# Patient Record
Sex: Male | Born: 1940 | Race: White | Hispanic: No | Marital: Married | Smoking: Former smoker
Health system: Southern US, Community
[De-identification: ages and names within clinical notes are randomized; demographics above are authoritative.]

## PROBLEM LIST (undated history)

## (undated) DIAGNOSIS — Z9189 Other specified personal risk factors, not elsewhere classified: Secondary | ICD-10-CM

## (undated) DIAGNOSIS — I10 Essential (primary) hypertension: Secondary | ICD-10-CM

## (undated) DIAGNOSIS — I2510 Atherosclerotic heart disease of native coronary artery without angina pectoris: Secondary | ICD-10-CM

## (undated) DIAGNOSIS — M1990 Unspecified osteoarthritis, unspecified site: Secondary | ICD-10-CM

## (undated) DIAGNOSIS — R350 Frequency of micturition: Secondary | ICD-10-CM

## (undated) DIAGNOSIS — R351 Nocturia: Secondary | ICD-10-CM

## (undated) DIAGNOSIS — E785 Hyperlipidemia, unspecified: Secondary | ICD-10-CM

## (undated) DIAGNOSIS — N400 Enlarged prostate without lower urinary tract symptoms: Secondary | ICD-10-CM

## (undated) DIAGNOSIS — Z87442 Personal history of urinary calculi: Secondary | ICD-10-CM

## (undated) DIAGNOSIS — I499 Cardiac arrhythmia, unspecified: Secondary | ICD-10-CM

## (undated) DIAGNOSIS — I639 Cerebral infarction, unspecified: Secondary | ICD-10-CM

## (undated) DIAGNOSIS — N4340 Spermatocele of epididymis, unspecified: Secondary | ICD-10-CM

## (undated) DIAGNOSIS — R972 Elevated prostate specific antigen [PSA]: Secondary | ICD-10-CM

## (undated) DIAGNOSIS — R42 Dizziness and giddiness: Secondary | ICD-10-CM

## (undated) DIAGNOSIS — R3915 Urgency of urination: Secondary | ICD-10-CM

## (undated) HISTORY — PX: EYE SURGERY: SHX253

## (undated) HISTORY — PX: INGUINAL HERNIA REPAIR: SUR1180

## (undated) HISTORY — DX: Cerebral infarction, unspecified: I63.9

## (undated) HISTORY — DX: Atherosclerotic heart disease of native coronary artery without angina pectoris: I25.10

## (undated) HISTORY — PX: CORONARY ANGIOPLASTY: SHX604

## (undated) HISTORY — DX: Essential (primary) hypertension: I10

## (undated) HISTORY — PX: FOOT FUSION: SHX956

---

## 1999-12-25 ENCOUNTER — Emergency Department (HOSPITAL_COMMUNITY): Admission: EM | Admit: 1999-12-25 | Discharge: 1999-12-25 | Payer: Self-pay

## 1999-12-25 ENCOUNTER — Encounter: Payer: Self-pay | Admitting: Emergency Medicine

## 2000-01-03 ENCOUNTER — Emergency Department (HOSPITAL_COMMUNITY): Admission: EM | Admit: 2000-01-03 | Discharge: 2000-01-03 | Payer: Self-pay | Admitting: Emergency Medicine

## 2001-09-19 ENCOUNTER — Emergency Department (HOSPITAL_COMMUNITY): Admission: EM | Admit: 2001-09-19 | Discharge: 2001-09-19 | Payer: Self-pay | Admitting: *Deleted

## 2002-08-18 HISTORY — PX: SHOULDER ARTHROSCOPY: SHX128

## 2008-06-02 ENCOUNTER — Ambulatory Visit (HOSPITAL_BASED_OUTPATIENT_CLINIC_OR_DEPARTMENT_OTHER): Admission: RE | Admit: 2008-06-02 | Discharge: 2008-06-02 | Payer: Self-pay | Admitting: General Surgery

## 2008-06-02 HISTORY — PX: UMBILICAL HERNIA REPAIR: SHX196

## 2010-05-02 ENCOUNTER — Ambulatory Visit: Payer: Self-pay | Admitting: Cardiology

## 2010-11-04 ENCOUNTER — Ambulatory Visit: Payer: Self-pay | Admitting: Cardiology

## 2010-11-20 ENCOUNTER — Other Ambulatory Visit: Payer: Self-pay | Admitting: Cardiology

## 2010-11-20 ENCOUNTER — Ambulatory Visit: Payer: Self-pay | Admitting: Cardiology

## 2010-11-20 DIAGNOSIS — I10 Essential (primary) hypertension: Secondary | ICD-10-CM

## 2010-11-20 MED ORDER — AMLODIPINE BESYLATE 10 MG PO TABS: 10.0000 mg | ORAL_TABLET | Freq: Every day | ORAL | Status: DC

## 2010-11-20 NOTE — Telephone Encounter (Signed)
Requesting Amlodipine 10 mg #90 be sent to [facility].

## 2010-11-20 NOTE — Telephone Encounter (Signed)
Called requesting refill on amlodipine sent to [facility]; Faxed

## 2010-12-04 ENCOUNTER — Ambulatory Visit: Payer: Self-pay | Admitting: Cardiology

## 2010-12-18 ENCOUNTER — Ambulatory Visit: Payer: Self-pay | Admitting: Cardiology

## 2010-12-18 ENCOUNTER — Encounter: Payer: Self-pay | Admitting: Cardiology

## 2010-12-18 DIAGNOSIS — I10 Essential (primary) hypertension: Secondary | ICD-10-CM | POA: Insufficient documentation

## 2010-12-18 DIAGNOSIS — I2510 Atherosclerotic heart disease of native coronary artery without angina pectoris: Secondary | ICD-10-CM | POA: Insufficient documentation

## 2010-12-18 DIAGNOSIS — I300 Acute nonspecific idiopathic pericarditis: Secondary | ICD-10-CM | POA: Insufficient documentation

## 2010-12-18 DIAGNOSIS — E7800 Pure hypercholesterolemia, unspecified: Secondary | ICD-10-CM | POA: Insufficient documentation

## 2010-12-18 DIAGNOSIS — R42 Dizziness and giddiness: Secondary | ICD-10-CM | POA: Insufficient documentation

## 2010-12-25 ENCOUNTER — Ambulatory Visit (INDEPENDENT_AMBULATORY_CARE_PROVIDER_SITE_OTHER): Payer: 59 | Admitting: Cardiology

## 2010-12-25 ENCOUNTER — Encounter: Payer: Self-pay | Admitting: Cardiology

## 2010-12-25 VITALS — BP 128/78 | HR 60 | Ht 67.0 in | Wt 170.6 lb

## 2010-12-25 DIAGNOSIS — I2510 Atherosclerotic heart disease of native coronary artery without angina pectoris: Secondary | ICD-10-CM

## 2010-12-25 DIAGNOSIS — E7800 Pure hypercholesterolemia, unspecified: Secondary | ICD-10-CM

## 2010-12-25 DIAGNOSIS — I10 Essential (primary) hypertension: Secondary | ICD-10-CM

## 2010-12-25 NOTE — Assessment & Plan Note (Signed)
His last lipid panel in September was good. Will continue with his Crestor. Will repeat lab work in 6 months.

## 2010-12-25 NOTE — Assessment & Plan Note (Signed)
Blood pressure control is acceptable. We will continue with his current therapy. Continue sodium restriction.

## 2010-12-25 NOTE — Progress Notes (Signed)
   Edward [name] Date of Birth: 10-25-1940   History of Present Illness: Edward [name] is seen for followup today. He continues to do very well. He's had no significant chest pain or shortness of breath. He denies any palpitations. He still has occasional bouts of vertigo that come and go.  Current Outpatient Prescriptions on File Prior to Visit  Medication Sig Dispense Refill  . amLODipine (NORVASC) 10 MG tablet Take 1 tablet (10 mg total) by mouth daily.  90 tablet  3  . aspirin 325 MG tablet Take 325 mg by mouth daily.        Marland Kitchen atenolol (TENORMIN) 50 MG tablet Take 25 mg by mouth 2 (two) times daily.        . fish oil-omega-3 fatty acids 1000 MG capsule Take 1,000 mg by mouth 2 (two) times daily. 2 BID        . lisinopril (PRINIVIL,ZESTRIL) 40 MG tablet Take 20 mg by mouth 2 (two) times daily.        . rosuvastatin (CRESTOR) 10 MG tablet Take 10 mg by mouth daily.          No Known Allergies  Past Medical History  Diagnosis Date  . Coronary artery disease 1995    ANGIOPLASTY THE LAD  . Acute idiopathic pericarditis   . Hypercholesterolemia   . Hypertension   . Vertigo, intermittent     Past Surgical History  Procedure Date  . Hernia repair   . Shoulder arthroscopy     right    History  Smoking status  . Former Smoker  . Quit date: 12/18/1970  Smokeless tobacco  . Not on file    History  Alcohol Use No    No family history on file.  Review of Systems: The review of systems is positive for left shoulder and neck pain. He reports that he may need left shoulder surgery. All other systems were reviewed and are negative.  Physical Exam: BP 128/78  Pulse 60  Ht 5\' 7"  (1.702 m)  Wt 170 lb 9.6 oz (77.384 kg)  BMI 26.72 kg/m2 He is an overweight white male in no acute distress. His HEENT exam is unremarkable. He has no JVD or bruits. Lungs are clear. Cardiac exam reveals a regular rate and rhythm without gallop or murmur. Abdomen is soft and nontender. He has no edema.  Pedal pulses are good. LABORATORY DATA:   Assessment / Plan:

## 2010-12-25 NOTE — Patient Instructions (Addendum)
Stay on same medications.  Stay active and lose a little weight.   We will see you back in 6 months.  We will schedule you for a Stress Echo.

## 2010-12-25 NOTE — Assessment & Plan Note (Signed)
He is asymptomatic. His last stress test was in October of 2008. I recommended a stress echo for followup evaluation. Continue risk factor modification.

## 2010-12-31 NOTE — Op Note (Signed)
NAME:  Edward [name], Edward [name] NO.:  [id number]   MEDICAL RECORD NO.:  [id number]          PATIENT TYPE:  AMB   LOCATION:  DSC                          FACILITY:  [facility]   PHYSICIAN:  Juanetta Gosling, MDDATE OF BIRTH:  06-22-1941   DATE OF PROCEDURE:  06/02/2008  DATE OF DISCHARGE:                               OPERATIVE REPORT   PREOPERATIVE DIAGNOSIS:  Umbilical hernia.   POSTOPERATIVE DIAGNOSIS:  Umbilical hernia.   PROCEDURE:  Primary umbilical hernia repair.   SURGEON:  Troy Sine. Dwain Sarna, MD   ASSISTANT:  None.   ANESTHESIA:  General with LMA.   FINDINGS:  Less than 1 cm defect.   SPECIMEN:  None.   DRAINS:  None.   COMPLICATIONS:  None.   ESTIMATED BLOOD LOSS:  Minimal.   DISPOSITION:  To PACU in stable condition.   HISTORY:  Mr. Birkeland is a 70 year old male with a symptomatic umbilical  hernia who I counseled for an open repair.   PROCEDURE:  After informed consent was obtained, the patient was taken  to the operating room.  He was administered 1 g of intravenous Ancef.  Sequential compression devices were placed on his lower extremities  throughout the operation.  He was then placed under general anesthesia  with an LMA.  His abdomen was then prepped and draped in the standard  sterile surgical fashion.  A surgical time-out was then performed.  A  curvilinear infraumbilical incision was then made and dissection was  carried out down to the level of the umbilical stalk.  This was then  encircled with a Kelly clamp and divided with electrocautery.  When the  hernia was completely reduced, it was noted to have a less than 1 cm  defect.  He had a fair amount of preperitoneal fat protruding through  this which was why I think he was so symptomatic.  Upon reduction of  this, the edges were then cleaned off with electrocautery.  2 0-Ethibond  figure-of-eight sutures and then 1 simple suture through the middle were  then performed and tied down.   There was no further defect noted.  This  was closed without any tension.  Hemostasis was observed.  The umbilicus  was then tacked down with a 4-0 undyed Vicryl.  Skin was closed with a 4-  0 Monocryl.  Steri-Strips and sterile dressing were placed over this.  A 10 mL of  0.25% Marcaine without epinephrine were then placed into the wound  following completion.  He tolerated this well, was extubated in the  operating room, and was then transferred to PACU in stable condition.      Juanetta Gosling, MD  Electronically Signed     MCW/MEDQ  D:  06/02/2008  T:  06/03/2008  Job:  [id number]   cc:   Larina Earthly, M.D.  Peter M. [place], M.D.

## 2011-01-01 ENCOUNTER — Other Ambulatory Visit (HOSPITAL_COMMUNITY): Payer: 59 | Admitting: Radiology

## 2011-01-03 NOTE — Consult Note (Signed)
[facility]  Patient:    Edward [name], Edward [name] Visit Number: [id number] MRN: [id number]          Service Type: EMS Location: [facility] Attending Physician:  Corlis Leak. Dictated by:   Alvia Grove., M.D. Proc. Date: 09/19/01 Admit Date:  09/19/2001   CC:         Peter M. [place], M.D.  Lenon Curt Cassell Clement, M.D.   Consultation Report  HISTORY OF PRESENT ILLNESS:  Edward [name] is a middle-aged gentleman with a history of coronary artery disease, status post PTCA and stenting of his left anterior descending artery.  He presented to the emergency room with episodes of indigestion and chest pain.  The patient has a history of coronary artery disease with PTCA and stenting of his LAD back in 1994.  He had a repeat heart catheterization in 1999 for episodes of chest pain and was found to have relatively smooth and normal coronaries at that time.  He has done quite well.  In the past week or so, he has been helping his sister paint her house.  He has had some episodes of indigestion.  He has not had any diaphoresis and no dyspnea.  He states that these episodes of chest pain were completely different from his previous episodes of angina.  He has also noted that his blood pressure was slightly higher today.  The pain lasted for approximately 30 minutes.  It was finally relieved with a GI cocktail here in the emergency room.  He has not had any chest pain or chest tightness.  He has not had any associated radiation.  He has not had any shortness of breath or nausea or vomiting.  CURRENT MEDICATIONS: 1. Accupril 20 mg a day. 2. Zocor 40 mg a day. 3. Atenolol 25 mg a day. 4. Nitroglycerin as needed.  ALLERGIES:  No known drug allergies.  PAST MEDICAL HISTORY: 1. Coronary artery disease. 2. Hypertension.  SOCIAL HISTORY:  The patient has a history of smoking in the remote past.  He drinks alcohol only rarely.  FAMILY HISTORY:  Positive  for coronary artery disease.  REVIEW OF SYSTEMS:  Was reviewed and is essentially negative.  PHYSICAL EXAMINATION:  GENERAL:  This is a middle-aged gentleman in no acute distress.  VITAL SIGNS:  His initial blood pressure was 165/104; later it was 165/90. Heart rate is 76.  NECK:  Examination reveals 2+ carotids.  He has no bruits.  There is no JVD.  LUNGS:  Clear to auscultation.  HEART:  Regular rate, S1, S2.  His PMI is nondisplaced.  ABDOMEN:  Good bowel sounds.  He has no hepatosplenomegaly and no areas of tenderness.  EXTREMITIES:  He has no calf tenderness.   There is no clubbing, cyanosis, or edema.  His pulses are intact.  NEUROLOGIC:  Cranial nerves II-XII intact.  His motor and sensory functions are intact.  LABORATORY DATA:  His EKG reveals normal sinus rhythm.  He has no ST or T wave changes.  Laboratory data is pending.  IMPRESSION:  Edward [name] presents with episode of indigestion that is relieve with a gastrointestinal cocktail.  He states that these episodes of pain were not at all similar to his previous episodes of chest pain.  We will discharge him to home.  He will call the office tomorrow morning for an appointment.  I have asked him to call me right away if he has any further episodes of pain. We will give  him a prescription for Prevacid 30 mg which he is to take tonight and then daily.  I have given him a refill for his nitroglycerin.  I have asked him to increase his Accupril up to 40 mg a day.  He has a prescription for Lisinopril which he will be substituting as soon as he runs out of his Accupril.  He will return to the office to see Dr. Swaziland this week. Dictated by:   Alvia Grove., M.D. Attending Physician:  Corlis Leak DD:  09/19/01 TD:  09/20/01 Job: [id number]

## 2011-01-17 ENCOUNTER — Ambulatory Visit (HOSPITAL_COMMUNITY): Payer: Medicare Other | Attending: Cardiology | Admitting: Radiology

## 2011-01-17 ENCOUNTER — Other Ambulatory Visit (HOSPITAL_COMMUNITY): Payer: 59 | Admitting: Radiology

## 2011-01-17 DIAGNOSIS — I2510 Atherosclerotic heart disease of native coronary artery without angina pectoris: Secondary | ICD-10-CM | POA: Insufficient documentation

## 2011-01-20 ENCOUNTER — Telehealth: Payer: Self-pay | Admitting: *Deleted

## 2011-01-20 NOTE — Telephone Encounter (Signed)
Notified of stress echo results. Will send copy to Dr. Felipa Eth

## 2011-01-20 NOTE — Telephone Encounter (Signed)
Message copied by Lorayne Bender on Mon Jan 20, 2011  4:01 PM ------      Message from: [place], PETER M      Created: Fri Jan 17, 2011  5:29 PM       Stress Echo is normal. Way to go!

## 2011-02-10 ENCOUNTER — Other Ambulatory Visit: Payer: Self-pay | Admitting: Cardiology

## 2011-02-10 MED ORDER — ROSUVASTATIN CALCIUM 10 MG PO TABS: 10.0000 mg | ORAL_TABLET | Freq: Every day | ORAL | Status: DC

## 2011-02-10 NOTE — Telephone Encounter (Signed)
escribe medication per fax request  

## 2011-02-10 NOTE — Telephone Encounter (Signed)
Called in asking for you to reorder his Crestor. Asked to have the refill faxed to [facility]. I have pulled the chart.

## 2011-04-22 ENCOUNTER — Telehealth: Payer: Self-pay | Admitting: Cardiology

## 2011-04-22 NOTE — Telephone Encounter (Signed)
Call Back Phone#: [phone] Last OV, EKG, STRESS, ECHO °

## 2011-04-23 ENCOUNTER — Telehealth: Payer: Self-pay | Admitting: Cardiology

## 2011-04-23 NOTE — Telephone Encounter (Signed)
Faxed last OV Note, EKG, Stress.  No ECHO available.  Other info faxed today.

## 2011-04-23 NOTE — Telephone Encounter (Signed)
Wanted to let us know he is having shoulder surgery Monday and was advised to stop ASA and Fish Oil 5 days prior. OK per Dr. Swaziland

## 2011-04-23 NOTE — Telephone Encounter (Signed)
Pt states he is scheduled for shoulder surgery on Monday and pt is instructed to temporarily stop taking aspirin and fish oil 4-5 days prior.  Pt just wanted to inform our office.

## 2011-04-28 ENCOUNTER — Ambulatory Visit (HOSPITAL_BASED_OUTPATIENT_CLINIC_OR_DEPARTMENT_OTHER)
Admission: RE | Admit: 2011-04-28 | Discharge: 2011-04-28 | Disposition: A | Discharge status: Home / Self Care | Payer: Medicare Other | Source: Ambulatory Visit | Attending: Orthopedic Surgery | Admitting: Orthopedic Surgery

## 2011-04-28 DIAGNOSIS — M75110 Incomplete rotator cuff tear or rupture of unspecified shoulder, not specified as traumatic: Secondary | ICD-10-CM | POA: Insufficient documentation

## 2011-04-28 DIAGNOSIS — M25819 Other specified joint disorders, unspecified shoulder: Secondary | ICD-10-CM | POA: Insufficient documentation

## 2011-04-28 DIAGNOSIS — M66329 Spontaneous rupture of flexor tendons, unspecified upper arm: Secondary | ICD-10-CM | POA: Insufficient documentation

## 2011-04-28 HISTORY — PX: SHOULDER ARTHROSCOPY WITH DEBRIDEMENT AND BICEP TENDON REPAIR: SHX5690

## 2011-04-28 LAB — POCT HEMOGLOBIN-HEMACUE: Hemoglobin: 14.4 g/dL (ref 13.0–17.0)

## 2011-05-06 NOTE — Op Note (Signed)
NAME:  Edward [name], Edward [name] NO.:  [id number]  MEDICAL RECORD NO.:  [id number]  LOCATION:  [id number]                     FACILITY:  [facility]  PHYSICIAN:  Jones Broom, MD    DATE OF BIRTH:  [birth year]  DATE OF PROCEDURE:  04/28/2011 DATE OF DISCHARGE:  01/17/2011                              OPERATIVE REPORT   PREOPERATIVE DIAGNOSES: 1. Left shoulder rotator cuff tear. 2. Left shoulder impingement.  POSTOPERATIVE DIAGNOSES: 1. Left shoulder irreparable supraspinatus tear. 2. Left shoulder impingement. 3. Partial-thickness tear undersurface subscapularis. 4. Tear of the proximal long head biceps tendon sheath.  PROCEDURES PERFORMED: 1. Left shoulder arthroscopic extensive debridement of irreparable     supraspinatus tear undersurface subscapularis partial tear and     biceps tenotomy for long head biceps tear. 2. Arthroscopic subacromial decompression.  ATTENDING SURGEON:  Jones Broom, MD  ASSISTANT:  None.  ANESTHESIA:  GETA with preoperative interscalene block.  COMPLICATIONS:  None.  DRAINS:  None.  SPECIMENS:  None.  ESTIMATED BLOOD LOSS:  Minimal.  INDICATIONS FOR SURGERY:  The patient is a 70 year old gentleman with several years of left shoulder pain which has failed nonoperative management in the form of injections, exercises, and antiinflammatory medications.  He had significant night pain, keeping him awake, and pain with activity.  He had an MRI which revealed a full-thickness anterior supraspinatus tear with significant retraction and some atrophy.  He failed conservative management and wished to go forward with surgical management.  He understood risks, benefits, alternatives to surgery including but not limited to risk of bleeding, infection, damage to neurovascular structures, risk of this tear would not be repairable, but likely still get significant benefit from surgery.  We talked about preoperatively what to do with his biceps  tendon more significantly involved and he wished to have biceps tenotomy arthroscopically.  He understood risks, benefits, and alternatives, and wished to go forward with surgery.  OPERATIVE FINDINGS:  Examination under anesthesia demonstrated no stiffness or instability.  Diagnostic arthroscopy revealed significant tearing of the biceps root and about 50% tearing of the proximal long head biceps tendon.  Therefore, biceps tenotomy was performed. Glenohumeral joint surfaces were intact with no significant arthritis. No loose bodies were noted.  Posterior rotator cuff was intact, but superiorly he had significant tearing with some small strands of tendon which were not attached medially, were floating laterally, and these were all debrided.  He did have irregularity of the posterior humeral head cartilage, but no full-thickness cartilage defects.  The subscapularis was partially torn on the undersurface and this was debrided.  He had significant tearing and degeneration of the coracoacromial ligament, indicating chronic impingement.  In the subacromial space, the rotator cuff was examined and debrided of non- healthy tissue and bursa.  After debridement, attempt was made to try and reduce the tendon and I was unable to bring any even to the articular surface.  He had a large anterior acromial spur which was taken down with standard acromioplasty.  The cuff edge was debrided back to a stable base and all debris and unhealthy, nonviable appearing tissue in the joint was debrided.  DESCRIPTION OF PROCEDURE:  The patient was identified in the preoperative holding area  where I personally marked the operative site after verifying site side and procedure with the patient.  He was taken back to the operating room where general anesthesia was induced without complication.  He did have a preoperative interscalene block given by the attending anesthesiologist.  He was placed in a beach-chair  position with all extremities carefully padded in position and the left upper extremity was then prepped and draped in a standard sterile fashion. After the appropriate time-out procedure, a standard posterior portal was established and the arthroscope was introduced in the joint.  An anterior portal was established above the subscapularis with needle localization.  Diagnostic arthroscopy was then carried out with findings as described above.  The shaver was used to the anterior portal and debride down the partially torn and detached superior labrum at the biceps root and probe was used to bring the tendon into the joint. There was noted to be significant tearing of the proximal biceps tendon of about 50%.  Therefore, biceps tenotomy was performed with a large biter.  The remaining superior labrum was debrided back to the stable base.  The subscapularis was examined, found to have some partial- thickness undersurface tearing, but no full-thickness tearing.  This was debrided down to stable base.  The glenohumeral joint surfaces were examined and found to be intact.  The posterior superior humeral head did have small rift in the cartilage, but no full-thickness cartilage loss.  No loose bodies were noted in the axillary recess.  The posterior rotator cuff was intact, but superiorly is noted to have full-thickness tearing with a lot of debris and tendon remnant on the greater tuberosity which was debrided.  There were small strands of tendon which were debrided away.  The arthroscope was then introduced in the subacromial space and when viewed from the top, this tear was noted to be fairly large and quite retracted away from the tuberosity.  After debriding away, very unhealthy and nonviable appearing tendon edge back to somewhat more robust tendon.  I attempted to try and reduce the tendon over to the tuberosity and was not even able to bring in half way.  After some gentle releases and  there was no improvement, I felt that this tear was really not repairable at this time and likely was a very chronic tear.  Therefore, I debrided the tendon edge back to healthy appearing tissue and the tissue that it would not impinge or cause mechanical symptoms and debrided the tuberosity down to bone.  The coracoacromial ligament was very thickened and was significant fraying. I took the coracoacromial ligament off the anterior acromion using the ArthroCare, exposing a large anterior acromial spur.  The 4-mm bur was then used to perform a standard acromioplasty and the arthroscope was then introduced into the lateral portal to view the acromioplasty from the lateral side.  The acromion was perfectly flat posterior to anterior.  The posterior rotator cuff was intact but anteriorly, the supraspinatus was torn and retracted non-repairable.  After I felt that I had debrided the subacromial space adequately, not to cause any further crepitance or pain in the joint.  I ran the shaver in the joint, removing excess bone dust from the acromioplasty and then the arthroscopic equipment was removed from the joint and portals were closed with 3-0 nylon interrupted fashion.  Sterile dressings were then applied including Xeroform, 4x4s, ABDs, and tape.  The patient was allowed to awaken from general anesthesia, transferred to the stretcher, and taken to the recovery  room in stable condition.  POSTOPERATIVE PLAN:  He will be discharged home today with his wife.  He will be in a sling over the next 48 hours and will begin some gentle active and passive motion exercises.  He will follow up with me in 1 week for suture removal and wound check.     Jones Broom, MD     JC/MEDQ  D:  04/28/2011  T:  04/28/2011  Job:  [id number]  Electronically Signed by Jones Broom  on 05/06/2011 11:34:31 PM

## 2011-05-19 LAB — BASIC METABOLIC PANEL
BUN: 10
Chloride: 108
GFR calc Af Amer: >60
Potassium: 4.4
Sodium: 138

## 2011-07-02 ENCOUNTER — Encounter: Payer: Self-pay | Admitting: Cardiology

## 2011-07-18 ENCOUNTER — Telehealth: Payer: Self-pay | Admitting: Cardiology

## 2011-07-18 MED ORDER — LISINOPRIL 40 MG PO TABS: 20.0000 mg | ORAL_TABLET | Freq: Two times a day (BID) | ORAL | Status: DC

## 2011-07-18 NOTE — Telephone Encounter (Signed)
New problem Pt wants a refill for lisinipril sent to [facility]

## 2011-07-30 ENCOUNTER — Other Ambulatory Visit: Payer: Self-pay | Admitting: Cardiology

## 2011-07-30 MED ORDER — LISINOPRIL 40 MG PO TABS: 20.0000 mg | ORAL_TABLET | Freq: Two times a day (BID) | ORAL | Status: DC

## 2011-07-30 NOTE — Telephone Encounter (Signed)
FU Call: This is the second time pt has called asking for refill.

## 2011-10-02 ENCOUNTER — Other Ambulatory Visit: Payer: Self-pay | Admitting: Cardiology

## 2011-10-03 MED ORDER — ATENOLOL 50 MG PO TABS: 25.0000 mg | ORAL_TABLET | Freq: Two times a day (BID) | ORAL | Status: DC

## 2011-10-10 ENCOUNTER — Telehealth: Payer: Self-pay | Admitting: Cardiology

## 2011-10-10 MED ORDER — ATENOLOL 50 MG PO TABS: 25.0000 mg | ORAL_TABLET | Freq: Two times a day (BID) | ORAL | Status: DC

## 2011-10-10 NOTE — Telephone Encounter (Signed)
REFILLED MEDICATION ON 10/03/11 TO [facility]. IT USUALLY TAKES ABOUT A WEEK BEFORE HE GETS THE PRESCRIPTION IN THE MAIL. SO WE WILL REFILL IT AGAIN, PER THE PATIENT, BUT IT PROBABLY WILL ARRIVE TODAY OR TOMORROW

## 2011-10-10 NOTE — Telephone Encounter (Signed)
New msg Pt said [facility] has not sent him refill of atenolol please resend and let him know

## 2011-12-10 ENCOUNTER — Telehealth: Payer: Self-pay

## 2011-12-10 MED ORDER — AMLODIPINE BESYLATE 10 MG PO TABS: 10.0000 mg | ORAL_TABLET | Freq: Every day | ORAL | Status: DC

## 2011-12-10 NOTE — Telephone Encounter (Signed)
Patient walked in office requesting refill for amlodipine 10 mg.Prescription sent to [facility].

## 2011-12-15 ENCOUNTER — Telehealth: Payer: Self-pay | Admitting: Cardiology

## 2011-12-15 NOTE — Telephone Encounter (Signed)
LOV,Stress Echo faxed to Dr.Tilley's Office @ [phone] 12/15/11/Km

## 2012-01-20 ENCOUNTER — Ambulatory Visit: Payer: Medicare Other | Admitting: Cardiology

## 2013-01-24 ENCOUNTER — Other Ambulatory Visit: Payer: Self-pay | Admitting: Urology

## 2013-02-23 ENCOUNTER — Encounter (HOSPITAL_BASED_OUTPATIENT_CLINIC_OR_DEPARTMENT_OTHER): Payer: Self-pay | Admitting: *Deleted

## 2013-02-23 NOTE — Progress Notes (Signed)
NPO AFTER MN. ARRIVES AT 0815. NEEDS ISTAT. CURRENT EKG, LOV NOTE AND STRESS TEST TO BE FAXED FROM DR Donnie Aho. WILL TAKE LOSARTAN AM OF SURG W/ SIP OF WATER.

## 2013-02-28 NOTE — H&P (Signed)
  H&P   History of Present Illness: H/o kidney stones. Comparing images from Jun 2011 through [facility] Jun 2014 and CT Jan 2014 - appears to be a poorly progressing stone at left UVJ (the more medial, triangular calculus).  He has quite a few pelvic phleboliths which makes comparison difficult. He presents for exam under anesthesia (h/o elevated PSA), cystoscopy, left retrograde pyelogram, poss left ureteroscopy, laser lithotripsy and stent placement. He has been well with no fever or dysuria. He reports some left flank discomfort 2-3 weeks ago - mild. He's seen no stone passage.  Past Medical History  Diagnosis Date  . Hypertension   . Hyperlipidemia   . Left ureteral calculus   . History of kidney stones   . BPH (benign prostatic hypertrophy)   . Elevated PSA   . Arthritis   . Coronary artery disease CARIOLOGIST-- DR Donnie Aho    ANGIOPLASTY THE LAD  . Frequency of urination   . Urgency of urination   . Nocturia    Past Surgical History  Procedure Laterality Date  . Umbilical hernia repair  06-02-2008  . Shoulder arthroscopy with debridement and bicep tendon repair Left 04-28-2011  . Inguinal hernia repair Bilateral   . Shoulder arthroscopy Right 2004  . Foot fusion Left     SECONDARY TO FX'S  . Coronary angioplasty  1995  DR Swaziland    LAD    Home Medications:  No prescriptions prior to admission   Allergies: No Known Allergies  History reviewed. No pertinent family history. Social History:  reports that he quit smoking about 42 years ago. His smoking use included Cigarettes. He has a 1.5 pack-year smoking history. He has never used smokeless tobacco. He reports that  drinks alcohol. He reports that he does not use illicit drugs.  ROS: A complete review of systems was performed.  All systems are negative except for pertinent findings as noted. @ROS @   Physical Exam:  Vital signs in last 24 hours:   General:  Alert and oriented, No acute distress HEENT: Normocephalic,  atraumatic Neck: No JVD or lymphadenopathy Cardiovascular: Regular rate and rhythm Lungs: Regular rate and effort Abdomen: Soft, nontender, nondistended, no abdominal masses Back: No CVA tenderness Extremities: No edema Neurologic: Grossly intact  Laboratory Data:  No results found for this or any previous visit (from the past 24 hour(s)). No results found for this or any previous visit (from the past 240 hour(s)). Creatinine: No results found for this basename: CREATININE,  in the last 168 hours  Impression/Assessment:  Nephrolithiasis Ureteral stone Microhematuria Elevated PSA  Plan:  I discussed with the patient the nature, potential benefits, risks and alternatives to exam under anesthesia, cystoscopy, left retrograde pyelogram, poss left ureteroscopy, laser lithotripsy and stent placement, including side effects of the proposed treatment, the likelihood of the patient achieving the goals of the procedure, and any potential problems that might occur during the procedure or recuperation. All questions answered. Patient elects to proceed.    Antony Haste

## 2013-03-01 ENCOUNTER — Encounter (HOSPITAL_BASED_OUTPATIENT_CLINIC_OR_DEPARTMENT_OTHER): Payer: Self-pay | Admitting: Anesthesiology

## 2013-03-01 ENCOUNTER — Ambulatory Visit (HOSPITAL_COMMUNITY): Payer: Medicare Other

## 2013-03-01 ENCOUNTER — Encounter (HOSPITAL_BASED_OUTPATIENT_CLINIC_OR_DEPARTMENT_OTHER)
Admission: RE | Disposition: A | Discharge status: Home / Self Care | Payer: Self-pay | Source: Ambulatory Visit | Attending: Urology

## 2013-03-01 ENCOUNTER — Ambulatory Visit (HOSPITAL_BASED_OUTPATIENT_CLINIC_OR_DEPARTMENT_OTHER): Payer: Medicare Other | Admitting: Anesthesiology

## 2013-03-01 ENCOUNTER — Ambulatory Visit (HOSPITAL_BASED_OUTPATIENT_CLINIC_OR_DEPARTMENT_OTHER)
Admission: RE | Admit: 2013-03-01 | Discharge: 2013-03-01 | Disposition: A | Discharge status: Home / Self Care | Payer: Medicare Other | Source: Ambulatory Visit | Attending: Urology | Admitting: Urology

## 2013-03-01 DIAGNOSIS — N201 Calculus of ureter: Secondary | ICD-10-CM | POA: Insufficient documentation

## 2013-03-01 DIAGNOSIS — N200 Calculus of kidney: Secondary | ICD-10-CM | POA: Insufficient documentation

## 2013-03-01 DIAGNOSIS — R3129 Other microscopic hematuria: Secondary | ICD-10-CM | POA: Insufficient documentation

## 2013-03-01 DIAGNOSIS — Z87891 Personal history of nicotine dependence: Secondary | ICD-10-CM | POA: Insufficient documentation

## 2013-03-01 DIAGNOSIS — I2510 Atherosclerotic heart disease of native coronary artery without angina pectoris: Secondary | ICD-10-CM | POA: Insufficient documentation

## 2013-03-01 DIAGNOSIS — E785 Hyperlipidemia, unspecified: Secondary | ICD-10-CM | POA: Insufficient documentation

## 2013-03-01 DIAGNOSIS — N400 Enlarged prostate without lower urinary tract symptoms: Secondary | ICD-10-CM | POA: Insufficient documentation

## 2013-03-01 DIAGNOSIS — R972 Elevated prostate specific antigen [PSA]: Secondary | ICD-10-CM | POA: Insufficient documentation

## 2013-03-01 DIAGNOSIS — I10 Essential (primary) hypertension: Secondary | ICD-10-CM | POA: Insufficient documentation

## 2013-03-01 HISTORY — DX: Unspecified osteoarthritis, unspecified site: M19.90

## 2013-03-01 HISTORY — DX: Hyperlipidemia, unspecified: E78.5

## 2013-03-01 HISTORY — DX: Nocturia: R35.1

## 2013-03-01 HISTORY — PX: CYSTOSCOPY WITH RETROGRADE PYELOGRAM, URETEROSCOPY AND STENT PLACEMENT: SHX5789

## 2013-03-01 HISTORY — DX: Elevated prostate specific antigen (PSA): R97.20

## 2013-03-01 HISTORY — DX: Frequency of micturition: R35.0

## 2013-03-01 HISTORY — DX: Urgency of urination: R39.15

## 2013-03-01 HISTORY — DX: Personal history of urinary calculi: Z87.442

## 2013-03-01 HISTORY — DX: Benign prostatic hyperplasia without lower urinary tract symptoms: N40.0

## 2013-03-01 LAB — POCT I-STAT, CHEM 8
BUN: 15 mg/dL (ref 6–23)
Calcium, Ion: 0.99 mmol/L — ABNORMAL LOW (ref 1.13–1.30)
Chloride: 110 mEq/L (ref 96–112)
Creatinine, Ser: 1 mg/dL (ref 0.50–1.35)
TCO2: 21 mmol/L (ref 0–100)

## 2013-03-01 SURGERY — CYSTOURETEROSCOPY, WITH RETROGRADE PYELOGRAM AND STENT INSERTION
Anesthesia: General | Laterality: Left

## 2013-03-01 MED ORDER — CEPHALEXIN 500 MG PO CAPS: 500.0000 mg | ORAL_CAPSULE | Freq: Every day | ORAL | Status: DC

## 2013-03-01 MED ORDER — MIDAZOLAM HCL 5 MG/5ML IJ SOLN
INTRAMUSCULAR | Status: DC | PRN
  Administered 2013-03-01: 2 mg via INTRAVENOUS

## 2013-03-01 MED ORDER — DEXAMETHASONE SODIUM PHOSPHATE 4 MG/ML IJ SOLN
INTRAMUSCULAR | Status: DC | PRN
  Administered 2013-03-01: 10 mg via INTRAVENOUS

## 2013-03-01 MED ORDER — BELLADONNA ALKALOIDS-OPIUM 16.2-60 MG RE SUPP
RECTAL | Status: DC | PRN
  Administered 2013-03-01: 1 via RECTAL

## 2013-03-01 MED ORDER — IOHEXOL 350 MG/ML SOLN
INTRAVENOUS | Status: DC | PRN
  Administered 2013-03-01: 2 mL

## 2013-03-01 MED ORDER — PROPOFOL 10 MG/ML IV BOLUS
INTRAVENOUS | Status: DC | PRN
  Administered 2013-03-01: 240 mg via INTRAVENOUS

## 2013-03-01 MED ORDER — CEFAZOLIN SODIUM 1-5 GM-% IV SOLN
1.0000 g | INTRAVENOUS | Status: DC
  Filled 2013-03-01: qty 50

## 2013-03-01 MED ORDER — FENTANYL CITRATE 0.05 MG/ML IJ SOLN
25.0000 ug | INTRAMUSCULAR | Status: DC | PRN
  Administered 2013-03-01 (×2): 25 ug via INTRAVENOUS
  Filled 2013-03-01: qty 1

## 2013-03-01 MED ORDER — CEFAZOLIN SODIUM-DEXTROSE 2-3 GM-% IV SOLR
2.0000 g | INTRAVENOUS | Status: AC
  Administered 2013-03-01: 2 g via INTRAVENOUS
  Filled 2013-03-01: qty 50

## 2013-03-01 MED ORDER — ONDANSETRON HCL 4 MG/2ML IJ SOLN
INTRAMUSCULAR | Status: DC | PRN
  Administered 2013-03-01: 4 mg via INTRAVENOUS

## 2013-03-01 MED ORDER — HYDROCODONE-ACETAMINOPHEN 5-325 MG PO TABS: 1.0000 | ORAL_TABLET | Freq: Four times a day (QID) | ORAL | Status: DC | PRN

## 2013-03-01 MED ORDER — PROMETHAZINE HCL 25 MG/ML IJ SOLN
6.2500 mg | INTRAMUSCULAR | Status: DC | PRN
  Filled 2013-03-01: qty 1

## 2013-03-01 MED ORDER — LACTATED RINGERS IV SOLN
INTRAVENOUS | Status: DC
  Administered 2013-03-01 (×2): via INTRAVENOUS
  Filled 2013-03-01: qty 1000

## 2013-03-01 MED ORDER — KETOROLAC TROMETHAMINE 30 MG/ML IJ SOLN
15.0000 mg | Freq: Once | INTRAMUSCULAR | Status: DC | PRN
  Filled 2013-03-01: qty 1

## 2013-03-01 MED ORDER — LIDOCAINE HCL (CARDIAC) 20 MG/ML IV SOLN
INTRAVENOUS | Status: DC | PRN
  Administered 2013-03-01: 80 mg via INTRAVENOUS

## 2013-03-01 MED ORDER — TAMSULOSIN HCL 0.4 MG PO CAPS: 0.4000 mg | ORAL_CAPSULE | Freq: Every day | ORAL | Status: DC

## 2013-03-01 MED ORDER — SODIUM CHLORIDE 0.9 % IR SOLN
Status: DC | PRN
  Administered 2013-03-01: 6000 mL

## 2013-03-01 MED ORDER — FENTANYL CITRATE 0.05 MG/ML IJ SOLN
INTRAMUSCULAR | Status: DC | PRN
  Administered 2013-03-01 (×2): 50 ug via INTRAVENOUS

## 2013-03-01 SURGICAL SUPPLY — 42 items
ADAPTER CATH URET PLST 4-6FR (CATHETERS) IMPLANT
ADPR CATH URET STRL DISP 4-6FR (CATHETERS)
BAG DRAIN URO-CYSTO SKYTR STRL (DRAIN) ×2 IMPLANT
BAG DRN UROCATH (DRAIN) ×1
BASKET LASER NITINOL 1.9FR (BASKET) IMPLANT
BASKET STNLS GEMINI 4WIRE 3FR (BASKET) ×1 IMPLANT
BASKET ZERO TIP NITINOL 2.4FR (BASKET) IMPLANT
BRUSH URET BIOPSY 3F (UROLOGICAL SUPPLIES) IMPLANT
BSKT STON RTRVL 120 1.9FR (BASKET)
BSKT STON RTRVL GEM 120X11 3FR (BASKET) ×1
BSKT STON RTRVL ZERO TP 2.4FR (BASKET)
CANISTER SUCT LVC 12 LTR MEDI- (MISCELLANEOUS) ×1 IMPLANT
CATH INTERMIT  6FR 70CM (CATHETERS) ×1 IMPLANT
CATH URET 5FR 28IN CONE TIP (BALLOONS)
CATH URET 5FR 28IN OPEN ENDED (CATHETERS) IMPLANT
CATH URET 5FR 70CM CONE TIP (BALLOONS) IMPLANT
CATH URET DUAL LUMEN 6-10FR 50 (CATHETERS) ×1 IMPLANT
CLOTH BEACON ORANGE TIMEOUT ST (SAFETY) ×2 IMPLANT
DRAPE CAMERA CLOSED 9X96 (DRAPES) ×1 IMPLANT
ELECT REM PT RETURN 9FT ADLT (ELECTROSURGICAL) ×2
ELECTRODE REM PT RTRN 9FT ADLT (ELECTROSURGICAL) IMPLANT
GLOVE BIO SURGEON STRL SZ7 (GLOVE) ×1 IMPLANT
GLOVE BIO SURGEON STRL SZ7.5 (GLOVE) ×2 IMPLANT
GLOVE INDICATOR 7.0 STRL GRN (GLOVE) ×2 IMPLANT
GOWN PREVENTION PLUS LG XLONG (DISPOSABLE) ×2 IMPLANT
GOWN STRL REIN XL XLG (GOWN DISPOSABLE) ×2 IMPLANT
GUIDEWIRE 0.038 PTFE COATED (WIRE) ×2 IMPLANT
GUIDEWIRE ANG ZIPWIRE 038X150 (WIRE) ×1 IMPLANT
GUIDEWIRE STR DUAL SENSOR (WIRE) ×2 IMPLANT
IV NS IRRIG 3000ML ARTHROMATIC (IV SOLUTION) ×4 IMPLANT
KIT BALLIN UROMAX 15FX10 (LABEL) IMPLANT
KIT BALLN UROMAX 15FX4 (MISCELLANEOUS) IMPLANT
KIT BALLN UROMAX 26 75X4 (MISCELLANEOUS)
LASER FIBER DISP (UROLOGICAL SUPPLIES) IMPLANT
PACK CYSTOSCOPY (CUSTOM PROCEDURE TRAY) ×2 IMPLANT
SET HIGH PRES BAL DIL (LABEL)
SHEATH ACCESS URETERAL 38CM (SHEATH) IMPLANT
SHEATH ACCESS URETERAL 54CM (SHEATH) IMPLANT
SHEATH URET ACCESS 12FR/35CM (UROLOGICAL SUPPLIES) IMPLANT
SHEATH URET ACCESS 12FR/55CM (UROLOGICAL SUPPLIES) IMPLANT
STENT URET 6FRX26 CONTOUR (STENTS) ×1 IMPLANT
SYRINGE IRR TOOMEY STRL 70CC (SYRINGE) IMPLANT

## 2013-03-01 NOTE — Anesthesia Preprocedure Evaluation (Addendum)
Anesthesia Evaluation  Patient identified by MRN, date of birth, ID band Patient awake    Reviewed: Allergy & Precautions, H&P , NPO status , Patient's Chart, lab work & pertinent test results  Airway Mallampati: II TM Distance: >3 FB Neck ROM: Full    Dental no notable dental hx.    Pulmonary neg pulmonary ROS,  breath sounds clear to auscultation  Pulmonary exam normal       Cardiovascular hypertension, Pt. on medications + CAD Rhythm:Regular Rate:Normal  Coronary artery disease  1995       ANGIOPLASTY THE LAD      Neuro/Psych negative neurological ROS  negative psych ROS   GI/Hepatic negative GI ROS, Neg liver ROS,   Endo/Other  negative endocrine ROS  Renal/GU negative Renal ROS  negative genitourinary   Musculoskeletal negative musculoskeletal ROS (+)   Abdominal   Peds negative pediatric ROS (+)  Hematology negative hematology ROS (+)   Anesthesia Other Findings   Reproductive/Obstetrics negative OB ROS                          Anesthesia Physical Anesthesia Plan  ASA: III  Anesthesia Plan: General   Post-op Pain Management:    Induction: Intravenous  Airway Management Planned: LMA  Additional Equipment:   Intra-op Plan:   Post-operative Plan:   Informed Consent: I have reviewed the patients History and Physical, chart, labs and discussed the procedure including the risks, benefits and alternatives for the proposed anesthesia with the patient or authorized representative who has indicated his/her understanding and acceptance.   Dental advisory given  Plan Discussed with: CRNA and Surgeon  Anesthesia Plan Comments:         Anesthesia Quick Evaluation

## 2013-03-01 NOTE — Transfer of Care (Signed)
Immediate Anesthesia Transfer of Care Note  Patient: Edward [name]  Procedure(s) Performed: Procedure(s): CYSTOSCOPY WITH LEFT RETROGRADE PYELOGRAM, LEFT URETEROSCOPY and stent PLACEMENT (Left)  Patient Location: PACU  Anesthesia Type:General  Level of Consciousness: sedated and responds to stimulation  Airway & Oxygen Therapy: Patient Spontanous Breathing and Patient connected to nasal cannula oxygen  Post-op Assessment: Report given to PACU RN  Post vital signs: Reviewed and stable  Complications: No apparent anesthesia complications

## 2013-03-01 NOTE — Anesthesia Procedure Notes (Signed)
Procedure Name: LMA Insertion Date/Time: 03/01/2013 9:57 AM Performed by: Maris Berger T Pre-anesthesia Checklist: Patient identified, Emergency Drugs available, Suction available and Patient being monitored Patient Re-evaluated:Patient Re-evaluated prior to inductionOxygen Delivery Method: Circle System Utilized Preoxygenation: Pre-oxygenation with 100% oxygen Intubation Type: IV induction Ventilation: Mask ventilation without difficulty LMA: LMA inserted LMA Size: 4.0 Number of attempts: 1 Airway Equipment and Method: bite block Placement Confirmation: positive ETCO2 Dental Injury: Teeth and Oropharynx as per pre-operative assessment

## 2013-03-01 NOTE — Op Note (Signed)
Diagnosis: Left ureteral stone Microscopic hematuria  Postop diagnosis:  Left ureteral stone  Microscopic hematuria  BPH with significant median lobe   Procedure:  Cystoscopy  Left retrograde pyelogram with interpretation  Left ureteroscopy stone basket extraction  Left ureteral stent placement with string   Surgeon: Mercer Peifer  Type of anesthesia: Gen.   Findings:  On cystoscopy the urethra was normal. The prostatic urethra revealed a percent visual occlusion with a large median lobe. This caused J. hooking of the ureters.   Left retrograde pyelogram  on scout imaging the calcification was seen in the left pelvis. There was also left renal stone. After retrograde injection of contrast the ureter was completely J. taking 180 turn back up into the bladder. There was a filling defect in the left distal ureter consistent with the ureteral stone.  On ureteroscopy the stone was noted in the left distal ureter and removed intact without difficulty   description of procedure: After consent was obtained patient brought to the operating room. After adequate anesthesia he is placed in lithotomy position and prepped and draped in the usual sterile fashion. A timeout was performed to confirm the patient and procedure. A cystoscope was passed per urethra and the left ureteral orifice identified. A 5 French open-ended catheter was used to cannulate the left ureteral orifice and injecting contrast in a retrograde fashion. Next with some difficulty an angled glide wire was used to cannulate the ureteral orifice and access to the collecting system. As the wire advanced up the ureter and the ureter straightened out and the open-ended catheter was advanced up into the left proximal ureter. The Glidewire was removed and replaced with a sensor wire. The open-ended catheter was removed. The bladder was drained and the scope removed. The rigid ureteroscope was then advanced adjacent to the wire and initially could  not get access to the ureter. Therefore a dual-lumen catheter was used to dilate the ureter without difficulty. Now the scope passed easily into the distal ureter where the stone was noted. It was ensnared in a [employer] and removed intact without difficulty. The wire was backloaded on the cystoscope and a cystoscope inserted into the bladder. A 6 x 26 cm double-J ureteral stent was advanced and the wire was removed noting good placement of the stent in the collecting system and a good coil in the bladder. The bladder was drained and the scope removed carefully leaving the string attached. Patient was awakened and taken to the recovery room in stable condition.  Complications: None Blood loss: Minimal Specimens: Stone - given the patient  Drains: 6 x 26 cm left ureteral stent with string  Disposition: Patient stable to PACU

## 2013-03-01 NOTE — Anesthesia Postprocedure Evaluation (Signed)
  Anesthesia Post-op Note  Patient: Edward [name]  Procedure(s) Performed: Procedure(s) (LRB): CYSTOSCOPY WITH LEFT RETROGRADE PYELOGRAM, LEFT URETEROSCOPY and stent PLACEMENT (Left)  Patient Location: PACU  Anesthesia Type: General  Level of Consciousness: awake and alert   Airway and Oxygen Therapy: Patient Spontanous Breathing  Post-op Pain: mild  Post-op Assessment: Post-op Vital signs reviewed, Patient's Cardiovascular Status Stable, Respiratory Function Stable, Patent Airway and No signs of Nausea or vomiting  Last Vitals:  Filed Vitals:   03/01/13 1115  BP: 116/72  Pulse: 59  Temp:   Resp: 13    Post-op Vital Signs: stable   Complications: No apparent anesthesia complications

## 2013-03-02 ENCOUNTER — Encounter (HOSPITAL_BASED_OUTPATIENT_CLINIC_OR_DEPARTMENT_OTHER): Payer: Self-pay | Admitting: Urology

## 2013-09-15 ENCOUNTER — Other Ambulatory Visit: Payer: Self-pay | Admitting: Urology

## 2013-09-26 ENCOUNTER — Encounter (HOSPITAL_BASED_OUTPATIENT_CLINIC_OR_DEPARTMENT_OTHER): Payer: Self-pay | Admitting: *Deleted

## 2013-09-27 ENCOUNTER — Encounter (HOSPITAL_BASED_OUTPATIENT_CLINIC_OR_DEPARTMENT_OTHER): Payer: Self-pay | Admitting: *Deleted

## 2013-09-27 NOTE — Progress Notes (Signed)
09/27/13 1037  OBSTRUCTIVE SLEEP APNEA  Have you ever been diagnosed with sleep apnea through a sleep study? No  Do you snore loudly (loud enough to be heard through closed doors)?  1  Do you often feel tired, fatigued, or sleepy during the daytime? 0  Has anyone observed you stop breathing during your sleep? 0  Do you have, or are you being treated for high blood pressure? 1  BMI more than 35 kg/m2? 0  Age over 382 years old? 1  Neck circumference greater than 40 cm/18 inches? 0  Gender: 1  Obstructive Sleep Apnea Score 4  Score 4 or greater  Results sent to PCP

## 2013-09-27 NOTE — Progress Notes (Signed)
NPO AFTER MN. ARRIVE AT 0600. NEEDS ISTAT. CURRENT EKG AND LOV NOTE FROM DR TILLEY W/ CHART. WILL TAKE LOSARTAN AM DOS W/ SIPS OF WATER.

## 2013-10-03 ENCOUNTER — Encounter (HOSPITAL_BASED_OUTPATIENT_CLINIC_OR_DEPARTMENT_OTHER): Payer: Self-pay | Admitting: Anesthesiology

## 2013-10-03 NOTE — Anesthesia Preprocedure Evaluation (Addendum)
Anesthesia Evaluation  Patient identified by MRN, date of birth, ID band Patient awake    Reviewed: Allergy & Precautions, H&P , NPO status , Patient's Chart, lab work & pertinent test results, reviewed documented beta blocker date and time   Airway Mallampati: II TM Distance: >3 FB Neck ROM: Full    Dental no notable dental hx. (+) Caps, Dental Advisory Given Left upper front is capped:   Pulmonary neg pulmonary ROS, former smoker,  Stop bang 4 breath sounds clear to auscultation  Pulmonary exam normal       Cardiovascular Exercise Tolerance: Good hypertension, Pt. on medications and Pt. on home beta blockers + CAD Rhythm:Regular Rate:Normal  Stress ECHO 01-17-11 was normal.  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 Acute idiopathic pericarditis   Neuro/Psych vertigo negative neurological ROS  negative psych ROS   GI/Hepatic negative GI ROS, Neg liver ROS,   Endo/Other  negative endocrine ROS  Renal/GU negative Renal ROS  negative genitourinary   Musculoskeletal negative musculoskeletal ROS (+)   Abdominal   Peds negative pediatric ROS (+)  Hematology negative hematology ROS (+)   Anesthesia Other Findings   Reproductive/Obstetrics negative OB ROS                        Anesthesia Physical Anesthesia Plan  ASA: III  Anesthesia Plan: General   Post-op Pain Management:    Induction: Intravenous  Airway Management Planned: LMA  Additional Equipment:   Intra-op Plan:   Post-operative Plan: Extubation in OR  Informed Consent: I have reviewed the patients History and Physical, chart, labs and discussed the procedure including the risks, benefits and alternatives for the proposed anesthesia with the patient or authorized representative who has indicated his/her understanding and acceptance.   Dental advisory given  Plan Discussed with: CRNA and Surgeon  Anesthesia Plan Comments:         Anesthesia Quick Evaluation

## 2013-10-03 NOTE — H&P (Signed)
History of Present Illness        F/u - BPH, elevated PSA, left spermatocele, nephrolithiasis.     1- BPH/Elevated PSA -   His PSA runs in the 8-12 range. He had biopsies done in 2005, 2009 and 2010 --> 85 g then 59 gram prostate  -Oct 2010 PSA 11.9   -Sept 2012 bun 14, cr 0.98.   -Oct 2012 normal DRE  -Apr 2013 PSA 8.17. His AUA SS = 11 mixed QOL.   -October 2013 PSA 14 with pyuria on U/A. Started on Cipro. DRE was normal.   -May 2014 showed PSA 10.95, UA tntc red cells, BUN 15, creatinine 1.1, normal LFTs and alk phos   -Jun 2014 normal DRE  -Jul 2014 cystoscopy - large median lobe on cysto in OR with BOO  On surveillance      2 - Nephrolithiasis -   -June 2011 developed microhematuria.   A CT revealed bilateral stones. A cystoscopy July 2011 was negative apart from a small median lobe and significant lateral lobes.   -Mar 2012 KUB showed bilateral renal stones and lots of phleboliths.  -Nov 2013 Renal ultrasound right kidney is normal in appearance with several stones ranging from 11 mm to 19 mm. There is no hydronephrosis or mass. Left kidney appears normal without mass or hydronephrosis. There is a 7 mm stone. The bladder appears normal with no ureters or prostate seen. The postvoid residual is 3.35 mL.  KUB right renal stone, left renal stone. Several stable pelvic phleboliths. Normal bowel gas and bones.  -Jan 2014 CT A/P - stone from left kidney about 6 mm now at left UVJ, minimal hydro, comparison CT A/P 2011 when this stone was in the left kidney and there were no stones at left UVJ.   -Jun 2014 KUB  - stone at Left UVJ still present and moved down compared to CT and KUB Nov 2013. normal bones and bowel gas pattern.   -Jul 2014 left URS, stone extraction, stent. Pt removed stent.     3-MH, gross hematuria -  -June 2011 [facility] - CT above; cystoscopy normal   -June 2014 gross hematuria - CT Jan 2014 - stones, cysto Jul 2014 (in [place] for stones)          Jan 2015 Interval Hx  Pt returns early due to bulge in left hemiscrotum he's noticed for years. He's had pain on and off on left and now on right. He wonders if it's related to his vasectomy. He wonders if you can aspirate it. It's getting larger and bothersome.   Pt returns with gross hematuria. He noted clots per urethra and in the urine a few weeks ago. It cleared. U/A today clear. No dysuria. No flank pain or stone passage. Blood developed after episodes of pain in the 'colon' with a bowel movement. He wonders if it's related to his "prostate".   He voids with a weak stream, but needs to sit. He has some dribble after voiding.        Past Medical History Problems  1. History of hypercholesterolemia (V12.29) 2. History of hypertension (V12.59) 3. History of kidney stones (V13.01) 4. 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 History of Cystoscopy With Insertion Of Ureteral Stent Left 2. History of Cystoscopy With Ureteroscopy With Removal Of Calculus 3. History of Foot Surgery 4. History of Inguinal Hernia Repair 5. History of Shoulder Surgery 6. History of Shoulder Surgery Left 7. History of Umbilical Hernia Repair  Current Meds 1. AmLODIPine Besylate 10 MG Oral Tablet;  Therapy: [id number] to Recorded 2. Aspirin 81 MG Oral Tablet;  Therapy: (Recorded:18Nov2013) to Recorded 3. Atenolol 50 MG Oral Tablet;  Therapy: (Recorded:29Aug2008) to Recorded 4. Crestor 10 MG Oral Tablet;  Therapy: 28Jun2010 to Recorded 5. Losartan Potassium 50 MG Oral Tablet;  Therapy: [id number] to Recorded 6. Tamsulosin HCl - 0.4 MG Oral Capsule;  Therapy: 36OQH4765 to Recorded  Allergies Medication  1. No Known Drug Allergies  Family History Problems  1. Family history of Death In The Family Father : Father   MVA age 47 2. Family history of Research officer, trade union Accident : Father  Social History Problems  1. Alcohol Use   Two a week 2.  Caffeine Use   3 cups per day 3. Marital History - Currently Married 4. Occupation:   Retired 36. History of Tobacco Use (V15.82)  Vitals Vital Signs [Data Includes: Last 1 Day]  Recorded: 20Jan2015 02:36PM  Blood Pressure: 154 / 89 Temperature: 97.8 F Heart Rate: 56  Physical Exam Constitutional: Well nourished and well developed . No acute distress.  ENT:. The ears and nose are normal in appearance.  Neck: The appearance of the neck is normal and no neck mass is present.  Pulmonary: No respiratory distress and normal respiratory rhythm and effort.  Cardiovascular: Heart rate and rhythm are normal . No peripheral edema.  Abdomen: The abdomen is soft and nontender. No masses are palpated. No CVA tenderness. No hernias are palpable. No hepatosplenomegaly noted.  Rectal: Rectal exam demonstrates normal sphincter tone, no tenderness and no masses. Estimated prostate size is 3+. The prostate has no nodularity and is not tender. The left seminal vesicle is nonpalpable. The right seminal vesicle is nonpalpable. The perineum is normal on inspection.  Genitourinary: Examination of the penis demonstrates no discharge, no masses, no lesions and a normal meatus. The scrotum is without lesions. The right epididymis is palpably normal and non-tender. The left epididymis is found to have a 5 cm cm spermatocele, but palpably normal and non-tender. The right testis is non-tender and without masses. The left testis is non-tender and without masses.  Lymphatics: The femoral and inguinal nodes are not enlarged or tender.  Skin: Normal skin turgor, no visible rash and no visible skin lesions.  Neuro/Psych:. Mood and affect are appropriate.    Results/Data Urine [Data Includes: Last 1 Day]   20Jan2015  COLOR YELLOW   APPEARANCE CLEAR   SPECIFIC GRAVITY 1.020   pH 5.5   GLUCOSE NEG mg/dL  BILIRUBIN NEG   KETONE NEG mg/dL  BLOOD SMALL   PROTEIN NEG mg/dL  UROBILINOGEN 0.2 mg/dL  NITRITE NEG    LEUKOCYTE ESTERASE NEG   SQUAMOUS EPITHELIAL/HPF NONE SEEN   WBC 0-2 WBC/hpf  RBC 0-2 RBC/hpf  BACTERIA NONE SEEN   CRYSTALS N S   CASTS NONE SEEN    Assessment Assessed  1. Benign localized prostatic hyperplasia with lower urinary tract symptoms (LUTS)  (600.21,599.69) 2. Elevated prostate specific antigen (PSA) (790.93) 3. 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 Follow-up Schedule Surgery Office  Follow-up  Status: Hold For - Appointment   Requested for: 20Jan2015 Elevated prostate specific antigen (PSA)  2. PSA; Status:Hold For - Specimen/Data Collection,Appointment; Requested  YYT:[id number];   UA With REFLEX; [Do Not Release]; Status:Complete;  Done: 27NTZ0017 12:00AM CBS:[id number]; Marked Important;Ordered; Today;  WGY:KZLDJT Maintenance; Ordered TS:[id number], Jeriel Vivanco;   Discussion/Summary spermatocele - discussed nature,  r/b of aspiration vs. formal repair. He is interested in repair. We discussed nature, R/B of spermatocelectomy, left.     BPH - discussed nature, r/b of alpha blocker, surveillance, 5ARI, greenlight PVP. He is looking for a more permanent solution and is interested in GL. We discussed nature, r/b/a of GL - should improve hematuria, improve flow, risk sx or incontinence among others.   PSA - PSA was sent. DRE normal, prostate 3+.   Stones - stable.   Hematuria - likely prostatic - pt has had recent work-up.   One consideration would be to proceed with GL and spermatocele at same time. If PSA rising, pt wants to go ahead with prostate bx under anesthesia.       Signatures Electronically signed by : Festus Aloe, M.D.; Sep 06 2013  2:59PM EST   ADD: PSA stable at 9.05 Sep 2013.

## 2013-10-04 ENCOUNTER — Ambulatory Visit (HOSPITAL_COMMUNITY): Payer: Medicare Other | Admitting: Anesthesiology

## 2013-10-04 ENCOUNTER — Encounter (HOSPITAL_COMMUNITY)
Admission: RE | Disposition: A | Discharge status: Home / Self Care | Payer: Self-pay | Source: Ambulatory Visit | Attending: Urology

## 2013-10-04 ENCOUNTER — Encounter (HOSPITAL_COMMUNITY): Payer: Self-pay | Admitting: *Deleted

## 2013-10-04 ENCOUNTER — Encounter (HOSPITAL_COMMUNITY): Payer: Medicare Other | Admitting: Anesthesiology

## 2013-10-04 ENCOUNTER — Ambulatory Visit (HOSPITAL_COMMUNITY): Payer: Medicare Other

## 2013-10-04 ENCOUNTER — Ambulatory Visit (HOSPITAL_BASED_OUTPATIENT_CLINIC_OR_DEPARTMENT_OTHER)
Admission: RE | Admit: 2013-10-04 | Discharge: 2013-10-04 | Disposition: A | Discharge status: Home / Self Care | Payer: Medicare Other | Source: Ambulatory Visit | Attending: Urology | Admitting: Urology

## 2013-10-04 DIAGNOSIS — Z7982 Long term (current) use of aspirin: Secondary | ICD-10-CM | POA: Insufficient documentation

## 2013-10-04 DIAGNOSIS — E7800 Pure hypercholesterolemia, unspecified: Secondary | ICD-10-CM | POA: Insufficient documentation

## 2013-10-04 DIAGNOSIS — I10 Essential (primary) hypertension: Secondary | ICD-10-CM | POA: Insufficient documentation

## 2013-10-04 DIAGNOSIS — N138 Other obstructive and reflux uropathy: Secondary | ICD-10-CM | POA: Insufficient documentation

## 2013-10-04 DIAGNOSIS — Z87891 Personal history of nicotine dependence: Secondary | ICD-10-CM | POA: Insufficient documentation

## 2013-10-04 DIAGNOSIS — Z9852 Vasectomy status: Secondary | ICD-10-CM | POA: Insufficient documentation

## 2013-10-04 DIAGNOSIS — N401 Enlarged prostate with lower urinary tract symptoms: Secondary | ICD-10-CM | POA: Insufficient documentation

## 2013-10-04 DIAGNOSIS — Z79899 Other long term (current) drug therapy: Secondary | ICD-10-CM | POA: Insufficient documentation

## 2013-10-04 DIAGNOSIS — N200 Calculus of kidney: Secondary | ICD-10-CM | POA: Insufficient documentation

## 2013-10-04 DIAGNOSIS — R972 Elevated prostate specific antigen [PSA]: Secondary | ICD-10-CM | POA: Insufficient documentation

## 2013-10-04 DIAGNOSIS — N4340 Spermatocele of epididymis, unspecified: Secondary | ICD-10-CM | POA: Insufficient documentation

## 2013-10-04 DIAGNOSIS — N320 Bladder-neck obstruction: Secondary | ICD-10-CM | POA: Insufficient documentation

## 2013-10-04 HISTORY — PX: GREEN LIGHT LASER TURP (TRANSURETHRAL RESECTION OF PROSTATE: SHX6260

## 2013-10-04 HISTORY — DX: Spermatocele of epididymis, unspecified: N43.40

## 2013-10-04 HISTORY — PX: SPERMATOCELECTOMY: SHX2420

## 2013-10-04 HISTORY — DX: Other specified personal risk factors, not elsewhere classified: Z91.89

## 2013-10-04 LAB — BASIC METABOLIC PANEL
BUN: 15 mg/dL (ref 6–23)
CALCIUM: 10.1 mg/dL (ref 8.4–10.5)
CHLORIDE: 105 meq/L (ref 96–112)
CO2: 25 mEq/L (ref 19–32)
CREATININE: 1.07 mg/dL (ref 0.50–1.35)
GFR, EST AFRICAN AMERICAN: 79 mL/min — AB (ref >90)
GFR, EST NON AFRICAN AMERICAN: 68 mL/min — AB (ref >90)
Glucose, Bld: 102 mg/dL — ABNORMAL HIGH (ref 70–99)
Potassium: 4.8 mEq/L (ref 3.7–5.3)
Sodium: 142 mEq/L (ref 137–147)

## 2013-10-04 SURGERY — GREEN LIGHT LASER TURP (TRANSURETHRAL RESECTION OF PROSTATE
Anesthesia: General | Laterality: Left

## 2013-10-04 SURGERY — GREEN LIGHT LASER TURP (TRANSURETHRAL RESECTION OF PROSTATE
Anesthesia: General

## 2013-10-04 MED ORDER — TRAMADOL HCL 50 MG PO TABS: 100.0000 mg | ORAL_TABLET | Freq: Four times a day (QID) | ORAL | Status: DC | PRN

## 2013-10-04 MED ORDER — ASPIRIN 81 MG PO TABS: 81.0000 mg | ORAL_TABLET | Freq: Every day | ORAL | Status: DC

## 2013-10-04 MED ORDER — LACTATED RINGERS IV SOLN
INTRAVENOUS | Status: DC | PRN
  Administered 2013-10-04 (×2): via INTRAVENOUS

## 2013-10-04 MED ORDER — BELLADONNA ALKALOIDS-OPIUM 16.2-60 MG RE SUPP
RECTAL | Status: AC
  Filled 2013-10-04: qty 1

## 2013-10-04 MED ORDER — MIDAZOLAM HCL 5 MG/5ML IJ SOLN
INTRAMUSCULAR | Status: DC | PRN
  Administered 2013-10-04: 2 mg via INTRAVENOUS

## 2013-10-04 MED ORDER — ONDANSETRON HCL 4 MG/2ML IJ SOLN
INTRAMUSCULAR | Status: DC | PRN
  Administered 2013-10-04: 4 mg via INTRAVENOUS

## 2013-10-04 MED ORDER — SODIUM CHLORIDE 0.9 % IJ SOLN
INTRAMUSCULAR | Status: AC
  Filled 2013-10-04: qty 10

## 2013-10-04 MED ORDER — FENTANYL CITRATE 0.05 MG/ML IJ SOLN
INTRAMUSCULAR | Status: AC
  Filled 2013-10-04: qty 5

## 2013-10-04 MED ORDER — 0.9 % SODIUM CHLORIDE (POUR BTL) OPTIME
TOPICAL | Status: DC | PRN
  Administered 2013-10-04: 1000 mL

## 2013-10-04 MED ORDER — TRAMADOL HCL 50 MG PO TABS
100.0000 mg | ORAL_TABLET | Freq: Four times a day (QID) | ORAL | Status: DC
  Administered 2013-10-04: 100 mg via ORAL
  Filled 2013-10-04: qty 2

## 2013-10-04 MED ORDER — PROPOFOL 10 MG/ML IV BOLUS
INTRAVENOUS | Status: DC | PRN
  Administered 2013-10-04: 200 mg via INTRAVENOUS

## 2013-10-04 MED ORDER — FENTANYL CITRATE 0.05 MG/ML IJ SOLN
INTRAMUSCULAR | Status: DC | PRN
  Administered 2013-10-04: 50 ug via INTRAVENOUS
  Administered 2013-10-04: 25 ug via INTRAVENOUS
  Administered 2013-10-04: 50 ug via INTRAVENOUS
  Administered 2013-10-04 (×3): 25 ug via INTRAVENOUS
  Administered 2013-10-04: 50 ug via INTRAVENOUS

## 2013-10-04 MED ORDER — PROPOFOL 10 MG/ML IV BOLUS
INTRAVENOUS | Status: AC
  Filled 2013-10-04: qty 20

## 2013-10-04 MED ORDER — BUPIVACAINE HCL (PF) 0.5 % IJ SOLN
INTRAMUSCULAR | Status: AC
  Filled 2013-10-04: qty 30

## 2013-10-04 MED ORDER — LIDOCAINE HCL (CARDIAC) 20 MG/ML IV SOLN
INTRAVENOUS | Status: AC
  Filled 2013-10-04: qty 5

## 2013-10-04 MED ORDER — EPHEDRINE SULFATE 50 MG/ML IJ SOLN
INTRAMUSCULAR | Status: DC | PRN
  Administered 2013-10-04 (×3): 2.5 mg via INTRAVENOUS

## 2013-10-04 MED ORDER — CEPHALEXIN 500 MG PO CAPS: 500.0000 mg | ORAL_CAPSULE | Freq: Two times a day (BID) | ORAL | Status: DC

## 2013-10-04 MED ORDER — SODIUM CHLORIDE 0.9 % IR SOLN
Status: DC | PRN
  Administered 2013-10-04: 19000 mL

## 2013-10-04 MED ORDER — MIDAZOLAM HCL 2 MG/2ML IJ SOLN
INTRAMUSCULAR | Status: AC
  Filled 2013-10-04: qty 2

## 2013-10-04 MED ORDER — FENTANYL CITRATE 0.05 MG/ML IJ SOLN
25.0000 ug | INTRAMUSCULAR | Status: DC | PRN
  Administered 2013-10-04 (×2): 50 ug via INTRAVENOUS

## 2013-10-04 MED ORDER — EPHEDRINE SULFATE 50 MG/ML IJ SOLN
INTRAMUSCULAR | Status: AC
  Filled 2013-10-04: qty 1

## 2013-10-04 MED ORDER — CEFAZOLIN SODIUM-DEXTROSE 2-3 GM-% IV SOLR
INTRAVENOUS | Status: AC
  Filled 2013-10-04: qty 50

## 2013-10-04 MED ORDER — BUPIVACAINE HCL 0.5 % IJ SOLN
INTRAMUSCULAR | Status: DC | PRN
  Administered 2013-10-04: 5 mL

## 2013-10-04 MED ORDER — TAMSULOSIN HCL 0.4 MG PO CAPS: 0.4000 mg | ORAL_CAPSULE | Freq: Every day | ORAL | Status: DC

## 2013-10-04 MED ORDER — LIDOCAINE HCL 1 % IJ SOLN
INTRAMUSCULAR | Status: DC | PRN
  Administered 2013-10-04: 40 mg via INTRADERMAL
  Administered 2013-10-04: 60 mg via INTRADERMAL

## 2013-10-04 MED ORDER — CEFAZOLIN SODIUM-DEXTROSE 2-3 GM-% IV SOLR
2.0000 g | INTRAVENOUS | Status: AC
  Administered 2013-10-04: 2 g via INTRAVENOUS

## 2013-10-04 MED ORDER — BELLADONNA ALKALOIDS-OPIUM 16.2-60 MG RE SUPP
RECTAL | Status: DC | PRN
  Administered 2013-10-04: 1 via RECTAL

## 2013-10-04 MED ORDER — LACTATED RINGERS IV SOLN: INTRAVENOUS | Status: DC

## 2013-10-04 MED ORDER — FENTANYL CITRATE 0.05 MG/ML IJ SOLN
INTRAMUSCULAR | Status: AC
  Filled 2013-10-04: qty 2

## 2013-10-04 MED ORDER — ONDANSETRON HCL 4 MG/2ML IJ SOLN
INTRAMUSCULAR | Status: AC
  Filled 2013-10-04: qty 2

## 2013-10-04 SURGICAL SUPPLY — 34 items
BAG URINE DRAINAGE (UROLOGICAL SUPPLIES) ×4 IMPLANT
BAG URO CATCHER STRL LF (DRAPE) ×4 IMPLANT
BNDG GAUZE ELAST 4 BULKY (GAUZE/BANDAGES/DRESSINGS) ×2 IMPLANT
CATH FOLEY 2WAY 5CC 20FR (CATHETERS) ×2 IMPLANT
CATH TIEMANN FOLEY 18FR 5CC (CATHETERS) IMPLANT
COUNTER NEEDLE 20 DBL MAG RED (NEEDLE) ×2 IMPLANT
DRAPE CAMERA CLOSED 9X96 (DRAPES) ×4 IMPLANT
DRAPE PED LAPAROTOMY (DRAPES) ×2 IMPLANT
DRSG TELFA 3X8 NADH (GAUZE/BANDAGES/DRESSINGS) ×4 IMPLANT
ELECT BUTTON HF 24-28F 2 30DE (ELECTRODE) IMPLANT
ELECT LOOP MED HF 24F 12D (CUTTING LOOP) IMPLANT
ELECT LOOP MED HF 24F 12D CBL (CLIP) IMPLANT
ELECT RESECT VAPORIZE 12D CBL (ELECTRODE) ×2 IMPLANT
FEE RENTAL LASER GREENLIGHT (Laser) ×2 IMPLANT
GAUZE SPONGE 4X4 16PLY XRAY LF (GAUZE/BANDAGES/DRESSINGS) ×2 IMPLANT
GLOVE BIOGEL M STRL SZ7.5 (GLOVE) ×6 IMPLANT
GOWN STRL REUS W/TWL XL LVL3 (GOWN DISPOSABLE) ×4 IMPLANT
HOLDER FOLEY CATH W/STRAP (MISCELLANEOUS) ×2 IMPLANT
LASER FIBER /GREENLIGHT LASER (Laser) ×4 IMPLANT
LASER GREENLIGHT RENTAL P/PROC (Laser) ×4 IMPLANT
MANIFOLD NEPTUNE II (INSTRUMENTS) ×4 IMPLANT
NDL HYPO 25X1 1.5 SAFETY (NEEDLE) IMPLANT
NEEDLE HYPO 25X1 1.5 SAFETY (NEEDLE) ×4 IMPLANT
PACK CYSTO (CUSTOM PROCEDURE TRAY) ×6 IMPLANT
PAD DRESSING TELFA 3X8 NADH (GAUZE/BANDAGES/DRESSINGS) IMPLANT
SUT CHROMIC 2 0 SH (SUTURE) ×4 IMPLANT
SUT VIC AB 2-0 SH 27 (SUTURE) ×4
SUT VIC AB 2-0 SH 27X BRD (SUTURE) IMPLANT
SYR 30ML LL (SYRINGE) IMPLANT
SYRINGE IRR TOOMEY STRL 70CC (SYRINGE) IMPLANT
TOWEL OR 17X26 10 PK STRL BLUE (TOWEL DISPOSABLE) ×2 IMPLANT
TUBING CONNECTING 10 (TUBING) ×3 IMPLANT
TUBING CONNECTING 10' (TUBING) ×1
YANKAUER SUCT BULB TIP NO VENT (SUCTIONS) ×2 IMPLANT

## 2013-10-04 NOTE — Discharge Instructions (Signed)
Spermatocele, Adult  Fluid can collect around the testicle and epididymis. This fluid forms in a sac or cyst. This condition is called a spermatocele.  HOME CARE INSTRUCTIONS  What you need to do at home may depend on the cause of the hydrocele and type of treatment. In general:  Take all medicine as directed by your caregiver. Follow the directions carefully.  Ask your caregiver if there is anything you should not do while you recover (activities, lifting, work, sex).  If you had surgery to repair a communicating hydrocele, recovery time may vary. Ask you caregiver about your recovery time.  Avoid heavy lifting for 4 to 6 weeks.  If you had an incision on the scrotum, wash it for 2 to 3 days after surgery. Do this as long as the skin is closed and there are no gaps in the wound. Wash gently, and avoid rubbing the incision.  Keep all follow-up appointments. SEEK MEDICAL CARE IF:   Your scrotum seems to be getting larger.  The area becomes more and more uncomfortable. SEEK IMMEDIATE MEDICAL CARE IF:  You have a fever. Document Released: 01/22/2010 Document Revised: 05/25/2013 Document Reviewed: 01/22/2010 [facility] Patient Information 2014 [place], [place]. Prostate Laser Surgery, Care After Refer to this sheet in the next few weeks. These instructions provide you with information on caring for yourself after your procedure. Your health care provider may also give you more specific instructions. Your treatment has been planned according to current medical practices, but problems sometimes occur. Call your health care provider if you have any problems or questions after your procedure. WHAT TO EXPECT AFTER THE PROCEDURE  Blood in your urine will last from a few days to 3 weeks. You may have bleeding in the urine.  After your catheter is removed, you will have burning (especially at the tip of your penis) when you urinate, especially at the end of urination. For the first few weeks after  your procedure, you will feel the need to urinate often. HOME CARE INSTRUCTIONS   Do not perform vigorous exercise, especially heavy lifting, for 1 week or as directed by your health care provider.  Avoid sexual activity for 4 6 weeks or as directed by your health care provider.  Do not ride in a car for extended periods for 1 month or as directed by your health care provider.  Do not strain to have a bowel movement. Drink a lot of fluids and and make sure you get enough fiber in your diet.  Drink enough fluids to keep your urine clear or pale yellow. SEEK IMMEDIATE MEDICAL CARE IF:   Your catheter has been removed and you are suddenly unable to urinate.  Your catheter has not been removed, and it develops a blockage.  You start to have blood clots in your urine.  The blood in your urine becomes persistent or gets thick.  Your temperature is greater than 100.77F (38.1C).  You develop chest pains.  You develop shortness of breath.  You develop leg swelling or pain. Document Released: 08/04/2005 Document Revised: 04/06/2013 Document Reviewed: 01/24/2013 [facility] Patient Information 2014 [place], [place]. Foley Catheter Care, Adult A Foley catheter is a soft, flexible tube that is placed into the bladder to drain urine. A Foley catheter may be inserted if:  You leak urine or are not able to control when you urinate (urinary incontinence).  You are not able to urinate when you need to (urinary retention).  You had prostate surgery or surgery on the genitals.  You have certain medical conditions, such as multiple sclerosis, dementia, or a spinal cord injury. If you are going home with a Foley catheter in place, follow the instructions below. TAKING CARE OF THE CATHETER 1. Wash your hands with soap and water. 2. Using mild soap and warm water on a clean washcloth:  Clean the area on your body closest to the catheter insertion site using a circular motion, moving away from the  catheter. Never wipe toward the catheter because this could sweep bacteria up into the urethra and cause infection.  Remove all traces of soap. Pat the area dry with a clean towel. For males, reposition the foreskin. 3. Attach the catheter to your leg so there is no tension on the catheter. Use adhesive tape or a leg strap. If you are using adhesive tape, remove any sticky residue left behind by the previous tape you used. 4. Keep the drainage bag below the level of the bladder, but keep it off the floor. 5. Check throughout the day to be sure the catheter is working and urine is draining freely. Make sure the tubing does not become kinked. 6. Do not pull on the catheter or try to remove it. Pulling could damage internal tissues. TAKING CARE OF THE DRAINAGE BAGS You will be given two drainage bags to take home. One is a large overnight drainage bag, and the other is a smaller leg bag that fits underneath clothing. You may wear the overnight bag at any time, but you should never wear the smaller leg bag at night. Follow the instructions below for how to empty, change, and clean your drainage bags. Emptying the Drainage Bag You must empty your drainage bag when it is    full or at least 2 3 times a day. 1. Wash your hands with soap and water. 2. Keep the drainage bag below your hips, below the level of your bladder. This stops urine from going back into the tubing and into your bladder. 3. Hold the dirty bag over the toilet or a clean container. 4. Open the pour spout at the bottom of the bag and empty the urine into the toilet or container. Do not let the pour spout touch the toilet, container, or any other surface. Doing so can place bacteria on the bag, which can cause an infection. 5. Clean the pour spout with a gauze pad or cotton ball that has rubbing alcohol on it. 6. Close the pour spout. 7. Attach the bag to your leg with adhesive tape or a leg strap. 8. Wash your hands well. Changing the  Drainage Bag Change your drainage bag once a month or sooner if it starts to smell bad or look dirty. Below are steps to follow when changing the drainage bag. 1. Wash your hands with soap and water. 2. Pinch off the rubber catheter so that urine does not spill out. 3. Disconnect the catheter tube from the drainage tube at the connection valve. Do not let the tubes touch any surface. 4. Clean the end of the catheter tube with an alcohol wipe. Use a different alcohol wipe to clean the end of the drainage tube. 5. Connect the catheter tube to the drainage tube of the clean drainage bag. 6. Attach the new bag to the leg with adhesive tape or a leg strap. Avoid attaching the new bag too tightly. 7. Wash your hands well. Cleaning the Drainage Bag 1. Wash your hands with soap and water. 2. Wash the bag in  warm, soapy water. 3. Rinse the bag thoroughly with warm water. 4. Fill the bag with a solution of white vinegar and water (1 cup vinegar to 1 qt warm water [.2 L vinegar to 1 L warm water]). Close the bag and soak it for 30 minutes in the solution. 5. Rinse the bag with warm water. 6. Hang the bag to dry with the pour spout open and hanging downward. 7. Store the clean bag (once it is dry) in a clean plastic bag. 8. Wash your hands well. PREVENTING INFECTION  Wash your hands before and after handling your catheter.  Take showers daily and wash the area where the catheter enters your body. Do not take baths. Replace wet leg straps with dry ones, if this applies.  Do not use powders, sprays, or lotions on the genital area. Only use creams, lotions, or ointments as directed by your caregiver.  For females, wipe from front to back after each bowel movement.  Drink enough fluids to keep your urine clear or pale yellow unless you have a fluid restriction.  Do not let the drainage bag or tubing touch or lie on the floor.  Wear cotton underwear to absorb moisture and to keep your skin  drier. SEEK MEDICAL CARE IF:   Your urine is cloudy or smells unusually bad.  Your catheter becomes clogged.  You are not draining urine into the bag or your bladder feels full.  Your catheter starts to leak. SEEK IMMEDIATE MEDICAL CARE IF:   You have pain, swelling, redness, or pus where the catheter enters the body.  You have pain in the abdomen, legs, lower back, or bladder.  You have a fever.  You see blood fill the catheter, or your urine is pink or red.  You have nausea, vomiting, or chills.  Your catheter gets pulled out. MAKE SURE YOU:   Understand these instructions.  Will watch your condition.  Will get help right away if you are not doing well or get worse. Document Released: 08/04/2005 Document Revised: 11/29/2012 Document Reviewed: 07/26/2012 [facility] Patient Information 2014 [place], [place].

## 2013-10-04 NOTE — Op Note (Signed)
Preop diagnosis: BPH, left spermatocele Postop diagnosis: Same  Procedure: Exam under anesthesia Greenlight photo vaporization of the prostate Left spermatocelectomy  Surgeon: Miki Blank Type of anesthesia: Gen.  Findings: On exam under anesthesia the testicles were descended bilaterally, there was a cystic mass superiorly on the left testicle consistent with a spermatocele. There were no edema no hernias palpated. The penis was normal without lesion. On digital rectal exam the prostate was smooth without hard area or nodule. The lateral sulci were preserved normally.  On cystoscopy the urethra was normal although the meatus and fossa navicularis was quite tight. The prostatic urethra showed tribe lobar hypertrophy with a large median lobe. An exam of the bladder there was moderate trabeculation without foreign body or stone. The mucosa appeared normal. The trigone ureteral orifices were in the normal top position. With there was clear efflux.   Description of procedure: After consent was obtained patient brought to the operating room. After adequate anesthesia he was placed in lithotomy position. A timeout was performed to confirm the patient and procedure. An exam under anesthesia was performed and I placed a B&O suppository. He was prepped and draped in the usual sterile fashion.   The laser scope was passed per urethra and the bladder examined. I then began vaporization at 2780 W from anterior to posterior bladder neck to the veru to take down the lateral lobes. This created some space but also developed the typical bleeding at the 5 and 7:00 at the junction of the median and lateral lobes. His prostate was quite large and the mucosa quite vascular and at 1 point I thought I might need to place the gyrus and fulgurate but I did not need to do this.  I went on to the median lobe and began to laser it from the bladder neck down toward the. To laser the median lobe, I kept the laser aimed lateral to  medial to lateral to superior which vaporized it straight across the bladder neck toward the midline and kept this depth going down toward the veru. The power was increased to 110 W. I alternated starting from the right side and the left side. Finally a larger piece of median lobe drift off into the bladder. This created an excellent channel. I then went back and cleaned up some of the lateral lobe tissue near the apex from anterior to posterior now that had more space for better irrigation and the bleeding in the corners had settled down. During vaporization I routinely check the bladder neck, trigone and the veru to focus vaporization on the prostate tissue. I did not vaporize passed that veru and may have left some apical tissue but having removed the median lobe again there was an excellent channel and this seemed to be the primary obstructive component visually. Hemostasis was excellent at low-pressure.  I could not washout the larger piece of the median lobe with the laser scope, therefore the cystoscope was passed in through the cystoscope I used a grasper to pull out the median lobe piece. The bladder was filled and the scope removed. A 20 French coud catheter was placed without difficulty and left gravity drainage and draining clear urine.   The patient was then placed supine and the catheter taped going toward the abdomen to keep it out of the field. The genitalia were then re\re prepped and draped. A second timeout was then performed prior to the left spermatocelectomy. The left hemiscrotum was infiltrated with local and an incision made which was  taken through the darkest fascia. The testicle was then delivered and the tunica vaginalis opened. A small amount hydrocele fluid was drained and the testicle and a large multicystic structure was delivered from the tunica vaginalis confirming a multicystic spermatocele. I then opened the opened and carefully dissected the visceral layer of the tunica  vaginalis off the spermatocele. The spermatocele was carefully dissected off the underlying vas deferens and cord structures. The spermatocele was dissected back to the head of the epididymis where was transected. The base of it fulgurated. I then closed the tunica vaginalis covering the epididymis to obliterate the space. There was not enough tunica vaginalis to evert it is seen in the typical hydrocele repair therefore I just left in place but did excise a small amount of excess tunica vaginalis. There was good hemostasis. The testicle and the tissues were irrigated. The testicle was placed back in the left hemiscrotum without difficulty. The darkest fascia was closed with a running 2-0 Vicryl suture. The skin was closed with interrupted horizontal mattress chromic. A Telfa fluffs and a jock strap were placed after the patient was cleaned up.  The patient's urine remained clear. He was awakened taken to recovery room in stable condition.  Complications: None Blood loss: Minimal Specimens: None  Drains: 20 [place] Foley

## 2013-10-04 NOTE — Anesthesia Postprocedure Evaluation (Signed)
  Anesthesia Post-op Note  Patient: Edward BeetsVictor X Bierly  Procedure(s) Performed: Procedure(s) (LRB): GREEN LIGHT LASER TURP (TRANSURETHRAL RESECTION OF PROSTATE (N/A) LEFT SPERMATOCELECTOMY (Left)  Patient Location: PACU  Anesthesia Type: General  Level of Consciousness: awake and alert   Airway and Oxygen Therapy: Patient Spontanous Breathing  Post-op Pain: mild  Post-op Assessment: Post-op Vital signs reviewed, Patient's Cardiovascular Status Stable, Respiratory Function Stable, Patent Airway and No signs of Nausea or vomiting  Last Vitals:  Filed Vitals:   10/04/13 1324  BP: 111/64  Pulse: 56  Temp:   Resp: 16    Post-op Vital Signs: stable   Complications: No apparent anesthesia complications

## 2013-10-04 NOTE — Transfer of Care (Signed)
Immediate Anesthesia Transfer of Care Note  Patient: Edward [name]  Procedure(s) Performed: Procedure(s) (LRB): GREEN LIGHT LASER TURP (TRANSURETHRAL RESECTION OF PROSTATE (N/A) LEFT SPERMATOCELECTOMY (Left)  Patient Location: PACU  Anesthesia Type: General  Level of Consciousness: sedated, patient cooperative and responds to stimulation  Airway & Oxygen Therapy: Patient Spontanous Breathing and Patient connected to face mask oxgen  Post-op Assessment: Report given to PACU RN and Post -op Vital signs reviewed and stable  Post vital signs: Reviewed and stable  Complications: No apparent anesthesia complications

## 2013-10-04 NOTE — Preoperative (Signed)
Beta Blockers   Reason not to administer Beta Blockers:Not Applicable 

## 2013-10-04 NOTE — Interval H&P Note (Signed)
History and Physical Interval Note:  10/04/2013 7:34 AM  Edward [name]  has presented today for surgery, with the diagnosis of BENIGN PROSTHETIC HYPERTROPHY/LEFT SPERMATOCELE  The various methods of treatment have been discussed with the patient and family. After consideration of risks, benefits and other options for treatment, the patient has consented to  Procedure(s): GREEN LIGHT LASER TURP (TRANSURETHRAL RESECTION OF PROSTATE (Left) LEFT SPERMATOCELECTOMY (Left) as a surgical intervention .  The patient's history has been reviewed, patient examined, no change in status, stable for surgery.  I have reviewed the patient's chart and labs.  I discussed with the patient the nature, potential benefits, risks and alternatives to Greenlight PVP and left spermatocelectomy, including side effects of the proposed treatment, the likelihood of the patient achieving the goals of the procedure, and any potential problems that might occur during the procedure or recuperation. All questions answered. Patient elects to proceed. He reports he has right groin pain and I discussed this is likely not related to the left spermatocele and will probably not change. We discussed the swelling and discomfort of the left spermatocele may be the same for several weeks following scrotal surgery. Also he asked about the irritative symptoms following GL. He had a cousin who had the Greenlight and reported a lot of burning/dysuria following the procedure. We discussed this is another risk with GL and there is some thought that with this newest generation of the laser the vaporization is better with less coagulation, hopefully leading to less dysuria. We discussed on occasion a patient may even need a steroid dose pack to limit the inflammation and hopefully the symptoms. Again, all questions were answered and he elects to proceed.      [name], [name]

## 2013-10-07 ENCOUNTER — Encounter: Payer: Self-pay | Admitting: Cardiology

## 2014-12-09 ENCOUNTER — Emergency Department (HOSPITAL_COMMUNITY)
Admission: EM | Admit: 2014-12-09 | Discharge: 2014-12-09 | Disposition: A | Discharge status: Home / Self Care | Payer: Medicare Other | Attending: Emergency Medicine | Admitting: Emergency Medicine

## 2014-12-09 ENCOUNTER — Encounter (HOSPITAL_COMMUNITY): Payer: Self-pay | Admitting: Emergency Medicine

## 2014-12-09 DIAGNOSIS — Z87442 Personal history of urinary calculi: Secondary | ICD-10-CM | POA: Insufficient documentation

## 2014-12-09 DIAGNOSIS — I2510 Atherosclerotic heart disease of native coronary artery without angina pectoris: Secondary | ICD-10-CM | POA: Insufficient documentation

## 2014-12-09 DIAGNOSIS — Z792 Long term (current) use of antibiotics: Secondary | ICD-10-CM | POA: Diagnosis not present

## 2014-12-09 DIAGNOSIS — Z8669 Personal history of other diseases of the nervous system and sense organs: Secondary | ICD-10-CM | POA: Insufficient documentation

## 2014-12-09 DIAGNOSIS — R350 Frequency of micturition: Secondary | ICD-10-CM | POA: Diagnosis not present

## 2014-12-09 DIAGNOSIS — R3915 Urgency of urination: Secondary | ICD-10-CM | POA: Diagnosis not present

## 2014-12-09 DIAGNOSIS — Z79899 Other long term (current) drug therapy: Secondary | ICD-10-CM | POA: Insufficient documentation

## 2014-12-09 DIAGNOSIS — Z87891 Personal history of nicotine dependence: Secondary | ICD-10-CM | POA: Insufficient documentation

## 2014-12-09 DIAGNOSIS — Z9861 Coronary angioplasty status: Secondary | ICD-10-CM | POA: Diagnosis not present

## 2014-12-09 DIAGNOSIS — R319 Hematuria, unspecified: Secondary | ICD-10-CM

## 2014-12-09 DIAGNOSIS — R300 Dysuria: Secondary | ICD-10-CM | POA: Insufficient documentation

## 2014-12-09 DIAGNOSIS — Z7982 Long term (current) use of aspirin: Secondary | ICD-10-CM | POA: Insufficient documentation

## 2014-12-09 DIAGNOSIS — I10 Essential (primary) hypertension: Secondary | ICD-10-CM | POA: Diagnosis not present

## 2014-12-09 DIAGNOSIS — N400 Enlarged prostate without lower urinary tract symptoms: Secondary | ICD-10-CM | POA: Diagnosis not present

## 2014-12-09 DIAGNOSIS — M1990 Unspecified osteoarthritis, unspecified site: Secondary | ICD-10-CM | POA: Insufficient documentation

## 2014-12-09 DIAGNOSIS — E785 Hyperlipidemia, unspecified: Secondary | ICD-10-CM | POA: Insufficient documentation

## 2014-12-09 LAB — CBC WITH DIFFERENTIAL/PLATELET
Basophils Absolute: 0 10*3/uL (ref 0.0–0.1)
Basophils Relative: 1 % (ref 0–1)
Eosinophils Absolute: 0.4 10*3/uL (ref 0.0–0.7)
Eosinophils Relative: 7 % — ABNORMAL HIGH (ref 0–5)
HCT: 37.5 % — ABNORMAL LOW (ref 39.0–52.0)
HEMOGLOBIN: 12.4 g/dL — AB (ref 13.0–17.0)
LYMPHS PCT: 25 % (ref 12–46)
Lymphs Abs: 1.5 10*3/uL (ref 0.7–4.0)
MCH: 30.4 pg (ref 26.0–34.0)
MCHC: 33.1 g/dL (ref 30.0–36.0)
MCV: 91.9 fL (ref 78.0–100.0)
Monocytes Absolute: 0.6 10*3/uL (ref 0.1–1.0)
Monocytes Relative: 10 % (ref 3–12)
NEUTROS ABS: 3.5 10*3/uL (ref 1.7–7.7)
Neutrophils Relative %: 57 % (ref 43–77)
Platelets: 220 10*3/uL (ref 150–400)
RBC: 4.08 MIL/uL — ABNORMAL LOW (ref 4.22–5.81)
RDW: 13.1 % (ref 11.5–15.5)
WBC: 6 10*3/uL (ref 4.0–10.5)

## 2014-12-09 LAB — COMPREHENSIVE METABOLIC PANEL
ALK PHOS: 73 U/L (ref 39–117)
ALT: 19 U/L (ref 0–53)
ANION GAP: 7 (ref 5–15)
AST: 20 U/L (ref 0–37)
Albumin: 4.5 g/dL (ref 3.5–5.2)
BILIRUBIN TOTAL: 0.6 mg/dL (ref 0.3–1.2)
BUN: 15 mg/dL (ref 6–23)
CO2: 26 mmol/L (ref 19–32)
CREATININE: 0.94 mg/dL (ref 0.50–1.35)
Calcium: 9.3 mg/dL (ref 8.4–10.5)
Chloride: 106 mmol/L (ref 96–112)
GFR calc non Af Amer: 82 mL/min — ABNORMAL LOW (ref >90)
GLUCOSE: 112 mg/dL — AB (ref 70–99)
Potassium: 4 mmol/L (ref 3.5–5.1)
Sodium: 139 mmol/L (ref 135–145)
Total Protein: 7.8 g/dL (ref 6.0–8.3)

## 2014-12-09 LAB — URINALYSIS, ROUTINE W REFLEX MICROSCOPIC
BILIRUBIN URINE: NEGATIVE
Glucose, UA: NEGATIVE mg/dL
Ketones, ur: NEGATIVE mg/dL
LEUKOCYTES UA: NEGATIVE
Nitrite: NEGATIVE
Protein, ur: >300 mg/dL — AB
Urobilinogen, UA: 0.2 mg/dL (ref 0.0–1.0)
pH: 6.5 (ref 5.0–8.0)

## 2014-12-09 LAB — URINE MICROSCOPIC-ADD ON

## 2014-12-09 MED ORDER — LIDOCAINE HCL 2 % EX GEL
CUTANEOUS | Status: AC
  Filled 2014-12-09: qty 10

## 2014-12-09 MED ORDER — LIDOCAINE HCL 2 % EX GEL: 1.0000 "application " | Freq: Once | CUTANEOUS | Status: DC

## 2014-12-09 NOTE — ED Notes (Signed)
Pt. Reminded to collect urine for U/A, verbalized understanding. 

## 2014-12-09 NOTE — Discharge Instructions (Signed)

## 2014-12-09 NOTE — ED Provider Notes (Signed)
CSN: [id number]     Arrival date & time 12/09/14  0502 History   First MD Initiated Contact with Patient 12/09/14 [phone]     Chief Complaint  Patient presents with  . Hematuria   Edward [name] is a 74 y.o. male with a history of kidney stones, BPH, TURP and hypertension who presents to emergency department complaining of frank blood in his urine for the past week. The patient reports he is followed by urologist Dr. Mena Goes and was seen in his office this past week and a CT scan was done yesterday. He reports they informed him his hemoglobin was normal and they did a urinalysis. He has not been told of the CT results. He has an appointment with Dr. Mena Goes in 5 days. He presents to the ED because he is concerned where the blood is coming from. He reports he has been able to urinate, but has been passing clots and sometimes he has difficulty urinating. He reports increased frequency and urgency to urinate. He denies dysuria. He denies any pain.  The patient denies fevers, chills, abdominal pain, nausea, vomiting, flank pain, lightheadedness, dizziness, shortness of breath or chest pain.  (Consider location/radiation/quality/duration/timing/severity/associated sxs/prior Treatment) HPI  Past Medical History  Diagnosis Date  . Hypertension   . Hyperlipidemia   . History of kidney stones   . Elevated PSA   . Arthritis   . Frequency of urination   . Urgency of urination   . Nocturia   . Coronary artery disease CARIOLOGIST-- DR Donnie Aho    ANGIOPLASTY TO LAD  . Spermatocele     left  . BPH (benign prostatic hypertrophy)   . At risk for sleep apnea     STOP-BANG = 4    SENT TO PCP 02-10-215   Past Surgical History  Procedure Laterality Date  . Umbilical hernia repair  06-02-2008  . Shoulder arthroscopy with debridement and bicep tendon repair Left 04-28-2011  . Inguinal hernia repair Bilateral   . Shoulder arthroscopy Right 2004  . Foot fusion Left     SECONDARY TO FX'S  . Coronary  angioplasty  1995  DR [place]    LAD  . Cystoscopy with retrograde pyelogram, ureteroscopy and stent placement Left 03/01/2013    Procedure: CYSTOSCOPY WITH LEFT RETROGRADE PYELOGRAM, LEFT URETEROSCOPY and stent PLACEMENT;  Surgeon: Antony Haste, MD;  Location: [facility];  Service: Urology;  Laterality: Left;  Marland Kitchen Green light laser turp (transurethral resection of prostate N/A 10/04/2013    Procedure: GREEN LIGHT LASER TURP (TRANSURETHRAL RESECTION OF PROSTATE;  Surgeon: Antony Haste, MD;  Location: [facility];  Service: Urology;  Laterality: N/A;  . Spermatocelectomy Left 10/04/2013    Procedure: LEFT SPERMATOCELECTOMY;  Surgeon: Antony Haste, MD;  Location: [facility];  Service: Urology;  Laterality: Left;   History reviewed. No pertinent family history. History  Substance Use Topics  . Smoking status: Former Smoker -- 0.50 packs/day for 3 years    Types: Cigarettes    Quit date: 12/18/1970  . Smokeless tobacco: Never Used  . Alcohol Use: Yes     Comment: OCCASIONAL    Review of Systems  Constitutional: Negative for fever and chills.  HENT: Negative for congestion and sore throat.   Eyes: Negative for visual disturbance.  Respiratory: Negative for cough, shortness of breath and wheezing.   Cardiovascular: Negative for chest pain and palpitations.  Gastrointestinal: Negative for nausea, vomiting, abdominal pain and diarrhea.  Genitourinary: Positive for urgency, frequency, hematuria and  difficulty urinating. Negative for dysuria, flank pain, penile swelling, scrotal swelling, penile pain and testicular pain.  Musculoskeletal: Negative for back pain and neck pain.  Skin: Negative for rash.  Neurological: Negative for dizziness, weakness, light-headedness and headaches.      Allergies  Review of patient's allergies indicates no known allergies.  Home Medications   Prior to Admission medications   Medication Sig Start Date End Date Taking?  Authorizing Provider  amLODipine (NORVASC) 10 MG tablet Take 10 mg by mouth every evening.    Yes   atenolol (TENORMIN) 50 MG tablet Take 50 mg by mouth every evening.   Yes Historical Provider, MD  losartan (COZAAR) 50 MG tablet Take 50 mg by mouth 2 (two) times daily.   Yes Historical Provider, MD  rosuvastatin (CRESTOR) 10 MG tablet Take 10 mg by mouth daily.   Yes Historical Provider, MD  aspirin 81 MG tablet Take 1 tablet (81 mg total) by mouth daily. 10/06/13   Jerilee FieldMatthew Eskridge, MD  cephALEXin (KEFLEX) 500 MG capsule Take 1 capsule (500 mg total) by mouth 2 (two) times daily. Patient not taking: Reported on 12/09/2014 10/04/13   Jerilee FieldMatthew Eskridge, MD  HYDROcodone-acetaminophen (NORCO/VICODIN) 5-325 MG per tablet Take 1 tablet by mouth every 6 (six) hours as needed for pain. Patient not taking: Reported on 12/09/2014 03/01/13   Jerilee FieldMatthew Eskridge, MD  tamsulosin (FLOMAX) 0.4 MG CAPS capsule Take 1 capsule (0.4 mg total) by mouth daily after supper. Patient not taking: Reported on 12/09/2014 10/04/13   Jerilee FieldMatthew Eskridge, MD  traMADol (ULTRAM) 50 MG tablet Take 2 tablets (100 mg total) by mouth every 6 (six) hours as needed for moderate pain. Patient not taking: Reported on 12/09/2014 10/04/13   Jerilee FieldMatthew Eskridge, MD   BP 150/69 mmHg  Pulse 70  Temp(Src) 97.7 F (36.5 C) (Oral)  Resp 18  Ht 5\' 6"  (1.676 m)  Wt 165 lb (74.844 kg)  BMI 26.64 kg/m2  SpO2 100% Physical Exam  Constitutional: He is oriented to person, place, and time. He appears well-developed and well-nourished. No distress.   Nontoxic appearing.  HENT:  Head: Normocephalic and atraumatic.  Mouth/Throat: Oropharynx is clear and moist. No oropharyngeal exudate.  Eyes: Conjunctivae are normal. Pupils are equal, round, and reactive to light. Right eye exhibits no discharge. Left eye exhibits no discharge.  Neck: Neck supple. No JVD present.  Cardiovascular: Normal rate, regular rhythm, normal heart sounds and intact distal pulses.  Exam  reveals no gallop and no friction rub.   No murmur heard. Pulmonary/Chest: Effort normal and breath sounds normal. No respiratory distress. He has no wheezes. He has no rales.  Abdominal: Soft. Bowel sounds are normal. He exhibits no distension and no mass. There is no tenderness. There is no rebound and no guarding.   Abdomen is soft and non-tender to palpation. Bowel sounds are present.  Musculoskeletal: He exhibits no edema.  Lymphadenopathy:    He has no cervical adenopathy.  Neurological: He is alert and oriented to person, place, and time. Coordination normal.  Skin: Skin is warm and dry. No rash noted. He is not diaphoretic. No erythema. No pallor.  Psychiatric: He has a normal mood and affect. His behavior is normal.  Nursing note and vitals reviewed.   ED Course  Procedures (including critical care time) Labs Review Labs Reviewed  COMPREHENSIVE METABOLIC PANEL - Abnormal; Notable for the following:    Glucose, Bld 112 (*)    GFR calc non Af Amer 82 (*)  All other components within normal limits  URINALYSIS, ROUTINE W REFLEX MICROSCOPIC - Abnormal; Notable for the following:    Color, Urine RED (*)    APPearance TURBID (*)    Specific Gravity, Urine >1.030 (*)    Hgb urine dipstick LARGE (*)    Protein, ur >300 (*)    All other components within normal limits  CBC WITH DIFFERENTIAL/PLATELET - Abnormal; Notable for the following:    RBC 4.08 (*)    Hemoglobin 12.4 (*)    HCT 37.5 (*)    Eosinophils Relative 7 (*)    All other components within normal limits  URINE MICROSCOPIC-ADD ON    Imaging Review No results found.   EKG Interpretation None      Filed Vitals:   12/09/14 0511  BP: 150/69  Pulse: 70  Temp: 97.7 F (36.5 C)  TempSrc: Oral  Resp: 18  Height:  (1.676 m)  Weight: 165 lb (74.844 kg)  SpO2: 100%     MDM   Meds given in ED:  Medications  lidocaine (XYLOCAINE) 2 % jelly 1 application (not administered)  lidocaine (XYLOCAINE) 2 %  jelly (not administered)    New Prescriptions   No medications on file    Final diagnoses:  Hematuria   This is a 74 y.o. male with a history of kidney stones, BPH, TURP and hypertension who presents to emergency department complaining of frank blood in his urine for the past week. The patient reports he is followed by urologist Dr. Mena Goes and was seen in his office this past week and a CT scan was done yesterday. He is concerned about the  Source of the hematuria. He reports he has not received the results of his CT scan. The patient reports he is having some intermittent difficulty urinating due to blood clots. On exam the patient is afebrile and nontoxic appearing. His abdomen is soft and nontender to palpation. Bladder scan indicated a volume of 196 mL's.  CMP indicated  A creatinine of 0.94 with a GFR of 82. His CBC indicates a hemoglobin of 12.4.   Urinalysis is nitrite negative. The patient denies feeling lightheaded, dizzy or short of breath. I called and spoke with urologist Dr. Laural Benes who would like [place] to do a bladder irrigation here and have him follow up in the office next week as planned. Dr. Laural Benes came and evaluated the patient as well. The patient tolerated the bladder irrigation well and Dr. Laural Benes was able to clear up the blood.  Dr. Laural Benes tells me he is ready for discharge. The patient was advised to return to the emergency department if he straights feeling short of breath, lightheaded or dizzy. Also advised to return if he has any difficulty urinating. Otherwise patient has follow-up appointment with Dr. Mena Goes this week.  I advised the patient to return to the emergency department with new or worsening symptoms or new concerns. The patient verbalized understanding and agreement with plan.   This patient was discussed with and evaluated by Dr. Norlene Campbell who agrees with assessment and plan.    Everlene Farrier, PA-C 12/09/14 1610  Marisa Severin, MD 12/09/14 [phone]

## 2014-12-09 NOTE — Consult Note (Signed)
Urology Consult   Physician requesting consult: Edward FarrierWilliam Dansie, PA-C, ED  Reason for consult: Hematuria  History of Present Illness: Edward BeetsVictor X [name] is a 74 y.o. male with BPH and nephrolithiasis who underwent Greenlight laser TURP in 09/2014. He had several days of gross hematuria with clot and was seen by Edward [name] in clinic on 4/20. He underwent CT scan yesterday which revealed clot in the bladder. He was voiding reasonably well until this morning when he started having difficulty passing urine and was passing several clots so he came to the ED. Denies fevers, chills, nausea/vomiting.  Past Medical History  Diagnosis Date  . Hypertension   . Hyperlipidemia   . History of kidney stones   . Elevated PSA   . Arthritis   . Frequency of urination   . Urgency of urination   . Nocturia   . Coronary artery disease Edward [name]    ANGIOPLASTY TO LAD  . Spermatocele     left  . BPH (benign prostatic hypertrophy)   . At risk for sleep apnea     STOP-BANG = 4    SENT TO PCP 02-10-215    Past Surgical History  Procedure Laterality Date  . Umbilical hernia repair  06-02-2008  . Shoulder arthroscopy with debridement and bicep tendon repair Left 04-28-2011  . Inguinal hernia repair Bilateral   . Shoulder arthroscopy Right 2004  . Foot fusion Left     SECONDARY TO FX'S  . Coronary angioplasty  1995  Edward [name]    LAD  . Cystoscopy with retrograde pyelogram, ureteroscopy and stent placement Left 03/01/2013    Procedure: CYSTOSCOPY WITH LEFT RETROGRADE PYELOGRAM, LEFT URETEROSCOPY and stent PLACEMENT;  Surgeon: Edward HasteMatthew Ramsey Eskridge, MD;  [facility];  Service: Urology;  Laterality: Left;  Marland Kitchen. Green light laser turp (transurethral resection of prostate N/A 10/04/2013    Procedure: GREEN LIGHT LASER TURP (TRANSURETHRAL RESECTION OF PROSTATE;  Surgeon: Edward HasteMatthew Ramsey Eskridge, MD;  Location: [facility];  Service: Urology;  Laterality: N/A;  . Spermatocelectomy  Left 10/04/2013    Procedure: LEFT SPERMATOCELECTOMY;  Surgeon: Edward HasteMatthew Ramsey Eskridge, MD;  Location: [facility];  Service: Urology;  Laterality: Left;    Medications:  Home meds:    Medication List    ASK your doctor about these medications        amLODipine 10 MG tablet  Commonly known as:  NORVASC  Take 10 mg by mouth every evening.     aspirin 81 MG tablet  Take 1 tablet (81 mg total) by mouth daily.     atenolol 50 MG tablet  Commonly known as:  TENORMIN  Take 50 mg by mouth every evening.     cephALEXin 500 MG capsule  Commonly known as:  KEFLEX  Take 1 capsule (500 mg total) by mouth 2 (two) times daily.     HYDROcodone-acetaminophen 5-325 MG per tablet  Commonly known as:  NORCO/VICODIN  Take 1 tablet by mouth every 6 (six) hours as needed for pain.     losartan 50 MG tablet  Commonly known as:  COZAAR  Take 50 mg by mouth 2 (two) times daily.     rosuvastatin 10 MG tablet  Commonly known as:  CRESTOR  Take 10 mg by mouth daily.     tamsulosin 0.4 MG Caps capsule  Commonly known as:  FLOMAX  Take 1 capsule (0.4 mg total) by mouth daily after supper.     traMADol 50 MG tablet  Commonly  known as:  ULTRAM  Take 2 tablets (100 mg total) by mouth every 6 (six) hours as needed for moderate pain.        Scheduled Meds: . lidocaine  1 application Urethral Once  . lidocaine       Continuous Infusions:  PRN Meds:.  Allergies: No Known Allergies  History reviewed. No pertinent family history.  Social History:  reports that he quit smoking about 44 years ago. His smoking use included Cigarettes. He has a 1.5 pack-year smoking history. He has never used smokeless tobacco. He reports that he drinks alcohol. He reports that he does not use illicit drugs.  ROS: A complete review of systems was performed.  All systems are negative except for pertinent findings as noted.  Physical Exam:  Vital signs in last 24 hours: Temp:  [97.7 F (36.5 C)-97.9 F (36.6 C)]  97.9 F (36.6 C) (04/23 0802) Pulse Rate:  [60-70] 60 (04/23 0802) Resp:  [18] 18 (04/23 0802) BP: (134-150)/(69-78) 134/78 mmHg (04/23 0802) SpO2:  [100 %] 100 % (04/23 0802) Weight:  [165 lb (74.844 kg)] 165 lb (74.844 kg) (04/23 0511) Constitutional:  Alert and oriented, No acute distress Cardiovascular: Regular rate and rhythm, No JVD Respiratory: Normal respiratory effort, Lungs clear bilaterally GI: Abdomen is soft, nontender, nondistended, no abdominal masses Genitourinary: No CVAT. Normal male phallus, testes are descended bilaterally and non-tender and without masses, scrotum is normal in appearance without lesions or masses, perineum is normal on inspection. Lymphatic: No lymphadenopathy Neurologic: Grossly intact, no focal deficits Psychiatric: Normal mood and affect  Laboratory Data:   Recent Labs  12/09/14 0555  WBC 6.0  HGB 12.4*  HCT 37.5*  PLT 220     Recent Labs  12/09/14 0555  NA 139  K 4.0  CL 106  GLUCOSE 112*  BUN 15  CALCIUM 9.3  CREATININE 0.94   Urinalysis    Component Value Date/Time   COLORURINE RED* 12/09/2014 0719   APPEARANCEUR TURBID* 12/09/2014 0719   LABSPEC >1.030* 12/09/2014 0719   PHURINE 6.5 12/09/2014 0719   GLUCOSEU NEGATIVE 12/09/2014 0719   HGBUR LARGE* 12/09/2014 0719   BILIRUBINUR NEGATIVE 12/09/2014 0719   KETONESUR NEGATIVE 12/09/2014 0719   PROTEINUR >300* 12/09/2014 0719   UROBILINOGEN 0.2 12/09/2014 0719   NITRITE NEGATIVE 12/09/2014 0719   LEUKOCYTESUR NEGATIVE 12/09/2014 0719    Procedure: Complicated foley catheter placement with irrigation of clot.   The patient's was verified and the procedure was explained. He was prepped and draped in the usual sterile fashion. Viscous lidocaine was injected into the urethra. A 24Fr 3-way catheter was advanced into the bladder with return of red urine. 10cc were inflated into the balloon. The catheter was irrigated and several hundred cc's of clot were removed. The urine  was then perfectly clear. The foley was hooked to a drainage bag.    Radiologic Imaging: No results found.  I independently reviewed the above imaging studies.  Impression/Recommendation Gross hematuria with clot retention after Greenlight TURP - irrigated out several hundred cc's of clot and left foley in. Has appointment with Edward. Mena Goes next Thursday.

## 2014-12-09 NOTE — ED Notes (Signed)
Pt arrived to the ED with a complaint of hematuria.  Pt has been passing blood from his penis since last Saturday.  Pt states he has progressively getting larger quantities of blood.  Pt states he saw the urologist PA during the week.  A CT scan was done yesterday.  Pt has a hx of kidney stones.

## 2014-12-09 NOTE — ED Notes (Signed)
Bladder scanned done with urine volume of 196 ml. Pt. Denies any pain.

## 2014-12-10 ENCOUNTER — Encounter (HOSPITAL_COMMUNITY): Payer: Self-pay | Admitting: Emergency Medicine

## 2014-12-10 ENCOUNTER — Emergency Department (HOSPITAL_COMMUNITY)
Admission: EM | Admit: 2014-12-10 | Discharge: 2014-12-11 | Disposition: A | Discharge status: Home / Self Care | Payer: Medicare Other | Attending: Emergency Medicine | Admitting: Emergency Medicine

## 2014-12-10 ENCOUNTER — Emergency Department (HOSPITAL_COMMUNITY)
Admission: EM | Admit: 2014-12-10 | Discharge: 2014-12-10 | Disposition: A | Discharge status: Home / Self Care | Payer: Medicare Other | Attending: Emergency Medicine | Admitting: Emergency Medicine

## 2014-12-10 DIAGNOSIS — Z87891 Personal history of nicotine dependence: Secondary | ICD-10-CM | POA: Insufficient documentation

## 2014-12-10 DIAGNOSIS — Z79899 Other long term (current) drug therapy: Secondary | ICD-10-CM | POA: Insufficient documentation

## 2014-12-10 DIAGNOSIS — I2510 Atherosclerotic heart disease of native coronary artery without angina pectoris: Secondary | ICD-10-CM | POA: Insufficient documentation

## 2014-12-10 DIAGNOSIS — Z7982 Long term (current) use of aspirin: Secondary | ICD-10-CM | POA: Insufficient documentation

## 2014-12-10 DIAGNOSIS — Z9861 Coronary angioplasty status: Secondary | ICD-10-CM | POA: Insufficient documentation

## 2014-12-10 DIAGNOSIS — E785 Hyperlipidemia, unspecified: Secondary | ICD-10-CM | POA: Insufficient documentation

## 2014-12-10 DIAGNOSIS — T839XXD Unspecified complication of genitourinary prosthetic device, implant and graft, subsequent encounter: Secondary | ICD-10-CM

## 2014-12-10 DIAGNOSIS — M1990 Unspecified osteoarthritis, unspecified site: Secondary | ICD-10-CM | POA: Insufficient documentation

## 2014-12-10 DIAGNOSIS — Z9889 Other specified postprocedural states: Secondary | ICD-10-CM | POA: Diagnosis not present

## 2014-12-10 DIAGNOSIS — I10 Essential (primary) hypertension: Secondary | ICD-10-CM | POA: Insufficient documentation

## 2014-12-10 DIAGNOSIS — T83098D Other mechanical complication of other indwelling urethral catheter, subsequent encounter: Secondary | ICD-10-CM | POA: Insufficient documentation

## 2014-12-10 DIAGNOSIS — Z87448 Personal history of other diseases of urinary system: Secondary | ICD-10-CM | POA: Insufficient documentation

## 2014-12-10 DIAGNOSIS — Z87438 Personal history of other diseases of male genital organs: Secondary | ICD-10-CM | POA: Diagnosis not present

## 2014-12-10 DIAGNOSIS — Y846 Urinary catheterization as the cause of abnormal reaction of the patient, or of later complication, without mention of misadventure at the time of the procedure: Secondary | ICD-10-CM | POA: Diagnosis not present

## 2014-12-10 DIAGNOSIS — Z87442 Personal history of urinary calculi: Secondary | ICD-10-CM | POA: Diagnosis not present

## 2014-12-10 DIAGNOSIS — R339 Retention of urine, unspecified: Secondary | ICD-10-CM | POA: Diagnosis present

## 2014-12-10 DIAGNOSIS — Z8669 Personal history of other diseases of the nervous system and sense organs: Secondary | ICD-10-CM | POA: Insufficient documentation

## 2014-12-10 DIAGNOSIS — R319 Hematuria, unspecified: Secondary | ICD-10-CM | POA: Diagnosis not present

## 2014-12-10 LAB — URINE MICROSCOPIC-ADD ON

## 2014-12-10 LAB — I-STAT CHEM 8, ED
BUN: 14 mg/dL (ref 6–23)
Calcium, Ion: 1.2 mmol/L (ref 1.13–1.30)
Chloride: 102 mmol/L (ref 96–112)
Creatinine, Ser: 1 mg/dL (ref 0.50–1.35)
Glucose, Bld: 111 mg/dL — ABNORMAL HIGH (ref 70–99)
HCT: 36 % — ABNORMAL LOW (ref 39.0–52.0)
Hemoglobin: 12.2 g/dL — ABNORMAL LOW (ref 13.0–17.0)
Potassium: 3.9 mmol/L (ref 3.5–5.1)
Sodium: 137 mmol/L (ref 135–145)
TCO2: 20 mmol/L (ref 0–100)

## 2014-12-10 LAB — URINALYSIS, ROUTINE W REFLEX MICROSCOPIC
Bilirubin Urine: NEGATIVE
Glucose, UA: NEGATIVE mg/dL
Ketones, ur: NEGATIVE mg/dL
Nitrite: NEGATIVE
Protein, ur: >300 mg/dL — AB
Specific Gravity, Urine: 1.027 (ref 1.005–1.030)
Urobilinogen, UA: 1 mg/dL (ref 0.0–1.0)
pH: 5.5 (ref 5.0–8.0)

## 2014-12-10 NOTE — ED Provider Notes (Signed)
CSN: [id number]     Arrival date & time 12/10/14  2029 History   First MD Initiated Contact with Patient 12/10/14 2045     Chief Complaint  Patient presents with  . Urinary Retention     (Consider location/radiation/quality/duration/timing/severity/associated sxs/prior Treatment) HPI Patient presents to the emergency department with continued hematuria.  The patient was concerned because he started having what felt like urinary retention and came straight to the emergency department.  Patient is very frustrated at the fact that he has been bleeding for 9 days.  Nobody seems to care.  Patient was seen by urology here in the emergency department last night.  Patient had a larger Foley catheter placed with irrigation to the catheter.  The patient denies chest pain, shortness of breath, abdominal pain, nausea, vomiting, weakness, dizziness, headache, blurred vision, back pain, neck pain, fever or syncope Past Medical History  Diagnosis Date  . Hypertension   . Hyperlipidemia   . History of kidney stones   . Elevated PSA   . Arthritis   . Frequency of urination   . Urgency of urination   . Nocturia   . Coronary artery disease CARIOLOGIST-- DR Donnie AhoILLEY    ANGIOPLASTY TO LAD  . Spermatocele     left  . BPH (benign prostatic hypertrophy)   . At risk for sleep apnea     STOP-BANG = 4    SENT TO PCP 02-10-215   Past Surgical History  Procedure Laterality Date  . Umbilical hernia repair  06-02-2008  . Shoulder arthroscopy with debridement and bicep tendon repair Left 04-28-2011  . Inguinal hernia repair Bilateral   . Shoulder arthroscopy Right 2004  . Foot fusion Left     SECONDARY TO FX'S  . Coronary angioplasty  1995  DR [place]    LAD  . Cystoscopy with retrograde pyelogram, ureteroscopy and stent placement Left 03/01/2013    Procedure: CYSTOSCOPY WITH LEFT RETROGRADE PYELOGRAM, LEFT URETEROSCOPY and stent PLACEMENT;  Surgeon: Antony HasteMatthew Ramsey Eskridge, MD;  [facility];  Service: Urology;  Laterality: Left;  Marland Kitchen. Green light laser turp (transurethral resection of prostate N/A 10/04/2013    Procedure: GREEN LIGHT LASER TURP (TRANSURETHRAL RESECTION OF PROSTATE;  Surgeon: Antony HasteMatthew Ramsey Eskridge, MD;  Location: [facility];  Service: Urology;  Laterality: N/A;  . Spermatocelectomy Left 10/04/2013    Procedure: LEFT SPERMATOCELECTOMY;  Surgeon: Antony HasteMatthew Ramsey Eskridge, MD;  Location: [facility];  Service: Urology;  Laterality: Left;   History reviewed. No pertinent family history. History  Substance Use Topics  . Smoking status: Former Smoker -- 0.50 packs/day for 3 years    Types: Cigarettes    Quit date: 12/18/1970  . Smokeless tobacco: Never Used  . Alcohol Use: Yes     Comment: OCCASIONAL    Review of Systems All other systems negative except as documented in the HPI. All pertinent positives and negatives as reviewed in the HPI.  Allergies  Review of patient's allergies indicates no known allergies.  Home Medications   Prior to Admission medications   Medication Sig Start Date End Date Taking? Authorizing Provider  amLODipine (NORVASC) 10 MG tablet Take 5 mg by mouth 2 (two) times daily.    Yes   aspirin 81 MG tablet Take 1 tablet (81 mg total) by mouth daily. 10/06/13  Yes Jerilee FieldMatthew Eskridge, MD  atenolol (TENORMIN) 50 MG tablet Take 50 mg by mouth every evening.   Yes Historical Provider, MD  losartan (COZAAR) 50 MG tablet Take 50  mg by mouth 2 (two) times daily.   Yes Historical Provider, MD  rosuvastatin (CRESTOR) 10 MG tablet Take 10 mg by mouth daily.   Yes Historical Provider, MD  traMADol (ULTRAM) 50 MG tablet Take 25 mg by mouth every 6 (six) hours as needed for moderate pain.   Yes Historical Provider, MD  cephALEXin (KEFLEX) 500 MG capsule Take 1 capsule (500 mg total) by mouth 2 (two) times daily. Patient not taking: Reported on 12/09/2014 10/04/13   Jerilee Field, MD  HYDROcodone-acetaminophen (NORCO/VICODIN) 5-325 MG per tablet Take 1  tablet by mouth every 6 (six) hours as needed for pain. Patient not taking: Reported on 12/09/2014 03/01/13   Jerilee Field, MD  tamsulosin (FLOMAX) 0.4 MG CAPS capsule Take 1 capsule (0.4 mg total) by mouth daily after supper. Patient not taking: Reported on 12/09/2014 10/04/13   Jerilee Field, MD  traMADol (ULTRAM) 50 MG tablet Take 2 tablets (100 mg total) by mouth every 6 (six) hours as needed for moderate pain. Patient not taking: Reported on 12/09/2014 10/04/13   Jerilee Field, MD   BP 123/73 mmHg  Pulse 64  Temp(Src) 99.1 F (37.3 C) (Oral)  Resp 16  SpO2 99% Physical Exam  Constitutional: He is oriented to person, place, and time. He appears well-developed and well-nourished.  HENT:  Head: Normocephalic and atraumatic.  Eyes: Pupils are equal, round, and reactive to light.  Neck: Normal range of motion. Neck supple.  Cardiovascular: Normal rate, regular rhythm and normal heart sounds.  Exam reveals no gallop and no friction rub.   No murmur heard. Pulmonary/Chest: Effort normal and breath sounds normal.  Abdominal: Soft. Bowel sounds are normal. He exhibits no distension. There is no tenderness.  Musculoskeletal: He exhibits no edema.  Neurological: He is alert and oriented to person, place, and time. He exhibits normal muscle tone. Coordination normal.  Skin: Skin is warm and dry. No rash noted. No erythema.  Nursing note and vitals reviewed.   ED Course  Procedures (including critical care time) Labs Review Labs Reviewed  URINALYSIS, ROUTINE W REFLEX MICROSCOPIC - Abnormal; Notable for the following:    Color, Urine RED (*)    APPearance TURBID (*)    Hgb urine dipstick LARGE (*)    Protein, ur >300 (*)    Leukocytes, UA LARGE (*)    All other components within normal limits  I-STAT CHEM 8, ED - Abnormal; Notable for the following:    Glucose, Bld 111 (*)    Hemoglobin 12.2 (*)    HCT 36.0 (*)    All other components within normal limits  URINE  MICROSCOPIC-ADD ON    I spoke with the urologist on-call, who wants the patient to call first thing in the morning to see Dr. Mena Goes tomorrow.  The patient is given the information and agrees to the plan.   Charlestine Night, PA-C 12/11/14 1610  Bethann Berkshire, MD 12/12/14 [phone]

## 2014-12-10 NOTE — Discharge Instructions (Signed)
Call urology to more morning. Return to the ER if unable to get into urology or you have urinary retention lasting longer than 2 hours, You feel lightheaded or pass out, fevers.  If you were given medicines take as directed.  If you are on coumadin or contraceptives realize their levels and effectiveness is altered by many different medicines.  If you have any reaction (rash, tongues swelling, other) to the medicines stop taking and see a physician.   Please follow up as directed and return to the ER or see a physician for new or worsening symptoms.  Thank you. Filed Vitals:   12/10/14 1204 12/10/14 1306  BP: 107/65 130/67  Pulse: 71 70  Temp: 98.6 F (37 C) 98 F (36.7 C)  TempSrc: Oral Oral  Resp: 16 20  SpO2: 96% 97%

## 2014-12-10 NOTE — ED Notes (Signed)
Attempted to obtain urine sample from catheter at the distal port. Since unable to obtain urine, kinked tube to obtain sample. Provider aware of wait for urine.

## 2014-12-10 NOTE — ED Notes (Signed)
Pt very anxious and agitated in triage.

## 2014-12-10 NOTE — ED Notes (Signed)
Pt from home complaining that his catheter yesterday at this facility yesterday and has only seen little return in catheter bag. Pt denies pain in abdomen or back but reports burning at the catheter insertion site. Pt sts that he started to have nausea this am. Pt is A&O, ambulatory, and in NAD

## 2014-12-10 NOTE — ED Notes (Signed)
Pt states he is concerned that there is not always urine in the the tubing. Noted to have bloody urine in tubing and bag upon arrival.

## 2014-12-10 NOTE — ED Notes (Signed)
Patient reports the tip of his penis where the catheter is in place, is painful. Also, pt states he feels like he needs to urinate without the catheter and his bladder is full/bloated. Pt is frustrated cause he wants treatment of symptoms.

## 2014-12-10 NOTE — ED Notes (Signed)
Pt reports having catheter place Saturday morning. Pt reports sometimes catheter drains and sometimes it doesn't. Pt states his bladder feels like it is distended.

## 2014-12-10 NOTE — ED Provider Notes (Signed)
CSN: [id number]     Arrival date & time 12/10/14  1140 History   First MD Initiated Contact with Patient 12/10/14 1240     Chief Complaint  Patient presents with  . catheter problem       (Consider location/radiation/quality/duration/timing/severity/associated sxs/prior Treatment) HPI Comments: 74 year old male with coronary artery disease, BPH presents with urinary retention hematuria. Patient seen yesterday in ER and urology irrigate the bladder with improvement in symptoms. Patient had blood work done which is unremarkable. Patient has close follow-up outpatient urology. This morning patient had an episode of transient word decreased urine into his Foley bag, this resolved with time. Patient has had intermittent clots and bleeding for a week. No lightheadedness or syncope, no blood thinners. No fevers and no current pain.  The history is provided by the patient and medical records.    Past Medical History  Diagnosis Date  . Hypertension   . Hyperlipidemia   . History of kidney stones   . Elevated PSA   . Arthritis   . Frequency of urination   . Urgency of urination   . Nocturia   . Coronary artery disease CARIOLOGIST-- DR Donnie Aho    ANGIOPLASTY TO LAD  . Spermatocele     left  . BPH (benign prostatic hypertrophy)   . At risk for sleep apnea     STOP-BANG = 4    SENT TO PCP 02-10-215   Past Surgical History  Procedure Laterality Date  . Umbilical hernia repair  06-02-2008  . Shoulder arthroscopy with debridement and bicep tendon repair Left 04-28-2011  . Inguinal hernia repair Bilateral   . Shoulder arthroscopy Right 2004  . Foot fusion Left     SECONDARY TO FX'S  . Coronary angioplasty  1995  DR [place]    LAD  . Cystoscopy with retrograde pyelogram, ureteroscopy and stent placement Left 03/01/2013    Procedure: CYSTOSCOPY WITH LEFT RETROGRADE PYELOGRAM, LEFT URETEROSCOPY and stent PLACEMENT;  Surgeon: Antony Haste, MD;  Location: [facility];   Service: Urology;  Laterality: Left;  Marland Kitchen Green light laser turp (transurethral resection of prostate N/A 10/04/2013    Procedure: GREEN LIGHT LASER TURP (TRANSURETHRAL RESECTION OF PROSTATE;  Surgeon: Antony Haste, MD;  Location: [facility];  Service: Urology;  Laterality: N/A;  . Spermatocelectomy Left 10/04/2013    Procedure: LEFT SPERMATOCELECTOMY;  Surgeon: Antony Haste, MD;  Location: [facility];  Service: Urology;  Laterality: Left;   No family history on file. History  Substance Use Topics  . Smoking status: Former Smoker -- 0.50 packs/day for 3 years    Types: Cigarettes    Quit date: 12/18/1970  . Smokeless tobacco: Never Used  . Alcohol Use: Yes     Comment: OCCASIONAL    Review of Systems  Constitutional: Negative for fever and chills.  Respiratory: Negative for shortness of breath.   Cardiovascular: Negative for chest pain.  Gastrointestinal: Negative for vomiting and abdominal pain.  Genitourinary: Positive for hematuria and difficulty urinating. Negative for dysuria and flank pain.  Musculoskeletal: Negative for back pain, neck pain and neck stiffness.  Skin: Negative for rash.  Neurological: Negative for light-headedness and headaches.      Allergies  Review of patient's allergies indicates no known allergies.  Home Medications   Prior to Admission medications   Medication Sig Start Date End Date Taking? Authorizing Provider  amLODipine (NORVASC) 10 MG tablet Take 5 mg by mouth 2 (two) times daily.    Yes  aspirin 81 MG tablet Take 1 tablet (81 mg total) by mouth daily. 10/06/13  Yes Jerilee FieldMatthew Eskridge, MD  atenolol (TENORMIN) 50 MG tablet Take 50 mg by mouth every evening.   Yes Historical Provider, MD  losartan (COZAAR) 50 MG tablet Take 50 mg by mouth 2 (two) times daily.   Yes Historical Provider, MD  rosuvastatin (CRESTOR) 10 MG tablet Take 10 mg by mouth daily.   Yes Historical Provider, MD  cephALEXin (KEFLEX) 500 MG capsule Take 1 capsule (500  mg total) by mouth 2 (two) times daily. Patient not taking: Reported on 12/09/2014 10/04/13   Jerilee FieldMatthew Eskridge, MD  HYDROcodone-acetaminophen (NORCO/VICODIN) 5-325 MG per tablet Take 1 tablet by mouth every 6 (six) hours as needed for pain. Patient not taking: Reported on 12/09/2014 03/01/13   Jerilee FieldMatthew Eskridge, MD  tamsulosin (FLOMAX) 0.4 MG CAPS capsule Take 1 capsule (0.4 mg total) by mouth daily after supper. Patient not taking: Reported on 12/09/2014 10/04/13   Jerilee FieldMatthew Eskridge, MD  traMADol (ULTRAM) 50 MG tablet Take 2 tablets (100 mg total) by mouth every 6 (six) hours as needed for moderate pain. Patient not taking: Reported on 12/09/2014 10/04/13   Jerilee FieldMatthew Eskridge, MD   BP 130/67 mmHg  Pulse 70  Temp(Src) 98 F (36.7 C) (Oral)  Resp 20  SpO2 97% Physical Exam  Constitutional: He is oriented to person, place, and time. He appears well-developed and well-nourished.  HENT:  Head: Normocephalic and atraumatic.  Eyes: Conjunctivae are normal. Right eye exhibits no discharge. Left eye exhibits no discharge.  Neck: Normal range of motion. Neck supple. No tracheal deviation present.  Cardiovascular: Normal rate and regular rhythm.   Pulmonary/Chest: Effort normal and breath sounds normal.  Abdominal: Soft. He exhibits no distension. There is no tenderness. There is no guarding.  Genitourinary:  Foley catheter in place, pink discoloration to urine, flowing without obvious difficulty.  Musculoskeletal: He exhibits no edema.  Neurological: He is alert and oriented to person, place, and time.  Skin: Skin is warm. No rash noted.  Psychiatric: He has a normal mood and affect.  Nursing note and vitals reviewed.   ED Course  Procedures (including critical care time) Labs Review Labs Reviewed - No data to display  Imaging Review No results found.   EKG Interpretation None      MDM   Final diagnoses:  Hematuria  Foley catheter problem, subsequent encounter   Patient presents with  transient urinary retention which resolved likely secondary to blood clots. Patient has urology follow-up. Page urology for urgent follow-up since second visit to the ER in 2 days. Patient well-appearing no pain no fever.  Recent blood work reviewed hemoglobin unremarkable. Patient observed in the ER, no acute retention issues. Urology page for urgent outpatient follow-up. Results and differential diagnosis were discussed with the patient/parent/guardian. Close follow up outpatient was discussed, comfortable with the plan.   Medications - No data to display  Filed Vitals:   12/10/14 1204 12/10/14 1306  BP: 107/65 130/67  Pulse: 71 70  Temp: 98.6 F (37 C) 98 F (36.7 C)  TempSrc: Oral Oral  Resp: 16 20  SpO2: 96% 97%    Final diagnoses:  Hematuria  Foley catheter problem, subsequent encounter       Blane OharaJoshua Donley Harland, MD 12/10/14 1437

## 2014-12-10 NOTE — ED Notes (Signed)
Bed: WA18 Expected date:  Expected time:  Means of arrival:  Comments: HOLD 

## 2014-12-11 ENCOUNTER — Encounter (HOSPITAL_COMMUNITY)
Admission: AD | Disposition: A | Discharge status: Home / Self Care | Payer: Self-pay | Source: Ambulatory Visit | Attending: Urology

## 2014-12-11 ENCOUNTER — Other Ambulatory Visit: Payer: Self-pay | Admitting: Urology

## 2014-12-11 ENCOUNTER — Ambulatory Visit (HOSPITAL_COMMUNITY): Payer: Medicare Other | Admitting: Anesthesiology

## 2014-12-11 ENCOUNTER — Encounter (HOSPITAL_COMMUNITY): Payer: Self-pay | Admitting: *Deleted

## 2014-12-11 ENCOUNTER — Ambulatory Visit (HOSPITAL_COMMUNITY)
Admission: AD | Admit: 2014-12-11 | Discharge: 2014-12-11 | Disposition: A | Discharge status: Home / Self Care | Payer: Medicare Other | Source: Ambulatory Visit | Attending: Urology | Admitting: Urology

## 2014-12-11 DIAGNOSIS — Z79899 Other long term (current) drug therapy: Secondary | ICD-10-CM | POA: Insufficient documentation

## 2014-12-11 DIAGNOSIS — Z87891 Personal history of nicotine dependence: Secondary | ICD-10-CM | POA: Insufficient documentation

## 2014-12-11 DIAGNOSIS — R310 Gross hematuria: Secondary | ICD-10-CM | POA: Insufficient documentation

## 2014-12-11 DIAGNOSIS — N138 Other obstructive and reflux uropathy: Secondary | ICD-10-CM | POA: Diagnosis not present

## 2014-12-11 DIAGNOSIS — Z7982 Long term (current) use of aspirin: Secondary | ICD-10-CM | POA: Diagnosis not present

## 2014-12-11 DIAGNOSIS — N401 Enlarged prostate with lower urinary tract symptoms: Secondary | ICD-10-CM | POA: Diagnosis not present

## 2014-12-11 DIAGNOSIS — N3289 Other specified disorders of bladder: Secondary | ICD-10-CM | POA: Diagnosis present

## 2014-12-11 DIAGNOSIS — E780 Pure hypercholesterolemia: Secondary | ICD-10-CM | POA: Insufficient documentation

## 2014-12-11 DIAGNOSIS — I10 Essential (primary) hypertension: Secondary | ICD-10-CM | POA: Diagnosis not present

## 2014-12-11 HISTORY — PX: TRANSURETHRAL RESECTION OF PROSTATE: SHX73

## 2014-12-11 SURGERY — TURP (TRANSURETHRAL RESECTION OF PROSTATE)
Anesthesia: General | Site: Penis

## 2014-12-11 MED ORDER — FENTANYL CITRATE (PF) 100 MCG/2ML IJ SOLN
25.0000 ug | INTRAMUSCULAR | Status: DC | PRN
Start: 2014-12-11 — End: 2014-12-11

## 2014-12-11 MED ORDER — LACTATED RINGERS IV SOLN
INTRAVENOUS | Status: DC
Start: 2014-12-11 — End: 2014-12-11
  Administered 2014-12-11: 1000 mL via INTRAVENOUS

## 2014-12-11 MED ORDER — OXYCODONE HCL 5 MG PO TABS: 5.0000 mg | ORAL_TABLET | ORAL | Status: DC | PRN

## 2014-12-11 MED ORDER — MEPERIDINE HCL 50 MG/ML IJ SOLN
6.2500 mg | INTRAMUSCULAR | Status: DC | PRN
  Administered 2014-12-11 (×2): 12.5 mg via INTRAVENOUS

## 2014-12-11 MED ORDER — TRAMADOL HCL 50 MG PO TABS: 50.0000 mg | ORAL_TABLET | Freq: Four times a day (QID) | ORAL | Status: DC | PRN

## 2014-12-11 MED ORDER — ETOMIDATE 2 MG/ML IV SOLN
INTRAVENOUS | Status: DC | PRN
  Administered 2014-12-11: 10 mg via INTRAVENOUS

## 2014-12-11 MED ORDER — CIPROFLOXACIN IN D5W 400 MG/200ML IV SOLN
INTRAVENOUS | Status: AC
  Filled 2014-12-11: qty 200

## 2014-12-11 MED ORDER — CIPROFLOXACIN IN D5W 400 MG/200ML IV SOLN
400.0000 mg | INTRAVENOUS | Status: AC
  Administered 2014-12-11: 400 mg via INTRAVENOUS

## 2014-12-11 MED ORDER — FENTANYL CITRATE (PF) 100 MCG/2ML IJ SOLN
INTRAMUSCULAR | Status: AC
  Filled 2014-12-11: qty 2

## 2014-12-11 MED ORDER — LACTATED RINGERS IV SOLN: INTRAVENOUS | Status: DC

## 2014-12-11 MED ORDER — SODIUM CHLORIDE 0.9 % IJ SOLN: 3.0000 mL | INTRAMUSCULAR | Status: DC | PRN

## 2014-12-11 MED ORDER — SODIUM CHLORIDE 0.9 % IJ SOLN: 3.0000 mL | Freq: Two times a day (BID) | INTRAMUSCULAR | Status: DC

## 2014-12-11 MED ORDER — PROMETHAZINE HCL 25 MG/ML IJ SOLN
INTRAMUSCULAR | Status: AC
  Administered 2014-12-11: 6.25 mg via INTRAVENOUS
  Filled 2014-12-11: qty 1

## 2014-12-11 MED ORDER — PROMETHAZINE HCL 25 MG/ML IJ SOLN
6.2500 mg | INTRAMUSCULAR | Status: DC | PRN
  Administered 2014-12-11: 6.25 mg via INTRAVENOUS

## 2014-12-11 MED ORDER — PROPOFOL 10 MG/ML IV BOLUS
INTRAVENOUS | Status: DC | PRN
  Administered 2014-12-11: 100 mg via INTRAVENOUS

## 2014-12-11 MED ORDER — SODIUM CHLORIDE 0.9 % IR SOLN
Status: DC | PRN
  Administered 2014-12-11 (×2): 3000 mL

## 2014-12-11 MED ORDER — FENTANYL CITRATE (PF) 100 MCG/2ML IJ SOLN
25.0000 ug | INTRAMUSCULAR | Status: DC | PRN
  Administered 2014-12-11: 50 ug via INTRAVENOUS

## 2014-12-11 MED ORDER — ACETAMINOPHEN 650 MG RE SUPP
650.0000 mg | RECTAL | Status: DC | PRN
  Filled 2014-12-11: qty 1

## 2014-12-11 MED ORDER — SODIUM CHLORIDE 0.9 % IV SOLN
250.0000 mL | INTRAVENOUS | Status: DC | PRN
Start: 2014-12-11 — End: 2014-12-11

## 2014-12-11 MED ORDER — MEPERIDINE HCL 50 MG/ML IJ SOLN
INTRAMUSCULAR | Status: AC
  Filled 2014-12-11: qty 1

## 2014-12-11 MED ORDER — LACTATED RINGERS IV SOLN
INTRAVENOUS | Status: DC | PRN
  Administered 2014-12-11: 16:00:00 via INTRAVENOUS

## 2014-12-11 MED ORDER — ACETAMINOPHEN 325 MG PO TABS: 650.0000 mg | ORAL_TABLET | ORAL | Status: DC | PRN

## 2014-12-11 MED ORDER — ETOMIDATE 2 MG/ML IV SOLN
INTRAVENOUS | Status: AC
  Filled 2014-12-11: qty 10

## 2014-12-11 MED ORDER — FENTANYL CITRATE (PF) 100 MCG/2ML IJ SOLN
INTRAMUSCULAR | Status: DC | PRN
  Administered 2014-12-11: 25 ug via INTRAVENOUS
  Administered 2014-12-11: 50 ug via INTRAVENOUS
  Administered 2014-12-11: 25 ug via INTRAVENOUS

## 2014-12-11 MED ORDER — PROPOFOL 10 MG/ML IV BOLUS
INTRAVENOUS | Status: AC
  Filled 2014-12-11: qty 20

## 2014-12-11 MED ORDER — CIPROFLOXACIN HCL 500 MG PO TABS: 500.0000 mg | ORAL_TABLET | Freq: Two times a day (BID) | ORAL | Status: DC

## 2014-12-11 MED ORDER — FENTANYL CITRATE (PF) 100 MCG/2ML IJ SOLN
INTRAMUSCULAR | Status: AC
Start: 2014-12-11 — End: 2014-12-11
  Filled 2014-12-11: qty 2

## 2014-12-11 SURGICAL SUPPLY — 22 items
BAG URINE DRAINAGE (UROLOGICAL SUPPLIES) ×3 IMPLANT
BAG URO CATCHER STRL LF (DRAPE) ×4 IMPLANT
BLADE SURG 15 STRL LF DISP TIS (BLADE) IMPLANT
BLADE SURG 15 STRL SS (BLADE)
CATH FOLEY 3WAY 30CC 22FR (CATHETERS) ×3 IMPLANT
CATH ROBINSON RED A/P 16FR (CATHETERS) IMPLANT
CATH URET 5FR 28IN OPEN ENDED (CATHETERS) IMPLANT
CLOTH BEACON ORANGE TIMEOUT ST (SAFETY) ×4 IMPLANT
ELECT REM PT RETURN 9FT ADLT (ELECTROSURGICAL) ×4
ELECTRODE REM PT RTRN 9FT ADLT (ELECTROSURGICAL) ×2 IMPLANT
GLOVE SURG SS PI 8.0 STRL IVOR (GLOVE) IMPLANT
GOWN STRL REUS W/TWL XL LVL3 (GOWN DISPOSABLE) ×4 IMPLANT
HOLDER FOLEY CATH W/STRAP (MISCELLANEOUS) IMPLANT
KIT ASPIRATION TUBING (SET/KITS/TRAYS/PACK) ×4 IMPLANT
LOOP CUT BIPOLAR 24F LRG (ELECTROSURGICAL) ×3 IMPLANT
MANIFOLD NEPTUNE II (INSTRUMENTS) ×4 IMPLANT
PACK CYSTO (CUSTOM PROCEDURE TRAY) ×4 IMPLANT
SUT ETHILON 3 0 PS 1 (SUTURE) IMPLANT
SYR 30ML LL (SYRINGE) ×3 IMPLANT
SYRINGE IRR TOOMEY STRL 70CC (SYRINGE) IMPLANT
TUBING CONNECTING 10 (TUBING) ×3 IMPLANT
TUBING CONNECTING 10' (TUBING) ×1

## 2014-12-11 NOTE — Anesthesia Preprocedure Evaluation (Signed)
Anesthesia Evaluation  Patient identified by MRN, date of birth, ID band Patient awake    Reviewed: Allergy & Precautions, H&P , NPO status , Patient's Chart, lab work & pertinent test results, reviewed documented beta blocker date and time   Airway Mallampati: II  TM Distance: >3 FB Neck ROM: Full    Dental no notable dental hx. (+) Caps, Dental Advisory Given Left upper front is capped:   Pulmonary neg pulmonary ROS, former smoker,  Stop bang 4 breath sounds clear to auscultation  Pulmonary exam normal       Cardiovascular Exercise Tolerance: Good hypertension, Pt. on medications and Pt. on home beta blockers + CAD Rhythm:Regular Rate:Normal  Stress ECHO 01-17-11 was normal.  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 Acute idiopathic pericarditis   Neuro/Psych vertigo negative neurological ROS  negative psych ROS   GI/Hepatic negative GI ROS, Neg liver ROS,   Endo/Other  negative endocrine ROS  Renal/GU negative Renal ROS  negative genitourinary   Musculoskeletal negative musculoskeletal ROS (+)   Abdominal   Peds negative pediatric ROS (+)  Hematology negative hematology ROS (+)   Anesthesia Other Findings   Reproductive/Obstetrics negative OB ROS                             Anesthesia Physical  Anesthesia Plan  ASA: III  Anesthesia Plan: General   Post-op Pain Management:    Induction: Intravenous  Airway Management Planned: LMA  Additional Equipment:   Intra-op Plan:   Post-operative Plan: Extubation in OR  Informed Consent: I have reviewed the patients History and Physical, chart, labs and discussed the procedure including the risks, benefits and alternatives for the proposed anesthesia with the patient or authorized representative who has indicated his/her understanding and acceptance.   Dental advisory given  Plan Discussed with: CRNA and Surgeon  Anesthesia Plan Comments:          Anesthesia Quick Evaluation

## 2014-12-11 NOTE — H&P (Signed)
Active Problems Problems  1. 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 Benign prostatic hyperplasia with urinary obstruction (N40.1,N13.8) 3. Benign prostatic hypertrophy without lower urinary tract symptoms (N40.0) 4. Bilateral kidney stones (N20.0) 5. Calculus of ureter (N20.1) 6. Elevated prostate specific antigen (PSA) (R97.2) 7. Gross hematuria (R31.0) 8. Microscopic hematuria (R31.2) 9. Prostatitis, acute (N41.0) 10. 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 is a patient of Dr. Mena GoesEskridge with a 9 day history of gross hematuria. He has has a foley and has been the ER 3 x for clot retention. He saw Diane last week and wasn't found to have upper tract issues. He was back to the ER twice yesterday. He now reports a poorly draining catheter with pain. He has a history of stones with prior ureteroscopy, a TURP and a Greenlight laser for BPH with BOO. The CT on 4/22 shows small non-obstructing renal stones but there is a lot of clot in the bladder.  he had no voiding difficulty until the bleeding started.   Past Medical History Problems  1. History of hypercholesterolemia (Z86.39) 2. History of hypertension (Z86.79) 3. History of kidney stones (Z87.442) 4. History of Post-angioplasty  Surgical History Problems  1. History of Cystoscopy With Insertion Of Ureteral Stent Left 2. History of Cystoscopy With Ureteroscopy With Removal Of Calculus 3. History of Foot Surgery 4. History of Inguinal Hernia Repair 5. History of Laser Vaporization 6. History of Laser Vaporization With Transurethral Resection Of Prostate 7. History of Shoulder Surgery 8. History of Shoulder Surgery Left 9. History of Surgery Excision Of Spermatocele With Epididymectomy Left 10. History of Umbilical Hernia Repair  Current Meds 1. AmLODIPine Besylate 10 MG Oral Tablet;  Therapy: 12May2011 to Recorded 2. Aspirin 81 MG TABS;  Therapy:  (Recorded:18Nov2013) to Recorded 3. Atenolol 50 MG Oral Tablet;  Therapy: (Recorded:29Aug2008) to Recorded 4. Crestor 10 MG Oral Tablet;  Therapy: 28Jun2010 to Recorded 5. Fish Oil CAPS;  Therapy: (Recorded:20Apr2016) to Recorded 6. Losartan Potassium 50 MG Oral Tablet;  Therapy: 09May2014 to Recorded 7. Vitamin E TABS;  Therapy: (Recorded:20Apr2016) to Recorded  Allergies Medication  1. No Known Drug Allergies  Family History Problems  1. Family history of Death In The Family Father : Father   MVA age 72 2. Family history of CopyMotor Vehicle Traffic Accident : Father  Social History Problems  1. Alcohol Use   Two a week 2. Caffeine Use   3 cups per day 3. Former smoker [phone](Z87.891) 4. Marital History - Currently Married 5. Occupation:   Retired 6. History of Tobacco Use  Review of Systems Genitourinary, constitutional, skin, eye, otolaryngeal, hematologic/lymphatic, cardiovascular, pulmonary, endocrine, musculoskeletal, gastrointestinal, neurological and psychiatric system(s) were reviewed and pertinent findings if present are noted and are otherwise negative.  Genitourinary: suprapubic pain and penile pain.  Constitutional: no fever.  Cardiovascular: no chest pain.  Respiratory: no shortness of breath.    Vitals Vital Signs [Data Includes: Last 1 Day]  Recorded: 25Apr2016 09:15AM  Blood Pressure: 118 / 80 Temperature: 97.5 F Heart Rate: 72  Physical Exam Constitutional: Well nourished and well developed . No acute distress.  Pulmonary: No respiratory distress and normal respiratory rhythm and effort.  Cardiovascular: Heart rate and rhythm are normal . No peripheral edema.    Results/Data  The following images/tracing/specimen were independently visualized:  CT from 4/22 films and report reviewed.  The following clinical lab reports were reviewed:  Hgb is 12.4.    Procedure I irrigated his  foley to clear clots and then he was sent home but will return later  for cystoscopy.     Assessment Assessed  1. Gross hematuria (R31.0) 2. 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   Plan Benign localized prostatic hyperplasia with lower urinary tract symptoms (LUTS)  1. Follow-up Schedule Surgery Office  Follow-up  Status: Complete  Done: 25Apr2016 Gross hematuria  2. Cath & Irrigation; Status:Hold For - Appointment,Date of Service; Requested  for:25Apr2016;   He needs to go the ER for cystoscopy with clot evacuation and possible fulguration. I have reviewed the risks of bleeding, infection, injury to the bladder, stricturing, incontinence, thrombotic events and anesthetic complications.   Discussion/Summary CC: Dr. Larina Earthly.

## 2014-12-11 NOTE — Progress Notes (Addendum)
Patient is recovered in the PACU by Pauline AusPam Chaise Passarella RN for phase II . Discharged from the PACU.  Discharge instructions review with patient's wife, Jarrett AblesMartha Kochanski.

## 2014-12-11 NOTE — Discharge Instructions (Addendum)
CYSTOSCOPY HOME CARE INSTRUCTIONS ° °Activity: °Rest for the remainder of the day.  Do not drive or operate equipment today.  You may resume normal activities in one to two days as instructed by your physician.  ° °Meals: °Drink plenty of liquids and eat light foods such as gelatin or soup this evening.  You may return to a normal meal plan tomorrow. ° °Return to Work: °You may return to work in one to two days or as instructed by your physician. ° °Special Instructions / Symptoms: °Call your physician if any of these symptoms occur: ° ° -persistent or heavy bleeding ° -bleeding which continues after first few urination ° -large blood clots that are difficult to pass ° -urine stream diminishes or stops completely ° -fever equal to or higher than 101 degrees Farenheit. ° -cloudy urine with a strong, foul odor ° -severe pain ° °Females should always wipe from front to back after elimination.  You may feel some burning pain when you urinate.  This should disappear with time.  Applying moist heat to the lower abdomen or a hot tub bath may help relieve the pain. \ ° ° ° °Patient Signature:  ________________________________________________________ ° °Nurse's Signature:  ________________________________________________________ ° ° ° ° ° °General Anesthesia, Care After °Refer to this sheet in the next few weeks. These instructions provide you with information on caring for yourself after your procedure. Your health care provider may also give you more specific instructions. Your treatment has been planned according to current medical practices, but problems sometimes occur. Call your health care provider if you have any problems or questions after your procedure. °WHAT TO EXPECT AFTER THE PROCEDURE °After the procedure, it is typical to experience: °· Sleepiness. °· Nausea and vomiting. °HOME CARE INSTRUCTIONS °· For the first 24 hours after general anesthesia: °¨ Have a responsible person with you. °¨ Do not drive a car.  If you are alone, do not take public transportation. °¨ Do not drink alcohol. °¨ Do not take medicine that has not been prescribed by your health care provider. °¨ Do not sign important papers or make important decisions. °¨ You may resume a normal diet and activities as directed by your health care provider. °· Change bandages (dressings) as directed. °· If you have questions or problems that seem related to general anesthesia, call the hospital and ask for the anesthetist or anesthesiologist on call. °SEEK MEDICAL CARE IF: °· You have nausea and vomiting that continue the day after anesthesia. °· You develop a rash. °SEEK IMMEDIATE MEDICAL CARE IF:  °· You have difficulty breathing. °· You have chest pain. °· You have any allergic problems. °Document Released: 11/10/2000 Document Revised: 08/09/2013 Document Reviewed: 02/17/2013 °Exit[employer]® Patient Information ©2015 [employer]. This information is not intended to replace advice given to you by your health care provider. Make sure you discuss any questions you have with your health care provider. ° °

## 2014-12-11 NOTE — Transfer of Care (Signed)
Immediate Anesthesia Transfer of Care Note  Patient: Edward BeetsVictor X Carlson  Procedure(s) Performed: Procedure(s): CYSTOSCOPY, CLOT EVACUATION, WITH FULGURATION  (N/A)  Patient Location: PACU  Anesthesia Type:General  Level of Consciousness: awake, sedated and patient cooperative  Airway & Oxygen Therapy: Patient Spontanous Breathing and Patient connected to face mask oxygen  Post-op Assessment: Report given to RN and Post -op Vital signs reviewed and stable  Post vital signs: Reviewed and stable  Last Vitals:  Filed Vitals:   12/11/14 1407  BP: 136/74  Pulse: 77  Temp: 36.6 C  Resp: 20    Complications: No apparent anesthesia complications

## 2014-12-11 NOTE — Discharge Instructions (Signed)
Call Dr. Mena GoesEskridge tomorrow morning at 8 AM to set up an appointment for tomorrow afternoon.  I spoke with the on-call doctor and that was how he wanted to proceed.

## 2014-12-11 NOTE — Anesthesia Postprocedure Evaluation (Signed)
  Anesthesia Post-op Note  Patient: Edward BeetsVictor X Wideman  Procedure(s) Performed: Procedure(s) (LRB): CYSTOSCOPY, CLOT EVACUATION, WITH FULGURATION  (N/A)  Patient Location: PACU  Anesthesia Type: General  Level of Consciousness: awake and alert   Airway and Oxygen Therapy: Patient Spontanous Breathing  Post-op Pain: mild  Post-op Assessment: Post-op Vital signs reviewed, Patient's Cardiovascular Status Stable, Respiratory Function Stable, Patent Airway and No signs of Nausea or vomiting  Last Vitals:  Filed Vitals:   12/11/14 1845  BP: 141/85  Pulse: 84  Temp: 36.8 C  Resp: 20    Post-op Vital Signs: stable   Complications: No apparent anesthesia complications

## 2014-12-11 NOTE — Anesthesia Procedure Notes (Signed)
Procedure Name: LMA Insertion Date/Time: 12/11/2014 5:32 PM Performed by: Paulla DollyJOYCE, Yeilin Zweber A Pre-anesthesia Checklist: Patient identified, Emergency Drugs available, Suction available, Patient being monitored and Timeout performed Patient Re-evaluated:Patient Re-evaluated prior to inductionOxygen Delivery Method: Circle system utilized Preoxygenation: Pre-oxygenation with 100% oxygen Intubation Type: Combination inhalational/ intravenous induction Ventilation: Mask ventilation without difficulty LMA: LMA with gastric port inserted LMA Size: 5.0 Tube type: Oral Number of attempts: 1 Placement Confirmation: positive ETCO2 and breath sounds checked- equal and bilateral Tube secured with: Tape Dental Injury: Teeth and Oropharynx as per pre-operative assessment

## 2014-12-11 NOTE — Brief Op Note (Signed)
12/11/2014  6:03 PM  PATIENT:  Edward [name]  74 y.o. male  PRE-OPERATIVE DIAGNOSIS:  clot retention  POST-OPERATIVE DIAGNOSIS:  Prostatic bleeding  PROCEDURE:  Procedure(s): CYSTOSCOPY, CLOT EVACUATION, WITH FULGURATION  (N/A)  SURGEON:  Surgeon(s) and Role:    * Bjorn PippinJohn Atleigh Gruen, MD - Primary  PHYSICIAN ASSISTANT:   ASSISTANTS: none   ANESTHESIA:   general  EBL:  Total I/O In: -  Out: 150 [Urine:150]  BLOOD ADMINISTERED:none  DRAINS: none   LOCAL MEDICATIONS USED:  NONE  SPECIMEN:  No Specimen  DISPOSITION OF SPECIMEN:  N/A  COUNTS:  YES  TOURNIQUET:  * No tourniquets in log *  DICTATION: .Other Dictation: Dictation Number [id number]  PLAN OF CARE: Discharge to home after PACU  PATIENT DISPOSITION:  PACU - hemodynamically stable.   Delay start of Pharmacological VTE agent (>24hrs) due to surgical blood loss or risk of bleeding: yes

## 2014-12-12 ENCOUNTER — Encounter (HOSPITAL_COMMUNITY): Payer: Self-pay | Admitting: Urology

## 2014-12-12 NOTE — Op Note (Signed)
NAME:  Edward BlackwaterKINNEY, Lytle               ACCOUNT NO.:  [id number]  MEDICAL RECORD NO.:  [id number]  LOCATION:  [facility]                         [facility]  PHYSICIAN:  Excell SeltzerJohn J. Annabell HowellsWrenn, M.D.    DATE OF BIRTH:  May 13, 1941  DATE OF PROCEDURE:  12/11/2014 DATE OF DISCHARGE:  12/11/2014                              OPERATIVE REPORT   PROCEDURE: 1. Cystoscopy with evacuation of clots. 2. Fulguration of prostatic urethral bleeders.  PREOPERATIVE DIAGNOSIS:  Clot retention.  POSTOPERATIVE DIAGNOSIS:  Clot retention with prostatic urethral bleeding.  SURGEON:  Excell SeltzerJohn J. Annabell HowellsWrenn, M.D.  ANESTHESIA:  General.  DRAINS:  None.  SPECIMEN:  None.  BLOOD LOSS:  Minimal.  COMPLICATIONS:  None.  INDICATIONS:  Edward [name] is a 74 year old white male patient of Dr. Mena GoesEskridge with a prior history of TURP using GreenLight laser prostatectomy who has had gross hematuria for the last 9 days.  He has had a Foley catheter and made multiple trips to the emergency room.  He presented to the office this morning with an obstructing catheter and it was felt that a trip to the OR, was cystoscopy, clot evacuation and fulguration was indicated.  FINDINGS AND PROCEDURE:  He was given Cipro.  He was taken to the operating room where general anesthetic was induced.  He was placed in lithotomy position.  His perineum and genitalia were prepped with Betadine solution.  He was draped in usual sterile fashion.  Of note, he had PAS hose placed prior to draping.  Cystoscopy was performed using a 22-French scope and 30 degree lens.  Examination revealed a normal urethra.  The external sphincter was intact.  The prostatic urethra was approximately 3-4 cm in length with bilobar hyperplasia, but patent TUR defect.  There were some bleeding at the apex anteriorly and on the right lateral wall, but the primary bleeder appeared to be at the bladder neck at 6 o'clock.  Inspection of the bladder revealed some areas of catheter  trauma, but no tumors or stones were noted.  He had moderate trabeculation.  His ureteral orifices were unremarkable.  After initial cystoscopy, the urethra was calibrated to 30-French and a 28-French continuous flow resectoscope sheath was inserted.  This was fitted with an Wandra ScotIglesias handle with a 30-degree lens and a bipolar loop saline was used as the irrigant.  The bleeder at the bladder neck was fulgurated initially.  Once that had been controlled, additional areas of neovascularity from his laser procedure were fulgurated on the right and left lateral lobes.  The anterior area with several small bleeding vessels was fulgurated as well as a more active bleeder at the right apex.  Once the fulguration was complete, there was no bleeding from the prostatic urethra, the bladder.  Another small clot was removed.  It had been retained from the initial cystoscopy.  Several clots had been removed when I first placed the scope.  At this point, the scope was removed.  Pressure on the bladder produced an excellent stream with initial clear urine, but once the stream had declined.  There was some bloody drainage.  I did insert the scope and he appeared to have some oozing in the distal  urethra from the necessary calibration to place the scope.  It was not felt that the catheter was indicated at this point.  The patient was then taken down from lithotomy position.  His anesthetic was reversed.  He was moved to the recovery room in stable condition. There were no complications.  He will be discharged home when voiding.     Excell Seltzer. Annabell Howells, M.D.     JJW/MEDQ  D:  12/11/2014  T:  12/12/2014  Job:  [id number]

## 2015-02-13 DIAGNOSIS — D692 Other nonthrombocytopenic purpura: Secondary | ICD-10-CM | POA: Insufficient documentation

## 2015-09-13 ENCOUNTER — Other Ambulatory Visit: Payer: Self-pay | Admitting: Urology

## 2015-09-13 NOTE — Progress Notes (Signed)
Please put orders in [facility] as patient has Pre-op appointment tomorrow Friday 09/14/2015 at 1130 am! Thank you!

## 2015-09-14 ENCOUNTER — Encounter (HOSPITAL_COMMUNITY): Payer: Self-pay

## 2015-09-14 ENCOUNTER — Encounter (HOSPITAL_COMMUNITY)
Admission: RE | Admit: 2015-09-14 | Discharge: 2015-09-14 | Disposition: A | Discharge status: Home / Self Care | Payer: Medicare Other | Source: Ambulatory Visit | Attending: Urology | Admitting: Urology

## 2015-09-14 DIAGNOSIS — Z01812 Encounter for preprocedural laboratory examination: Secondary | ICD-10-CM | POA: Insufficient documentation

## 2015-09-14 LAB — BASIC METABOLIC PANEL
ANION GAP: 8 (ref 5–15)
BUN: 17 mg/dL (ref 6–20)
CHLORIDE: 105 mmol/L (ref 101–111)
CO2: 24 mmol/L (ref 22–32)
Calcium: 9.7 mg/dL (ref 8.9–10.3)
Creatinine, Ser: 1.28 mg/dL — ABNORMAL HIGH (ref 0.61–1.24)
GFR calc Af Amer: >60 mL/min (ref >60)
GFR, EST NON AFRICAN AMERICAN: 54 mL/min — AB (ref >60)
Glucose, Bld: 100 mg/dL — ABNORMAL HIGH (ref 65–99)
POTASSIUM: 5 mmol/L (ref 3.5–5.1)
SODIUM: 137 mmol/L (ref 135–145)

## 2015-09-14 NOTE — Patient Instructions (Addendum)
Edward [name]  09/14/2015   Your procedure is scheduled on: Tuesday 09-18-15  Report to [facility] Main  Entrance take [facility]  elevators to 3rd floor to  Short Stay Center at 5:50AM.  Call this number if you have problems the morning of surgery [phone]   Remember: ONLY 1 PERSON MAY GO WITH YOU TO SHORT STAY TO GET  READY MORNING OF YOUR SURGERY.  Do not eat food or drink liquids :After Midnight.     Take these medicines the morning of surgery with A SIP OF WATER: Amlodipine (Norvasc), Atenolol                               You may not have any metal on your body including hair pins and              piercings  Do not wear jewelry, make-up, lotions, powders or perfumes, deodorant             Do not wear nail polish.  Do not shave  48 hours prior to surgery.              Men may shave face and neck.   Do not bring valuables to the hospital.  IS NOT             RESPONSIBLE   FOR VALUABLES.  Contacts, dentures or bridgework may not be worn into surgery.  Leave suitcase in the car. After surgery it may be brought to your room.    Special Instructions: N/A              Please read over the following fact sheets you were given: _____________________________________________________________________             [facility] - Preparing for Surgery Before surgery, you can play an important role.  Because skin is not sterile, your skin needs to be as free of germs as possible.  You can reduce the number of germs on your skin by washing with CHG (chlorahexidine gluconate) soap before surgery.  CHG is an antiseptic cleaner which kills germs and bonds with the skin to continue killing germs even after washing. Please DO NOT use if you have an allergy to CHG or antibacterial soaps.  If your skin becomes reddened/irritated stop using the CHG and inform your nurse when you arrive at Short Stay. Do not shave (including legs and underarms) for at least 48 hours  prior to the first CHG shower.  You may shave your face/neck. Please follow these instructions carefully:  1.  Shower with CHG Soap the night before surgery and the  morning of Surgery.  2.  If you choose to wash your hair, wash your hair first as usual with your  normal  shampoo.  3.  After you shampoo, rinse your hair and body thoroughly to remove the  shampoo.                           4.  Use CHG as you would any other liquid soap.  You can apply chg directly  to the skin and wash                       Gently with a scrungie or clean washcloth.  5.  Apply  the CHG Soap to your body ONLY FROM THE NECK DOWN.   Do not use on face/ open                           Wound or open sores. Avoid contact with eyes, ears mouth and genitals (private parts).                       Wash face,  Genitals (private parts) with your normal soap.             6.  Wash thoroughly, paying special attention to the area where your surgery  will be performed.  7.  Thoroughly rinse your body with warm water from the neck down.  8.  DO NOT shower/wash with your normal soap after using and rinsing off  the CHG Soap.                9.  Pat yourself dry with a clean towel.            10.  Wear clean pajamas.            11.  Place clean sheets on your bed the night of your first shower and do not  sleep with pets. Day of Surgery : Do not apply any lotions/deodorants the morning of surgery.  Please wear clean clothes to the hospital/surgery center.  FAILURE TO FOLLOW THESE INSTRUCTIONS MAY RESULT IN THE CANCELLATION OF YOUR SURGERY PATIENT SIGNATURE_________________________________  NURSE SIGNATURE__________________________________  ________________________________________________________________________   Edward [name]  An incentive spirometer is a tool that can help keep your lungs clear and active. This tool measures how well you are filling your lungs with each breath. Taking long deep breaths may help reverse  or decrease the chance of developing breathing (pulmonary) problems (especially infection) following:  A long period of time when you are unable to move or be active. BEFORE THE PROCEDURE   If the spirometer includes an indicator to show your best effort, your nurse or respiratory therapist will set it to a desired goal.  If possible, sit up straight or lean slightly forward. Try not to slouch.  Hold the incentive spirometer in an upright position. INSTRUCTIONS FOR USE  1. Sit on the edge of your bed if possible, or sit up as far as you can in bed or on a chair. 2. Hold the incentive spirometer in an upright position. 3. Breathe out normally. 4. Place the mouthpiece in your mouth and seal your lips tightly around it. 5. Breathe in slowly and as deeply as possible, raising the piston or the ball toward the top of the column. 6. Hold your breath for 3-5 seconds or for as long as possible. Allow the piston or ball to fall to the bottom of the column. 7. Remove the mouthpiece from your mouth and breathe out normally. 8. Rest for a few seconds and repeat Steps 1 through 7 at least 10 times every 1-2 hours when you are awake. Take your time and take a few normal breaths between deep breaths. 9. The spirometer may include an indicator to show your best effort. Use the indicator as a goal to work toward during each repetition. 10. After each set of 10 deep breaths, practice coughing to be sure your lungs are clear. If you have an incision (the cut made at the time of surgery), support your incision when coughing by placing a pillow or rolled  up towels firmly against it. Once you are able to get out of bed, walk around indoors and cough well. You may stop using the incentive spirometer when instructed by your caregiver.  RISKS AND COMPLICATIONS  Take your time so you do not get dizzy or light-headed.  If you are in pain, you may need to take or ask for pain medication before doing incentive  spirometry. It is harder to take a deep breath if you are having pain. AFTER USE  Rest and breathe slowly and easily.  It can be helpful to keep track of a log of your progress. Your caregiver can provide you with a simple table to help with this. If you are using the spirometer at home, follow these instructions: [place] IF:   You are having difficultly using the spirometer.  You have trouble using the spirometer as often as instructed.  Your pain medication is not giving enough relief while using the spirometer.  You develop fever of 100.5 F (38.1 C) or higher. SEEK IMMEDIATE MEDICAL CARE IF:   You cough up bloody sputum that had not been present before.  You develop fever of 102 F (38.9 C) or greater.  You develop worsening pain at or near the incision site. MAKE SURE YOU:   Understand these instructions.  Will watch your condition.  Will get help right away if you are not doing well or get worse. Document Released: 12/15/2006 Document Revised: 10/27/2011 Document Reviewed: 02/15/2007 [facility] Patient Information 2014 [place], [place].   ________________________________________________________________________

## 2015-09-14 NOTE — Pre-Procedure Instructions (Signed)
EKG 12-11-14 epic LOV Cardiology 01-12-15 on chart CBC w diff 09-07-15 on chart

## 2015-09-17 NOTE — H&P (Signed)
History of Present Illness               F/u - PCP Dr. 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 Cystoscopy revealed a distal urethral stricture which was dilated. There was a good greenlight defect in the bladder was normal. He was taught CIC. Repeat pvr 52 ml.   -Jan 2016 - dilated to 16Fr for cystoscopy     2- BPH/Elevated PSA - His PSA runs in the 8-12 range. He had biopsies done in 2005, 2009 and 2010 --> 85 g then 59 gram prostate  -Oct 2010 PSA 11.9   -Sept 2012 bun 14, cr 0.98.   -Oct 2012 normal DRE  -Apr 2013 PSA 8.17. His AUA SS = 11 mixed QOL.   -October 2013 PSA 14 with pyuria on U/A. Started on Cipro. DRE was normal.   -May 2014 showed PSA 10.95, UA tntc red cells, BUN 15, creatinine 1.1, normal LFTs and alk phos   -Jun 2014 normal DRE  -Jul 2014 cystoscopy - large median lobe on cysto in OR with BOO  On surveillance  -Feb 2015 - s/p Greenlight PVP and left spermatocelectomy ; Nl DRE.   -July 2015 - PSA 6.6   -Feb 2016 - off all meds. Doing well. Nl DRE.   -Aug 2016 PSA 8.27  -Nov 2016 normal DRE    3- Nephrolithiasis -   -June 2011 developed microhematuria.   A CT revealed bilateral stones. A cystoscopy July 2011 was negative apart from a small median lobe and significant lateral lobes.   -Mar 2012 KUB showed bilateral renal stones and lots of phleboliths.  -Nov 2013 Renal ultrasound right kidney is normal in appearance with several stones ranging from 11 mm to 19 mm. There is no hydronephrosis or mass. Left kidney appears normal without mass or hydronephrosis. There is a 7 mm stone. The bladder appears normal with no ureters or prostate seen. The postvoid residual is 3.35 mL.  KUB right renal stone, left renal stone. Several stable pelvic phleboliths. Normal bowel gas and bones.  -Jan 2014 CT A/P - stone from left kidney about 6 mm now at left UVJ, minimal hydro, comparison CT A/P 2011  when this stone was in the left kidney and there were no stones at left UVJ.   -Jun 2014 KUB  - stone at Left UVJ still present and moved down compared to CT and KUB Nov 2013. normal bones and bowel gas pattern.   -Jul 2014 left URS, stone extraction, stent. Pt removed stent.    -Aug 2016 - KUB - normal. Maybe some small stones in the kidneys. Stable phleboliths.    4-MH, gross hematuria -  -June 2011 [facility] - CT above; cystoscopy normal   -June 2014 gross hematuria - CT Jan 2014 - stones, cysto Jul 2014 (in [place] for stones)   -Apr 2016 - cystoscopy and clot evacuation. Some neovascularity especially at the bladder neck which was fulgurated. The bladder appeared normal. There were no tumors or stones. CT hematuria protocol which was benign. It showed some small bilateral renal stones and some clot in the bladder.   -Jan 2017 - cystoscopy - normal, necrotic tissue and prostatic urethra. Cytology negative. Renal ultrasound which was normal and revealed a postvoid of 24 mL.    5-spermatocelectomy; left - Feb 2015.           Jan 2017 int hx   Patient returns for continued management of  gross hematuria. Since he was seen last his hematuria cleared. He wanted to talk about the TURP residual planned.         Past Medical History Problems  1. History of hypercholesterolemia (Z86.39) 2. History of hypertension (Z86.79) 3. History of kidney stones (Z87.442) 4. History of Post-angioplasty  Surgical History Problems  1. History of Cystoscopy With Fulguration 2. History of Cystoscopy With Insertion Of Ureteral Stent Left 3. History of Cystoscopy With Ureteroscopy With Removal Of Calculus 4. History of Cystourethroscopy With Irrigation And Evacuation Of Clots 5. History of Foot Surgery 6. History of Inguinal Hernia Repair 7. History of Laser Vaporization 8. History of Laser Vaporization With Transurethral Resection Of Prostate 9. History of Shoulder Surgery 10. History of  Shoulder Surgery Left 11. History of Surgery Excision Of Spermatocele With Epididymectomy Left 12. History of Umbilical Hernia Repair  Current Meds 1. AmLODIPine Besylate 10 MG Oral Tablet;  Therapy: [id number] to Recorded 2. Atenolol 50 MG Oral Tablet;  Therapy: (Recorded:29Aug2008) to Recorded 3. Crestor 10 MG Oral Tablet;  Therapy: 28Jun2010 to Recorded 4. Finasteride 5 MG Oral Tablet; TAKE ONE TABLET BY MOUTH DAILY;  Therapy: [id number] to (Evaluate:04Feb2017)  Requested for: 01EOF1219; Last  Rx:05Jan2017 Ordered 5. Losartan Potassium 50 MG Oral Tablet;  Therapy: 75OIT2549 to Recorded 6. Sulfamethoxazole-Trimethoprim 400-80 MG Oral Tablet; 1 tablet BID x 10 days, then 1  tablet QD x 10 days, then 1/2 tablet daily x 10 days;  Therapy: 20Jan2017 to (Last Rx:20Jan2017)  Requested for: 20Jan2017 Ordered  Allergies Medication  1. Cipro TABS  Family History Problems  1. Family history of Death In The Family Father : Father   MVA age 32 2. Family history of Research officer, trade union Accident : Father  Social History Problems  1. Alcohol Use (History)   Two a week 2. Caffeine Use   3 cups per day 3. Former smoker [phone]) 4. Marital History - Currently Married 5. Occupation:   Retired 61. History of Tobacco Use  Vitals Vital Signs [Data Includes: Last 1 Day]  Recorded: 25Jan2017 02:31PM  Blood Pressure: 132 / 83 Temperature: 97.7 F Heart Rate: 58  Results/Data Urine [Data Includes: Last 1 Day]   [id number]  COLOR YELLOW   APPEARANCE CLEAR   SPECIFIC GRAVITY 1.025   pH 6.0   GLUCOSE NEGATIVE   BILIRUBIN NEGATIVE   KETONE NEGATIVE   BLOOD TRACE   PROTEIN NEGATIVE   NITRITE NEGATIVE   LEUKOCYTE ESTERASE NEGATIVE   SQUAMOUS EPITHELIAL/HPF 0-5 HPF  WBC 0-5 WBC/HPF  RBC 0-2 RBC/HPF  BACTERIA NONE SEEN HPF  CRYSTALS NONE SEEN HPF  CASTS NONE SEEN LPF  Yeast NONE SEEN HPF   Assessment Assessed  1. Benign localized prostatic hyperplasia without lower urinary  tract symptoms (LUTS)  (N40.0) 2. Gross hematuria (R31.0) 3. 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 Follow-up Schedule Surgery Office  Follow-up  Status: Complete  Done: [id number] Gross hematuria, ,   2. Start: Nitrofurantoin Monohyd Macro 100 MG Oral Capsule; TAKE ONE CAPSULE BY  MOUTH AT BEDTIME Health Maintenance  3. UA With REFLEX; [Do Not Release]; Status:Resulted - Requires Verification;   Done:  [id number] 01:54PM  Discussion/Summary    Gross hematuria , recurrent UTI - as I discussed with him last time I do believe he has some necrotic tissue in the prostatic urethra either from the laser or the fulguration. I think he do well with his TURP residual and  we discussed the nature risk and benefits such as urethral stricture, prostatic urethral stricture, incontinence and further bleeding. It would also give us a chance to do a more thorough cystoscopy in the OR. Also on exam under anesthesia. All questions answered and he elects to proceed. Currently is on Bactrim and I'll have him continue nightly nitrofurantoin.    Signatures Electronically signed by : Matthew Eskridge, M.D.; Sep 12 2015  3:23PM EST   

## 2015-09-18 ENCOUNTER — Ambulatory Visit (HOSPITAL_COMMUNITY)
Admission: RE | Admit: 2015-09-18 | Discharge: 2015-09-18 | Disposition: A | Discharge status: Home / Self Care | Payer: Medicare Other | Source: Ambulatory Visit | Attending: Urology | Admitting: Urology

## 2015-09-18 ENCOUNTER — Encounter (HOSPITAL_COMMUNITY)
Admission: RE | Disposition: A | Discharge status: Home / Self Care | Payer: Self-pay | Source: Ambulatory Visit | Attending: Urology

## 2015-09-18 ENCOUNTER — Ambulatory Visit (HOSPITAL_COMMUNITY): Payer: Medicare Other | Admitting: Certified Registered"

## 2015-09-18 ENCOUNTER — Encounter (HOSPITAL_COMMUNITY): Payer: Self-pay | Admitting: *Deleted

## 2015-09-18 DIAGNOSIS — Z87440 Personal history of urinary (tract) infections: Secondary | ICD-10-CM | POA: Diagnosis not present

## 2015-09-18 DIAGNOSIS — N400 Enlarged prostate without lower urinary tract symptoms: Secondary | ICD-10-CM | POA: Diagnosis not present

## 2015-09-18 DIAGNOSIS — I10 Essential (primary) hypertension: Secondary | ICD-10-CM | POA: Insufficient documentation

## 2015-09-18 DIAGNOSIS — Z87442 Personal history of urinary calculi: Secondary | ICD-10-CM | POA: Diagnosis not present

## 2015-09-18 DIAGNOSIS — E7800 Pure hypercholesterolemia, unspecified: Secondary | ICD-10-CM | POA: Diagnosis not present

## 2015-09-18 DIAGNOSIS — I2510 Atherosclerotic heart disease of native coronary artery without angina pectoris: Secondary | ICD-10-CM | POA: Insufficient documentation

## 2015-09-18 DIAGNOSIS — N359 Urethral stricture, unspecified: Secondary | ICD-10-CM | POA: Insufficient documentation

## 2015-09-18 DIAGNOSIS — M1990 Unspecified osteoarthritis, unspecified site: Secondary | ICD-10-CM | POA: Diagnosis not present

## 2015-09-18 DIAGNOSIS — R972 Elevated prostate specific antigen [PSA]: Secondary | ICD-10-CM | POA: Diagnosis not present

## 2015-09-18 DIAGNOSIS — Z87891 Personal history of nicotine dependence: Secondary | ICD-10-CM | POA: Insufficient documentation

## 2015-09-18 DIAGNOSIS — N401 Enlarged prostate with lower urinary tract symptoms: Secondary | ICD-10-CM

## 2015-09-18 DIAGNOSIS — N138 Other obstructive and reflux uropathy: Secondary | ICD-10-CM

## 2015-09-18 DIAGNOSIS — R310 Gross hematuria: Secondary | ICD-10-CM | POA: Diagnosis present

## 2015-09-18 HISTORY — PX: CYSTOSCOPY: SHX5120

## 2015-09-18 HISTORY — PX: TRANSURETHRAL RESECTION OF PROSTATE: SHX73

## 2015-09-18 SURGERY — CYSTOSCOPY
Anesthesia: General

## 2015-09-18 MED ORDER — SODIUM CHLORIDE 0.9 % IR SOLN
Status: DC | PRN
  Administered 2015-09-18: 18000 mL

## 2015-09-18 MED ORDER — ONDANSETRON HCL 4 MG/2ML IJ SOLN: 4.0000 mg | Freq: Four times a day (QID) | INTRAMUSCULAR | Status: DC | PRN

## 2015-09-18 MED ORDER — FENTANYL CITRATE (PF) 100 MCG/2ML IJ SOLN
INTRAMUSCULAR | Status: DC | PRN
  Administered 2015-09-18 (×2): 50 ug via INTRAVENOUS

## 2015-09-18 MED ORDER — SODIUM CHLORIDE 0.9 % IV SOLN: INTRAVENOUS | Status: DC

## 2015-09-18 MED ORDER — EPHEDRINE SULFATE 50 MG/ML IJ SOLN
INTRAMUSCULAR | Status: AC
  Filled 2015-09-18: qty 1

## 2015-09-18 MED ORDER — MIDAZOLAM HCL 5 MG/5ML IJ SOLN
INTRAMUSCULAR | Status: DC | PRN
  Administered 2015-09-18 (×2): 1 mg via INTRAVENOUS

## 2015-09-18 MED ORDER — PROPOFOL 10 MG/ML IV BOLUS
INTRAVENOUS | Status: AC
  Filled 2015-09-18: qty 20

## 2015-09-18 MED ORDER — MIDAZOLAM HCL 2 MG/2ML IJ SOLN
INTRAMUSCULAR | Status: AC
  Filled 2015-09-18: qty 2

## 2015-09-18 MED ORDER — LIDOCAINE HCL (CARDIAC) 20 MG/ML IV SOLN
INTRAVENOUS | Status: AC
  Filled 2015-09-18: qty 5

## 2015-09-18 MED ORDER — LIDOCAINE HCL (CARDIAC) 20 MG/ML IV SOLN
INTRAVENOUS | Status: DC | PRN
  Administered 2015-09-18: 50 mg via INTRAVENOUS

## 2015-09-18 MED ORDER — TRAMADOL HCL 50 MG PO TABS: 50.0000 mg | ORAL_TABLET | Freq: Four times a day (QID) | ORAL | Status: DC | PRN

## 2015-09-18 MED ORDER — OXYCODONE HCL 5 MG PO TABS: 5.0000 mg | ORAL_TABLET | Freq: Once | ORAL | Status: DC | PRN

## 2015-09-18 MED ORDER — HYDROMORPHONE HCL 1 MG/ML IJ SOLN
0.2500 mg | INTRAMUSCULAR | Status: DC | PRN
  Administered 2015-09-18: 0.25 mg via INTRAVENOUS
  Administered 2015-09-18: 0.5 mg via INTRAVENOUS
  Administered 2015-09-18: 0.25 mg via INTRAVENOUS

## 2015-09-18 MED ORDER — NITROFURANTOIN MONOHYD MACRO 100 MG PO CAPS: 100.0000 mg | ORAL_CAPSULE | Freq: Every day | ORAL | Status: DC

## 2015-09-18 MED ORDER — PHENYLEPHRINE 40 MCG/ML (10ML) SYRINGE FOR IV PUSH (FOR BLOOD PRESSURE SUPPORT)
PREFILLED_SYRINGE | INTRAVENOUS | Status: AC
  Filled 2015-09-18: qty 10

## 2015-09-18 MED ORDER — ONDANSETRON HCL 4 MG/2ML IJ SOLN
INTRAMUSCULAR | Status: DC | PRN
  Administered 2015-09-18: 4 mg via INTRAVENOUS

## 2015-09-18 MED ORDER — ONDANSETRON HCL 4 MG/2ML IJ SOLN
INTRAMUSCULAR | Status: AC
  Filled 2015-09-18: qty 2

## 2015-09-18 MED ORDER — LIDOCAINE HCL 2 % EX GEL
CUTANEOUS | Status: AC
  Filled 2015-09-18: qty 5

## 2015-09-18 MED ORDER — DEXAMETHASONE SODIUM PHOSPHATE 10 MG/ML IJ SOLN
INTRAMUSCULAR | Status: AC
  Filled 2015-09-18: qty 1

## 2015-09-18 MED ORDER — CEFAZOLIN SODIUM-DEXTROSE 2-3 GM-% IV SOLR
2.0000 g | INTRAVENOUS | Status: AC
  Administered 2015-09-18: 2 g via INTRAVENOUS

## 2015-09-18 MED ORDER — PHENYLEPHRINE HCL 10 MG/ML IJ SOLN
INTRAMUSCULAR | Status: DC | PRN
  Administered 2015-09-18 (×5): 40 ug via INTRAVENOUS

## 2015-09-18 MED ORDER — OXYCODONE HCL 5 MG/5ML PO SOLN
5.0000 mg | Freq: Once | ORAL | Status: DC | PRN
  Filled 2015-09-18: qty 5

## 2015-09-18 MED ORDER — FENTANYL CITRATE (PF) 100 MCG/2ML IJ SOLN
INTRAMUSCULAR | Status: AC
  Filled 2015-09-18: qty 2

## 2015-09-18 MED ORDER — DEXAMETHASONE SODIUM PHOSPHATE 10 MG/ML IJ SOLN
INTRAMUSCULAR | Status: DC | PRN
  Administered 2015-09-18: 10 mg via INTRAVENOUS

## 2015-09-18 MED ORDER — HYDROMORPHONE HCL 1 MG/ML IJ SOLN
INTRAMUSCULAR | Status: AC
  Filled 2015-09-18: qty 1

## 2015-09-18 MED ORDER — CEFAZOLIN SODIUM-DEXTROSE 2-3 GM-% IV SOLR
INTRAVENOUS | Status: AC
  Filled 2015-09-18: qty 50

## 2015-09-18 MED ORDER — LIDOCAINE HCL 2 % EX GEL
CUTANEOUS | Status: DC | PRN
  Administered 2015-09-18: 1 via URETHRAL

## 2015-09-18 MED ORDER — PROPOFOL 10 MG/ML IV BOLUS
INTRAVENOUS | Status: DC | PRN
  Administered 2015-09-18: 160 mg via INTRAVENOUS

## 2015-09-18 MED ORDER — SODIUM CHLORIDE 0.9 % IV SOLN
INTRAVENOUS | Status: DC | PRN
  Administered 2015-09-18 (×2): via INTRAVENOUS

## 2015-09-18 SURGICAL SUPPLY — 18 items
BAG URINE DRAINAGE (UROLOGICAL SUPPLIES) ×3 IMPLANT
BAG URO CATCHER STRL LF (MISCELLANEOUS) ×3 IMPLANT
CATH FOLEY 2WAY 5CC 20FR (CATHETERS) IMPLANT
CLOTH BEACON ORANGE TIMEOUT ST (SAFETY) ×3 IMPLANT
ELECT REM PT RETURN 9FT ADLT (ELECTROSURGICAL) ×3
ELECTRODE REM PT RTRN 9FT ADLT (ELECTROSURGICAL) ×1 IMPLANT
EVACUATOR MICROVAS BLADDER (UROLOGICAL SUPPLIES) ×3 IMPLANT
GLOVE BIOGEL M STRL SZ7.5 (GLOVE) ×3 IMPLANT
GOWN STRL REUS W/TWL LRG LVL3 (GOWN DISPOSABLE) ×3 IMPLANT
GOWN STRL REUS W/TWL XL LVL3 (GOWN DISPOSABLE) ×3 IMPLANT
HOLDER FOLEY CATH W/STRAP (MISCELLANEOUS) IMPLANT
LOOP CUT BIPOLAR 24F LRG (ELECTROSURGICAL) ×2 IMPLANT
MANIFOLD NEPTUNE II (INSTRUMENTS) ×3 IMPLANT
PACK CYSTO (CUSTOM PROCEDURE TRAY) ×3 IMPLANT
SYR 30ML LL (SYRINGE) IMPLANT
SYRINGE IRR TOOMEY STRL 70CC (SYRINGE) ×3 IMPLANT
TUBING CONNECTING 10 (TUBING) ×2 IMPLANT
TUBING CONNECTING 10' (TUBING) ×1

## 2015-09-18 NOTE — Discharge Instructions (Signed)
Foley Catheter Care, Adult °A Foley catheter is a soft, flexible tube. This tube is placed into your bladder to drain pee (urine). If you go home with this catheter in place, follow the instructions below. °TAKING CARE OF THE CATHETER °· Wash your hands with soap and water. °· Put soap and water on a clean washcloth. °· Clean the skin where the tube goes into your body. °· Clean away from the tube site. °· Never wipe toward the tube. °· Clean the area using a circular motion. °· Remove all the soap. Pat the area dry with a clean towel. For males, reposition the skin that covers the end of the penis (foreskin). °· Attach the tube to your leg with tape or a leg strap. Do not stretch the tube tight. If you are using tape, remove any stickiness left behind by past tape you used. °· Keep the drainage bag below your hips. Keep it off the floor. °· Check your tube during the day. Make sure it is working and draining. Make sure the tube does not curl, twist, or bend. °· Do not pull on the tube or try to take it out. °TAKING CARE OF THE DRAINAGE BAGS °You will have a large overnight drainage bag and a small leg bag. You may wear the overnight bag any time. Never wear the small bag at night. Follow the directions below. °Emptying the Drainage Bag °Empty your drainage bag when it is  -½ full or at least 2-3 times a day. °· Wash your hands with soap and water. °· Keep the drainage bag below your hips. °· Hold the dirty bag over the toilet or clean container. °· Open the pour spout at the bottom of the bag. Empty the pee into the toilet or container. Do not let the pour spout touch anything. °· Clean the pour spout with a gauze pad or cotton ball that has rubbing alcohol on it. °· Close the pour spout. °· Attach the bag to your leg with tape or a leg strap. °· Wash your hands well. °Changing the Drainage Bag °Change your bag once a month or sooner if it starts to smell or look dirty.  °· Wash your hands with soap and  water. °· Pinch the rubber tube so that pee does not spill out. °· Disconnect the catheter tube from the drainage tube at the connection valve. Do not let the tubes touch anything. °· Clean the end of the catheter tube with an alcohol wipe. Clean the end of a the drainage tube with a different alcohol wipe. °· Connect the catheter tube to the drainage tube of the clean drainage bag. °· Attach the new bag to the leg with tape or a leg strap. Avoid attaching the new bag too tightly. °· Wash your hands well. °Cleaning the Drainage Bag °· Wash your hands with soap and water. °· Wash the bag in warm, soapy water. °· Rinse the bag with warm water. °· Fill the bag with a mixture of white vinegar and water (1 cup vinegar to 1 quart warm water [.2 liter vinegar to 1 liter warm water]). Close the bag and soak it for 30 minutes in the solution. °· Rinse the bag with warm water. °· Hang the bag to dry with the pour spout open and hanging downward. °· Store the clean bag (once it is dry) in a clean plastic bag. °· Wash your hands well. °PREVENT INFECTION °· Wash your hands before and after touching your tube. °·   Take showers every day. Wash the skin where the tube enters your body. Do not take baths. Replace wet leg straps with dry ones, if this applies. °· Do not use powders, sprays, or lotions on the genital area. Only use creams, lotions, or ointments as told by your doctor. °· For females, wipe from front to back after going to the bathroom. °· Drink enough fluids to keep your pee clear or pale yellow unless you are told not to have too much fluid (fluid restriction). °· Do not let the drainage bag or tubing touch or lie on the floor. °· Wear cotton underwear to keep the area dry. °GET HELP IF: °· Your pee is cloudy or smells unusually bad. °· Your tube becomes clogged. °· You are not draining pee into the bag or your bladder feels full. °· Your tube starts to leak. °GET HELP RIGHT AWAY IF: °· You have pain, puffiness  (swelling), redness, or yellowish-white fluid (pus) where the tube enters the body. °· You have pain in the belly (abdomen), legs, lower back, or bladder. °· You have a fever. °· You see blood fill the tube, or your pee is pink or red. °· You feel sick to your stomach (nauseous), throw up (vomit), or have chills. °· Your tube gets pulled out. °MAKE SURE YOU:  °· Understand these instructions. °· Will watch your condition. °· Will get help right away if you are not doing well or get worse. °  °This information is not intended to replace advice given to you by your health care provider. Make sure you discuss any questions you have with your health care provider. °  °Document Released: 11/29/2012 Document Revised: 08/25/2014 Document Reviewed: 11/29/2012 °Elsevier Interactive Patient Education ©2016 [employer] °General Anesthesia, Adult, Care After °Refer to this sheet in the next few weeks. These instructions provide you with information on caring for yourself after your procedure. Your health care provider may also give you more specific instructions. Your treatment has been planned according to current medical practices, but problems sometimes occur. Call your health care provider if you have any problems or questions after your procedure. °WHAT TO EXPECT AFTER THE PROCEDURE °After the procedure, it is typical to experience: °· Sleepiness. °· Nausea and vomiting. °HOME CARE INSTRUCTIONS °· For the first 24 hours after general anesthesia: °¨ Have a responsible person with you. °¨ Do not drive a car. If you are alone, do not take public transportation. °¨ Do not drink alcohol. °¨ Do not take medicine that has not been prescribed by your health care provider. °¨ Do not sign important papers or make important decisions. °¨ You may resume a normal diet and activities as directed by your health care provider. °· Change bandages (dressings) as directed. °· If you have questions or problems that seem related to general  anesthesia, call the hospital and ask for the anesthetist or anesthesiologist on call. °SEEK MEDICAL CARE IF: °· You have nausea and vomiting that continue the day after anesthesia. °· You develop a rash. °SEEK IMMEDIATE MEDICAL CARE IF:  °· You have difficulty breathing. °· You have chest pain. °· You have any allergic problems. °  °This information is not intended to replace advice given to you by your health care provider. Make sure you discuss any questions you have with your health care provider. °  °Document Released: 11/10/2000 Document Revised: 08/25/2014 Document Reviewed: 12/03/2011 °Elsevier Interactive Patient Education ©2016 [employer] Inc. ° °

## 2015-09-18 NOTE — Op Note (Signed)
Preoperative diagnosis: Gross hematuria, BPH, urethral stricture, elevated PSA Postoperative diagnosis: Same  Procedure: Exam under anesthesia, dilation of urethral stricture, Transurethral resection of prostate residual   surgeon: Viviana Trimble  Anesthesia: Gen.   indication for procedure: 75 year old with a history of BPH status post greenlight photo vaporization of the prostate. He had also developed a distal urethral stricture. He has had recurrent gross hematuria there was some necrotic tissue and clot in the prostatic urethra. Also some residual tissue. He was brought today for TURP residual.  Findings: On exam under anesthesia the penis was circumcised without mass or lesion. The testicles were descended bilaterally and palpably normal. The epididymis were palpably normal. On digital rectal exam the prostate was mildly enlarged but smooth without hard areas or nodules. All landmarks were preserved.  On cystoscopy there was a dense penile urethral stricture of about 2 cm. This started proximal to the fossa navicularis. The remainder of the urethra appeared normal. In the prostatic urethra there were some residual median and lateral lobe tissue within the prostatic urethra with some dystrophic calcification on the surface and a small clot in the bladder. Otherwise the bladder neck was identified and preserved. The bladder mucosa appeared normal. There was no stone or foreign body in the bladder. The ureteral orifices were identified and the trigone which was all noted to be normal. There was good clear efflux.  Description of procedure: After consent was obtained patient brought to the operating room. After adequate anesthesia he was placed in lithotomy position and prepped and draped in the usual sterile fashion. A timeout was performed to confirm the patient and procedure. An exam under anesthesia was performed. The cystoscope was passed per urethra and the stricture encountered. It was dilated  sequentially from 14-28 [place]. The 24 French resectoscope sheath with the visual obturator was then passed. The bladder inspected. The loop and handle were passed and I resected at 5:00 and 7:00 down to the inferior and then the median lobe tissue. The bladder neck was widely patent from the prior photo vaporization and not resected. Coming just inside the bladder neck the right lateral lobe residual tissue was resected and then the left lateral lobe residual tissue was resected. All the chips were evacuated as I went along. Hemostasis was excellent. I left some residual apical tissue. At low-pressure again hemostasis was excellent. The bladder was refilled and the scope removed. A 20 French coud catheter was advanced the balloon was filled to 15 mL seated at the bladder neck and left gravity drainage. Urine was clear. He was awakened taken to recovery room in stable condition.  Complications: None  Blood loss: Minimal  Specimens: TURP chips to pathology  Drains: 20 French coud catheter  Disposition: Patient stable to PACU

## 2015-09-18 NOTE — Progress Notes (Signed)
Catheter care taught to patient and wife, states has had catheters at home before.  Demonstrated care and supplies given  Verbalizes understanding

## 2015-09-18 NOTE — Transfer of Care (Signed)
Immediate Anesthesia Transfer of Care Note  Patient: Edward [name]  Procedure(s) Performed: Procedure(s): CYSTOSCOPY (N/A) TRANSURETHRAL RESECTION OF THE PROSTATE (TURP) (N/A) URETHRA DILATATION (N/A)  Patient Location: PACU  Anesthesia Type:General  Level of Consciousness: awake, alert  and oriented  Airway & Oxygen Therapy: Patient Spontanous Breathing and Patient connected to face mask oxygen  Post-op Assessment: Report given to RN and Post -op Vital signs reviewed and stable  Post vital signs: Reviewed and stable  Last Vitals:  Filed Vitals:   09/18/15 0545  BP: 128/69  Pulse: 64  Temp: 36.9 C  Resp: 18    Complications: No apparent anesthesia complications

## 2015-09-18 NOTE — Anesthesia Procedure Notes (Signed)
Procedure Name: LMA Insertion Date/Time: 09/18/2015 8:04 AM Performed by: Enriqueta Shutter D Pre-anesthesia Checklist: Patient identified, Emergency Drugs available, Suction available and Patient being monitored Patient Re-evaluated:Patient Re-evaluated prior to inductionOxygen Delivery Method: Circle System Utilized Preoxygenation: Pre-oxygenation with 100% oxygen Intubation Type: IV induction Ventilation: Mask ventilation without difficulty LMA Size: 4.0 Tube type: Oral Number of attempts: 1 Placement Confirmation: positive ETCO2 and breath sounds checked- equal and bilateral Tube secured with: Tape Dental Injury: Teeth and Oropharynx as per pre-operative assessment

## 2015-09-18 NOTE — Anesthesia Postprocedure Evaluation (Signed)
Anesthesia Post Note  Patient: Edward [name]  Procedure(s) Performed: Procedure(s) (LRB): CYSTOSCOPY (N/A) TRANSURETHRAL RESECTION OF THE PROSTATE (TURP) (N/A) URETHRA DILATATION (N/A)  Patient location during evaluation: PACU Anesthesia Type: General Level of consciousness: awake and alert and patient cooperative Pain management: pain level controlled Vital Signs Assessment: post-procedure vital signs reviewed and stable Respiratory status: spontaneous breathing and respiratory function stable Cardiovascular status: stable Anesthetic complications: no    Last Vitals:  Filed Vitals:   09/18/15 1042 09/18/15 1145  BP: 104/65 111/71  Pulse: 71 73  Temp: 36.3 C 36.6 C  Resp:  18    Last Pain:  Filed Vitals:   09/18/15 1205  PainSc: 0-No pain                 Hue Frick S

## 2015-09-18 NOTE — Anesthesia Preprocedure Evaluation (Signed)
Anesthesia Evaluation  Patient identified by MRN, date of birth, ID band Patient awake    Reviewed: Allergy & Precautions, NPO status , Patient's Chart, lab work & pertinent test results  Airway Mallampati: II   Neck ROM: full    Dental   Pulmonary former smoker,    breath sounds clear to auscultation       Cardiovascular hypertension, + CAD   Rhythm:regular Rate:Normal     Neuro/Psych    GI/Hepatic   Endo/Other    Renal/GU      Musculoskeletal  (+) Arthritis ,   Abdominal   Peds  Hematology   Anesthesia Other Findings   Reproductive/Obstetrics                             Anesthesia Physical Anesthesia Plan  ASA: III  Anesthesia Plan: General   Post-op Pain Management:    Induction: Intravenous  Airway Management Planned: LMA  Additional Equipment:   Intra-op Plan:   Post-operative Plan:   Informed Consent: I have reviewed the patients History and Physical, chart, labs and discussed the procedure including the risks, benefits and alternatives for the proposed anesthesia with the patient or authorized representative who has indicated his/her understanding and acceptance.     Plan Discussed with: CRNA, Anesthesiologist and Surgeon  Anesthesia Plan Comments:         Anesthesia Quick Evaluation

## 2015-09-18 NOTE — Interval H&P Note (Signed)
History and Physical Interval Note:  09/18/2015 7:45 AM  Edward [name]  has presented today for surgery, with the diagnosis of RECURRENT GROSS HEMATURIA  The various methods of treatment have been discussed with the patient and family. After consideration of risks, benefits and other options for treatment, the patient has consented to  Procedure(s): CYSTOSCOPY (N/A) TRANSURETHRAL RESECTION OF THE PROSTATE (TURP) (N/A) as a surgical intervention . No dysuria and no recurrent gross hematuria. The patient's history has been reviewed, patient examined, no change in status, stable for surgery.  I have reviewed the patient's chart and labs.  Questions were answered to the patient's satisfaction.     [name]

## 2015-09-22 ENCOUNTER — Emergency Department (HOSPITAL_COMMUNITY)
Admission: EM | Admit: 2015-09-22 | Discharge: 2015-09-22 | Disposition: A | Discharge status: Home / Self Care | Payer: Medicare Other | Attending: Emergency Medicine | Admitting: Emergency Medicine

## 2015-09-22 ENCOUNTER — Encounter (HOSPITAL_COMMUNITY): Payer: Self-pay | Admitting: Emergency Medicine

## 2015-09-22 DIAGNOSIS — E785 Hyperlipidemia, unspecified: Secondary | ICD-10-CM | POA: Diagnosis not present

## 2015-09-22 DIAGNOSIS — Z79899 Other long term (current) drug therapy: Secondary | ICD-10-CM | POA: Diagnosis not present

## 2015-09-22 DIAGNOSIS — M1990 Unspecified osteoarthritis, unspecified site: Secondary | ICD-10-CM | POA: Insufficient documentation

## 2015-09-22 DIAGNOSIS — T83098A Other mechanical complication of other indwelling urethral catheter, initial encounter: Secondary | ICD-10-CM | POA: Insufficient documentation

## 2015-09-22 DIAGNOSIS — I10 Essential (primary) hypertension: Secondary | ICD-10-CM | POA: Diagnosis not present

## 2015-09-22 DIAGNOSIS — Y658 Other specified misadventures during surgical and medical care: Secondary | ICD-10-CM | POA: Diagnosis not present

## 2015-09-22 DIAGNOSIS — Z960 Presence of urogenital implants: Secondary | ICD-10-CM | POA: Insufficient documentation

## 2015-09-22 DIAGNOSIS — Z87442 Personal history of urinary calculi: Secondary | ICD-10-CM | POA: Insufficient documentation

## 2015-09-22 DIAGNOSIS — Z87891 Personal history of nicotine dependence: Secondary | ICD-10-CM | POA: Diagnosis not present

## 2015-09-22 DIAGNOSIS — Z792 Long term (current) use of antibiotics: Secondary | ICD-10-CM | POA: Diagnosis not present

## 2015-09-22 DIAGNOSIS — Z87438 Personal history of other diseases of male genital organs: Secondary | ICD-10-CM | POA: Insufficient documentation

## 2015-09-22 DIAGNOSIS — Z7982 Long term (current) use of aspirin: Secondary | ICD-10-CM | POA: Diagnosis not present

## 2015-09-22 DIAGNOSIS — I2510 Atherosclerotic heart disease of native coronary artery without angina pectoris: Secondary | ICD-10-CM | POA: Insufficient documentation

## 2015-09-22 DIAGNOSIS — Z9861 Coronary angioplasty status: Secondary | ICD-10-CM | POA: Insufficient documentation

## 2015-09-22 DIAGNOSIS — T85698A Other mechanical complication of other specified internal prosthetic devices, implants and grafts, initial encounter: Secondary | ICD-10-CM

## 2015-09-22 MED ORDER — LIDOCAINE HCL 2 % EX GEL
1.0000 "application " | Freq: Once | CUTANEOUS | Status: AC
  Administered 2015-09-22: 1 via URETHRAL
  Filled 2015-09-22: qty 11

## 2015-09-22 NOTE — ED Notes (Signed)
Flushed foley cath with 500cc NS.  Multiple clots noted.  Minimal 29cc residual flush left in bladder. Post flushing urine is clear of clots at this time.  Pt offered drink and new leg bag placed to monitor return.  PA made aware

## 2015-09-22 NOTE — ED Notes (Signed)
Bladder scan done, scan only showed between 50-75cc

## 2015-09-22 NOTE — ED Notes (Signed)
Pt complaint of foley catheter occluded since 0100.

## 2015-09-22 NOTE — ED Provider Notes (Signed)
CSN: [id number]     Arrival date & time 09/22/15  1248 History   First MD Initiated Contact with Patient 09/22/15 1304     Chief Complaint  Patient presents with  . Foley Catheter Problem     HPI   75 year old male presents today with catheter problems. Patient reports that on 09/18/2015 ( 4 days ago) patient had dilation of urethral stricture, transurethral resection of prostate. Patient reports that the catheter was working appropriately until approximately 1 AM this morning. He reports since then he has had no drainage into his leg bag. He notes some small clots in the previous right. Patient states that he is able to sit on the toilet and force urine around the catheter, but none to go through the catheter. Patient denies any abdominal pain, only notes pain to the penis from the catheter. He denies any fever, chills, nausea, vomiting.  Urologist: Mena Goes MD    Past Medical History  Diagnosis Date  . Hypertension   . Hyperlipidemia   . History of kidney stones   . Elevated PSA   . Arthritis   . Frequency of urination   . Urgency of urination   . Nocturia   . Coronary artery disease CARIOLOGIST-- DR Donnie Aho    ANGIOPLASTY TO LAD  . Spermatocele     left  . BPH (benign prostatic hypertrophy)   . At risk for sleep apnea     STOP-BANG = 4    SENT TO PCP 02-10-215   Past Surgical History  Procedure Laterality Date  . Umbilical hernia repair  06-02-2008  . Shoulder arthroscopy with debridement and bicep tendon repair Left 04-28-2011  . Inguinal hernia repair Bilateral   . Shoulder arthroscopy Right 2004  . Foot fusion Left     SECONDARY TO FX'S  . Coronary angioplasty  1995  DR [place]    LAD  . Cystoscopy with retrograde pyelogram, ureteroscopy and stent placement Left 03/01/2013    Procedure: CYSTOSCOPY WITH LEFT RETROGRADE PYELOGRAM, LEFT URETEROSCOPY and stent PLACEMENT;  Surgeon: Antony Haste, MD;  Location: [facility];  Service: Urology;   Laterality: Left;  Marland Kitchen Green light laser turp (transurethral resection of prostate N/A 10/04/2013    Procedure: GREEN LIGHT LASER TURP (TRANSURETHRAL RESECTION OF PROSTATE;  Surgeon: Antony Haste, MD;  Location: [facility];  Service: Urology;  Laterality: N/A;  . Spermatocelectomy Left 10/04/2013    Procedure: LEFT SPERMATOCELECTOMY;  Surgeon: Antony Haste, MD;  Location: [facility];  Service: Urology;  Laterality: Left;  . Transurethral resection of prostate N/A 12/11/2014    Procedure: CYSTOSCOPY, CLOT EVACUATION, WITH FULGURATION ;  Surgeon: Bjorn Pippin, MD;  Location: [facility];  Service: Urology;  Laterality: N/A;  . Cystoscopy N/A 09/18/2015    Procedure: CYSTOSCOPY;  Surgeon: Jerilee Field, MD;  Location: [facility];  Service: Urology;  Laterality: N/A;  . Transurethral resection of prostate N/A 09/18/2015    Procedure: TRANSURETHRAL RESECTION OF THE PROSTATE (TURP);  Surgeon: Jerilee Field, MD;  Location: [facility];  Service: Urology;  Laterality: N/A;   No family history on file. Social History  Substance Use Topics  . Smoking status: Former Smoker -- 0.50 packs/day for 3 years    Types: Cigarettes    Quit date: 12/18/1970  . Smokeless tobacco: Never Used  . Alcohol Use: Yes     Comment: OCCASIONAL    Review of Systems  All other systems reviewed and are negative.   Allergies  Review of patient's allergies  indicates no known allergies.  Home Medications   Prior to Admission medications   Medication Sig Start Date End Date Taking? Authorizing Provider  amLODipine (NORVASC) 10 MG tablet Take 5 mg by mouth 2 (two) times daily.    Yes   aspirin 81 MG tablet Take 1 tablet (81 mg total) by mouth daily. 10/06/13  Yes Jerilee Field, MD  atenolol (TENORMIN) 50 MG tablet Take 25 mg by mouth every evening.    Yes Historical Provider, MD  Coenzyme Q10 (CO Q 10 PO) Take 1 capsule by mouth daily.   Yes Historical Provider, MD  losartan (COZAAR) 100 MG tablet Take 50 mg by mouth 2  (two) times daily.   Yes Historical Provider, MD  MELATONIN PO Take 1 tablet by mouth at bedtime as needed (Sleep).   Yes Historical Provider, MD  Omega-3 Fatty Acids (FISH OIL PO) Take by mouth.   Yes Historical Provider, MD  rosuvastatin (CRESTOR) 10 MG tablet Take 10 mg by mouth every evening.    Yes Historical Provider, MD  sulfamethoxazole-trimethoprim (BACTRIM,SEPTRA) 400-80 MG tablet Take 1 tablet by mouth See admin instructions. Started 09/07/15. Take 1 tablet twice daily for 10 days, then 1 tablet daily for 10 days, then 1/2 tablet for 10 days.   Yes Historical Provider, MD  traMADol (ULTRAM) 50 MG tablet Take 1 tablet (50 mg total) by mouth every 6 (six) hours as needed for moderate pain. 09/18/15  Yes Jerilee Field, MD  VITAMIN E PO Take by mouth.   Yes Historical Provider, MD  nitrofurantoin, macrocrystal-monohydrate, (MACROBID) 100 MG capsule Take 1 capsule (100 mg total) by mouth at bedtime. 09/18/15   Jerilee Field, MD   BP 129/73 mmHg  Pulse 81  Temp(Src) 97.8 F (36.6 C) (Oral)  Resp 16  SpO2 100%   Physical Exam  Constitutional: He is oriented to person, place, and time. He appears well-developed and well-nourished.  HENT:  Head: Normocephalic and atraumatic.  Eyes: Conjunctivae are normal. Pupils are equal, round, and reactive to light. Right eye exhibits no discharge. Left eye exhibits no discharge. No scleral icterus.  Neck: Normal range of motion. No JVD present. No tracheal deviation present.  Pulmonary/Chest: Effort normal. No stridor.  Abdominal: Soft. He exhibits no distension and no mass. There is no tenderness. There is no rebound and no guarding.  Genitourinary:  Catheter in place with no surrounding infection, discharge, or edema   Neurological: He is alert and oriented to person, place, and time. Coordination normal.  Skin: Skin is warm and dry. No rash noted. No erythema. No pallor.  Psychiatric: He has a normal mood and affect. His behavior is normal.  Judgment and thought content normal.  Nursing note and vitals reviewed.     ED Course  Procedures (including critical care time) Labs Review Labs Reviewed - No data to display  Imaging Review No results found. I have personally reviewed and evaluated these images and lab results as part of my medical decision-making.   EKG Interpretation None      MDM   Final diagnoses:  Obstruction of catheter, initial encounter   Labs:  Imaging:  Consults:  Therapeutics:  Discharge Meds:   Assessment/Plan: 75 year old male presents today with catheter blockage. Patient did not have significant amount of urine in his bladder, nursing staff was able to flush the catheter with numerous blood clots, patient free flowing at this time with no acute pain or complaints. He will be instructed to follow up with his urologist  as previously scheduled, return to the emergency room if any new or worsening signs or symptoms presented. Patient verbalized understanding and agreement for today's plan and had no further questions or concerns at time of discharge.        Eyvonne Mechanic, PA-C 09/22/15 1534  Rolland Porter, MD 09/26/15 [phone]

## 2015-09-22 NOTE — Discharge Instructions (Signed)
Please read attached information. If you experience any new or worsening signs or symptoms please return to the emergency room for evaluation. Please follow-up with your primary care provider or specialist as discussed. Please use medication prescribed only as directed and discontinue taking if you have any concerning signs or symptoms.   °

## 2015-10-08 ENCOUNTER — Encounter (HOSPITAL_COMMUNITY): Payer: Self-pay

## 2015-10-08 ENCOUNTER — Emergency Department (HOSPITAL_COMMUNITY)
Admission: EM | Admit: 2015-10-08 | Discharge: 2015-10-09 | Disposition: A | Discharge status: Home / Self Care | Payer: Medicare Other | Attending: Emergency Medicine | Admitting: Emergency Medicine

## 2015-10-08 DIAGNOSIS — Z87438 Personal history of other diseases of male genital organs: Secondary | ICD-10-CM | POA: Diagnosis not present

## 2015-10-08 DIAGNOSIS — M1990 Unspecified osteoarthritis, unspecified site: Secondary | ICD-10-CM | POA: Diagnosis not present

## 2015-10-08 DIAGNOSIS — I2510 Atherosclerotic heart disease of native coronary artery without angina pectoris: Secondary | ICD-10-CM | POA: Diagnosis not present

## 2015-10-08 DIAGNOSIS — Z792 Long term (current) use of antibiotics: Secondary | ICD-10-CM | POA: Insufficient documentation

## 2015-10-08 DIAGNOSIS — R339 Retention of urine, unspecified: Secondary | ICD-10-CM | POA: Insufficient documentation

## 2015-10-08 DIAGNOSIS — I10 Essential (primary) hypertension: Secondary | ICD-10-CM | POA: Insufficient documentation

## 2015-10-08 DIAGNOSIS — Z7982 Long term (current) use of aspirin: Secondary | ICD-10-CM | POA: Insufficient documentation

## 2015-10-08 DIAGNOSIS — Z87442 Personal history of urinary calculi: Secondary | ICD-10-CM | POA: Diagnosis not present

## 2015-10-08 DIAGNOSIS — Z79899 Other long term (current) drug therapy: Secondary | ICD-10-CM | POA: Insufficient documentation

## 2015-10-08 DIAGNOSIS — E785 Hyperlipidemia, unspecified: Secondary | ICD-10-CM | POA: Insufficient documentation

## 2015-10-08 DIAGNOSIS — R310 Gross hematuria: Secondary | ICD-10-CM | POA: Diagnosis not present

## 2015-10-08 DIAGNOSIS — Z87891 Personal history of nicotine dependence: Secondary | ICD-10-CM | POA: Insufficient documentation

## 2015-10-08 DIAGNOSIS — R1030 Lower abdominal pain, unspecified: Secondary | ICD-10-CM | POA: Diagnosis not present

## 2015-10-08 DIAGNOSIS — R338 Other retention of urine: Secondary | ICD-10-CM

## 2015-10-08 DIAGNOSIS — Z9861 Coronary angioplasty status: Secondary | ICD-10-CM | POA: Insufficient documentation

## 2015-10-08 LAB — I-STAT CREATININE, ED: CREATININE: 1.3 mg/dL — AB (ref 0.61–1.24)

## 2015-10-08 MED ORDER — LIDOCAINE HCL 2 % EX GEL
1.0000 "application " | Freq: Once | CUTANEOUS | Status: AC
  Administered 2015-10-08: 1 via URETHRAL
  Filled 2015-10-08: qty 11

## 2015-10-08 NOTE — ED Notes (Signed)
MD at bedside. 

## 2015-10-08 NOTE — ED Notes (Signed)
Pt reports dysuria and 6/10 flank pain experienced since this AM. Pt has hx of prostate surgery on 1/31. Pt reports he has x1 episode of bloody urine within the last hr. Pt A+OX4, speaking in complete sentences, and ambulatory.

## 2015-10-08 NOTE — ED Provider Notes (Signed)
CSN: [id number]     Arrival date & time 10/08/15  1924 History   First MD Initiated Contact with Patient 10/08/15 2144     Chief Complaint  Patient presents with  . Post-op Problem  . Urinary Retention     (Consider location/radiation/quality/duration/timing/severity/associated sxs/prior Treatment) HPI   75 year old male with history of benign prostatic hypertrophy status post prostate surgery on 1/31 who presents with complaints of flank pain and dysuria. Patient states 2 years ago his urologist told him that he has elevated PSA of 11 and with enlarged prostate. He recommend some laser procedure in which the patient agrees. Since the procedure he has had intermittent episodes of passing blood and clots while urinating. He has had several subsequent prostate surgeres to fix the bleeding sites with the last surgery 3 weeks ago. This morning he developed some low back pain and subsequently having burning urination along with passing clots. He also endorsed urinary frequency but unable to urinate and when he does he is passing clots. He also attempted to self cath at home but was unsuccessful due to the clots. He admits that he has a cold symptoms for the past week but felt that it is unrelated to this particular episode. At this time he endorsed 4 out of 10 bilateral flank pain with lower abd pain. he denies any fever, chills, chest pain, difficulty breathing. He is not on any blood thinner medication.  Past Medical History  Diagnosis Date  . Hypertension   . Hyperlipidemia   . History of kidney stones   . Elevated PSA   . Arthritis   . Frequency of urination   . Urgency of urination   . Nocturia   . Coronary artery disease CARIOLOGIST-- DR Donnie Aho    ANGIOPLASTY TO LAD  . Spermatocele     left  . BPH (benign prostatic hypertrophy)   . At risk for sleep apnea     STOP-BANG = 4    SENT TO PCP 02-10-215   Past Surgical History  Procedure Laterality Date  . Umbilical hernia repair   06-02-2008  . Shoulder arthroscopy with debridement and bicep tendon repair Left 04-28-2011  . Inguinal hernia repair Bilateral   . Shoulder arthroscopy Right 2004  . Foot fusion Left     SECONDARY TO FX'S  . Coronary angioplasty  1995  DR [place]    LAD  . Cystoscopy with retrograde pyelogram, ureteroscopy and stent placement Left 03/01/2013    Procedure: CYSTOSCOPY WITH LEFT RETROGRADE PYELOGRAM, LEFT URETEROSCOPY and stent PLACEMENT;  Surgeon: Antony Haste, MD;  Location: [facility];  Service: Urology;  Laterality: Left;  Marland Kitchen Green light laser turp (transurethral resection of prostate N/A 10/04/2013    Procedure: GREEN LIGHT LASER TURP (TRANSURETHRAL RESECTION OF PROSTATE;  Surgeon: Antony Haste, MD;  Location: [facility];  Service: Urology;  Laterality: N/A;  . Spermatocelectomy Left 10/04/2013    Procedure: LEFT SPERMATOCELECTOMY;  Surgeon: Antony Haste, MD;  Location: [facility];  Service: Urology;  Laterality: Left;  . Transurethral resection of prostate N/A 12/11/2014    Procedure: CYSTOSCOPY, CLOT EVACUATION, WITH FULGURATION ;  Surgeon: Bjorn Pippin, MD;  Location: [facility];  Service: Urology;  Laterality: N/A;  . Cystoscopy N/A 09/18/2015    Procedure: CYSTOSCOPY;  Surgeon: Jerilee Field, MD;  Location: [facility];  Service: Urology;  Laterality: N/A;  . Transurethral resection of prostate N/A 09/18/2015    Procedure: TRANSURETHRAL RESECTION OF THE PROSTATE (TURP);  Surgeon: Jerilee Field, MD;  Location: [facility];  Service: Urology;  Laterality: N/A;   History reviewed. No pertinent family history. Social History  Substance Use Topics  . Smoking status: Former Smoker -- 0.50 packs/day for 3 years    Types: Cigarettes    Quit date: 12/18/1970  . Smokeless tobacco: Never Used  . Alcohol Use: Yes     Comment: OCCASIONAL    Review of Systems  All other systems reviewed and are negative.     Allergies  Review of patient's allergies indicates no  known allergies.  Home Medications   Prior to Admission medications   Medication Sig Start Date End Date Taking? Authorizing Provider  amLODipine (NORVASC) 10 MG tablet Take 5 mg by mouth 2 (two) times daily.       aspirin 81 MG tablet Take 1 tablet (81 mg total) by mouth daily. 10/06/13   Jerilee Field, MD  atenolol (TENORMIN) 50 MG tablet Take 25 mg by mouth every evening.     Historical Provider, MD  Coenzyme Q10 (CO Q 10 PO) Take 1 capsule by mouth daily.    Historical Provider, MD  losartan (COZAAR) 100 MG tablet Take 50 mg by mouth 2 (two) times daily.    Historical Provider, MD  MELATONIN PO Take 1 tablet by mouth at bedtime as needed (Sleep).    Historical Provider, MD  nitrofurantoin, macrocrystal-monohydrate, (MACROBID) 100 MG capsule Take 1 capsule (100 mg total) by mouth at bedtime. 09/18/15   Jerilee Field, MD  Omega-3 Fatty Acids (FISH OIL PO) Take by mouth.    Historical Provider, MD  rosuvastatin (CRESTOR) 10 MG tablet Take 10 mg by mouth every evening.     Historical Provider, MD  sulfamethoxazole-trimethoprim (BACTRIM,SEPTRA) 400-80 MG tablet Take 1 tablet by mouth See admin instructions. Started 09/07/15. Take 1 tablet twice daily for 10 days, then 1 tablet daily for 10 days, then 1/2 tablet for 10 days.    Historical Provider, MD  traMADol (ULTRAM) 50 MG tablet Take 1 tablet (50 mg total) by mouth every 6 (six) hours as needed for moderate pain. 09/18/15   Jerilee Field, MD  VITAMIN E PO Take by mouth.    Historical Provider, MD   BP 108/71 mmHg  Pulse 92  Temp(Src) 98.3 F (36.8 C) (Oral)  Resp 16  SpO2 97% Physical Exam  Constitutional: He appears well-developed and well-nourished. No distress.  HENT:  Head: Atraumatic.  Eyes: Conjunctivae are normal.  Neck: Neck supple.  Cardiovascular: Normal rate and regular rhythm.   Pulmonary/Chest: Effort normal and breath sounds normal.  Abdominal: There is tenderness (Tenderness to suprapubic region with a distended  bladder.).  Genitourinary:  Chaperone present during exam. No testicular tenderness or scrotal tenderness. Uncircumcised penis free of rash. No CVA tenderness.  Neurological: He is alert.  Skin: No rash noted.  Psychiatric: He has a normal mood and affect.  Nursing note and vitals reviewed.   ED Course  Procedures (including critical care time) Labs Review Labs Reviewed  URINALYSIS, ROUTINE W REFLEX MICROSCOPIC (NOT AT [facility]) - Abnormal; Notable for the following:    Color, Urine RED (*)    APPearance TURBID (*)    Specific Gravity, Urine 1.034 (*)    Hgb urine dipstick LARGE (*)    Protein, ur >300 (*)    Leukocytes, UA MODERATE (*)    All other components within normal limits  URINE MICROSCOPIC-ADD ON - Abnormal; Notable for the following:    Squamous Epithelial / LPF 0-5 (*)  Bacteria, UA FEW (*)    Casts HYALINE CASTS (*)    All other components within normal limits  I-STAT CREATININE, ED - Abnormal; Notable for the following:    Creatinine, Ser 1.30 (*)    All other components within normal limits  URINE CULTURE     MDM   Final diagnoses:  Gross hematuria  Acute urinary retention    BP 108/71 mmHg  Pulse 92  Temp(Src) 98.3 F (36.8 C) (Oral)  Resp 16  SpO2 97%   10:26 PM Patient with recurrent episodes of passing clots when urinating and having acute urinary retention secondary to blood clots. He has had several prostate surgeries in the past. He is right now having difficulty urinating but able to pass a small amount of clots without any significant amount of urine.  He denies lightheadedness and dizziness to suggest acute anemia due to blood loss. He will need a foley catheter along with bladder irrigation. Care discussed with Dr. Juleen China.    11:21 PM  Bladder scan showed  230 mL of retained urine. An initial attempt of Foley catheter placement using a 6 [place] by our nurse was unsuccessful.  1:18 AM Successful insertion of regular foley.  Pt able to pass  urine and blood, approximately 500 cc and pt felt better.  Pt agrees to f/u with [facility] tomorrow for further management.  Pt understand to return if sxs worsen.  At this time i have low suspicion for UTI causing sxs, although UA showing 6-30 WBC.  Urine culture sent.  Doubt post strep glomerular nephritis eventhough pt has recent URI sxs.  I suspect hematuria and acute urinary retention likely due to prior prostate surgery that has caused him similar complications in the past.      Fayrene Helper, PA-C 10/09/15 0120  Fayrene Helper, PA-C 10/09/15 0125  Raeford Razor, MD 10/11/15 [phone]

## 2015-10-09 LAB — URINALYSIS, ROUTINE W REFLEX MICROSCOPIC
Bilirubin Urine: NEGATIVE
Glucose, UA: NEGATIVE mg/dL
KETONES UR: NEGATIVE mg/dL
NITRITE: NEGATIVE
Specific Gravity, Urine: 1.034 — ABNORMAL HIGH (ref 1.005–1.030)
pH: 6.5 (ref 5.0–8.0)

## 2015-10-09 LAB — URINE MICROSCOPIC-ADD ON

## 2015-10-09 NOTE — ED Notes (Signed)
Attempted to insert way foley to irrigate, not successful.  MD notified

## 2015-10-09 NOTE — Discharge Instructions (Signed)
Please follow up with [facility] tomorrow for further management of your urinary retention.   Acute Urinary Retention, Male Acute urinary retention is the temporary inability to urinate. This is a common problem in older men. As men age their prostates become larger and block the flow of urine from the bladder. This is usually a problem that has come on gradually.  HOME CARE INSTRUCTIONS If you are sent home with a Foley catheter and a drainage system, you will need to discuss the best course of action with your health care provider. While the catheter is in, maintain a good intake of fluids. Keep the drainage bag emptied and lower than your catheter. This is so that contaminated urine will not flow back into your bladder, which could lead to a urinary tract infection. There are two main types of drainage bags. One is a large bag that usually is used at night. It has a good capacity that will allow you to sleep through the night without having to empty it. The second type is called a leg bag. It has a smaller capacity, so it needs to be emptied more frequently. However, the main advantage is that it can be attached by a leg strap and can go underneath your clothing, allowing you the freedom to move about or leave your home. Only take over-the-counter or prescription medicines for pain, discomfort, or fever as directed by your health care provider.  SEEK MEDICAL CARE IF:  You develop a low-grade fever.  You experience spasms or leakage of urine with the spasms. SEEK IMMEDIATE MEDICAL CARE IF:   You develop chills or fever.  Your catheter stops draining urine.  Your catheter falls out.  You start to develop increased bleeding that does not respond to rest and increased fluid intake. MAKE SURE YOU:  Understand these instructions.  Will watch your condition.  Will get help right away if you are not doing well or get worse.   This information is not intended to replace advice given to  you by your health care provider. Make sure you discuss any questions you have with your health care provider.   Document Released: 11/10/2000 Document Revised: 12/19/2014 Document Reviewed: 01/13/2013 Elsevier Interactive Patient Education [employer]

## 2015-10-10 LAB — URINE CULTURE

## 2015-11-15 ENCOUNTER — Ambulatory Visit
Admission: RE | Admit: 2015-11-15 | Discharge: 2015-11-15 | Disposition: A | Discharge status: Home / Self Care | Payer: Medicare Other | Source: Ambulatory Visit | Attending: Cardiology | Admitting: Cardiology

## 2015-11-15 ENCOUNTER — Other Ambulatory Visit: Payer: Self-pay | Admitting: Cardiology

## 2015-11-15 DIAGNOSIS — R079 Chest pain, unspecified: Secondary | ICD-10-CM

## 2016-02-20 DIAGNOSIS — I209 Angina pectoris, unspecified: Secondary | ICD-10-CM | POA: Insufficient documentation

## 2016-07-09 ENCOUNTER — Other Ambulatory Visit: Payer: Self-pay | Admitting: Gastroenterology

## 2016-07-09 DIAGNOSIS — K633 Ulcer of intestine: Secondary | ICD-10-CM

## 2016-07-09 DIAGNOSIS — D509 Iron deficiency anemia, unspecified: Secondary | ICD-10-CM

## 2016-07-18 ENCOUNTER — Ambulatory Visit
Admission: RE | Admit: 2016-07-18 | Discharge: 2016-07-18 | Disposition: A | Discharge status: Home / Self Care | Payer: Medicare Other | Source: Ambulatory Visit | Attending: Gastroenterology | Admitting: Gastroenterology

## 2016-07-18 DIAGNOSIS — K633 Ulcer of intestine: Secondary | ICD-10-CM

## 2016-07-18 DIAGNOSIS — D509 Iron deficiency anemia, unspecified: Secondary | ICD-10-CM

## 2016-07-18 MED ORDER — IOPAMIDOL (ISOVUE-300) INJECTION 61%
100.0000 mL | Freq: Once | INTRAVENOUS | Status: AC | PRN
  Administered 2016-07-18: 100 mL via INTRAVENOUS

## 2016-10-09 ENCOUNTER — Observation Stay (HOSPITAL_COMMUNITY)
Admission: EM | Admit: 2016-10-09 | Discharge: 2016-10-10 | Disposition: A | Discharge status: Home / Self Care | Payer: Medicare Other | Attending: Internal Medicine | Admitting: Internal Medicine

## 2016-10-09 ENCOUNTER — Emergency Department (HOSPITAL_COMMUNITY): Payer: Medicare Other

## 2016-10-09 ENCOUNTER — Encounter (HOSPITAL_COMMUNITY): Payer: Self-pay | Admitting: Nurse Practitioner

## 2016-10-09 DIAGNOSIS — Z87891 Personal history of nicotine dependence: Secondary | ICD-10-CM | POA: Insufficient documentation

## 2016-10-09 DIAGNOSIS — E785 Hyperlipidemia, unspecified: Secondary | ICD-10-CM | POA: Diagnosis not present

## 2016-10-09 DIAGNOSIS — G3189 Other specified degenerative diseases of nervous system: Secondary | ICD-10-CM | POA: Insufficient documentation

## 2016-10-09 DIAGNOSIS — I2510 Atherosclerotic heart disease of native coronary artery without angina pectoris: Secondary | ICD-10-CM | POA: Diagnosis not present

## 2016-10-09 DIAGNOSIS — R4781 Slurred speech: Secondary | ICD-10-CM | POA: Diagnosis not present

## 2016-10-09 DIAGNOSIS — M1990 Unspecified osteoarthritis, unspecified site: Secondary | ICD-10-CM | POA: Insufficient documentation

## 2016-10-09 DIAGNOSIS — R350 Frequency of micturition: Secondary | ICD-10-CM | POA: Insufficient documentation

## 2016-10-09 DIAGNOSIS — N401 Enlarged prostate with lower urinary tract symptoms: Secondary | ICD-10-CM | POA: Diagnosis not present

## 2016-10-09 DIAGNOSIS — R4701 Aphasia: Secondary | ICD-10-CM | POA: Diagnosis not present

## 2016-10-09 DIAGNOSIS — G459 Transient cerebral ischemic attack, unspecified: Secondary | ICD-10-CM | POA: Diagnosis not present

## 2016-10-09 DIAGNOSIS — Z7982 Long term (current) use of aspirin: Secondary | ICD-10-CM | POA: Insufficient documentation

## 2016-10-09 DIAGNOSIS — I10 Essential (primary) hypertension: Secondary | ICD-10-CM | POA: Diagnosis present

## 2016-10-09 DIAGNOSIS — Z7902 Long term (current) use of antithrombotics/antiplatelets: Secondary | ICD-10-CM | POA: Insufficient documentation

## 2016-10-09 DIAGNOSIS — Z955 Presence of coronary angioplasty implant and graft: Secondary | ICD-10-CM | POA: Diagnosis not present

## 2016-10-09 DIAGNOSIS — Z87442 Personal history of urinary calculi: Secondary | ICD-10-CM | POA: Insufficient documentation

## 2016-10-09 HISTORY — DX: Dizziness and giddiness: R42

## 2016-10-09 LAB — COMPREHENSIVE METABOLIC PANEL
ALBUMIN: 4 g/dL (ref 3.5–5.0)
ALT: 22 U/L (ref 17–63)
ANION GAP: 9 (ref 5–15)
AST: 24 U/L (ref 15–41)
Alkaline Phosphatase: 61 U/L (ref 38–126)
BILIRUBIN TOTAL: 0.5 mg/dL (ref 0.3–1.2)
BUN: 18 mg/dL (ref 6–20)
CO2: 23 mmol/L (ref 22–32)
Calcium: 9.6 mg/dL (ref 8.9–10.3)
Chloride: 106 mmol/L (ref 101–111)
Creatinine, Ser: 1.12 mg/dL (ref 0.61–1.24)
GFR calc Af Amer: >60 mL/min (ref >60)
GFR calc non Af Amer: >60 mL/min (ref >60)
Glucose, Bld: 101 mg/dL — ABNORMAL HIGH (ref 65–99)
POTASSIUM: 3.9 mmol/L (ref 3.5–5.1)
SODIUM: 138 mmol/L (ref 135–145)
Total Protein: 6.7 g/dL (ref 6.5–8.1)

## 2016-10-09 LAB — CBC
HCT: 37.3 % — ABNORMAL LOW (ref 39.0–52.0)
Hemoglobin: 12.4 g/dL — ABNORMAL LOW (ref 13.0–17.0)
MCH: 30.8 pg (ref 26.0–34.0)
MCHC: 33.2 g/dL (ref 30.0–36.0)
MCV: 92.6 fL (ref 78.0–100.0)
PLATELETS: 172 10*3/uL (ref 150–400)
RBC: 4.03 MIL/uL — ABNORMAL LOW (ref 4.22–5.81)
RDW: 12.6 % (ref 11.5–15.5)
WBC: 5.7 10*3/uL (ref 4.0–10.5)

## 2016-10-09 LAB — RAPID URINE DRUG SCREEN, HOSP PERFORMED
AMPHETAMINES: NOT DETECTED
BARBITURATES: NOT DETECTED
Benzodiazepines: NOT DETECTED
Cocaine: NOT DETECTED
OPIATES: NOT DETECTED
Tetrahydrocannabinol: NOT DETECTED

## 2016-10-09 LAB — I-STAT CHEM 8, ED
BUN: 20 mg/dL (ref 6–20)
Calcium, Ion: 1.2 mmol/L (ref 1.15–1.40)
Chloride: 102 mmol/L (ref 101–111)
Creatinine, Ser: 1.1 mg/dL (ref 0.61–1.24)
Glucose, Bld: 98 mg/dL (ref 65–99)
HEMATOCRIT: 36 % — AB (ref 39.0–52.0)
HEMOGLOBIN: 12.2 g/dL — AB (ref 13.0–17.0)
Potassium: 3.9 mmol/L (ref 3.5–5.1)
SODIUM: 139 mmol/L (ref 135–145)
TCO2: 25 mmol/L (ref 0–100)

## 2016-10-09 LAB — DIFFERENTIAL
BASOS PCT: 1 %
Basophils Absolute: 0 10*3/uL (ref 0.0–0.1)
EOS ABS: 0.3 10*3/uL (ref 0.0–0.7)
EOS PCT: 5 %
LYMPHS ABS: 1.8 10*3/uL (ref 0.7–4.0)
Lymphocytes Relative: 32 %
Monocytes Absolute: 0.5 10*3/uL (ref 0.1–1.0)
Monocytes Relative: 9 %
Neutro Abs: 3 10*3/uL (ref 1.7–7.7)
Neutrophils Relative %: 53 %

## 2016-10-09 LAB — PROTIME-INR
INR: 1.1
PROTHROMBIN TIME: 14.3 s (ref 11.4–15.2)

## 2016-10-09 LAB — URINALYSIS, ROUTINE W REFLEX MICROSCOPIC
BILIRUBIN URINE: NEGATIVE
Glucose, UA: NEGATIVE mg/dL
HGB URINE DIPSTICK: NEGATIVE
Ketones, ur: NEGATIVE mg/dL
Leukocytes, UA: NEGATIVE
Nitrite: NEGATIVE
PH: 6 (ref 5.0–8.0)
Protein, ur: NEGATIVE mg/dL
SPECIFIC GRAVITY, URINE: 1.017 (ref 1.005–1.030)

## 2016-10-09 LAB — I-STAT TROPONIN, ED: Troponin i, poc: 0 ng/mL (ref 0.00–0.08)

## 2016-10-09 LAB — CBG MONITORING, ED
GLUCOSE-CAPILLARY: 92 mg/dL (ref 65–99)
GLUCOSE-CAPILLARY: 93 mg/dL (ref 65–99)

## 2016-10-09 LAB — APTT: aPTT: 34 seconds (ref 24–36)

## 2016-10-09 MED ORDER — ASPIRIN 325 MG PO TABS
325.0000 mg | ORAL_TABLET | Freq: Every day | ORAL | Status: DC
Start: 2016-10-10 — End: 2016-10-10
  Filled 2016-10-09: qty 1

## 2016-10-09 MED ORDER — ACETAMINOPHEN 160 MG/5ML PO SOLN: 650.0000 mg | ORAL | Status: DC | PRN

## 2016-10-09 MED ORDER — ASPIRIN 300 MG RE SUPP
300.0000 mg | Freq: Every day | RECTAL | Status: DC
  Filled 2016-10-09: qty 1

## 2016-10-09 MED ORDER — LOSARTAN POTASSIUM 50 MG PO TABS
50.0000 mg | ORAL_TABLET | Freq: Two times a day (BID) | ORAL | Status: DC
  Administered 2016-10-10 (×2): 50 mg via ORAL
  Filled 2016-10-09 (×2): qty 1

## 2016-10-09 MED ORDER — SODIUM CHLORIDE 0.9 % IV SOLN
INTRAVENOUS | Status: AC
  Administered 2016-10-10: via INTRAVENOUS

## 2016-10-09 MED ORDER — AMLODIPINE BESYLATE 5 MG PO TABS
5.0000 mg | ORAL_TABLET | Freq: Every day | ORAL | Status: DC
  Administered 2016-10-10: 5 mg via ORAL
  Filled 2016-10-09: qty 1

## 2016-10-09 MED ORDER — ACETAMINOPHEN 325 MG PO TABS: 650.0000 mg | ORAL_TABLET | ORAL | Status: DC | PRN

## 2016-10-09 MED ORDER — STROKE: EARLY STAGES OF RECOVERY BOOK
Freq: Once | Status: DC
  Filled 2016-10-09: qty 1

## 2016-10-09 MED ORDER — ROSUVASTATIN CALCIUM 5 MG PO TABS: 10.0000 mg | ORAL_TABLET | Freq: Every evening | ORAL | Status: DC

## 2016-10-09 MED ORDER — ATENOLOL 25 MG PO TABS
25.0000 mg | ORAL_TABLET | Freq: Every day | ORAL | Status: DC
  Administered 2016-10-10: 25 mg via ORAL
  Filled 2016-10-09: qty 1

## 2016-10-09 MED ORDER — SENNOSIDES-DOCUSATE SODIUM 8.6-50 MG PO TABS
1.0000 | ORAL_TABLET | Freq: Every evening | ORAL | Status: DC | PRN
  Filled 2016-10-09: qty 1

## 2016-10-09 MED ORDER — ENOXAPARIN SODIUM 40 MG/0.4ML ~~LOC~~ SOLN
40.0000 mg | Freq: Every day | SUBCUTANEOUS | Status: DC
  Administered 2016-10-10: 40 mg via SUBCUTANEOUS
  Filled 2016-10-09: qty 0.4

## 2016-10-09 MED ORDER — ACETAMINOPHEN 650 MG RE SUPP: 650.0000 mg | RECTAL | Status: DC | PRN

## 2016-10-09 NOTE — ED Notes (Signed)
Patient transported to MRI 

## 2016-10-09 NOTE — ED Provider Notes (Signed)
[facility]Y DEPT Provider Note   CSN: [id number] Arrival date & time: 10/09/16  1714     History   Chief Complaint Chief Complaint  Patient presents with  . Aphasia    HPI Edward [name] is a 76 y.o. male.  HPI Approximate 4 PM today patient developed acute difficulty with his speech, slurred speech, difficult word finding.  He denies weakness of his arms or legs.  His symptoms lasted for about 30-45 minutes before EMS arrived.  On EMS arrival the patient's symptoms resolved.  No prior history of stroke or TIA.  He does have a history of coronary artery disease with prior angioplasty to his LAD.  Family reports speech is normal now.  No recent illness.  No headache.  No weakness of his arms or legs or difficulty with word finding at this time.  Symptoms are moderate to severe when he developed his symptoms.  He spoke with his family member on the phone who reported slurred and difficult to understand speech.   Past Medical History:  Diagnosis Date  . Arthritis   . At risk for sleep apnea    STOP-BANG = 4    SENT TO PCP 02-10-215  . BPH (benign prostatic hypertrophy)   . Coronary artery disease CARIOLOGIST-- DR Donnie Aho   ANGIOPLASTY TO LAD  . Elevated PSA   . Frequency of urination   . History of kidney stones   . Hyperlipidemia   . Hypertension   . Nocturia   . Spermatocele    left  . Urgency of urination   . Vertigo     Patient Active Problem List   Diagnosis Date Noted  . Coronary artery disease   . Acute idiopathic pericarditis   . Hypercholesterolemia   . Hypertension   . Vertigo, intermittent     Past Surgical History:  Procedure Laterality Date  . CORONARY ANGIOPLASTY  1995  DR [place]   LAD  . CYSTOSCOPY N/A 09/18/2015   Procedure: CYSTOSCOPY;  Surgeon: Jerilee Field, MD;  Location: [facility];  Service: Urology;  Laterality: N/A;  . CYSTOSCOPY WITH RETROGRADE PYELOGRAM, URETEROSCOPY AND STENT PLACEMENT Left 03/01/2013   Procedure: CYSTOSCOPY WITH LEFT  RETROGRADE PYELOGRAM, LEFT URETEROSCOPY and stent PLACEMENT;  Surgeon: Antony Haste, MD;  Location: [facility];  Service: Urology;  Laterality: Left;  . FOOT FUSION Left    SECONDARY TO FX'S  . GREEN LIGHT LASER TURP (TRANSURETHRAL RESECTION OF PROSTATE N/A 10/04/2013   Procedure: GREEN LIGHT LASER TURP (TRANSURETHRAL RESECTION OF PROSTATE;  Surgeon: Antony Haste, MD;  Location: [facility];  Service: Urology;  Laterality: N/A;  . INGUINAL HERNIA REPAIR Bilateral   . SHOULDER ARTHROSCOPY Right 2004  . SHOULDER ARTHROSCOPY WITH DEBRIDEMENT AND BICEP TENDON REPAIR Left 04-28-2011  . SPERMATOCELECTOMY Left 10/04/2013   Procedure: LEFT SPERMATOCELECTOMY;  Surgeon: Antony Haste, MD;  Location: [facility];  Service: Urology;  Laterality: Left;  . TRANSURETHRAL RESECTION OF PROSTATE N/A 12/11/2014   Procedure: CYSTOSCOPY, CLOT EVACUATION, WITH FULGURATION ;  Surgeon: Bjorn Pippin, MD;  Location: [facility];  Service: Urology;  Laterality: N/A;  . TRANSURETHRAL RESECTION OF PROSTATE N/A 09/18/2015   Procedure: TRANSURETHRAL RESECTION OF THE PROSTATE (TURP);  Surgeon: Jerilee Field, MD;  Location: [facility];  Service: Urology;  Laterality: N/A;  . UMBILICAL HERNIA REPAIR  06-02-2008       Home Medications    Prior to Admission medications   Medication Sig Start Date End Date Taking? Authorizing Provider  amLODipine (  NORVASC) 10 MG tablet Take 5 mg by mouth daily.    Yes   aspirin 81 MG tablet Take 1 tablet (81 mg total) by mouth daily. 10/06/13  Yes Jerilee Field, MD  atenolol (TENORMIN) 50 MG tablet Take 25 mg by mouth daily.    Yes Historical Provider, MD  Coenzyme Q10 (CO Q 10 PO) Take 1 capsule by mouth daily.   Yes Historical Provider, MD  losartan (COZAAR) 100 MG tablet Take 50 mg by mouth 2 (two) times daily.   Yes Historical Provider, MD  rosuvastatin (CRESTOR) 10 MG tablet Take 10 mg by mouth every evening.    Yes Historical Provider, MD    Family  History No family history on file.  Social History Social History  Substance Use Topics  . Smoking status: Former Smoker    Packs/day: 0.50    Years: 3.00    Types: Cigarettes    Quit date: 12/18/1970  . Smokeless tobacco: Never Used  . Alcohol use Yes     Comment: OCCASIONAL     Allergies   Patient has no known allergies.   Review of Systems Review of Systems  All other systems reviewed and are negative.    Physical Exam Updated Vital Signs BP 150/82   Pulse 70   Temp 98 F (36.7 C) (Oral)   Resp 19   Ht 5\' 6"  (1.676 m)   Wt 164 lb (74.4 kg)   SpO2 98%   BMI 26.47 kg/m   Physical Exam  Constitutional: He is oriented to person, place, and time. He appears well-developed and well-nourished.  HENT:  Head: Normocephalic and atraumatic.  Eyes: EOM are normal. Pupils are equal, round, and reactive to light.  Neck: Normal range of motion.  Cardiovascular: Normal rate, regular rhythm, normal heart sounds and intact distal pulses.   Pulmonary/Chest: Effort normal and breath sounds normal. No respiratory distress.  Abdominal: Soft. He exhibits no distension. There is no tenderness.  Musculoskeletal: Normal range of motion.  Neurological: He is alert and oriented to person, place, and time.  5/5 strength in major muscle groups of  bilateral upper and lower extremities. Speech normal. No facial asymetry.   Skin: Skin is warm and dry.  Psychiatric: He has a normal mood and affect. Judgment normal.  Nursing note and vitals reviewed.    ED Treatments / Results  Labs (all labs ordered are listed, but only abnormal results are displayed) Labs Reviewed  CBC - Abnormal; Notable for the following:       Result Value   RBC 4.03 (*)    Hemoglobin 12.4 (*)    HCT 37.3 (*)    All other components within normal limits  COMPREHENSIVE METABOLIC PANEL - Abnormal; Notable for the following:    Glucose, Bld 101 (*)    All other components within normal limits  I-STAT CHEM 8,  ED - Abnormal; Notable for the following:    Hemoglobin 12.2 (*)    HCT 36.0 (*)    All other components within normal limits  PROTIME-INR  APTT  DIFFERENTIAL  URINALYSIS, ROUTINE W REFLEX MICROSCOPIC  RAPID URINE DRUG SCREEN, HOSP PERFORMED  CBG MONITORING, ED  I-STAT TROPOININ, ED    EKG  EKG Interpretation  Date/Time:  Thursday October 09 2016 17:19:45 EST Ventricular Rate:  82 PR Interval:    QRS Duration: 103 QT Interval:  390 QTC Calculation: 456 R Axis:   51 Text Interpretation:  Sinus rhythm No significant change was found Confirmed by  Aarica Wax  MD, Caryn BeeKEVIN ([id number]) on 10/09/2016 9:57:03 PM       Radiology Mr Brain Wo Contrast  Result Date: 10/09/2016 CLINICAL DATA:  Initial evaluation for acute expressive aphasia. EXAM: MRI HEAD WITHOUT CONTRAST TECHNIQUE: Multiplanar, multiecho pulse sequences of the brain and surrounding structures were obtained without intravenous contrast. COMPARISON:  None available. FINDINGS: Brain: Diffuse prominence of the CSF containing spaces is compatible with generalized age-related cerebral atrophy. Scattered patchy T2/FLAIR hyperintensity within the periventricular and deep white matter both cerebral hemispheres noted, nonspecific, but most like related to chronic small vessel ischemic disease. Changes relatively mild for age. No abnormal foci of restricted diffusion to suggest acute or subacute ischemia. Gray-white matter differentiation maintained. No evidence for acute or chronic intracranial hemorrhage. No evidence for remote cortical infarct. Probable small remote lacunar infarct noted within the left periventricular white matter. No mass lesion, midline shift or mass effect. Ventricles normal in size without evidence for hydrocephalus. No extra-axial fluid collection. Major dural sinuses are grossly patent. Pituitary gland and suprasellar region within normal limits. Vascular: Major intracranial vascular flow voids are preserved. Skull and upper  cervical spine: Craniocervical junction within normal limits. Degenerative thickening at the tectorial membrane with mild canal narrowing at the craniocervical junction. Bone marrow signal intensity within normal limits. No scalp soft tissue abnormality. Sinuses/Orbits: Globes and orbital soft tissues within normal limits. Mild scattered mucosal thickening within the ethmoidal air cells and max O sinus. Paranasal sinuses are otherwise clear. No mastoid effusion. Inner ear structures normal. Other: No other significant finding. IMPRESSION: 1. No acute intracranial infarct or other process identified. 2. 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 Electronically Signed   By: Rise MuBenjamin  McClintock M.D.   On: 10/09/2016 21:11    Procedures Procedures (including critical care time)  Medications Ordered in ED Medications - No data to display   Initial Impression / Assessment and Plan / ED Course  I have reviewed the triage vital signs and the nursing notes.  Pertinent labs & imaging results that were available during my care of the patient were reviewed by me and considered in my medical decision making (see chart for details).     MRI without acute stroke.  Symptoms concerning for TIA.  Resolution of symptoms at time of my evaluation.  Patient be admitted the hospital for TIA workup.  [facility] to admit Dr Roseanne RenoStewart, neurohospitalist  Final Clinical Impressions(s) / ED Diagnoses   Final diagnoses:  Transient cerebral ischemia, unspecified type    New Prescriptions New Prescriptions   No medications on file     Azalia BilisKevin Isaiah Torok, MD 10/09/16 2157

## 2016-10-09 NOTE — Consult Note (Signed)
Admission H&P    Chief Complaint: Transient speech difficulty.  HPI: Edward [name] is an 76 y.o. male history of hypertension, hyperlipidemia, coronary artery disease, arthritis and chronic vertigo, presenting following an episode of speech output difficulty, including word finding difficulty as well as gibberish speech content and dysarthria. Symptoms lasted 10-15 minutes then resolved with no recurrence. He has no history of stroke nor TIA. He's been taking aspirin daily. There was no focal extremity weakness nor numbness associated with his presenting symptoms. MRI of the brain showed no acute findings. NIH stroke score the time of this evaluation was 0.  LSN: 4:00 PM on 10/09/2016 tPA Given: No: Deficits rapidly resolved mRankin:  Past Medical History:  Diagnosis Date  . Arthritis   . At risk for sleep apnea    STOP-BANG = 4    SENT TO PCP 02-10-215  . BPH (benign prostatic hypertrophy)   . Coronary artery disease CARIOLOGIST-- DR Wynonia Lawman   ANGIOPLASTY TO LAD  . Elevated PSA   . Frequency of urination   . History of kidney stones   . Hyperlipidemia   . Hypertension   . Nocturia   . Spermatocele    left  . Urgency of urination   . Vertigo     Past Surgical History:  Procedure Laterality Date  . CORONARY ANGIOPLASTY  1995  DR [place]   LAD  . CYSTOSCOPY N/A 09/18/2015   Procedure: CYSTOSCOPY;  Surgeon: Festus Aloe, MD;  Location: [facility];  Service: Urology;  Laterality: N/A;  . CYSTOSCOPY WITH RETROGRADE PYELOGRAM, URETEROSCOPY AND STENT PLACEMENT Left 03/01/2013   Procedure: CYSTOSCOPY WITH LEFT RETROGRADE PYELOGRAM, LEFT URETEROSCOPY and stent PLACEMENT;  Surgeon: Fredricka Bonine, MD;  Location: [facility];  Service: Urology;  Laterality: Left;  . FOOT FUSION Left    SECONDARY TO FX'S  . GREEN LIGHT LASER TURP (TRANSURETHRAL RESECTION OF PROSTATE N/A 10/04/2013   Procedure: GREEN LIGHT LASER TURP (TRANSURETHRAL RESECTION OF PROSTATE;  Surgeon:  Fredricka Bonine, MD;  Location: [facility];  Service: Urology;  Laterality: N/A;  . INGUINAL HERNIA REPAIR Bilateral   . SHOULDER ARTHROSCOPY Right 2004  . SHOULDER ARTHROSCOPY WITH DEBRIDEMENT AND BICEP TENDON REPAIR Left 04-28-2011  . SPERMATOCELECTOMY Left 10/04/2013   Procedure: LEFT SPERMATOCELECTOMY;  Surgeon: Fredricka Bonine, MD;  Location: [facility];  Service: Urology;  Laterality: Left;  . TRANSURETHRAL RESECTION OF PROSTATE N/A 12/11/2014   Procedure: CYSTOSCOPY, CLOT EVACUATION, WITH FULGURATION ;  Surgeon: Irine Seal, MD;  Location: [facility];  Service: Urology;  Laterality: N/A;  . TRANSURETHRAL RESECTION OF PROSTATE N/A 09/18/2015   Procedure: TRANSURETHRAL RESECTION OF THE PROSTATE (TURP);  Surgeon: Festus Aloe, MD;  Location: [facility];  Service: Urology;  Laterality: N/A;  . UMBILICAL HERNIA REPAIR  06-02-2008    History reviewed. No pertinent family history. Social History:  reports that he quit smoking about 45 years ago. His smoking use included Cigarettes. He has a 1.50 pack-year smoking history. He has never used smokeless tobacco. He reports that he drinks alcohol. He reports that he does not use drugs.  Allergies: No Known Allergies  Medications: Preadmission medications were reviewed by me.  ROS: History obtained from the patient  General ROS: negative for - chills, fatigue, fever, night sweats, weight gain or weight loss Psychological ROS: negative for - behavioral disorder, hallucinations, memory difficulties, mood swings or suicidal ideation Ophthalmic ROS: negative for - blurry vision, double vision, eye pain or loss of vision ENT ROS: negative for -  epistaxis, nasal discharge, oral lesions, sore throat, tinnitus or vertigo Allergy and Immunology ROS: negative for - hives or itchy/watery eyes Hematological and Lymphatic ROS: negative for - bleeding problems, bruising or swollen lymph nodes Endocrine ROS: negative for - galactorrhea, hair pattern  changes, polydipsia/polyuria or temperature intolerance Respiratory ROS: negative for - cough, hemoptysis, shortness of breath or wheezing Cardiovascular ROS: negative for - chest pain, dyspnea on exertion, edema or irregular heartbeat Gastrointestinal ROS: negative for - abdominal pain, diarrhea, hematemesis, nausea/vomiting or stool incontinence Genito-Urinary ROS: negative for - dysuria, hematuria, incontinence or urinary frequency/urgency Musculoskeletal ROS: negative for - joint swelling or muscular weakness Neurological ROS: as noted in HPI Dermatological ROS: negative for rash and skin lesion changes  Physical Examination: Blood pressure 165/81, pulse 75, temperature 98 F (36.7 C), temperature source Oral, resp. rate 17, height _0  (1.676 m), weight 74.4 kg (164 lb), SpO2 100 %.  HEENT-  Normocephalic, no lesions, without obvious abnormality.  Normal external eye and conjunctiva.  Normal TM's bilaterally.  Normal auditory canals and external ears. Normal external nose, mucus membranes and septum.  Normal pharynx. Neck supple with no masses, nodes, nodules or enlargement. Cardiovascular - regular rate and rhythm, S1, S2 normal, no murmur, click, rub or gallop Lungs - chest clear, no wheezing, rales, normal symmetric air entry Abdomen - soft, non-tender; bowel sounds normal; no masses,  no organomegaly Extremities - no joint deformities, effusion, or inflammation  Neurologic Examination: Mental Status: Alert, oriented, thought content appropriate.  Speech fluent without evidence of aphasia. Able to follow commands without difficulty. Cranial Nerves: II-Visual fields were normal. III/IV/VI-Pupils were equal and reacted normally to light. Extraocular movements were full and conjugate.    V/VII-no facial numbness and no facial weakness. VIII-normal. X-normal speech and symmetrical palatal movement. XI: trapezius strength/neck flexion strength normal bilaterally XII-midline tongue  extension with normal strength. Motor: 5/5 bilaterally with normal tone and bulk Sensory: Normal throughout. Deep Tendon Reflexes: 2+ and symmetric. Plantars: Flexor bilaterally Cerebellar: Normal finger-to-nose testing. Carotid auscultation: Normal  Results for orders placed or performed during the hospital encounter of 10/09/16 (from the past 48 hour(s))  CBG monitoring, ED     Status: None   Collection Time: 10/09/16  5:25 PM  Result Value Ref Range   Glucose-Capillary 92 65 - 99 mg/dL  Protime-INR     Status: None   Collection Time: 10/09/16  5:25 PM  Result Value Ref Range   Prothrombin Time 14.3 11.4 - 15.2 seconds   INR 1.10   APTT     Status: None   Collection Time: 10/09/16  5:25 PM  Result Value Ref Range   aPTT 34 24 - 36 seconds  CBC     Status: Abnormal   Collection Time: 10/09/16  5:25 PM  Result Value Ref Range   WBC 5.7 4.0 - 10.5 K/uL   RBC 4.03 (L) 4.22 - 5.81 MIL/uL   Hemoglobin 12.4 (L) 13.0 - 17.0 g/dL   HCT 37.3 (L) 39.0 - 52.0 %   MCV 92.6 78.0 - 100.0 fL   MCH 30.8 26.0 - 34.0 pg   MCHC 33.2 30.0 - 36.0 g/dL   RDW 12.6 11.5 - 15.5 %   Platelets 172 150 - 400 K/uL  Differential     Status: None   Collection Time: 10/09/16  5:25 PM  Result Value Ref Range   Neutrophils Relative % 53 %   Neutro Abs 3.0 1.7 - 7.7 K/uL   Lymphocytes Relative 32 %  Lymphs Abs 1.8 0.7 - 4.0 K/uL   Monocytes Relative 9 %   Monocytes Absolute 0.5 0.1 - 1.0 K/uL   Eosinophils Relative 5 %   Eosinophils Absolute 0.3 0.0 - 0.7 K/uL   Basophils Relative 1 %   Basophils Absolute 0.0 0.0 - 0.1 K/uL  Comprehensive metabolic panel     Status: Abnormal   Collection Time: 10/09/16  5:25 PM  Result Value Ref Range   Sodium 138 135 - 145 mmol/L   Potassium 3.9 3.5 - 5.1 mmol/L   Chloride 106 101 - 111 mmol/L   CO2 23 22 - 32 mmol/L   Glucose, Bld 101 (H) 65 - 99 mg/dL   BUN 18 6 - 20 mg/dL   Creatinine, Ser 1.12 0.61 - 1.24 mg/dL   Calcium 9.6 8.9 - 10.3 mg/dL   Total  Protein 6.7 6.5 - 8.1 g/dL   Albumin 4.0 3.5 - 5.0 g/dL   AST 24 15 - 41 U/L   ALT 22 17 - 63 U/L   Alkaline Phosphatase 61 38 - 126 U/L   Total Bilirubin 0.5 0.3 - 1.2 mg/dL   GFR calc non Af Amer >60 >60 mL/min   GFR calc Af Amer >60 >60 mL/min    Comment: (NOTE) The eGFR has been calculated using the CKD EPI equation. This calculation has not been validated in all clinical situations. eGFR's persistently <60 mL/min signify possible Chronic Kidney Disease.    Anion gap 9 5 - 15  I-stat troponin, ED     Status: None   Collection Time: 10/09/16  5:34 PM  Result Value Ref Range   Troponin i, poc 0.00 0.00 - 0.08 ng/mL   Comment 3            Comment: Due to the release kinetics of cTnI, a negative result within the first hours of the onset of symptoms does not rule out myocardial infarction with certainty. If myocardial infarction is still suspected, repeat the test at appropriate intervals.   I-Stat Chem 8, ED     Status: Abnormal   Collection Time: 10/09/16  5:35 PM  Result Value Ref Range   Sodium 139 135 - 145 mmol/L   Potassium 3.9 3.5 - 5.1 mmol/L   Chloride 102 101 - 111 mmol/L   BUN 20 6 - 20 mg/dL   Creatinine, Ser 1.10 0.61 - 1.24 mg/dL   Glucose, Bld 98 65 - 99 mg/dL   Calcium, Ion 1.20 1.15 - 1.40 mmol/L   TCO2 25 0 - 100 mmol/L   Hemoglobin 12.2 (L) 13.0 - 17.0 g/dL   HCT 36.0 (L) 39.0 - 52.0 %  Urinalysis, Routine w reflex microscopic     Status: None   Collection Time: 10/09/16  6:58 PM  Result Value Ref Range   Color, Urine YELLOW YELLOW   APPearance CLEAR CLEAR   Specific Gravity, Urine 1.017 1.005 - 1.030   pH 6.0 5.0 - 8.0   Glucose, UA NEGATIVE NEGATIVE mg/dL   Hgb urine dipstick NEGATIVE NEGATIVE   Bilirubin Urine NEGATIVE NEGATIVE   Ketones, ur NEGATIVE NEGATIVE mg/dL   Protein, ur NEGATIVE NEGATIVE mg/dL   Nitrite NEGATIVE NEGATIVE   Leukocytes, UA NEGATIVE NEGATIVE  Urine rapid drug screen (hosp performed)not at [facility]     Status: None    Collection Time: 10/09/16  6:58 PM  Result Value Ref Range   Opiates NONE DETECTED NONE DETECTED   Cocaine NONE DETECTED NONE DETECTED   Benzodiazepines NONE DETECTED NONE  DETECTED   Amphetamines NONE DETECTED NONE DETECTED   Tetrahydrocannabinol NONE DETECTED NONE DETECTED   Barbiturates NONE DETECTED NONE DETECTED    Comment:        DRUG SCREEN FOR MEDICAL PURPOSES ONLY.  IF CONFIRMATION IS NEEDED FOR ANY PURPOSE, NOTIFY LAB WITHIN 5 DAYS.        LOWEST DETECTABLE LIMITS FOR URINE DRUG SCREEN Drug Class       Cutoff (ng/mL) Amphetamine      1000 Barbiturate      200 Benzodiazepine   660 Tricyclics       630 Opiates          300 Cocaine          300 THC              50    Mr Brain Wo Contrast  Result Date: 10/09/2016 CLINICAL DATA:  Initial evaluation for acute expressive aphasia. EXAM: MRI HEAD WITHOUT CONTRAST TECHNIQUE: Multiplanar, multiecho pulse sequences of the brain and surrounding structures were obtained without intravenous contrast. COMPARISON:  None available. FINDINGS: Brain: Diffuse prominence of the CSF containing spaces is compatible with generalized age-related cerebral atrophy. Scattered patchy T2/FLAIR hyperintensity within the periventricular and deep white matter both cerebral hemispheres noted, nonspecific, but most like related to chronic small vessel ischemic disease. Changes relatively mild for age. No abnormal foci of restricted diffusion to suggest acute or subacute ischemia. Gray-white matter differentiation maintained. No evidence for acute or chronic intracranial hemorrhage. No evidence for remote cortical infarct. Probable small remote lacunar infarct noted within the left periventricular white matter. No mass lesion, midline shift or mass effect. Ventricles normal in size without evidence for hydrocephalus. No extra-axial fluid collection. Major dural sinuses are grossly patent. Pituitary gland and suprasellar region within normal limits. Vascular: Major  intracranial vascular flow voids are preserved. Skull and upper cervical spine: Craniocervical junction within normal limits. Degenerative thickening at the tectorial membrane with mild canal narrowing at the craniocervical junction. Bone marrow signal intensity within normal limits. No scalp soft tissue abnormality. Sinuses/Orbits: Globes and orbital soft tissues within normal limits. Mild scattered mucosal thickening within the ethmoidal air cells and max O sinus. Paranasal sinuses are otherwise clear. No mastoid effusion. Inner ear structures normal. Other: No other significant finding. IMPRESSION: 1. No acute intracranial infarct or other process identified. 2. 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 Electronically Signed   By: Jeannine Boga M.D.   On: 10/09/2016 21:11    Assessment: 76 y.o. male with multiple risk factors for stroke presenting following a TIA, likely involving left MCA vascular territory.  Stroke Risk Factors - hyperlipidemia and hypertension  Plan: 1. HgbA1c, fasting lipid panel 2. MRA  of the brain without contrast 3. PT consult, OT consult, Speech consult 4. Echocardiogram 5. Carotid dopplers 6. Prophylactic therapy-Antiplatelet med: Aspirin  7. Risk factor modification 8. Telemetry monitoring  C.R. Nicole Kindred, MD [facility] [phone]  10/09/2016, 11:15 PM

## 2016-10-09 NOTE — ED Notes (Signed)
Admitting Provider at bedside. 

## 2016-10-09 NOTE — H&P (Signed)
History and Physical    Edward [name]:[id number] DOB: September 07, 1940 DOA: 10/09/2016  PCP: Hoyle Sauer, MD   Patient coming from: Home  Chief Complaint: Slurred speech, word-finding difficulty   HPI: Edward [name] is a 76 y.o. male with medical history significant for coronary artery disease with angioplasty approximately 25 years ago, hypertension, hyperlipidemia, and chronic vertigo who presents to the emergency department with slurred speech and difficulty with word-finding. Patient reports that he was in his usual state of health, having an uneventful day, when at approximately 4 PM he noticed a vague sense of confusion while reading instructions for a cabinet he was building. He called a family member, and was noted to have slurred speech and difficulty with word finding. They encouraged him to call 911, which she was hesitant to do, and the family member called 911 for him. Patient denies any associated headache, change in vision or hearing, loss of coordination, or focal numbness or weakness. There's been no recent fall or head trauma, no recent alcohol use, no illicit drug use, and no prior experience with similar symptoms. Patient denies any associated chest pain or palpitations. There is been no fevers or chills. Speech difficulty resolved prior to EMS arrival and the patient was transported to the ED for evaluation.   ED Course: Upon arrival to the ED, patient is found to be afebrile, saturating well on room air, and with vital signs stable. EKG features a normal sinus rhythm. Patient was sent for brain MRI which is negative for acute intracranial abnormality and notable only for mild for age and chronic microvascular ischemic disease. Chemistry panel is unremarkable and CBC features a very mild and stable normocytic anemia with hemoglobin 12.4. INR is within normal limits, troponin is undetectable, UDS is negative, and urinalysis is unremarkable. Neurology was consulted by the ED  physician and advised a medical admission for further evaluation and management of suspected TIA. Patient remained hemodynamically stable and in no apparent respiratory distress and will be observed on the telemetry unit.  Review of Systems:  All other systems reviewed and apart from HPI, are negative.  Past Medical History:  Diagnosis Date  . Arthritis   . At risk for sleep apnea    STOP-BANG = 4    SENT TO PCP 02-10-215  . BPH (benign prostatic hypertrophy)   . Coronary artery disease CARIOLOGIST-- DR Donnie Aho   ANGIOPLASTY TO LAD  . Elevated PSA   . Frequency of urination   . History of kidney stones   . Hyperlipidemia   . Hypertension   . Nocturia   . Spermatocele    left  . Urgency of urination   . Vertigo     Past Surgical History:  Procedure Laterality Date  . CORONARY ANGIOPLASTY  1995  DR [place]   LAD  . CYSTOSCOPY N/A 09/18/2015   Procedure: CYSTOSCOPY;  Surgeon: Jerilee Field, MD;  Location: [facility];  Service: Urology;  Laterality: N/A;  . CYSTOSCOPY WITH RETROGRADE PYELOGRAM, URETEROSCOPY AND STENT PLACEMENT Left 03/01/2013   Procedure: CYSTOSCOPY WITH LEFT RETROGRADE PYELOGRAM, LEFT URETEROSCOPY and stent PLACEMENT;  Surgeon: Antony Haste, MD;  Location: [facility];  Service: Urology;  Laterality: Left;  . FOOT FUSION Left    SECONDARY TO FX'S  . GREEN LIGHT LASER TURP (TRANSURETHRAL RESECTION OF PROSTATE N/A 10/04/2013   Procedure: GREEN LIGHT LASER TURP (TRANSURETHRAL RESECTION OF PROSTATE;  Surgeon: Antony Haste, MD;  Location: [facility];  Service: Urology;  Laterality: N/A;  .  INGUINAL HERNIA REPAIR Bilateral   . SHOULDER ARTHROSCOPY Right 2004  . SHOULDER ARTHROSCOPY WITH DEBRIDEMENT AND BICEP TENDON REPAIR Left 04-28-2011  . SPERMATOCELECTOMY Left 10/04/2013   Procedure: LEFT SPERMATOCELECTOMY;  Surgeon: Antony Haste, MD;  Location: [facility];  Service: Urology;  Laterality: Left;  . TRANSURETHRAL RESECTION OF  PROSTATE N/A 12/11/2014   Procedure: CYSTOSCOPY, CLOT EVACUATION, WITH FULGURATION ;  Surgeon: Bjorn Pippin, MD;  Location: [facility];  Service: Urology;  Laterality: N/A;  . TRANSURETHRAL RESECTION OF PROSTATE N/A 09/18/2015   Procedure: TRANSURETHRAL RESECTION OF THE PROSTATE (TURP);  Surgeon: Jerilee Field, MD;  Location: [facility];  Service: Urology;  Laterality: N/A;  . UMBILICAL HERNIA REPAIR  06-02-2008     reports that he quit smoking about 45 years ago. His smoking use included Cigarettes. He has a 1.50 pack-year smoking history. He has never used smokeless tobacco. He reports that he drinks alcohol. He reports that he does not use drugs.  No Known Allergies  History reviewed. No pertinent family history.   Prior to Admission medications   Medication Sig Start Date End Date Taking? Authorizing Provider  amLODipine (NORVASC) 10 MG tablet Take 5 mg by mouth daily.    Yes   aspirin 81 MG tablet Take 1 tablet (81 mg total) by mouth daily. 10/06/13  Yes Jerilee Field, MD  atenolol (TENORMIN) 50 MG tablet Take 25 mg by mouth daily.    Yes Historical Provider, MD  Coenzyme Q10 (CO Q 10 PO) Take 1 capsule by mouth daily.   Yes Historical Provider, MD  losartan (COZAAR) 100 MG tablet Take 50 mg by mouth 2 (two) times daily.   Yes Historical Provider, MD  rosuvastatin (CRESTOR) 10 MG tablet Take 10 mg by mouth every evening.    Yes Historical Provider, MD    Physical Exam: Vitals:   10/09/16 1901 10/09/16 1915 10/09/16 1930 10/09/16 2215  BP:  140/89 150/82 165/81  Pulse: 71 69 70 75  Resp: 16 18 19 17   Temp:      TempSrc:      SpO2: 99% 96% 98% 100%  Weight:      Height:          Constitutional: NAD, calm, comfortable Eyes: PERTLA, lids and conjunctivae normal ENMT: Mucous membranes are moist. Posterior pharynx clear of any exudate or lesions.   Neck: normal, supple, no masses, no thyromegaly Respiratory: clear to auscultation bilaterally, no wheezing, no crackles. Normal  respiratory effort.   Cardiovascular: S1 & S2 heard, regular rate and rhythm. No extremity edema. 2+ pedal pulses. No significant JVD. Abdomen: No distension, no tenderness, no masses palpated. Bowel sounds normal.  Musculoskeletal: no clubbing / cyanosis. No joint deformity upper and lower extremities. Normal muscle tone.  Skin: no significant rashes, lesions, ulcers. Warm, dry, well-perfused. Neurologic: CN 2-12 grossly intact. Sensation intact, DTR normal. Strength 5/5 in all 4 limbs.  Psychiatric: Normal judgment and insight. Alert and oriented x 3. Normal mood and affect.     Labs on Admission: I have personally reviewed following labs and imaging studies  CBC:  Recent Labs Lab 10/09/16 1725 10/09/16 1735  WBC 5.7  --   NEUTROABS 3.0  --   HGB 12.4* 12.2*  HCT 37.3* 36.0*  MCV 92.6  --   PLT 172  --    Basic Metabolic Panel:  Recent Labs Lab 10/09/16 1725 10/09/16 1735  NA 138 139  K 3.9 3.9  CL 106 102  CO2 23  --  GLUCOSE 101* 98  BUN 18 20  CREATININE 1.12 1.10  CALCIUM 9.6  --    GFR: Estimated Creatinine Clearance: 53.2 mL/min (by C-G formula based on SCr of 1.1 mg/dL). Liver Function Tests:  Recent Labs Lab 10/09/16 1725  AST 24  ALT 22  ALKPHOS 61  BILITOT 0.5  PROT 6.7  ALBUMIN 4.0   No results for input(s): LIPASE, AMYLASE in the last 168 hours. No results for input(s): AMMONIA in the last 168 hours. Coagulation Profile:  Recent Labs Lab 10/09/16 1725  INR 1.10   Cardiac Enzymes: No results for input(s): CKTOTAL, CKMB, CKMBINDEX, TROPONINI in the last 168 hours. BNP (last 3 results) No results for input(s): PROBNP in the last 8760 hours. HbA1C: No results for input(s): HGBA1C in the last 72 hours. CBG:  Recent Labs Lab 10/09/16 1725  GLUCAP 92   Lipid Profile: No results for input(s): CHOL, HDL, LDLCALC, TRIG, CHOLHDL, LDLDIRECT in the last 72 hours. Thyroid Function Tests: No results for input(s): TSH, T4TOTAL, FREET4,  T3FREE, THYROIDAB in the last 72 hours. Anemia Panel: No results for input(s): VITAMINB12, FOLATE, FERRITIN, TIBC, IRON, RETICCTPCT in the last 72 hours. Urine analysis:    Component Value Date/Time   COLORURINE YELLOW 10/09/2016 1858   APPEARANCEUR CLEAR 10/09/2016 1858   LABSPEC 1.017 10/09/2016 1858   PHURINE 6.0 10/09/2016 1858   GLUCOSEU NEGATIVE 10/09/2016 1858   HGBUR NEGATIVE 10/09/2016 1858   BILIRUBINUR NEGATIVE 10/09/2016 1858   KETONESUR NEGATIVE 10/09/2016 1858   PROTEINUR NEGATIVE 10/09/2016 1858   UROBILINOGEN 1.0 12/10/2014 2305   NITRITE NEGATIVE 10/09/2016 1858   LEUKOCYTESUR NEGATIVE 10/09/2016 1858   Sepsis Labs: @LABRCNTIP (procalcitonin:4,lacticidven:4) )No results found for this or any previous visit (from the past 240 hour(s)).   Radiological Exams on Admission: Mr Brain Wo Contrast  Result Date: 10/09/2016 CLINICAL DATA:  Initial evaluation for acute expressive aphasia. EXAM: MRI HEAD WITHOUT CONTRAST TECHNIQUE: Multiplanar, multiecho pulse sequences of the brain and surrounding structures were obtained without intravenous contrast. COMPARISON:  None available. FINDINGS: Brain: Diffuse prominence of the CSF containing spaces is compatible with generalized age-related cerebral atrophy. Scattered patchy T2/FLAIR hyperintensity within the periventricular and deep white matter both cerebral hemispheres noted, nonspecific, but most like related to chronic small vessel ischemic disease. Changes relatively mild for age. No abnormal foci of restricted diffusion to suggest acute or subacute ischemia. Gray-white matter differentiation maintained. No evidence for acute or chronic intracranial hemorrhage. No evidence for remote cortical infarct. Probable small remote lacunar infarct noted within the left periventricular white matter. No mass lesion, midline shift or mass effect. Ventricles normal in size without evidence for hydrocephalus. No extra-axial fluid collection. Major  dural sinuses are grossly patent. Pituitary gland and suprasellar region within normal limits. Vascular: Major intracranial vascular flow voids are preserved. Skull and upper cervical spine: Craniocervical junction within normal limits. Degenerative thickening at the tectorial membrane with mild canal narrowing at the craniocervical junction. Bone marrow signal intensity within normal limits. No scalp soft tissue abnormality. Sinuses/Orbits: Globes and orbital soft tissues within normal limits. Mild scattered mucosal thickening within the ethmoidal air cells and max O sinus. Paranasal sinuses are otherwise clear. No mastoid effusion. Inner ear structures normal. Other: No other significant finding. IMPRESSION: 1. No acute intracranial infarct or other process identified. 2. 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 Electronically Signed   By: Rise MuBenjamin  McClintock M.D.   On: 10/09/2016 21:11    EKG: Independently reviewed. Sinus rhythm.  Assessment/Plan  1. Expressive  aphasia and dysarthria, resolved  - Sxs developed approx 4 pm and resolved within ~30 minutes  - MRI brain negative for acute pathology; vitals and basic labs unremarkable  - Primary concern is for TIA; neurology is consulting and much appreciated  - No tPA given resolution of sxs prior to arrival; risk factors include HTN, HLD, ASCVD, age  - Plan to observe on telemetry, check lipid panel and A1c, obtain TTE, MRA brain, carotid dopplers, PT/OT evals - Prophylactic aspirin started, continue Crestor    2. CAD  - No anginal complaints on admission  - Pt reports having angioplasty to LAD ~25 yrs ago - Continue ASA, statin, ARB    3. Hypertension  - BP is at goal  - Continue losartan and Norvasc as tolerated     DVT prophylaxis: sq Lovenox  Code Status: Full  Family Communication: Discussed with patient Disposition Plan: Observe on telemetry Consults called: Neurology Admission status: Observation    Briscoe Deutscher, MD [facility] Pager [phone]  If 7PM-7AM, please contact night-coverage [email or web address] Password [facility]  10/09/2016, 11:25 PM

## 2016-10-09 NOTE — ED Triage Notes (Signed)
Pt was reading directions but noted to have a difficulty understanding directions and difficulty verbalizing thoughts. Expressive aphasia lasted 20 minutes. Fire got BP of 190/84. Upon EMS arrival Patient symptoms resolved. Pt has chronic intermittent dizziness that is present. Pt has no hx of stroke, DVT or PE. Pt takes 81mg  ASA daily. Denies falls of injury to head. Patient has chronic left shoulder pain with decreased ROM. Pt alert and oriented x4.

## 2016-10-10 ENCOUNTER — Observation Stay (HOSPITAL_COMMUNITY): Payer: Medicare Other

## 2016-10-10 ENCOUNTER — Observation Stay (HOSPITAL_BASED_OUTPATIENT_CLINIC_OR_DEPARTMENT_OTHER): Payer: Medicare Other

## 2016-10-10 DIAGNOSIS — G459 Transient cerebral ischemic attack, unspecified: Secondary | ICD-10-CM

## 2016-10-10 DIAGNOSIS — G451 Carotid artery syndrome (hemispheric): Secondary | ICD-10-CM | POA: Diagnosis not present

## 2016-10-10 DIAGNOSIS — I2510 Atherosclerotic heart disease of native coronary artery without angina pectoris: Secondary | ICD-10-CM | POA: Diagnosis not present

## 2016-10-10 LAB — VAS US CAROTID
LCCADDIAS: -16 cm/s
LCCADSYS: -86 cm/s
LEFT ECA DIAS: -7 cm/s
LEFT VERTEBRAL DIAS: 11 cm/s
LICADDIAS: -20 cm/s
LICADSYS: -70 cm/s
Left CCA prox dias: -16 cm/s
Left CCA prox sys: -112 cm/s
Left ICA prox dias: -14 cm/s
Left ICA prox sys: -68 cm/s
RCCADSYS: -77 cm/s
RIGHT ECA DIAS: -13 cm/s
RIGHT VERTEBRAL DIAS: 17 cm/s
Right CCA prox dias: 16 cm/s
Right CCA prox sys: 84 cm/s

## 2016-10-10 LAB — LIPID PANEL
CHOL/HDL RATIO: 3.4 ratio
Cholesterol: 137 mg/dL (ref 0–200)
HDL: 40 mg/dL — ABNORMAL LOW (ref >40)
LDL Cholesterol: 83 mg/dL (ref 0–99)
Triglycerides: 72 mg/dL (ref <150)
VLDL: 14 mg/dL (ref 0–40)

## 2016-10-10 MED ORDER — CLOPIDOGREL BISULFATE 75 MG PO TABS: 75.0000 mg | ORAL_TABLET | Freq: Every day | ORAL | 0 refills | Status: DC

## 2016-10-10 MED ORDER — ROSUVASTATIN CALCIUM 20 MG PO TABS: 10.0000 mg | ORAL_TABLET | Freq: Every evening | ORAL | 0 refills | Status: DC

## 2016-10-10 MED ORDER — CLOPIDOGREL BISULFATE 75 MG PO TABS
75.0000 mg | ORAL_TABLET | Freq: Every day | ORAL | Status: DC
  Administered 2016-10-10: 75 mg via ORAL
  Filled 2016-10-10: qty 1

## 2016-10-10 MED ORDER — ROSUVASTATIN CALCIUM 20 MG PO TABS: 20.0000 mg | ORAL_TABLET | Freq: Every evening | ORAL | Status: DC

## 2016-10-10 NOTE — Progress Notes (Signed)
*  PRELIMINARY RESULTS* Vascular Ultrasound Carotid Duplex (Doppler) has been completed.  Preliminary findings: Bilateral: No significant (1-39%) ICA stenosis. Antegrade vertebral flow.   Farrel DemarkJill Eunice, RDMS, RVT  10/10/2016, 9:05 AM

## 2016-10-10 NOTE — Care Management Note (Signed)
Case Management Note  Patient Details  Name: Edward BeetsVictor X Moser MRN: [id number] Date of Birth: [birth year]  Subjective/Objective:      Pt in with TIA. He is from home with his spouse. Independent at home with no DME.               Action/Plan: Awaiting PT/OT recommendations. CM following for discharge needs and physician orders.  Expected Discharge Date:  10/11/16               Expected Discharge Plan:  Home/Self Care  In-House Referral:     Discharge planning Services     Post Acute Care Choice:    Choice offered to:     DME Arranged:    DME Agency:     HH Arranged:    HH Agency:     Status of Service:  In process, will continue to follow  If discussed at Long Length of Stay Meetings, dates discussed:    Additional Comments:  Kermit BaloKelli F Jeriko Kowalke, RN 10/10/2016, 11:05 AM

## 2016-10-10 NOTE — Progress Notes (Signed)
PT Cancellation Note  Patient Details Name: Edward [name] MRN: [id number] DOB: 25-Oct-1940   Cancelled Treatment:    Reason Eval/Treat Not Completed: PT screened, no needs identified, will sign off. Pt is independent with mobility and ambulating around unit independently. Pt feels like he is at baseline, and does not need PT. If needs change, please re-consult.    Margot ChimesSmith, Nina Mondor Leanne 10/10/2016, 11:26 AM  Margot ChimesBrittany Smith, PT, DPT  Acute Rehabilitation Services  Pager: [phone]

## 2016-10-10 NOTE — Discharge Summary (Signed)
Edward [name] VFI:[id number] DOB: 1941-03-28 DOA: 10/09/2016  PCP: Hoyle Sauer, MD  Admit date: 10/09/2016  Discharge date: 10/10/2016  Admitted From: Home   Disposition:  Home   Recommendations for Outpatient Follow-up:   Follow up with PCP in 1-2 weeks  PCP Please obtain BMP/CBC, 2 view CXR in 1week,  (see Discharge instructions)   PCP Please follow up on the following pending results: Follow on pending echo report   Home Health:None   Equipment/Devices: None  Consultations: Neuro Discharge Condition: Stable   CODE STATUS: Full   Diet Recommendation: Heart Healthy     Chief Complaint  Patient presents with  . Aphasia     Brief history of present illness from the day of admission and additional interim summary    Edward [name] is a 76 y.o. male with medical history significant for coronary artery disease with angioplasty approximately 25 years ago, hypertension, hyperlipidemia, and chronic vertigo who presents to the emergency department with slurred speech and difficulty with word-finding.                                                                 Hospital Course    Expressive aphasia, dysarthria due to TIA. All symptoms resolved. CT head MRI brain unremarkable, carotid duplex unremarkable, LDL was greater than 70 hands statin dose was doubled, he was switched from aspirin to Plavix. Seen by neurology cleared for home discharge. All tests are back and negative except echocardiogram which has been done and results are pending, request PCP to monitor results. Do not anticipate any acute changes.  CAD. Status post angioplasty around 25 years ago, chest pain free no acute issues continue Plavix, statin and beta blocker for secondary prevention.   HTN. On combination of beta blocker and ARB  continue.  Dyslipidemia. Statin dose doubled. Request PCP to recheck lipid panel in 4-6 weeks.  Lab Results  Component Value Date   CHOL 137 10/10/2016   HDL 40 (L) 10/10/2016   LDLCALC 83 10/10/2016   TRIG 72 10/10/2016   CHOLHDL 3.4 10/10/2016    Discharge diagnosis     Principal Problem:   TIA (transient ischemic attack) Active Problems:   Coronary artery disease   Hypertension   Expressive aphasia   Slurred speech    Discharge instructions    Discharge Instructions    Diet - low sodium heart healthy    Complete by:  As directed    Discharge instructions    Complete by:  As directed    Follow with Primary MD Hoyle Sauer, MD in 7 days   Get CBC, CMP, 2 view Chest X ray checked  by Primary MD or SNF MD in 5-7 days ( we routinely change or add medications that can affect your baseline labs and fluid status,  therefore we recommend that you get the mentioned basic workup next visit with your PCP, your PCP may decide not to get them or add new tests based on their clinical decision)  Activity: As tolerated with Full fall precautions use walker/cane & assistance as needed  Disposition Home    Diet:  Heart Healthy   For Heart failure patients - Check your Weight same time everyday, if you gain over 2 pounds, or you develop in leg swelling, experience more shortness of breath or chest pain, call your Primary MD immediately. Follow Cardiac Low Salt Diet and 1.5 lit/day fluid restriction.  On your next visit with your primary care physician please Get Medicines reviewed and adjusted.  Please request your Prim.MD to go over all Hospital Tests and Procedure/Radiological results at the follow up, please get all Hospital records sent to your Prim MD by signing hospital release before you go home.  If you experience worsening of your admission symptoms, develop shortness of breath, life threatening emergency, suicidal or homicidal thoughts you must seek medical attention  immediately by calling 911 or calling your MD immediately  if symptoms less severe.  You Must read complete instructions/literature along with all the possible adverse reactions/side effects for all the Medicines you take and that have been prescribed to you. Take any new Medicines after you have completely understood and accpet all the possible adverse reactions/side effects.   Do not drive, operate heavy machinery, perform activities at heights, swimming or participation in water activities or provide baby sitting services if your were admitted for syncope or siezures until you have seen by Primary MD or a Neurologist and advised to do so again.  Do not drive when taking Pain medications.    Do not take more than prescribed Pain, Sleep and Anxiety Medications  Special Instructions: If you have smoked or chewed Tobacco  in the last 2 yrs please stop smoking, stop any regular Alcohol  and or any Recreational drug use.  Wear Seat belts while driving.   Please note  You were cared for by a hospitalist during your hospital stay. If you have any questions about your discharge medications or the care you received while you were in the hospital after you are discharged, you can call the unit and asked to speak with the hospitalist on call if the hospitalist that took care of you is not available. Once you are discharged, your primary care physician will handle any further medical issues. Please note that NO REFILLS for any discharge medications will be authorized once you are discharged, as it is imperative that you return to your primary care physician (or establish a relationship with a primary care physician if you do not have one) for your aftercare needs so that they can reassess your need for medications and monitor your lab values.   Increase activity slowly    Complete by:  As directed       Discharge Medications   Allergies as of 10/10/2016   No Known Allergies     Medication List      STOP taking these medications   aspirin 81 MG tablet     TAKE these medications   amLODipine 10 MG tablet Commonly known as:  NORVASC Take 5 mg by mouth daily.   atenolol 50 MG tablet Commonly known as:  TENORMIN Take 25 mg by mouth daily.   clopidogrel 75 MG tablet Commonly known as:  PLAVIX Take 1 tablet (75 mg total) by mouth daily. Start  taking on:  10/11/2016   CO Q 10 PO Take 1 capsule by mouth daily.   losartan 100 MG tablet Commonly known as:  COZAAR Take 50 mg by mouth 2 (two) times daily.   rosuvastatin 20 MG tablet Commonly known as:  CRESTOR Take 0.5 tablets (10 mg total) by mouth every evening. What changed:  medication strength       Follow-up Information    Hoyle Sauer, MD Follow up.   Specialty:  Internal Medicine Contact information: [address] [place] [address] [phone]        Hoyle Sauer, MD. Schedule an appointment as soon as possible for a visit in 1 week(s).   Specialty:  Internal Medicine Contact information: [address] [place] [address] [phone]        SETHI,PRAMOD, MD. Schedule an appointment as soon as possible for a visit in 1 week(s).   Specialties:  Neurology, Radiology Contact information: [address] [place] [address] [phone]           Major procedures and Radiology Reports - PLEASE review detailed and final reports thoroughly  -      Vascular Ultrasound  Carotid Duplex (Doppler) has been completed.  Preliminary findings: Bilateral: No significant (1-39%) ICA stenosis. Antegrade vertebral flow.     Mr Brain Wo Contrast  Result Date: 10/09/2016 CLINICAL DATA:  Initial evaluation for acute expressive aphasia. EXAM: MRI HEAD WITHOUT CONTRAST TECHNIQUE: Multiplanar, multiecho pulse sequences of the brain and surrounding structures were obtained without intravenous contrast. COMPARISON:  None available. FINDINGS: Brain: Diffuse prominence of the CSF  containing spaces is compatible with generalized age-related cerebral atrophy. Scattered patchy T2/FLAIR hyperintensity within the periventricular and deep white matter both cerebral hemispheres noted, nonspecific, but most like related to chronic small vessel ischemic disease. Changes relatively mild for age. No abnormal foci of restricted diffusion to suggest acute or subacute ischemia. Gray-white matter differentiation maintained. No evidence for acute or chronic intracranial hemorrhage. No evidence for remote cortical infarct. Probable small remote lacunar infarct noted within the left periventricular white matter. No mass lesion, midline shift or mass effect. Ventricles normal in size without evidence for hydrocephalus. No extra-axial fluid collection. Major dural sinuses are grossly patent. Pituitary gland and suprasellar region within normal limits. Vascular: Major intracranial vascular flow voids are preserved. Skull and upper cervical spine: Craniocervical junction within normal limits. Degenerative thickening at the tectorial membrane with mild canal narrowing at the craniocervical junction. Bone marrow signal intensity within normal limits. No scalp soft tissue abnormality. Sinuses/Orbits: Globes and orbital soft tissues within normal limits. Mild scattered mucosal thickening within the ethmoidal air cells and max O sinus. Paranasal sinuses are otherwise clear. No mastoid effusion. Inner ear structures normal. Other: No other significant finding. IMPRESSION: 1. No acute intracranial infarct or other process identified. 2. 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 Electronically Signed   By: Rise Mu M.D.   On: 10/09/2016 21:11   Mr Maxine Glenn Head/brain KG Cm  Result Date: 10/10/2016 CLINICAL DATA:  Expressive aphasia EXAM: MRA HEAD WITHOUT CONTRAST TECHNIQUE: Angiographic images of the Circle of Willis were obtained using MRA technique without intravenous contrast. COMPARISON:  Brain  MRI 10/09/2016 FINDINGS: Intracranial internal carotid arteries: Normal. Anterior cerebral arteries: Normal. Middle cerebral arteries: Normal. Posterior communicating arteries: Present bilaterally. Posterior cerebral arteries: Normal. Basilar artery: Normal. Vertebral arteries: Left dominant. Normal. Superior cerebellar arteries: Normal. Anterior inferior cerebellar arteries: Not clearly visualized, which is not uncommon. Posterior inferior cerebellar arteries: Normal.  IMPRESSION: Normal intracranial MRA. Electronically Signed   By: Deatra Robinson M.D.   On: 10/10/2016 00:23    Micro Results     No results found for this or any previous visit (from the past 240 hour(s)).  Today   Subjective    Aksh Swart today has no headache,no chest abdominal pain,no new weakness tingling or numbness, feels much better wants to go home today.     Objective   Blood pressure 118/75, pulse (!) 54, temperature 98 F (36.7 C), temperature source Oral, resp. rate 16, height 5\' 7"  (1.702 m), weight 76.2 kg (167 lb 14.4 oz), SpO2 96 %.   Intake/Output Summary (Last 24 hours) at 10/10/16 1429 Last data filed at 10/10/16 0325  Gross per 24 hour  Intake           226.25 ml  Output                0 ml  Net           226.25 ml    Exam Awake Alert, Oriented x 3, No new F.N deficits, Normal affect Stanley.AT,PERRAL Supple Neck,No JVD, No cervical lymphadenopathy appriciated.  Symmetrical Chest wall movement, Good air movement bilaterally, CTAB RRR,No Gallops,Rubs or new Murmurs, No Parasternal Heave +ve B.Sounds, Abd Soft, Non tender, No organomegaly appriciated, No rebound -guarding or rigidity. No Cyanosis, Clubbing or edema, No new Rash or bruise   Data Review   CBC w Diff: Lab Results  Component Value Date   WBC 5.7 10/09/2016   HGB 12.2 (L) 10/09/2016   HCT 36.0 (L) 10/09/2016   PLT 172 10/09/2016   LYMPHOPCT 32 10/09/2016   MONOPCT 9 10/09/2016   EOSPCT 5 10/09/2016   BASOPCT 1 10/09/2016     CMP: Lab Results  Component Value Date   NA 139 10/09/2016   K 3.9 10/09/2016   CL 102 10/09/2016   CO2 23 10/09/2016   BUN 20 10/09/2016   CREATININE 1.10 10/09/2016   PROT 6.7 10/09/2016   ALBUMIN 4.0 10/09/2016   BILITOT 0.5 10/09/2016   ALKPHOS 61 10/09/2016   AST 24 10/09/2016   ALT 22 10/09/2016  .   Total Time in preparing paper work, data evaluation and todays exam - 35 minutes  Leroy Sea M.D on 10/10/2016 at 2:29 PM  [facility]   Office  [phone]

## 2016-10-10 NOTE — Discharge Instructions (Signed)
Follow with Primary MD Hoyle SauerAVVA,RAVISANKAR R, MD in 7 days   Get CBC, CMP, 2 view Chest X ray checked  by Primary MD or SNF MD in 5-7 days ( we routinely change or add medications that can affect your baseline labs and fluid status, therefore we recommend that you get the mentioned basic workup next visit with your PCP, your PCP may decide not to get them or add new tests based on their clinical decision)  Activity: As tolerated with Full fall precautions use walker/cane & assistance as needed  Disposition Home    Diet:  Heart Healthy   For Heart failure patients - Check your Weight same time everyday, if you gain over 2 pounds, or you develop in leg swelling, experience more shortness of breath or chest pain, call your Primary MD immediately. Follow Cardiac Low Salt Diet and 1.5 lit/day fluid restriction.  On your next visit with your primary care physician please Get Medicines reviewed and adjusted.  Please request your Prim.MD to go over all Hospital Tests and Procedure/Radiological results at the follow up, please get all Hospital records sent to your Prim MD by signing hospital release before you go home.  If you experience worsening of your admission symptoms, develop shortness of breath, life threatening emergency, suicidal or homicidal thoughts you must seek medical attention immediately by calling 911 or calling your MD immediately  if symptoms less severe.  You Must read complete instructions/literature along with all the possible adverse reactions/side effects for all the Medicines you take and that have been prescribed to you. Take any new Medicines after you have completely understood and accpet all the possible adverse reactions/side effects.   Do not drive, operate heavy machinery, perform activities at heights, swimming or participation in water activities or provide baby sitting services if your were admitted for syncope or siezures until you have seen by Primary MD or a Neurologist  and advised to do so again.  Do not drive when taking Pain medications.    Do not take more than prescribed Pain, Sleep and Anxiety Medications  Special Instructions: If you have smoked or chewed Tobacco  in the last 2 yrs please stop smoking, stop any regular Alcohol  and or any Recreational drug use.  Wear Seat belts while driving.   Please note  You were cared for by a hospitalist during your hospital stay. If you have any questions about your discharge medications or the care you received while you were in the hospital after you are discharged, you can call the unit and asked to speak with the hospitalist on call if the hospitalist that took care of you is not available. Once you are discharged, your primary care physician will handle any further medical issues. Please note that NO REFILLS for any discharge medications will be authorized once you are discharged, as it is imperative that you return to your primary care physician (or establish a relationship with a primary care physician if you do not have one) for your aftercare needs so that they can reassess your need for medications and monitor your lab values.

## 2016-10-10 NOTE — Care Management Obs Status (Signed)
MEDICARE OBSERVATION STATUS NOTIFICATION   Patient Details  Name: Edward [name] MRN: [id number] Date of Birth: Dec 10, 1940   Medicare Observation Status Notification Given:  Yes (MRI negative)    Kermit BaloKelli F Sylvestre Rathgeber, RN 10/10/2016, 11:02 AM

## 2016-10-10 NOTE — Progress Notes (Signed)
Patient arrived to unit at 0130 alert and oriented x 4. Patient placed on telemtry, verification complete. Patient oriented to unit, health history and current treatment plan reviewed with and patient v/u. Q 2 hour neuro checks and vitals initiated at 0135. RN will continue to monitor.

## 2016-10-10 NOTE — Progress Notes (Signed)
STROKE TEAM PROGRESS NOTE   SUBJECTIVE (INTERVAL HISTORY) His wife is at the bedside.  Overall, he is feeling back to baseline   OBJECTIVE Temp:  [97.9 F (36.6 C)-98.2 F (36.8 C)] 97.9 F (36.6 C) (02/23 0930) Pulse Rate:  [65-82] 72 (02/23 0930) Cardiac Rhythm: Normal sinus rhythm (02/23 0702) Resp:  [11-20] 16 (02/23 0930) BP: (125-165)/(72-99) 137/78 (02/23 0930) SpO2:  [96 %-100 %] 98 % (02/23 0930) Weight:  [74.4 kg (164 lb)-76.2 kg (167 lb 14.4 oz)] 76.2 kg (167 lb 14.4 oz) (02/23 0130)  CBC:  Recent Labs Lab 10/09/16 1725 10/09/16 1735  WBC 5.7  --   NEUTROABS 3.0  --   HGB 12.4* 12.2*  HCT 37.3* 36.0*  MCV 92.6  --   PLT 172  --     Basic Metabolic Panel:  Recent Labs Lab 10/09/16 1725 10/09/16 1735  NA 138 139  K 3.9 3.9  CL 106 102  CO2 23  --   GLUCOSE 101* 98  BUN 18 20  CREATININE 1.12 1.10  CALCIUM 9.6  --    HgbA1c: No results found for: HGBA1C    PHYSICAL EXAM Pleasant elderly male not in distress. . Afebrile. Head is nontraumatic. Neck is supple without bruit.    Cardiac exam no murmur or gallop. Lungs are clear to auscultation. Distal pulses are well felt. Neurological Exam ;  Awake  Alert oriented x 3. Normal speech and language.eye movements full without nystagmus.fundi were not visualized. Vision acuity and fields appear normal. Hearing is normal. Palatal movements are normal. Face symmetric. Tongue midline. Normal strength, tone, reflexes and coordination. Normal sensation. Gait deferred.  ASSESSMENT/PLAN Edward [name] is a 76 y.o. male with history of CAD with PCI 25 years ago, HTN, HLD, arthritis and chronic vertigo presenting with transient speech difficulty. He did not receive IV t-PA due to rapidly resolving deficits.   Left brain TIA   MRI  no acute stroke  MRA  normal  Carotid Doppler  no significant stenosis, 1-39%  2D Echo  pending  LDL 83  HgbA1c pending  Lovenox 40 mg sq daily for VTE prophylaxis  Diet  regular Room service appropriate? Yes; Fluid consistency: Thin  aspirin 81 mg daily prior to admission, now on aspirin 325 mg daily. Given TIA on aspirin, will change to Plavix. Continue Plavix at discharge  Therapy recommendations:  No therapy needs  Disposition:  Return home  Hypertension  Stable BP goal normotensive  Hyperlipidemia  Home meds:  Crestor 10, resumed in hospital  LDL just above goal  Continue statin at discharge  Other Stroke Risk Factors  Advanced age  Occasional  ETOH use  Urine drug screen negative  Coronary artery disease hx PCA without stent  Hospital day # 0  Edward [name] See Amion for Pager information 10/10/2016 10:54 AM  I have personally examined this patient, reviewed notes, independently viewed imaging studies, participated in medical decision making and plan of care.ROS completed by me personally and pertinent positives fully documented  I have made any additions or clarifications directly to the above note. Agree with note above. He presented with transient expressive language difficulties likely due to left brain TIA. Recommend change aspirin to Plavix for stroke prevention and continue ongoing stroke workup. He remains at risk for recurrent stroke, TIA. I had a long discussion the patient and family and answered questions. Greater than 50% time during this 35 minute visit was spent on counseling and coordination  of care about stroke and TIA risk, discussion of evaluation and treatment plan and answering questions.   Edward HeadyPramod Sethi, MD Medical Director [facility] Pager: [phone] 10/10/2016 11:03 AM  To contact Stroke Continuity provider, please refer to [email or web address]. After hours, contact General Neurology

## 2016-10-10 NOTE — Progress Notes (Signed)
  Echocardiogram 2D Echocardiogram has been performed.  Delcie RochENNINGTON, Cumi Sanagustin 10/10/2016, 9:01 AM

## 2016-10-10 NOTE — Progress Notes (Signed)
SLP Cancellation Note  Patient Details Name: Edward BeetsVictor X Cooner MRN: [id number] DOB: 1940/11/22   Cancelled treatment:       Reason Eval/Treat Not Completed: SLP screened, no needs identified, will sign off   Maxcine Hamaiewonsky, Zalia Hautala 10/10/2016, 12:04 PM   Maxcine HamLaura Paiewonsky, M.A. CCC-SLP [phone]

## 2016-10-10 NOTE — Progress Notes (Signed)
Pt discharged home with spouse. Discharge instructions were reviewed with pt. Pt verbalized understanding.

## 2016-10-10 NOTE — Progress Notes (Signed)
OT Cancellation Note  Patient Details Name: Edward BeetsVictor X Remick MRN: [id number] DOB: January 29, 1941   Cancelled Treatment:    Reason Eval/Treat Not Completed: OT screened, no needs identified, will sign off  Avraham Benish, Metro KungLorraine D 10/10/2016, 12:51 PM

## 2016-10-10 NOTE — Plan of Care (Signed)
Problem: Nutrition: Goal: Risk of aspiration will decrease Outcome: Completed/Met Date Met: 10/10/16 Patient passed bedside stroke swallow screen. MD entered regular diet for patient.  Problem: Tissue Perfusion: Goal: Cerebral tissue perfusion will improve (applicable to all stroke diagnoses) Outcome: Progressing Symtoms resolved

## 2016-10-11 LAB — HEMOGLOBIN A1C
Hgb A1c MFr Bld: 5.7 % — ABNORMAL HIGH (ref 4.8–5.6)
MEAN PLASMA GLUCOSE: 117 mg/dL

## 2016-10-15 LAB — ECHOCARDIOGRAM COMPLETE
CHL CUP DOP CALC LVOT VTI: 20.8 cm
E/e' ratio: 7.27
FS: 41 % (ref 28–44)
HEIGHTINCHES: 67 in
IV/PV OW: 0.93
LA vol A4C: 46.3 ml
LA vol index: 25.3 mL/m2
LADIAMINDEX: 1.86 cm/m2
LASIZE: 35 mm
LAVOL: 47.6 mL
LDCA: 4.15 cm2
LEFT ATRIUM END SYS DIAM: 35 mm
LV E/e' medial: 7.27
LV PW d: 10.5 mm — AB (ref 0.6–1.1)
LV TDI E'MEDIAL: 6.25
LV e' LATERAL: 12.8 cm/s
LVEEAVG: 7.27
LVOT SV: 86 mL
LVOT peak vel: 105 cm/s
LVOTD: 23 mm
Lateral S' vel: 13.6 cm/s
MV Peak grad: 3 mmHg
MV pk E vel: 93 m/s
MVPKAVEL: 78.6 m/s
Reg peak vel: 250 cm/s
TAPSE: 18.9 mm
TDI e' lateral: 12.8
TRMAXVEL: 250 cm/s
WEIGHTICAEL: 2686.4 [oz_av]

## 2017-04-14 ENCOUNTER — Other Ambulatory Visit: Payer: Self-pay | Admitting: Dermatology

## 2017-04-21 ENCOUNTER — Other Ambulatory Visit: Payer: Self-pay | Admitting: General Surgery

## 2017-04-21 DIAGNOSIS — K439 Ventral hernia without obstruction or gangrene: Secondary | ICD-10-CM

## 2017-04-28 ENCOUNTER — Ambulatory Visit
Admission: RE | Admit: 2017-04-28 | Discharge: 2017-04-28 | Disposition: A | Discharge status: Home / Self Care | Payer: Medicare Other | Source: Ambulatory Visit | Attending: General Surgery | Admitting: General Surgery

## 2017-04-28 ENCOUNTER — Inpatient Hospital Stay
Admission: RE | Admit: 2017-04-28 | Discharge: 2017-04-28 | Disposition: A | Discharge status: Home / Self Care | Payer: Medicare Other | Source: Ambulatory Visit | Attending: General Surgery | Admitting: General Surgery

## 2017-04-28 DIAGNOSIS — K439 Ventral hernia without obstruction or gangrene: Secondary | ICD-10-CM

## 2017-04-28 MED ORDER — IOPAMIDOL (ISOVUE-300) INJECTION 61%
100.0000 mL | Freq: Once | INTRAVENOUS | Status: AC | PRN
  Administered 2017-04-28: 100 mL via INTRAVENOUS

## 2018-01-14 ENCOUNTER — Observation Stay (HOSPITAL_COMMUNITY)
Admission: EM | Admit: 2018-01-14 | Discharge: 2018-01-15 | Disposition: A | Discharge status: Home / Self Care | Payer: Medicare Other | Attending: Emergency Medicine | Admitting: Emergency Medicine

## 2018-01-14 ENCOUNTER — Other Ambulatory Visit: Payer: Self-pay

## 2018-01-14 ENCOUNTER — Encounter (HOSPITAL_COMMUNITY): Payer: Self-pay | Admitting: Emergency Medicine

## 2018-01-14 DIAGNOSIS — Z7901 Long term (current) use of anticoagulants: Secondary | ICD-10-CM | POA: Diagnosis not present

## 2018-01-14 DIAGNOSIS — R4701 Aphasia: Secondary | ICD-10-CM | POA: Diagnosis present

## 2018-01-14 DIAGNOSIS — Z87891 Personal history of nicotine dependence: Secondary | ICD-10-CM | POA: Insufficient documentation

## 2018-01-14 DIAGNOSIS — Z79899 Other long term (current) drug therapy: Secondary | ICD-10-CM | POA: Diagnosis not present

## 2018-01-14 DIAGNOSIS — I10 Essential (primary) hypertension: Secondary | ICD-10-CM | POA: Diagnosis present

## 2018-01-14 DIAGNOSIS — E7800 Pure hypercholesterolemia, unspecified: Secondary | ICD-10-CM | POA: Diagnosis present

## 2018-01-14 DIAGNOSIS — I2510 Atherosclerotic heart disease of native coronary artery without angina pectoris: Secondary | ICD-10-CM | POA: Diagnosis not present

## 2018-01-14 DIAGNOSIS — G459 Transient cerebral ischemic attack, unspecified: Secondary | ICD-10-CM | POA: Diagnosis not present

## 2018-01-14 LAB — DIFFERENTIAL
Abs Immature Granulocytes: 0 10*3/uL (ref 0.0–0.1)
BASOS ABS: 0.1 10*3/uL (ref 0.0–0.1)
BASOS PCT: 1 %
Eosinophils Absolute: 0.4 10*3/uL (ref 0.0–0.7)
Eosinophils Relative: 8 %
IMMATURE GRANULOCYTES: 0 %
LYMPHS PCT: 25 %
Lymphs Abs: 1.3 10*3/uL (ref 0.7–4.0)
Monocytes Absolute: 0.6 10*3/uL (ref 0.1–1.0)
Monocytes Relative: 12 %
NEUTROS ABS: 2.9 10*3/uL (ref 1.7–7.7)
NEUTROS PCT: 54 %

## 2018-01-14 LAB — PROTIME-INR
INR: 1.01
Prothrombin Time: 13.2 seconds (ref 11.4–15.2)

## 2018-01-14 LAB — CBC
HEMATOCRIT: 39.8 % (ref 39.0–52.0)
Hemoglobin: 12.9 g/dL — ABNORMAL LOW (ref 13.0–17.0)
MCH: 30.7 pg (ref 26.0–34.0)
MCHC: 32.4 g/dL (ref 30.0–36.0)
MCV: 94.8 fL (ref 78.0–100.0)
Platelets: 183 10*3/uL (ref 150–400)
RBC: 4.2 MIL/uL — AB (ref 4.22–5.81)
RDW: 12.6 % (ref 11.5–15.5)
WBC: 5.4 10*3/uL (ref 4.0–10.5)

## 2018-01-14 LAB — I-STAT CHEM 8, ED
BUN: 15 mg/dL (ref 6–20)
CREATININE: 0.9 mg/dL (ref 0.61–1.24)
Calcium, Ion: 1.19 mmol/L (ref 1.15–1.40)
Chloride: 107 mmol/L (ref 101–111)
GLUCOSE: 117 mg/dL — AB (ref 65–99)
HEMATOCRIT: 37 % — AB (ref 39.0–52.0)
HEMOGLOBIN: 12.6 g/dL — AB (ref 13.0–17.0)
Potassium: 4.2 mmol/L (ref 3.5–5.1)
Sodium: 141 mmol/L (ref 135–145)
TCO2: 21 mmol/L — AB (ref 22–32)

## 2018-01-14 LAB — COMPREHENSIVE METABOLIC PANEL
ALT: 21 U/L (ref 17–63)
ANION GAP: 6 (ref 5–15)
AST: 26 U/L (ref 15–41)
Albumin: 3.8 g/dL (ref 3.5–5.0)
Alkaline Phosphatase: 72 U/L (ref 38–126)
BILIRUBIN TOTAL: 0.7 mg/dL (ref 0.3–1.2)
BUN: 14 mg/dL (ref 6–20)
CO2: 23 mmol/L (ref 22–32)
Calcium: 9.3 mg/dL (ref 8.9–10.3)
Chloride: 110 mmol/L (ref 101–111)
Creatinine, Ser: 1.04 mg/dL (ref 0.61–1.24)
Glucose, Bld: 117 mg/dL — ABNORMAL HIGH (ref 65–99)
Potassium: 4.3 mmol/L (ref 3.5–5.1)
Sodium: 139 mmol/L (ref 135–145)
TOTAL PROTEIN: 6.8 g/dL (ref 6.5–8.1)

## 2018-01-14 LAB — I-STAT TROPONIN, ED: TROPONIN I, POC: 0 ng/mL (ref 0.00–0.08)

## 2018-01-14 LAB — APTT: APTT: 33 s (ref 24–36)

## 2018-01-14 NOTE — ED Triage Notes (Addendum)
Around 8pm, pt began to experience speech difficulties, was not able to pronounce the words he was thinking, only lasting 30 minutes. Pt had the same thing happen last year, was admitted w/ a TIA and placed on Plavix (has been compliant).  Pt reports that he is constantly dizzy (vertigo) and has an appointment in the AM for that.  Negative NIH screen.  All symptoms have resolved.

## 2018-01-14 NOTE — ED Notes (Signed)
ED Provider at bedside. 

## 2018-01-15 ENCOUNTER — Emergency Department (HOSPITAL_COMMUNITY): Payer: Medicare Other

## 2018-01-15 ENCOUNTER — Observation Stay (HOSPITAL_COMMUNITY): Payer: Medicare Other

## 2018-01-15 ENCOUNTER — Observation Stay (HOSPITAL_BASED_OUTPATIENT_CLINIC_OR_DEPARTMENT_OTHER): Payer: Medicare Other

## 2018-01-15 DIAGNOSIS — G459 Transient cerebral ischemic attack, unspecified: Secondary | ICD-10-CM | POA: Diagnosis not present

## 2018-01-15 DIAGNOSIS — E7800 Pure hypercholesterolemia, unspecified: Secondary | ICD-10-CM

## 2018-01-15 DIAGNOSIS — I10 Essential (primary) hypertension: Secondary | ICD-10-CM | POA: Diagnosis not present

## 2018-01-15 LAB — LIPID PANEL
CHOL/HDL RATIO: 2.9 ratio
Cholesterol: 117 mg/dL (ref 0–200)
HDL: 41 mg/dL (ref >40)
LDL Cholesterol: 63 mg/dL (ref 0–99)
Triglycerides: 64 mg/dL (ref <150)
VLDL: 13 mg/dL (ref 0–40)

## 2018-01-15 LAB — HEMOGLOBIN A1C
Hgb A1c MFr Bld: 5.5 % (ref 4.8–5.6)
MEAN PLASMA GLUCOSE: 111.15 mg/dL

## 2018-01-15 LAB — ECHOCARDIOGRAM COMPLETE
Height: 66 in
Weight: 2571.45 oz

## 2018-01-15 MED ORDER — CLOPIDOGREL BISULFATE 75 MG PO TABS
75.0000 mg | ORAL_TABLET | Freq: Every day | ORAL | Status: DC
  Administered 2018-01-15: 75 mg via ORAL
  Filled 2018-01-15: qty 1

## 2018-01-15 MED ORDER — ACETAMINOPHEN 325 MG PO TABS: 650.0000 mg | ORAL_TABLET | ORAL | Status: DC | PRN

## 2018-01-15 MED ORDER — ENOXAPARIN SODIUM 40 MG/0.4ML ~~LOC~~ SOLN
40.0000 mg | SUBCUTANEOUS | Status: DC
  Administered 2018-01-15: 40 mg via SUBCUTANEOUS
  Filled 2018-01-15 (×2): qty 0.4

## 2018-01-15 MED ORDER — STROKE: EARLY STAGES OF RECOVERY BOOK
Freq: Once | Status: AC
  Administered 2018-01-15: 10:00:00
  Filled 2018-01-15: qty 1

## 2018-01-15 MED ORDER — ACETAMINOPHEN 650 MG RE SUPP: 650.0000 mg | RECTAL | Status: DC | PRN

## 2018-01-15 MED ORDER — ASPIRIN EC 325 MG PO TBEC: 325.0000 mg | DELAYED_RELEASE_TABLET | Freq: Every day | ORAL | 0 refills | Status: AC

## 2018-01-15 MED ORDER — ROSUVASTATIN CALCIUM 5 MG PO TABS: 10.0000 mg | ORAL_TABLET | Freq: Every evening | ORAL | Status: DC

## 2018-01-15 MED ORDER — GADOBENATE DIMEGLUMINE 529 MG/ML IV SOLN
15.0000 mL | Freq: Once | INTRAVENOUS | Status: AC | PRN
  Administered 2018-01-15: 15 mL via INTRAVENOUS

## 2018-01-15 MED ORDER — ACETAMINOPHEN 160 MG/5ML PO SOLN: 650.0000 mg | ORAL | Status: DC | PRN

## 2018-01-15 MED ORDER — SODIUM CHLORIDE 0.9 % IV BOLUS
1000.0000 mL | Freq: Once | INTRAVENOUS | Status: AC
  Administered 2018-01-15: 1000 mL via INTRAVENOUS

## 2018-01-15 NOTE — Evaluation (Signed)
Physical Therapy Evaluation Patient Details Name: Edward [name] MRN: [id number] DOB: [birth year] Today's Date: 01/15/2018   History of Present Illness  77 y.o. male with medical history significant of HTN, HLD, prior TIA.  Patient presents to the ED today for evaluation of aphasia. Pt does report report having constant "dizziness". States that this is been ongoing for a number of years.  Clinical Impression  Patient presents with history of vertigo and with continued balance and mild vertigo symptoms.  Currently independent with mobility, but with higher level balance issues per pt.  Feel he can benefit from outpatient vestibular rehab at d/c.  No further acute PT needs as likely for d/c home today.    Follow Up Recommendations Outpatient PT(for vestibular rehab)    Equipment Recommendations  None recommended by PT    Recommendations for Other Services       Precautions / Restrictions Precautions Precautions: None      Mobility  Bed Mobility Overal bed mobility: Modified Independent                Transfers Overall transfer level: Modified independent Equipment used: None                Ambulation/Gait Ambulation/Gait assistance: Independent Ambulation Distance (Feet): 20 Feet Assistive device: None Gait Pattern/deviations: Wide base of support;WFL(Within Functional Limits);Step-through pattern     General Gait Details: in room for screen as focus on vestibular exam and education  Stairs            Wheelchair Mobility    Modified Rankin (Stroke Patients Only)       Balance Overall balance assessment: Mild deficits observed, not formally tested                                           Pertinent Vitals/Pain Pain Assessment: No/denies pain    Home Living Family/patient expects to be discharged to:: Private residence Living Arrangements: Spouse/significant other Available Help at Discharge: Family Type of Home: House        Home Layout: Two level        Prior Function Level of Independence: Independent         Comments: drives, completes iADLs      Hand Dominance   Dominant Hand: Right    Extremity/Trunk Assessment   Upper Extremity Assessment Upper Extremity Assessment: Defer to OT evaluation    Lower Extremity Assessment Lower Extremity Assessment: Overall WFL for tasks assessed       Communication   Communication: No difficulties  Cognition Arousal/Alertness: Awake/alert Behavior During Therapy: WFL for tasks assessed/performed Overall Cognitive Status: Within Functional Limits for tasks assessed                                        General Comments    Vestibular Assessment - 01/15/18 1340      Vestibular Assessment   General Observation  Patient relates having sense of walking around in a fog for about 4-5 years.  Reports about 5 years ago had acute outbreak of vertigo that lasted about a day.  Denies any sequelae and does not connect current symptoms to initial vertigo episode.  Reports overall symptoms about 2/10 and mostly constant, though not felt while sleeping.  Does report he had cataract surgery on  L eye and made his vision worse.  Wears glasses, though current ones are wrong prescription so doesnt wear them.  No hearing changes, but some times has pain in L ear.  Had called Dr. Ronette Deter to set up ENT appt, but was unable to make appt.       Symptom Behavior   Type of Dizziness  Comment like walking in a cloud    Frequency of Dizziness  constant    Duration of Dizziness  all the time    Aggravating Factors  Comment states sometimes worse than others, but no aggravating     Relieving Factors  Rest      Occulomotor Exam   Occulomotor Alignment  Normal    Spontaneous  Absent    Gaze-induced  Absent    Head shaking Horizontal  L beating nystagmus    Smooth Pursuits  Intact    Saccades  Intact      Vestibulo-Occular Reflex   VOR 1 Head Only (x 1  viewing)  horizontal and vertical x 30 sec with minor increase in symptoms    VOR to Slow Head Movement  Positive left    VOR Cancellation  Normal      Auditory   Comments  intact and equal bilateral to scratch test         Exercises     Assessment/Plan    PT Assessment All further PT needs can be met in the next venue of care  PT Problem List         PT Treatment Interventions      PT Goals (Current goals can be found in the Care Plan section)  Acute Rehab PT Goals PT Goal Formulation: All assessment and education complete, DC therapy    Frequency     Barriers to discharge        Co-evaluation               AM-PAC PT "6 Clicks" Daily Activity  Outcome Measure Difficulty turning over in bed (including adjusting bedclothes, sheets and blankets)?: None Difficulty moving from lying on back to sitting on the side of the bed? : None Difficulty sitting down on and standing up from a chair with arms (e.g., wheelchair, bedside commode, etc,.)?: A Little Help needed moving to and from a bed to chair (including a wheelchair)?: None Help needed walking in hospital room?: None Help needed climbing 3-5 steps with a railing? : A Little 6 Click Score: 22    End of Session   Activity Tolerance: Patient tolerated treatment well Patient left: with call bell/phone within reach;in bed;with family/visitor present   PT Visit Diagnosis: Other abnormalities of gait and mobility (R26.89);Dizziness and giddiness (R42)    Time: 3220-2542 PT Time Calculation (min) (ACUTE ONLY): 28 min   Charges:   PT Evaluation $PT Eval Moderate Complexity: 1 Mod PT Treatments $Self Care/Home Management: 8-22   PT G CodesMagda Kiel, [place] 01/15/2018   Reginia Naas 01/15/2018, 1:50 PM

## 2018-01-15 NOTE — Progress Notes (Signed)
STROKE TEAM PROGRESS NOTE   HISTORY OF PRESENT ILLNESS (per record) Edward [name] is a 77 y.o. male past medical history of chronic vertigo, hypertension, hyperlipidemia, who was in his usual state of health till about 8 PM on 01/14/2018, when he was watching TV and experienced what he describes as 20 to 30 minutes worth of episode of word finding difficulty and garbled speech. He said that he was watching TV, could not really read the [facility] at the bottom of the screen as it was going through the screen, felt confused and his wife noted that his speech was garbled.  He said he knew what he wanted to say but could not articulate his speech. This episode was similar to what had happened last year, when he was admitted to [facility] for TIA work-up.  The episode this time around was similar in duration or may be slightly shorter in duration but concerning enough to bring him to the hospital. He says that he suffers from what he describes as chronic vertigo.  He describes a sensation of off and on feeling off balance and being in a daze, with separate episodes of room spinning which she has had only a few times in the past few years. He was told that he might have BPPV and was told to do Epley maneuvers himself, which she never completely did. 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 When I asked him if he has ever experienced hearing loss, he said he had not noticed anything more than some difficulty hearing which was age-appropriate. He has never been diagnosed or worked up for Mnire's disease Last admission for TIA, although work-up was unremarkable.  He was on aspirin and was changed to Plavix at that time.  Continues to be compliant to Plavix.  LKW: 8 PM on 01/14/2018 tpa given?: no, symptoms completely resolved Premorbid modified Rankin scale (mRS):  0     SUBJECTIVE (INTERVAL HISTORY) The patient reports that he had a similar TIA like episode last fall. The patient also reports that he occasionally has a rapid heart rate which comes on unexpectedly and lasts several minutes. He is followed by Dr.Tilley and Dr. [place] for cardiology. Dr. Pearlean Brownie recommended an outpatient TEE and loop. The patient also has occasional vertigo-like symptoms.    OBJECTIVE Temp:  [98.5 F (36.9 C)-98.6 F (37 C)] 98.6 F (37 C) (05/31 1610) Pulse Rate:  [54-67] 55 (05/31 9604) Cardiac Rhythm: Normal sinus rhythm (05/30 2249) Resp:  [16-23] 16 (05/31 0632) BP: (127-171)/(55-79) 152/74 (05/31 5409) SpO2:  [97 %-100 %] 100 % (05/31 8119) Weight:  [160 lb (72.6 kg)-160 lb 11.5 oz (72.9 kg)] 160 lb 11.5 oz (72.9 kg) (05/31 1478)  CBC:  Recent Labs  Lab 01/14/18 2203 01/14/18 2208  WBC 5.4  --   NEUTROABS 2.9  --   HGB 12.9* 12.6*  HCT 39.8 37.0*  MCV 94.8  --   PLT 183  --     Basic Metabolic Panel:  Recent Labs  Lab 01/14/18 2203 01/14/18 2208  NA 139 141  K 4.3 4.2  CL 110 107  CO2 23  --   GLUCOSE 117* 117*  BUN 14 15  CREATININE 1.04 0.90  CALCIUM 9.3  --     Lipid Panel:     Component Value Date/Time  CHOL 117 01/15/2018 0453   TRIG 64 01/15/2018 0453   HDL 41 01/15/2018 0453   CHOLHDL 2.9 01/15/2018 0453   VLDL 13 01/15/2018 0453   LDLCALC 63 01/15/2018 0453   HgbA1c:  Lab Results  Component Value Date   HGBA1C 5.5 01/15/2018   Urine Drug Screen:     Component Value Date/Time   LABOPIA NONE DETECTED 10/09/2016 1858   COCAINSCRNUR NONE DETECTED 10/09/2016 1858   LABBENZ NONE DETECTED 10/09/2016 1858   AMPHETMU NONE DETECTED 10/09/2016 1858   THCU NONE DETECTED 10/09/2016 1858   LABBARB NONE DETECTED 10/09/2016 1858    Alcohol Level No results found for: [facility]  IMAGING   Dg Chest 2 View 01/15/2018 IMPRESSION:  Normal chest radiographs.    Ct Head Wo Contrast 01/15/2018 IMPRESSION:  1. No acute  intracranial pathology seen on CT.  2. Mild cortical volume loss and scattered small vessel ischemic microangiopathy.    Mr [facility] Contrast Mr Maxine Glenn Neck W Wo Contrast 01/15/2018 IMPRESSION:  1. No acute intracranial abnormality.  2. Patent carotid and vertebral arteries. No dissection, aneurysm, or hemodynamically significant stenosis utilizing NASCET criteria.  3. Patent anterior and posterior intracranial circulation. No large vessel occlusion, aneurysm, or significant stenosis.  4. Mild for age chronic microvascular ischemic changes and parenchymal volume loss of the brain.      Transthoracic Echocardiogram  01/15/2018 Study Conclusions  - Left ventricle: The cavity size was normal. Systolic function was   normal. The estimated ejection fraction was in the range of 50%   to 55%. Wall motion was normal; there were no regional wall   motion abnormalities. - Aortic valve: AV is trileaflet with some calcification and   thickening Two jets of AR one central and one in periphery   between left and right cusps which is a bit   unusual. There was mild to moderate regurgitation. Valve area   (VTI): 2.32 cm^2. Valve area (Vmax): 2.95 cm^2. Valve area   (Vmean): 2.41 cm^2. - Mitral valve: Moderately calcified annulus. Moderately thickened   leaflets . There was mild regurgitation. Valve area by pressure   half-time: 1.26 cm^2. - Left atrium: The atrium was mildly dilated. - Atrial septum: No defect or patent foramen ovale was identified.     PHYSICAL EXAM Vitals:   01/15/18 0054 01/15/18 0345 01/15/18 0530 01/15/18 0632  BP:  (!) 127/55 136/65 (!) 152/74  Pulse:  62 (!) 59 (!) 55  Resp:  16 (!) 23 16  Temp: 98.5 F (36.9 C)   98.6 F (37 C)  TempSrc:    Oral  SpO2:  98% 99% 100%  Weight:    160 lb 11.5 oz (72.9 kg)  Height:     (1.676 m)   Obese elderly Caucasian male currently not in distress  . Afebrile. Head is nontraumatic. Neck is supple without bruit.     Cardiac exam no murmur or gallop. Lungs are clear to auscultation. Distal pulses are well felt.  Neurological Exam ;  Awake  Alert oriented x 3. Normal speech and language.eye movements full without nystagmus.fundi were not visualized. Vision acuity and fields appear normal. Hearing is normal. Palatal movements are normal. Face symmetric. Tongue midline. Normal strength, tone, reflexes and coordination. Normal sensation. Gait deferred.     ASSESSMENT/PLAN Edward [name] is a 77 y.o. male with history of coronary artery disease with previous stent, hypertension, hyperlipidemia, previous TIA, and occasional vertigo-like symptoms presenting with speech difficulties and confusion. He  did not receive IV t-PA due to resolution of deficits.  Possible left hemisphericTIA:  Possibly embolic  Resultant - resolution of deficits  CT head - no acute abnormality.  MRI head - no acute abnormality.  MRA head unremarkable  Carotid Doppler - MRA neck  2D Echo - as above. No cardiac source of emboli.  LDL - 63  HgbA1c - 5.5  VTE prophylaxis - Lovenox Diet Order           Diet Heart Room service appropriate? Yes; Fluid consistency: Thin  Diet effective now          clopidogrel 75 mg daily prior to admission, now on clopidogrel 75 mg daily  Patient counseled to be compliant with his antithrombotic medications  Ongoing aggressive stroke risk factor management  Therapy recommendations:  Outpatient physical therapy recommended  Disposition:  Pending  Hypertension  Stable . Permissive hypertension (OK if < 220/120) but gradually normalize in 5-7 days . Long-term BP goal normotensive  Hyperlipidemia  Lipid lowering medication PTA: Crestor 10 mg daily  LDL 63, goal < 70  Current lipid lowering medication: Crestor 10 mg daily  Continue statin at discharge   Other Stroke Risk Factors  Advanced age  Former cigarette smoker - quit  ETOH use, advised to drink no more than  1 alcoholic beverage per day.   Hx stroke/TIA  Coronary artery disease   Other Active Problems  Vertigo  Occasional palpitations and rapid heart rate  Mild anemia   Plan / Recommendations   Stroke workup: awaiting - . Stroke workup complete  Therapy Follow Up: outpatient physical therapy recommended  Disposition: pending  Antiplatelet / Anticoagulation: continue Plavix 75 mg daily  Statin: continue Crestor 10 mg daily  MD Follow Up: [facility] in 6-8 weeks  Other: outpatient TEE and loop recommended  Further risk factor modification per primary care MD: Follow Up 2 weeks   Hospital day # 0  Delton See PA-C [facility] Pager [phone] 01/15/2018, 2:35 PM I have personally examined this patient, reviewed notes, independently viewed imaging studies, participated in medical decision making and plan of care.ROS completed by me personally and pertinent positives fully documented  I have made any additions or clarifications directly to the above note. Agree with note above. He presented with transient episode of expressive aphasia and speech difficulties possibly a left hemispheric TIA and states he had somewhat similar episode in the past. Strong suspicion for paroxysmal A. Fib hence recommend checking outpatient TEE and loop recorder if ongoing current workup is unyielding. Recommend dual antiplatelet therapy for 3 weeks followed by Plavix alone. Discussed with patient and Dr. Ashley Royalty. Greater than 50% time during this 35 minute visit was spent on counseling and coordination of care about his TIA and stroke evaluation and treatment and answered questions  Delia Heady, MD Medical Director [facility] Pager: [phone] 01/15/2018 4:41 PM   To contact Stroke Continuity provider, please refer to [email or web address]. After hours, contact General Neurology

## 2018-01-15 NOTE — ED Notes (Signed)
Patient refused 5 am blood draw.

## 2018-01-15 NOTE — Plan of Care (Signed)
TIA signs and symptoms resolving.  Patient is anxious to go home.  Plan of care is to observe and facilitate any further testing.

## 2018-01-15 NOTE — Progress Notes (Signed)
Patient received alert and oriented x 4, patient brought into the unit via stretcher, oriented to his call bell, bathroom and telephone use. Call bell and personal items within reach will continue monitor.

## 2018-01-15 NOTE — Care Management Note (Signed)
Case Management Note  Patient Details  Name: Juanetta BeetsVictor X Juniel MRN: [id number] Date of Birth: 1940/08/24  Subjective/Objective:      Pt in with TIA. He is from home with spouse and IADL.              Action/Plan: CM consulted for outpatient therapy. [facility] Orders in Epic and information on the AVS. Pt has transportation home, PCP and insurance.    Expected Discharge Date:  01/15/18               Expected Discharge Plan:  OP Rehab  In-House Referral:     Discharge planning Services  CM Consult  Post Acute Care Choice:    Choice offered to:     DME Arranged:    DME Agency:     HH Arranged:    HH Agency:     Status of Service:  Completed, signed off  If discussed at [employer] Length of Stay Meetings, dates discussed:    Additional Comments:  Kermit BaloKelli F Tynesia Harral, RN 01/15/2018, 4:06 PM

## 2018-01-15 NOTE — Progress Notes (Signed)
  Echocardiogram 2D Echocardiogram has been performed.  Edward [name] 01/15/2018, 9:39 AM

## 2018-01-15 NOTE — Consult Note (Addendum)
Neurology Consultation  Reason for Consult: tia Referring Physician: Dr. Judd Lienelo  CC: Word finding difficulty, garbled speech-resolved  History is obtained from: Patient, chart  HPI: Edward [name] is a [age] y.o. male past medical history of chronic vertigo, hypertension, hyperlipidemia, who was in his usual state of health till about 8 PM on 01/14/2018, when he was watching TV and experienced what he describes as 20 to 30 minutes worth of episode of word finding difficulty and garbled speech. He said that he was watching TV, could not really read the [facility] at the bottom of the screen as it was going through the screen, felt confused and his wife noted that his speech was garbled.  He said he knew what he wanted to say but could not articulate his speech. This episode was similar to what had happened last year, when he was admitted to [facility] for [facility]IA work-up.  The episode this time around was similar in duration or may be slightly shorter in duration but concerning enough to bring him to the hospital. He says that he suffers from what he describes as chronic vertigo.  He describes a sensation of off and on feeling off balance and being in a daze, with separate episodes of room spinning which she has had only a few times in the past few years. He was told that he might have BPPV and was told to do Epley maneuvers himself, which she never completely did. 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 When I asked him if he has ever experienced hearing loss, he said he had not noticed anything more than some difficulty hearing which was age-appropriate. He has never been diagnosed or worked up for Mnire's disease Last admission for TIA, although work-up was unremarkable.  He was on aspirin and was changed to Plavix at that time.  Continues to be compliant to  Plavix.  LKW: 8 PM on 01/14/2018 tpa given?: no, symptoms completely resolved Premorbid modified Rankin scale (mRS): 0  ROS:  ROS was performed and is negative except as noted in the HPI.  Past Medical History:  Diagnosis Date  . Arthritis   . At risk for sleep apnea    STOP-BANG = 4    SENT TO PCP 02-10-215  . BPH (benign prostatic hypertrophy)   . Coronary artery disease CARIOLOGIST-- DR Donnie AhoILLEY   ANGIOPLASTY TO LAD  . Elevated PSA   . Frequency of urination   . History of kidney stones   . Hyperlipidemia   . Hypertension   . Nocturia   . Spermatocele    left  . Urgency of urination   . Vertigo    No family history on file.   Social History:   reports that he quit smoking about 47 years ago. His smoking use included cigarettes. He has a 1.50 pack-year smoking history. He has never used smokeless tobacco. He reports that he drinks alcohol. He reports that he does not use drugs.  Medications  Current Facility-Administered Medications:  .  sodium chloride 0.9 % bolus 1,000 mL, 1,000 mL, Intravenous, Once, Milon DikesArora, [employer], MD  Current Outpatient Medications:  .  amLODipine (NORVASC) 10 MG tablet, Take 5 mg by mouth daily. , Disp: , Rfl:  .  atenolol (TENORMIN) 50 MG tablet, Take 25 mg by mouth daily. , Disp: ,  Rfl:  .  clopidogrel (PLAVIX) 75 MG tablet, Take 1 tablet (75 mg total) by mouth daily., Disp: 30 tablet, Rfl: 0 .  Coenzyme Q10 (CO Q 10 PO), Take 1 capsule by mouth daily., Disp: , Rfl:  .  losartan (COZAAR) 100 MG tablet, Take 50 mg by mouth 2 (two) times daily., Disp: , Rfl:  .  rosuvastatin (CRESTOR) 20 MG tablet, Take 0.5 tablets (10 mg total) by mouth every evening., Disp: 30 tablet, Rfl: 0  Exam: Current vital signs: BP (!) 147/79   Pulse (!) 55   Temp 98.5 F (36.9 C) (Oral)   Resp (!) 22   Ht 5\' 6"  (1.676 m)   Wt 72.6 kg (160 lb)   SpO2 98%   BMI 25.82 kg/m  Vital signs in last 24 hours: Temp:  [98.5 F (36.9 C)] 98.5 F (36.9 C) (05/30  2129) Pulse Rate:  [55-67] 55 (05/30 2330) Resp:  [18-22] 22 (05/30 2330) BP: (147-171)/(78-79) 147/79 (05/30 2330) SpO2:  [98 %] 98 % (05/30 2330) Weight:  [72.6 kg (160 lb)] 72.6 kg (160 lb) (05/30 2149)  GENERAL: Awake, alert in NAD HEENT: - Normocephalic and atraumatic, dry mm, no LN++, no Thyromegally LUNGS - Clear to auscultation bilaterally with no wheezes CV - S1S2 RRR, no m/r/g, equal pulses bilaterally. ABDOMEN - Soft, nontender, nondistended with normoactive BS Ext: warm, well perfused, intact peripheral pulses, no edema  NEURO:  Mental Status: AA&Ox3  Language: speech is clear.  Naming, repetition, fluency, and comprehension intact. Cranial Nerves: Equal round react light,. EOMI with subtle nystagmus on rightward gaze, visual fields full, no facial asymmetry, facial sensation intact, hearing intact, tongue/uvula/soft palate midline, normal sternocleidomastoid and trapezius muscle strength. No evidence of tongue atrophy or fibrillations Motor: 5/5 in all 4 extremities.  No vertical drift Tone: is normal and bulk is normal Sensation- Intact to light touch bilaterall, no extinction Coordination: FTN intact bilaterally, no ataxia in BLE. Gait- deferred  NIHSS-0   Labs I have reviewed labs in epic and the results pertinent to this consultation are:  CBC    Component Value Date/Time   WBC 5.4 01/14/2018 2203   RBC 4.20 (L) 01/14/2018 2203   HGB 12.6 (L) 01/14/2018 2208   HCT 37.0 (L) 01/14/2018 2208   PLT 183 01/14/2018 2203   MCV 94.8 01/14/2018 2203   MCH 30.7 01/14/2018 2203   MCHC 32.4 01/14/2018 2203   RDW 12.6 01/14/2018 2203   LYMPHSABS 1.3 01/14/2018 2203   MONOABS 0.6 01/14/2018 2203   EOSABS 0.4 01/14/2018 2203   BASOSABS 0.1 01/14/2018 2203    CMP     Component Value Date/Time   NA 141 01/14/2018 2208   K 4.2 01/14/2018 2208   CL 107 01/14/2018 2208   CO2 23 01/14/2018 2203   GLUCOSE 117 (H) 01/14/2018 2208   BUN 15 01/14/2018 2208    CREATININE 0.90 01/14/2018 2208   CALCIUM 9.3 01/14/2018 2203   PROT 6.8 01/14/2018 2203   ALBUMIN 3.8 01/14/2018 2203   AST 26 01/14/2018 2203   ALT 21 01/14/2018 2203   ALKPHOS 72 01/14/2018 2203   BILITOT 0.7 01/14/2018 2203   GFRNONAA >60 01/14/2018 2203   GFRAA >60 01/14/2018 2203    Lipid Panel     Component Value Date/Time   CHOL 137 10/10/2016 0241   TRIG 72 10/10/2016 0241   HDL 40 (L) 10/10/2016 0241   CHOLHDL 3.4 10/10/2016 0241   VLDL 14 10/10/2016 0241   LDLCALC 83 10/10/2016  1610     Imaging I have reviewed the images obtained: CT-scan of the brain-no acute changes  Assessment:  77 year old man past history of chronic vertigo, hypertension, hyperlipidemia presenting for evaluation of 30-minute episode of word finding difficulty and garbled speech. Has had a similar episode last year, diagnosed with a left hemispheric TIA. Currently on Plavix for stroke prevention. Today's episode concerning for a possible TIA again. On a separate note, also has history of what he describes as chronic vertigo, but the vertigo component is only a small part of his description of symptoms.  He is also had horrible tinnitus off and on.  No formal hearing testing has been done to date. I think Mnire's disease should be on the differential and be considered as an outpatient.  Impression: Evaluate for stroke/TIA Needs outpatient evaluation with audiology, neurology and ENT for Mnire's disease  Recommendations: Admit for stroke/TIA risk factor work-up under hospitalist service Maintain on telemetry Frequent neurochecks Continue with home Plavix for now Atorvastatin 80 daily Allow for permissive hypertension  MRI brain without contrast MRA head without contrast MRA neck with contrast 2D echocardiogram Hemoglobin A1c Fasting lipid panel Physical therapy Occupational Therapy Speech therapy N.p.o. until passes bedside swallow or formal swallow evaluation by  speech-language pathology  Stroke neurology will follow with you.  Will need outpatient evaluation for possible Mnire's disease.  Stroke team will follow the patient with you.  Please page the stroke MD/NP/PA from the on-call list on [email or web address]  -- Milon Dikes, MD [facility] Pager: [phone] If 7pm to 7am, please call on call as listed on AMION.

## 2018-01-15 NOTE — Evaluation (Signed)
Occupational Therapy Evaluation Patient Details Name: Edward [name] MRN: [id number] DOB: [birth year] Today's Date: 01/15/2018    History of Present Illness 77 y.o. male with medical history significant of HTN, HLD, prior TIA.  Patient presents to the ED today for evaluation of aphasia. Pt does report report having constant "dizziness". States that this is been ongoing for a number of years.   Clinical Impression   This 77 y/o male presents with the above. At baseline pt reports independence with ADLs, iADLs and functional mobility, lives with spouse. Pt reports chronic issues with dizziness, describing it as "being in a haze" this session. Pt demonstrates room and hallway functional mobility, standing grooming ADLs without AD and supervision throughout, completing tub transfer with minguard assist. Pt completing all tasks with no overt LOB noted, during visual assessment pt does initially demonstrate notable beating/jump to the L when completing horizontal tracking to the R, though only noted once and unable to replicate with additional horizontal tracking. Overall feel pt is at his baseline regarding ADL and mobility completion. Feel pt is safe to return home once medically ready from OT standpoint. Do not anticipate pt will require follow up OT services; acute OT to sign off at this time, referral is appreciated.    Follow Up Recommendations  No OT follow up;Supervision - Intermittent    Equipment Recommendations  None recommended by OT           Precautions / Restrictions Precautions Precautions: None Restrictions Weight Bearing Restrictions: No      Mobility Bed Mobility Overal bed mobility: Modified Independent                Transfers Overall transfer level: Modified independent Equipment used: None             General transfer comment: no physical assist needed     Balance Overall balance assessment: Mild deficits observed, not formally tested                                          ADL either performed or assessed with clinical judgement   ADL Overall ADL's : At baseline                                       General ADL Comments: Pt performing functional mobility without AD, standing grooming ADLs at sink with supervision throughout, completing tub transfer with minguard assist. Pt reports baseline balance deficits, though appears to be higher level balance deficits as pt with no overt LOB during functional tasks this session; had pt complete higher level cognitive challenges during mobility (i.e. counting backwards from 100 by 3's), pt with some difficulties completing requiring increased time for accuracy, though cognition overall appearing WFL      Vision Baseline Vision/History: Cataracts(reports vision is worse after recent L cataract surgery ) Patient Visual Report: No change from baseline Vision Assessment?: Yes Eye Alignment: Within Functional Limits Saccades: Additional eye shifts occurred during testing Visual Fields: No apparent deficits Additional Comments: during horizontal tracking noted pt eyes jump/beat left when initially tracking to the R, occured during first time tracking towards R visual field, did not occur thereafter during additional horizontal tracking                 Pertinent Vitals/Pain Pain Assessment: No/denies  pain     Hand Dominance Right   Extremity/Trunk Assessment Upper Extremity Assessment Upper Extremity Assessment: Overall WFL for tasks assessed   Lower Extremity Assessment Lower Extremity Assessment: Defer to PT evaluation   Cervical / Trunk Assessment Cervical / Trunk Assessment: Normal   Communication Communication Communication: No difficulties   Cognition Arousal/Alertness: Awake/alert Behavior During Therapy: WFL for tasks assessed/performed Overall Cognitive Status: Within Functional Limits for tasks assessed                                                       Home Living Family/patient expects to be discharged to:: Private residence Living Arrangements: Spouse/significant other Available Help at Discharge: Family Type of Home: House       Home Layout: Two level Alternate Level Stairs-Number of Steps: flight   Bathroom Shower/Tub: Chief Strategy OfficerTub/shower unit   Bathroom Toilet: Standard                Prior Functioning/Environment Level of Independence: Independent        Comments: drives, completes iADLs         OT Problem List: Impaired balance (sitting and/or standing);Other (comment)(dizziness )      OT Treatment/Interventions:      OT Goals(Current goals can be found in the care plan section) Acute Rehab OT Goals Patient Stated Goal: return home hopefully today  OT Goal Formulation: All assessment and education complete, DC therapy                                 AM-PAC PT "6 Clicks" Daily Activity     Outcome Measure Help from another person eating meals?: None Help from another person taking care of personal grooming?: None Help from another person toileting, which includes using toliet, bedpan, or urinal?: None Help from another person bathing (including washing, rinsing, drying)?: None Help from another person to put on and taking off regular upper body clothing?: None Help from another person to put on and taking off regular lower body clothing?: None 6 Click Score: 24   End of Session Nurse Communication: Mobility status  Activity Tolerance: Patient tolerated treatment well Patient left: with call bell/phone within reach;Other (comment)(sitting EOB )  OT Visit Diagnosis: Other symptoms and signs involving the nervous system (Z61.096(R29.898)                Time: 0454-0981: 0825-0843 OT Time Calculation (min): 18 min Charges:  OT General Charges $OT Visit: 1 Visit OT Evaluation $OT Eval Low Complexity: 1 Low G-Codes:     Edward [name], OT Pager  [phone] 01/15/2018   Edward [name] 01/15/2018, 9:17 AM

## 2018-01-15 NOTE — Discharge Summary (Signed)
Physician Discharge Summary  Edward [name] NFA:[id number] DOB: [birth year] DOA: 01/14/2018  PCP: [name], MD  Admit date: 01/14/2018 Discharge date: 01/15/2018  Admitted From: Home  Disposition: Home Recommendations for Outpatient Follow-up:  1. Follow up with PCP in 1-2 weeks 2. Please obtain BMP/CBC in one week 3. Follow-up with neuro Dr. Pearlean Brownie as  an outpatient 4. Follow-up with Dr. Donnie Aho cardiology as an outpatient for TEE and loop recorder 5. Follow-up with ENT Dr. Dorma Russell for vertigo  Home Health: None Equipment/Devices none  Discharge Condition: Stable Diet recommendation: Cardiac Brief/Interim Summary:77 y.o. male with medical history significant of HTN, HLD, prior TIA.  Patient presents to the ED today for evaluation of aphasia.  Patient states that approximately 8:00 this evening patient was having difficulties pronouncing words and finding the words to say. The wife reports that this lasted for approximately 5 to 10 minutes and then self resolved.  He does report having constant "dizziness". States that this is been ongoing for 5 years.  Admitted around this time last year for TIA work up, work up was unremarkable at that time.    Discharge Diagnoses:  Principal Problem:   TIA (transient ischemic attack) Active Problems:   Hypercholesterolemia   Hypertension  1]TIA-patient was admitted with a diagnosis of TIA.  His symptoms had resolved in few minutes.  His work-up included normal chest x-ray, CT head no acute intracranial pathology, MR a of the head and neck showed no acute intracranial abnormality, transthoracic echo showed ejection fraction 50 to 55%, wall motion normal no region warm regional wall motion abnormalities.  Trileaflet aortic valve some calcification noted.  Was seen in consultation by neurology who thought this could be an embolic event.  They recommended.  Patient to be discharged on aspirin and Plavix for 21 days and then continue Plavix for  the rest of his life.  His LDL was 63 hemoglobin A1c was 5.5.  Patient was given a prescription for outpatient physical therapy.  Neurology also recommended outpatient TEE and loop recorder which will be arranged by Trish with Dr. York Spaniel office.  I have text message Rosann Auerbach and she is arranging this.  He will continue on Crestor 20 mg for his hyperlipidemia.  2] hypertension his blood pressure has been soft to normal here.  I will restart his Cozaar and DC his beta-blocker due to some bradycardia and complaints of ongoing dizziness for many years and about to pass out at home.  Continue Norvasc.  Discharge Instructions  Discharge Instructions    Call MD for:  difficulty breathing, headache or visual disturbances   Complete by:  As directed    Call MD for:  persistant dizziness or light-headedness   Complete by:  As directed    Call MD for:  persistant nausea and vomiting   Complete by:  As directed    Call MD for:  severe uncontrolled pain   Complete by:  As directed    Call MD for:  temperature >100.4   Complete by:  As directed    Diet - low sodium heart healthy   Complete by:  As directed    Increase activity slowly   Complete by:  As directed      Allergies as of 01/15/2018   No Known Allergies     Medication List    STOP taking these medications   atenolol 50 MG tablet Commonly known as:  TENORMIN     TAKE these medications   amLODipine 10 MG tablet Commonly  known as:  NORVASC Take 5 mg by mouth daily.   clopidogrel 75 MG tablet Commonly known as:  PLAVIX Take 1 tablet (75 mg total) by mouth daily.   CO Q 10 PO Take 1 capsule by mouth daily.   losartan 100 MG tablet Commonly known as:  COZAAR Take 50 mg by mouth 2 (two) times daily.   rosuvastatin 20 MG tablet Commonly known as:  CRESTOR Take 0.5 tablets (10 mg total) by mouth every evening.      Follow-up Information    Avva, Ravisankar, MD Follow up.   Specialty:  Internal Medicine Contact  information: [address] [place] [address] [phone]        Micki RileySethi, Pramod S, MD Follow up.   Specialties:  Neurology, Radiology Contact information: [address] [place] [address] [phone]          No Known Allergies  Consultations: neuro Procedures/Studies: Dg Chest 2 View  Result Date: 01/15/2018 CLINICAL DATA:  TIA EXAM: CHEST - 2 VIEW COMPARISON:  11/15/2015 FINDINGS: Lungs are clear.  No pleural effusion or pneumothorax. The heart is normal in size. Mild degenerative changes of the visualized thoracolumbar spine. IMPRESSION: Normal chest radiographs. Electronically Signed   By: Charline BillsSriyesh  Krishnan M.D.   On: 01/15/2018 01:41   Ct Head Wo Contrast  Result Date: 01/15/2018 CLINICAL DATA:  Acute onset of speech difficulties. Vertigo. EXAM: CT HEAD WITHOUT CONTRAST TECHNIQUE: Contiguous axial images were obtained from the base of the skull through the vertex without intravenous contrast. COMPARISON:  MRI of the brain performed 10/09/2016 FINDINGS: Brain: No evidence of acute infarction, hemorrhage, hydrocephalus, extra-axial collection or mass lesion / mass effect. Prominence of the ventricles and sulci reflects mild cortical volume loss. Mild periventricular and subcortical matter change likely reflects small vessel ischemic microangiopathy. The brainstem and fourth ventricle are within normal limits. The basal ganglia are unremarkable in appearance. The cerebral hemispheres demonstrate grossly normal gray-white differentiation. No mass effect or midline shift is seen. Vascular: No hyperdense vessel or unexpected calcification. Skull: There is no evidence of fracture; visualized osseous structures are unremarkable in appearance. Sinuses/Orbits: The orbits are within normal limits. The paranasal sinuses and mastoid air cells are well-aerated. Other: No significant soft tissue abnormalities are seen. IMPRESSION: 1. No acute intracranial pathology seen on  CT. 2. Mild cortical volume loss and scattered small vessel ischemic microangiopathy. Electronically Signed   By: Roanna RaiderJeffery  Chang M.D.   On: 01/15/2018 00:41   Mr Shirlee LatchMra Head XLWo Contrast  Result Date: 01/15/2018 CLINICAL DATA:  77 y/o  M; episode of aphasia. EXAM: MRI HEAD WITHOUT CONTRAST MRA HEAD WITHOUT CONTRAST MRA NECK WITHOUT AND WITH CONTRAST TECHNIQUE: Multiplanar, multiecho pulse sequences of the brain and surrounding structures were obtained without intravenous contrast. Angiographic images of the Circle of Willis were obtained using MRA technique without intravenous contrast. Angiographic images of the neck were obtained using MRA technique without and with intravenous contrast. Carotid stenosis measurements (when applicable) are obtained utilizing NASCET criteria, using the distal internal carotid diameter as the denominator. COMPARISON:  01/15/2018 CT head. CONTRAST:  15 cc MultiHance FINDINGS: MRI HEAD FINDINGS Brain: No acute infarction, hemorrhage, hydrocephalus, extra-axial collection or mass lesion. Several nonspecific foci of T2 FLAIR hyperintense signal abnormality in subcortical and periventricular white matter are compatible with mild chronic microvascular ischemic changes for age. Mild brain parenchymal volume loss. Vascular: Normal flow voids. Skull and upper cervical spine: Normal marrow signal. Sinuses/Orbits: Mild diffuse paranasal sinus mucosal thickening with  mucous retention cysts in the maxillary sinuses. Trace left mastoid effusion. Left intra-ocular lens replacement. Other: None. MRA HEAD FINDINGS Internal carotid arteries:  Patent. Anterior cerebral arteries: Patent. Azygos proximal ACA distribution arising from right ACA, normal variant. Middle cerebral arteries: Patent. Anterior communicating artery: Patent. Posterior communicating arteries:  Patent.  Fetal left PCA. Posterior cerebral arteries:  Patent. Basilar artery:  Patent. Vertebral arteries:  Patent. No evidence of  high-grade stenosis, large vessel occlusion, or aneurysm. MRA NECK FINDINGS Aortic arch: Patent. Right common carotid artery: Patent. Right internal carotid artery: Patent. Right vertebral artery: Patent. Left common carotid artery: Patent. Left Internal carotid artery: Patent. Left Vertebral artery: Patent. There is no evidence of hemodynamically significant stenosis by NASCET criteria, occlusion, or aneurysm. IMPRESSION: 1. No acute intracranial abnormality. 2. Patent carotid and vertebral arteries. No dissection, aneurysm, or hemodynamically significant stenosis utilizing NASCET criteria. 3. Patent anterior and posterior intracranial circulation. No large vessel occlusion, aneurysm, or significant stenosis. 4. Mild for age chronic microvascular ischemic changes and parenchymal volume loss of the brain. Electronically Signed   By: Mitzi Hansen M.D.   On: 01/15/2018 03:12   Mr Maxine Glenn Neck W Wo Contrast  Result Date: 01/15/2018 CLINICAL DATA:  77 y/o  M; episode of aphasia. EXAM: MRI HEAD WITHOUT CONTRAST MRA HEAD WITHOUT CONTRAST MRA NECK WITHOUT AND WITH CONTRAST TECHNIQUE: Multiplanar, multiecho pulse sequences of the brain and surrounding structures were obtained without intravenous contrast. Angiographic images of the Circle of Willis were obtained using MRA technique without intravenous contrast. Angiographic images of the neck were obtained using MRA technique without and with intravenous contrast. Carotid stenosis measurements (when applicable) are obtained utilizing NASCET criteria, using the distal internal carotid diameter as the denominator. COMPARISON:  01/15/2018 CT head. CONTRAST:  15 cc MultiHance FINDINGS: MRI HEAD FINDINGS Brain: No acute infarction, hemorrhage, hydrocephalus, extra-axial collection or mass lesion. Several nonspecific foci of T2 FLAIR hyperintense signal abnormality in subcortical and periventricular white matter are compatible with mild chronic microvascular ischemic  changes for age. Mild brain parenchymal volume loss. Vascular: Normal flow voids. Skull and upper cervical spine: Normal marrow signal. Sinuses/Orbits: Mild diffuse paranasal sinus mucosal thickening with mucous retention cysts in the maxillary sinuses. Trace left mastoid effusion. Left intra-ocular lens replacement. Other: None. MRA HEAD FINDINGS Internal carotid arteries:  Patent. Anterior cerebral arteries: Patent. Azygos proximal ACA distribution arising from right ACA, normal variant. Middle cerebral arteries: Patent. Anterior communicating artery: Patent. Posterior communicating arteries:  Patent.  Fetal left PCA. Posterior cerebral arteries:  Patent. Basilar artery:  Patent. Vertebral arteries:  Patent. No evidence of high-grade stenosis, large vessel occlusion, or aneurysm. MRA NECK FINDINGS Aortic arch: Patent. Right common carotid artery: Patent. Right internal carotid artery: Patent. Right vertebral artery: Patent. Left common carotid artery: Patent. Left Internal carotid artery: Patent. Left Vertebral artery: Patent. There is no evidence of hemodynamically significant stenosis by NASCET criteria, occlusion, or aneurysm. IMPRESSION: 1. No acute intracranial abnormality. 2. Patent carotid and vertebral arteries. No dissection, aneurysm, or hemodynamically significant stenosis utilizing NASCET criteria. 3. Patent anterior and posterior intracranial circulation. No large vessel occlusion, aneurysm, or significant stenosis. 4. Mild for age chronic microvascular ischemic changes and parenchymal volume loss of the brain. Electronically Signed   By: Mitzi Hansen M.D.   On: 01/15/2018 03:12   Mr Brain Wo Contrast  Result Date: 01/15/2018 CLINICAL DATA:  77 y/o  M; episode of aphasia. EXAM: MRI HEAD WITHOUT CONTRAST MRA HEAD WITHOUT CONTRAST MRA NECK WITHOUT AND WITH CONTRAST  TECHNIQUE: Multiplanar, multiecho pulse sequences of the brain and surrounding structures were obtained without intravenous  contrast. Angiographic images of the Circle of Willis were obtained using MRA technique without intravenous contrast. Angiographic images of the neck were obtained using MRA technique without and with intravenous contrast. Carotid stenosis measurements (when applicable) are obtained utilizing NASCET criteria, using the distal internal carotid diameter as the denominator. COMPARISON:  01/15/2018 CT head. CONTRAST:  15 cc MultiHance FINDINGS: MRI HEAD FINDINGS Brain: No acute infarction, hemorrhage, hydrocephalus, extra-axial collection or mass lesion. Several nonspecific foci of T2 FLAIR hyperintense signal abnormality in subcortical and periventricular white matter are compatible with mild chronic microvascular ischemic changes for age. Mild brain parenchymal volume loss. Vascular: Normal flow voids. Skull and upper cervical spine: Normal marrow signal. Sinuses/Orbits: Mild diffuse paranasal sinus mucosal thickening with mucous retention cysts in the maxillary sinuses. Trace left mastoid effusion. Left intra-ocular lens replacement. Other: None. MRA HEAD FINDINGS Internal carotid arteries:  Patent. Anterior cerebral arteries: Patent. Azygos proximal ACA distribution arising from right ACA, normal variant. Middle cerebral arteries: Patent. Anterior communicating artery: Patent. Posterior communicating arteries:  Patent.  Fetal left PCA. Posterior cerebral arteries:  Patent. Basilar artery:  Patent. Vertebral arteries:  Patent. No evidence of high-grade stenosis, large vessel occlusion, or aneurysm. MRA NECK FINDINGS Aortic arch: Patent. Right common carotid artery: Patent. Right internal carotid artery: Patent. Right vertebral artery: Patent. Left common carotid artery: Patent. Left Internal carotid artery: Patent. Left Vertebral artery: Patent. There is no evidence of hemodynamically significant stenosis by NASCET criteria, occlusion, or aneurysm. IMPRESSION: 1. No acute intracranial abnormality. 2. Patent carotid  and vertebral arteries. No dissection, aneurysm, or hemodynamically significant stenosis utilizing NASCET criteria. 3. Patent anterior and posterior intracranial circulation. No large vessel occlusion, aneurysm, or significant stenosis. 4. Mild for age chronic microvascular ischemic changes and parenchymal volume loss of the brain. Electronically Signed   By: Mitzi Hansen M.D.   On: 01/15/2018 03:12    (Echo, Carotid, EGD, Colonoscopy, ERCP)    Subjective:   Discharge Exam: Vitals:   01/15/18 0830 01/15/18 1300  BP: (!) 142/74 124/69  Pulse: 61 60  Resp: 16 18  Temp: 98.1 F (36.7 C) 97.9 F (36.6 C)  SpO2: 100% 100%   Vitals:   01/15/18 0530 01/15/18 0632 01/15/18 0830 01/15/18 1300  BP: 136/65 (!) 152/74 (!) 142/74 124/69  Pulse: (!) 59 (!) 55 61 60  Resp: (!) 23 16 16 18   Temp:  98.6 F (37 C) 98.1 F (36.7 C) 97.9 F (36.6 C)  TempSrc:  Oral Oral Oral  SpO2: 99% 100% 100% 100%  Weight:  72.9 kg (160 lb 11.5 oz)    Height:  5\' 6"  (1.676 m)      General: Pt is alert, awake, not in acute distress Cardiovascular: RRR, S1/S2 +, no rubs, no gallops Respiratory: CTA bilaterally, no wheezing, no rhonchi Abdominal: Soft, NT, ND, bowel sounds + Extremities: no edema, no cyanosis    The results of significant diagnostics from this hospitalization (including imaging, microbiology, ancillary and laboratory) are listed below for reference.     Microbiology: No results found for this or any previous visit (from the past 240 hour(s)).   Labs: BNP (last 3 results) No results for input(s): BNP in the last 8760 hours. Basic Metabolic Panel: Recent Labs  Lab 01/14/18 2203 01/14/18 2208  NA 139 141  K 4.3 4.2  CL 110 107  CO2 23  --   GLUCOSE 117* 117*  BUN  14 15  CREATININE 1.04 0.90  CALCIUM 9.3  --    Liver Function Tests: Recent Labs  Lab 01/14/18 2203  AST 26  ALT 21  ALKPHOS 72  BILITOT 0.7  PROT 6.8  ALBUMIN 3.8   No results for input(s):  LIPASE, AMYLASE in the last 168 hours. No results for input(s): AMMONIA in the last 168 hours. CBC: Recent Labs  Lab 01/14/18 2203 01/14/18 2208  WBC 5.4  --   NEUTROABS 2.9  --   HGB 12.9* 12.6*  HCT 39.8 37.0*  MCV 94.8  --   PLT 183  --    Cardiac Enzymes: No results for input(s): CKTOTAL, CKMB, CKMBINDEX, TROPONINI in the last 168 hours. BNP: Invalid input(s): POCBNP CBG: No results for input(s): GLUCAP in the last 168 hours. D-Dimer No results for input(s): DDIMER in the last 72 hours. Hgb A1c Recent Labs    01/15/18 0453  HGBA1C 5.5   Lipid Profile Recent Labs    01/15/18 0453  CHOL 117  HDL 41  LDLCALC 63  TRIG 64  CHOLHDL 2.9   Thyroid function studies No results for input(s): TSH, T4TOTAL, T3FREE, THYROIDAB in the last 72 hours.  Invalid input(s): FREET3 Anemia work up No results for input(s): VITAMINB12, FOLATE, FERRITIN, TIBC, IRON, RETICCTPCT in the last 72 hours. Urinalysis    Component Value Date/Time   COLORURINE YELLOW 10/09/2016 1858   APPEARANCEUR CLEAR 10/09/2016 1858   LABSPEC 1.017 10/09/2016 1858   PHURINE 6.0 10/09/2016 1858   GLUCOSEU NEGATIVE 10/09/2016 1858   HGBUR NEGATIVE 10/09/2016 1858   BILIRUBINUR NEGATIVE 10/09/2016 1858   KETONESUR NEGATIVE 10/09/2016 1858   PROTEINUR NEGATIVE 10/09/2016 1858   UROBILINOGEN 1.0 12/10/2014 2305   NITRITE NEGATIVE 10/09/2016 1858   LEUKOCYTESUR NEGATIVE 10/09/2016 1858   Sepsis Labs Invalid input(s): PROCALCITONIN,  WBC,  LACTICIDVEN Microbiology No results found for this or any previous visit (from the past 240 hour(s)).   Time coordinating discharge:40 minutes  SIGNED:   Alwyn Ren, MD  [facility] 01/15/2018, 2:45 PM Pager   If 7PM-7AM, please contact night-coverage [email or web address] Password [email or web address]

## 2018-01-15 NOTE — H&P (Signed)
History and Physical    Edward BeetsVictor X [name] ZOX:[id number] DOB: 1941/01/06 DOA: 01/14/2018  PCP: Chilton GreathouseAvva, Ravisankar, MD  Patient coming from: Home  I have personally briefly reviewed patient's old medical [facility]  Chief Complaint: Aphasia  HPI: Edward BeetsVictor X [name] is a 77 y.o. male with medical history significant of HTN, HLD, prior TIA.  Patient presents to the ED today for evaluation of aphasia.  Patient states that approximately 8:00 this evening patient was having difficulties pronouncing words and finding the words to say.  The wife reports that this lasted for approximately 5 to 10 minutes and then self resolved.  He does report having constant "dizziness".  States that this is been ongoing for 5 years.  Admitted around this time last year for TIA work up, work up was unremarkable at that time.   ED Course: CT head neg.  Symptoms remain resolved.  Neuro consulted and hospitalist asked to admit for TIA work up.   Review of Systems: As per HPI otherwise 10 point review of systems negative.   Past Medical History:  Diagnosis Date  . Arthritis   . At risk for sleep apnea    STOP-BANG = 4    SENT TO PCP 02-10-215  . BPH (benign prostatic hypertrophy)   . Coronary artery disease CARIOLOGIST-- DR Donnie AhoILLEY   ANGIOPLASTY TO LAD  . Elevated PSA   . Frequency of urination   . History of kidney stones   . Hyperlipidemia   . Hypertension   . Nocturia   . Spermatocele    left  . Urgency of urination   . Vertigo     Past Surgical History:  Procedure Laterality Date  . CORONARY ANGIOPLASTY  1995  DR [place]   LAD  . CYSTOSCOPY N/A 09/18/2015   Procedure: CYSTOSCOPY;  Surgeon: Jerilee FieldMatthew Eskridge, MD;  Location: [facility];  Service: Urology;  Laterality: N/A;  . CYSTOSCOPY WITH RETROGRADE PYELOGRAM, URETEROSCOPY AND STENT PLACEMENT Left 03/01/2013   Procedure: CYSTOSCOPY WITH LEFT RETROGRADE PYELOGRAM, LEFT URETEROSCOPY and stent PLACEMENT;  Surgeon: Antony HasteMatthew Ramsey Eskridge,  MD;  [facility];  Service: Urology;  Laterality: Left;  . FOOT FUSION Left    SECONDARY TO FX'S  . GREEN LIGHT LASER TURP (TRANSURETHRAL RESECTION OF PROSTATE N/A 10/04/2013   Procedure: GREEN LIGHT LASER TURP (TRANSURETHRAL RESECTION OF PROSTATE;  Surgeon: Antony HasteMatthew Ramsey Eskridge, MD;  Location: [facility];  Service: Urology;  Laterality: N/A;  . INGUINAL HERNIA REPAIR Bilateral   . SHOULDER ARTHROSCOPY Right 2004  . SHOULDER ARTHROSCOPY WITH DEBRIDEMENT AND BICEP TENDON REPAIR Left 04-28-2011  . SPERMATOCELECTOMY Left 10/04/2013   Procedure: LEFT SPERMATOCELECTOMY;  Surgeon: Antony HasteMatthew Ramsey Eskridge, MD;  Location[facility];  Service: Urology;  Laterality: Left;  . TRANSURETHRAL RESECTION OF PROSTATE N/A 12/11/2014   Procedure: CYSTOSCOPY, CLOT EVACUATION, WITH FULGURATION ;  Surgeon: Bjorn PippinJohn Wrenn, MD;  Location: [facility];  Service: Urology;  Laterality: N/A;  . TRANSURETHRAL RESECTION OF PROSTATE N/A 09/18/2015   Procedure: TRANSURETHRAL RESECTION OF THE PROSTATE (TURP);  Surgeon: Jerilee FieldMatthew Eskridge, MD;  Location: [facility];  Service: Urology;  Laterality: N/A;  . UMBILICAL HERNIA REPAIR  06-02-2008     reports that he quit smoking about 47 years ago. His smoking use included cigarettes. He has a 1.50 pack-year smoking history. He has never used smokeless tobacco. He reports that he drinks alcohol. He reports that he does not use drugs.  No Known Allergies  No family history on file.  Prior to Admission medications   Medication Sig Start Date End Date Taking? Authorizing Provider  amLODipine (NORVASC) 10 MG tablet Take 5 mg by mouth daily.    Yes   atenolol (TENORMIN) 50 MG tablet Take 25 mg by mouth daily.    Yes [provider]  clopidogrel (PLAVIX) 75 MG tablet Take 1 tablet (75 mg total) by mouth daily. 10/11/16  Yes Leroy Sea, MD  Coenzyme Q10 (CO Q 10 PO) Take 1 capsule by mouth daily.   Yes [provider]  losartan (COZAAR) 100 MG tablet Take  50 mg by mouth 2 (two) times daily.   Yes [provider]  rosuvastatin (CRESTOR) 20 MG tablet Take 0.5 tablets (10 mg total) by mouth every evening. 10/10/16  Yes Leroy Sea, MD    Physical Exam: Vitals:   01/14/18 2129 01/14/18 2149 01/14/18 2245 01/14/18 2330  BP: (!) 171/79  (!) 159/78 (!) 147/79  Pulse: 67  (!) 58 (!) 55  Resp: 18  18 (!) 22  Temp: 98.5 F (36.9 C)     TempSrc: Oral     SpO2: 98%  98% 98%  Weight: 72.6 kg (160 lb) 72.6 kg (160 lb)    Height: 5\' 6"  (1.676 m) 5\' 6"  (1.676 m)      Constitutional: NAD, calm, comfortable Eyes: PERRL, lids and conjunctivae normal ENMT: Mucous membranes are moist. Posterior pharynx clear of any exudate or lesions.Normal dentition.  Neck: normal, supple, no masses, no thyromegaly Respiratory: clear to auscultation bilaterally, no wheezing, no crackles. Normal respiratory effort. No accessory muscle use.  Cardiovascular: Regular rate and rhythm, no murmurs / rubs / gallops. No extremity edema. 2+ pedal pulses. No carotid bruits.  Abdomen: no tenderness, no masses palpated. No hepatosplenomegaly. Bowel sounds positive.  Musculoskeletal: no clubbing / cyanosis. No joint deformity upper and lower extremities. Good ROM, no contractures. Normal muscle tone.  Skin: no rashes, lesions, ulcers. No induration Neurologic: CN 2-12 grossly intact. Sensation intact, DTR normal. Strength 5/5 in all 4.  Psychiatric: Normal judgment and insight. Alert and oriented x 3. Normal mood.    Labs on Admission: I have personally reviewed following labs and imaging studies  CBC: Recent Labs  Lab 01/14/18 2203 01/14/18 2208  WBC 5.4  --   NEUTROABS 2.9  --   HGB 12.9* 12.6*  HCT 39.8 37.0*  MCV 94.8  --   PLT 183  --    Basic Metabolic Panel: Recent Labs  Lab 01/14/18 2203 01/14/18 2208  NA 139 141  K 4.3 4.2  CL 110 107  CO2 23  --   GLUCOSE 117* 117*  BUN 14 15  CREATININE 1.04 0.90  CALCIUM 9.3  --    GFR: Estimated  Creatinine Clearance: 64 mL/min (by C-G formula based on SCr of 0.9 mg/dL). Liver Function Tests: Recent Labs  Lab 01/14/18 2203  AST 26  ALT 21  ALKPHOS 72  BILITOT 0.7  PROT 6.8  ALBUMIN 3.8   No results for input(s): LIPASE, AMYLASE in the last 168 hours. No results for input(s): AMMONIA in the last 168 hours. Coagulation Profile: Recent Labs  Lab 01/14/18 2203  INR 1.01   Cardiac Enzymes: No results for input(s): CKTOTAL, CKMB, CKMBINDEX, TROPONINI in the last 168 hours. BNP (last 3 results) No results for input(s): PROBNP in the last 8760 hours. HbA1C: No results for input(s): HGBA1C in the last 72 hours. CBG: No results for input(s): GLUCAP in the last 168 hours. Lipid  Profile: No results for input(s): CHOL, HDL, LDLCALC, TRIG, CHOLHDL, LDLDIRECT in the last 72 hours. Thyroid Function Tests: No results for input(s): TSH, T4TOTAL, FREET4, T3FREE, THYROIDAB in the last 72 hours. Anemia Panel: No results for input(s): VITAMINB12, FOLATE, FERRITIN, TIBC, IRON, RETICCTPCT in the last 72 hours. Urine analysis:    Component Value Date/Time   COLORURINE YELLOW 10/09/2016 1858   APPEARANCEUR CLEAR 10/09/2016 1858   LABSPEC 1.017 10/09/2016 1858   PHURINE 6.0 10/09/2016 1858   GLUCOSEU NEGATIVE 10/09/2016 1858   HGBUR NEGATIVE 10/09/2016 1858   BILIRUBINUR NEGATIVE 10/09/2016 1858   KETONESUR NEGATIVE 10/09/2016 1858   PROTEINUR NEGATIVE 10/09/2016 1858   UROBILINOGEN 1.0 12/10/2014 2305   NITRITE NEGATIVE 10/09/2016 1858   LEUKOCYTESUR NEGATIVE 10/09/2016 1858    Radiological Exams on Admission: Ct Head Wo Contrast  Result Date: 01/15/2018 CLINICAL DATA:  Acute onset of speech difficulties. Vertigo. EXAM: CT HEAD WITHOUT CONTRAST TECHNIQUE: Contiguous axial images were obtained from the base of the skull through the vertex without intravenous contrast. COMPARISON:  MRI of the brain performed 10/09/2016 FINDINGS: Brain: No evidence of acute infarction, hemorrhage,  hydrocephalus, extra-axial collection or mass lesion / mass effect. Prominence of the ventricles and sulci reflects mild cortical volume loss. Mild periventricular and subcortical matter change likely reflects small vessel ischemic microangiopathy. The brainstem and fourth ventricle are within normal limits. The basal ganglia are unremarkable in appearance. The cerebral hemispheres demonstrate grossly normal gray-white differentiation. No mass effect or midline shift is seen. Vascular: No hyperdense vessel or unexpected calcification. Skull: There is no evidence of fracture; visualized osseous structures are unremarkable in appearance. Sinuses/Orbits: The orbits are within normal limits. The paranasal sinuses and mastoid air cells are well-aerated. Other: No significant soft tissue abnormalities are seen. IMPRESSION: 1. No acute intracranial pathology seen on CT. 2. Mild cortical volume loss and scattered small vessel ischemic microangiopathy. Electronically Signed   By: Roanna Raider M.D.   On: 01/15/2018 00:41    EKG: Independently reviewed.  Assessment/Plan Principal Problem:   TIA (transient ischemic attack) Active Problems:   Hypercholesterolemia   Hypertension    1. TIA - 1. TIA pathway 2. MRI/MRA head/neck 3. 2d echo 4. Tele monitor 5. A1C, FLP 6. Continue plavix 2. HLD - continue statin 3. HTN - hold BP meds and allow permissive HTN per Dr. Bess Harvest instructions  DVT prophylaxis: Lovenox Code Status: Full Family Communication: None Disposition Plan: Home after work up Consults called: Neuro Dr. Wilford Corner Admission status: Place in [place], [place] M. DO [facility] Pager [phone]  If 7AM-7PM, please contact day team taking care of patient [email or web address] Password [facility]  01/15/2018, 12:55 AM

## 2018-01-15 NOTE — ED Provider Notes (Signed)
[facility] EMERGENCY DEPARTMENT Provider Note   CSN: [id number] Arrival date & time: 01/14/18  2123     History   Chief Complaint Chief Complaint  Patient presents with  . Aphasia    HPI Edward [name] is a 77 y.o. male.  HPI 77 year old Caucasian male past medical history significant for prior TIA, hypertension, hyperlipidemia presents to the emergency department today for evaluation of aphasia.  Wife at bedside and patient states that approximately 8:00 this evening patient was having difficulties pronouncing words and finding the words to say.  The wife reports that this lasted for approximately 5 to 10 minutes and then self resolved.  Patient states his symptoms have completely resolved since arriving to the ED.  He does report having constant "dizziness".  States that this is been ongoing for 5 years.  He has taken meclizine for this in the past that has not helped.  He was going to follow-up with his primary care doctor tomorrow for his worsening dizziness.  He reports is a room spinning sensation.  He states that he does have vertigo but this just "feels like a foggy headedness".  Patient states that he was admitted last year around this time for TIA.  Patient had normal work-up including MRI, carotid artery ultrasound and cardiac ultrasound at that time.  Patient denies any symptoms at this time.  Denies any headache, vision changes.  Denies any lightheadedness, chest pain or shortness of breath.  Denies any associated weakness.  Pt denies any fever, chill, ha, vision changes, lightheadedness, congestion, neck pain, cp, sob, cough, abd pain, n/v/d, urinary symptoms, change in bowel habits, melena, hematochezia, lower extremity paresthesias.  Past Medical History:  Diagnosis Date  . Arthritis   . At risk for sleep apnea    STOP-BANG = 4    SENT TO PCP 02-10-215  . BPH (benign prostatic hypertrophy)   . Coronary artery disease CARIOLOGIST-- DR Donnie Aho   ANGIOPLASTY TO LAD  . Elevated PSA   . Frequency of urination   . History of kidney stones   . Hyperlipidemia   . Hypertension   . Nocturia   . Spermatocele    left  . Urgency of urination   . Vertigo     Patient Active Problem List   Diagnosis Date Noted  . TIA (transient ischemic attack) 10/09/2016  . Expressive aphasia 10/09/2016  . Slurred speech 10/09/2016  . Coronary artery disease   . Acute idiopathic pericarditis   . Hypercholesterolemia   . Hypertension   . Vertigo, intermittent     Past Surgical History:  Procedure Laterality Date  . CORONARY ANGIOPLASTY  1995  DR [place]   LAD  . CYSTOSCOPY N/A 09/18/2015   Procedure: CYSTOSCOPY;  Surgeon: Jerilee Field, MD;  [facility];  Service: Urology;  Laterality: N/A;  . CYSTOSCOPY WITH RETROGRADE PYELOGRAM, URETEROSCOPY AND STENT PLACEMENT Left 03/01/2013   Procedure: CYSTOSCOPY WITH LEFT RETROGRADE PYELOGRAM, LEFT URETEROSCOPY and stent PLACEMENT;  Surgeon: Antony Haste, MD;  Location: [facility];  Service: Urology;  Laterality: Left;  . FOOT FUSION Left    SECONDARY TO FX'S  . GREEN LIGHT LASER TURP (TRANSURETHRAL RESECTION OF PROSTATE N/A 10/04/2013   Procedure: GREEN LIGHT LASER TURP (TRANSURETHRAL RESECTION OF PROSTATE;  Surgeon: Antony Haste, MD;  [facility];  Service: Urology;  Laterality: N/A;  . INGUINAL HERNIA REPAIR Bilateral   . SHOULDER ARTHROSCOPY Right 2004  . SHOULDER ARTHROSCOPY WITH DEBRIDEMENT AND BICEP TENDON  REPAIR Left 04-28-2011  . SPERMATOCELECTOMY Left 10/04/2013   Procedure: LEFT SPERMATOCELECTOMY;  Surgeon: Antony Haste, MD;  [facility];  Service: Urology;  Laterality: Left;  . TRANSURETHRAL RESECTION OF PROSTATE N/A 12/11/2014   Procedure: CYSTOSCOPY, CLOT EVACUATION, WITH FULGURATION ;  Surgeon: Bjorn Pippin, MD;  [facility];  Service: Urology;  Laterality: N/A;  . TRANSURETHRAL RESECTION OF PROSTATE N/A 09/18/2015    Procedure: TRANSURETHRAL RESECTION OF THE PROSTATE (TURP);  Surgeon: Jerilee Field, MD;  [facility];  Service: Urology;  Laterality: N/A;  . UMBILICAL HERNIA REPAIR  06-02-2008        Home Medications    Prior to Admission medications   Medication Sig Start Date End Date Taking? Authorizing Provider  amLODipine (NORVASC) 10 MG tablet Take 5 mg by mouth daily.    Yes   atenolol (TENORMIN) 50 MG tablet Take 25 mg by mouth daily.    Yes [provider]  clopidogrel (PLAVIX) 75 MG tablet Take 1 tablet (75 mg total) by mouth daily. 10/11/16  Yes Leroy Sea, MD  Coenzyme Q10 (CO Q 10 PO) Take 1 capsule by mouth daily.   Yes [provider]  losartan (COZAAR) 100 MG tablet Take 50 mg by mouth 2 (two) times daily.   Yes [provider]  rosuvastatin (CRESTOR) 20 MG tablet Take 0.5 tablets (10 mg total) by mouth every evening. 10/10/16  Yes Leroy Sea, MD    Family History No family history on file.  Social History Social History   Tobacco Use  . Smoking status: Former Smoker    Packs/day: 0.50    Years: 3.00    Pack years: 1.50    Types: Cigarettes    Last attempt to quit: 12/18/1970    Years since quitting: 47.1  . Smokeless tobacco: Never Used  Substance Use Topics  . Alcohol use: Yes    Comment: OCCASIONAL  . Drug use: No     Allergies   Patient has no known allergies.   Review of Systems Review of Systems  All other systems reviewed and are negative.    Physical Exam Updated Vital Signs BP (!) 147/79   Pulse (!) 55   Temp 98.5 F (36.9 C) (Oral)   Resp (!) 22   Ht 5\' 6"  (1.676 m)   Wt 72.6 kg (160 lb)   SpO2 98%   BMI 25.82 kg/m   Physical Exam  Constitutional: He is oriented to person, place, and time. He appears well-developed and well-nourished.  Non-toxic appearance. No distress.  HENT:  Head: Normocephalic and atraumatic.  Mouth/Throat: Oropharynx is clear and moist.  Eyes: Pupils are equal, round, and  reactive to light. Conjunctivae are normal. Right eye exhibits no discharge. Left eye exhibits no discharge.  Neck: Normal range of motion. Neck supple.  Cardiovascular: Normal rate, regular rhythm, normal heart sounds and intact distal pulses. Exam reveals no gallop and no friction rub.  No murmur heard. Pulmonary/Chest: Effort normal and breath sounds normal. No respiratory distress. He exhibits no tenderness.  Abdominal: Soft. Bowel sounds are normal. He exhibits no distension. There is no tenderness. There is no rebound and no guarding.  Musculoskeletal: Normal range of motion. He exhibits no tenderness.  Lymphadenopathy:    He has no cervical adenopathy.  Neurological: He is alert and oriented to person, place, and time.  The patient is alert, attentive, and oriented x 3. Speech is clear. Cranial nerve II-VII grossly intact. Negative pronator drift. Sensation  intact. Strength 5/5 in all extremities. Reflexes 2+ and symmetric at biceps, triceps, knees, and ankles. Rapid alternating movement and fine finger movements intact. Romberg is absent. Posture and gait normal.   Skin: Skin is warm and dry. Capillary refill takes less than 2 seconds. No rash noted.  Psychiatric: His behavior is normal. Judgment and thought content normal.  Nursing note and vitals reviewed.    ED Treatments / Results  Labs (all labs ordered are listed, but only abnormal results are displayed) Labs Reviewed  CBC - Abnormal; Notable for the following components:      Result Value   RBC 4.20 (*)    Hemoglobin 12.9 (*)    All other components within normal limits  COMPREHENSIVE METABOLIC PANEL - Abnormal; Notable for the following components:   Glucose, Bld 117 (*)    All other components within normal limits  I-STAT CHEM 8, ED - Abnormal; Notable for the following components:   Glucose, Bld 117 (*)    TCO2 21 (*)    Hemoglobin 12.6 (*)    HCT 37.0 (*)    All other components within normal limits    PROTIME-INR  APTT  DIFFERENTIAL  I-STAT TROPONIN, ED  CBG MONITORING, ED    EKG None  Radiology Ct Head Wo Contrast  Result Date: 01/15/2018 CLINICAL DATA:  Acute onset of speech difficulties. Vertigo. EXAM: CT HEAD WITHOUT CONTRAST TECHNIQUE: Contiguous axial images were obtained from the base of the skull through the vertex without intravenous contrast. COMPARISON:  MRI of the brain performed 10/09/2016 FINDINGS: Brain: No evidence of acute infarction, hemorrhage, hydrocephalus, extra-axial collection or mass lesion / mass effect. Prominence of the ventricles and sulci reflects mild cortical volume loss. Mild periventricular and subcortical matter change likely reflects small vessel ischemic microangiopathy. The brainstem and fourth ventricle are within normal limits. The basal ganglia are unremarkable in appearance. The cerebral hemispheres demonstrate grossly normal gray-white differentiation. No mass effect or midline shift is seen. Vascular: No hyperdense vessel or unexpected calcification. Skull: There is no evidence of fracture; visualized osseous structures are unremarkable in appearance. Sinuses/Orbits: The orbits are within normal limits. The paranasal sinuses and mastoid air cells are well-aerated. Other: No significant soft tissue abnormalities are seen. IMPRESSION: 1. No acute intracranial pathology seen on CT. 2. Mild cortical volume loss and scattered small vessel ischemic microangiopathy. Electronically Signed   By: Roanna RaiderJeffery  Chang M.D.   On: 01/15/2018 00:41    Procedures Procedures (including critical care time)  Medications Ordered in ED Medications  sodium chloride 0.9 % bolus 1,000 mL (has no administration in time range)     Initial Impression / Assessment and Plan / ED Course  I have reviewed the triage vital signs and the nursing notes.  Pertinent labs & imaging results that were available during my care of the patient were reviewed by me and considered in my  medical decision making (see chart for details).     Patient presents to the ED for aphasia and TIA symptoms.  Patient reports history of TIA and was admitted 1 year ago was normal work-up.  Patient also reports ongoing chronic dizziness.  All symptoms have completely resolved.  Patient has no focal neuro deficit on exam.  Lab work is reassuring.  CT scan shows no acute findings.  Spoke with Dr. Rozelle LoganAror with neurology.  He recommends the patient be admitted for TIA work-up again.  Will order MRIs.  Spoke with Dr. Julian ReilGardner with hospital medicine for admission for TIA work-up.  Patient remained stable at this time and was updated on plan of care.  Patient discussed with my attending who is agreeable to this plan.  Final Clinical Impressions(s) / ED Diagnoses   Final diagnoses:  TIA (transient ischemic attack)    ED Discharge Orders    None       Rise Mu, PA-C 01/15/18 [id number]    Geoffery Lyons, MD 01/15/18 [phone]

## 2018-01-15 NOTE — Progress Notes (Signed)
[name] is anxious to go home as soon as possible.  His TIA signs and symptoms appear to have resolved, although he notes that he has had "balance issues" for the past few months.  He would like to see EENT soon to address his balance.  Nevertheless, he ambulates without assist and appears to be in no distress.  We will continue to monitor, pending a possible discharge today.

## 2018-01-19 ENCOUNTER — Ambulatory Visit: Payer: Medicare Other | Attending: Internal Medicine

## 2018-01-19 ENCOUNTER — Other Ambulatory Visit: Payer: Self-pay

## 2018-01-19 DIAGNOSIS — H8113 Benign paroxysmal vertigo, bilateral: Secondary | ICD-10-CM | POA: Diagnosis present

## 2018-01-19 DIAGNOSIS — R42 Dizziness and giddiness: Secondary | ICD-10-CM | POA: Insufficient documentation

## 2018-01-19 DIAGNOSIS — R2689 Other abnormalities of gait and mobility: Secondary | ICD-10-CM

## 2018-01-19 NOTE — Therapy (Signed)
[facility] [address] [place], [place], [address] Phone: [phone]   Fax:  [phone]  Physical Therapy Evaluation  Patient Details  Name: Edward [name] MRN: [id number] Date of Birth: 1940-11-10 Referring Provider: Dr. Ashley Royalty   Encounter Date: 01/19/2018  PT End of Session - 01/19/18 1226    Visit Number  1    Number of Visits  7    Date for PT Re-Evaluation  02/18/18    Authorization Type  UHC Medicare    PT Start Time  0803    PT Stop Time  0848    PT Time Calculation (min)  45 min    Activity Tolerance  Patient tolerated treatment well    Behavior During Therapy  [facility] for tasks assessed/performed       Past Medical History:  Diagnosis Date  . Arthritis   . At risk for sleep apnea    STOP-BANG = 4    SENT TO PCP 02-10-215  . BPH (benign prostatic hypertrophy)   . Coronary artery disease CARIOLOGIST-- DR Donnie Aho   ANGIOPLASTY TO LAD  . Elevated PSA   . Frequency of urination   . History of kidney stones   . Hyperlipidemia   . Hypertension   . Nocturia   . Spermatocele    left  . Urgency of urination   . Vertigo     Past Surgical History:  Procedure Laterality Date  . CORONARY ANGIOPLASTY  1995  DR [place]   LAD  . CYSTOSCOPY N/A 09/18/2015   Procedure: CYSTOSCOPY;  Surgeon: Jerilee Field, MD;  Location: [facility];  Service: Urology;  Laterality: N/A;  . CYSTOSCOPY WITH RETROGRADE PYELOGRAM, URETEROSCOPY AND STENT PLACEMENT Left 03/01/2013   Procedure: CYSTOSCOPY WITH LEFT RETROGRADE PYELOGRAM, LEFT URETEROSCOPY and stent PLACEMENT;  Surgeon: Antony Haste, MD;  [facility];  Service: Urology;  Laterality: Left;  . FOOT FUSION Left    SECONDARY TO FX'S  . GREEN LIGHT LASER TURP (TRANSURETHRAL RESECTION OF PROSTATE N/A 10/04/2013   Procedure: GREEN LIGHT LASER TURP (TRANSURETHRAL RESECTION OF PROSTATE;  Surgeon: Antony Haste, MD;  Location: [facility];   Service: Urology;  Laterality: N/A;  . INGUINAL HERNIA REPAIR Bilateral   . SHOULDER ARTHROSCOPY Right 2004  . SHOULDER ARTHROSCOPY WITH DEBRIDEMENT AND BICEP TENDON REPAIR Left 04-28-2011  . SPERMATOCELECTOMY Left 10/04/2013   Procedure: LEFT SPERMATOCELECTOMY;  Surgeon: Antony Haste, MD;  [facility];  Service: Urology;  Laterality: Left;  . TRANSURETHRAL RESECTION OF PROSTATE N/A 12/11/2014   Procedure: CYSTOSCOPY, CLOT EVACUATION, WITH FULGURATION ;  Surgeon: Bjorn Pippin, MD;  [facility];  Service: Urology;  Laterality: N/A;  . TRANSURETHRAL RESECTION OF PROSTATE N/A 09/18/2015   Procedure: TRANSURETHRAL RESECTION OF THE PROSTATE (TURP);  Surgeon: Jerilee Field, MD;  Location[facility];  Service: Urology;  Laterality: N/A;  . UMBILICAL HERNIA REPAIR  06-02-2008    There were no vitals filed for this visit.   Subjective Assessment - 01/19/18 0811    Subjective  Pt reported dizziness is constant and intensity fluctuates 1-4/10 dizziness. He has experienced vertigo (wavy, tilted, not spinning) 10/10 dizziness, which began about 3-4 years ago. He feels like it is getting worse. Pt has fallen once in the last 6 months, while in the shower (slipped on shower floor). Pt denied major injury. Pt recently hospitalized for a second TIA, he had slurred speech, difficulty reading, and difficulty with comprehension. Pt had cataract surgery a few  weeks ago on his L eye, and is awaiting surgery for his R eye. He feels like he can "feel the scar" where the surgery was and MD told him it could go away. He also has trouble focusing. He occasionally has an earache in L ear. Pt reported painting also incr. dizziness, he recently had a "bump up" while driving back home from the beach 2/2 dizziness.    Pertinent History  HTN, CAD, TIA, Acute pericarditis, HLD, vertigo, arthritis    Patient Stated Goals  To get rid of this issue (dizziness).     Currently in Pain?  No/denies         [facility] PT  Assessment - 01/19/18 0819      Assessment   Medical Diagnosis  TIA with main c/o vertigo    Referring Provider  Dr. Ashley Royalty    Onset Date/Surgical Date  01/20/15 3-4 years ago    Hand Dominance  Right    Prior Therapy  none      Precautions   Precautions  Fall      Restrictions   Weight Bearing Restrictions  No      Balance Screen   Has the patient fallen in the past 6 months  Yes    How many times?  1    Has the patient had a decrease in activity level because of a fear of falling?   Yes    Is the patient reluctant to leave their home because of a fear of falling?   No      Home Nurse, mental health  Private residence    Living Arrangements  Spouse/significant other    Available Help at Discharge  Family    Type of Home  House    Home Access  Stairs to enter    Entrance Stairs-Number of Steps  2    Entrance Stairs-Rails  None    Home Layout  Two level;Able to live on main level with bedroom/bathroom mostly lives upstairs    Alternate Level Stairs-Number of Steps  13    Alternate Level Stairs-Rails  Left    Home Equipment  None      Prior Function   Level of Independence  Independent    Vocation  Retired    Leisure  Go to [employer], go fishing       Cognition   Overall Cognitive Status  Within Functional Limits for tasks assessed      Ambulation/Gait   Ambulation/Gait  Yes    Ambulation/Gait Assistance  5: Supervision    Ambulation/Gait Assistance Details  S to ensure safety.    Ambulation Distance (Feet)  100 Feet    Assistive device  None    Gait Pattern  Step-through pattern;Decreased stride length;Decreased trunk rotation    Ambulation Surface  Level;Indoor    Gait velocity  3.47ft/sec. no AD           Vestibular Assessment - 01/19/18 0829      Vestibular Assessment   General Observation  Pt does not sleep well. Baseline dizziness at rest today: 1-2/10      Symptom Behavior   Type of Dizziness  Comment foggy, funny feeling in head     Frequency of Dizziness  constant    Duration of Dizziness  all the time    Aggravating Factors  -- looking up and down consistently  (painting walls)    Relieving Factors  Rest      Occulomotor Exam  Occulomotor Alignment  Normal    Spontaneous  Absent    Gaze-induced  Left beating nystagmus with L gaze    Smooth Pursuits  Intact    Saccades  Intact    Comment  B HIT negative.      Vestibulo-Occular Reflex   VOR 1 Head Only (x 1 viewing)  VOR: incr. in dizziness to 2-3/10 but PT noted L beating nystagmus.    VOR Cancellation  Normal      Positional Testing   Dix-Hallpike  Dix-Hallpike Right;Dix-Hallpike Left    Horizontal Canal Testing  Horizontal Canal Right;Horizontal Canal Left      Dix-Hallpike Right   Dix-Hallpike Right Duration  Slight dizziness reported by pt and > 1 minute nystagmus    Dix-Hallpike Right Symptoms  Downbeat Nystagmus with intermittent torsional component      Dix-Hallpike Left   Dix-Hallpike Left Duration  Slight incr. dizziness with downbeating > .    Dix-Hallpike Left Symptoms  Downbeat Nystagmus with intermittent torsional component      Horizontal Canal Right   Horizontal Canal Right Duration  none    Horizontal Canal Right Symptoms  Normal      Horizontal Canal Left   Horizontal Canal Left Duration  approx. 5 sec. of direction changing nystagmus    Horizontal Canal Left Symptoms  Nystagmus      Positional Sensitivities   Supine to Sitting  Lightheadedness          Objective measurements completed on examination: See above findings.              PT Education - 01/19/18 1225    Education Details  PT discussed exam findings, PT POC, duration, and frequency. PT discussed the importance of f/u with neurologist per hospital d/c recommendations. PT also educated pt that some of the occulomotor exam findings were more consistent with central causes of vertigo vs. peripheral.     Person(s) Educated  Patient    Methods   Explanation    Comprehension  Verbalized understanding       PT Short Term Goals - 01/19/18 1450      PT SHORT TERM GOAL #1   Title  same as LTGs        PT Long Term Goals - 01/19/18 1546      PT LONG TERM GOAL #1   Title  Pt will be IND in HEP to reduce dizziness, improve balance, and strength. TARGET DATE FOR ALL LTGS: 02/16/18    Status  New      PT LONG TERM GOAL #2   Title  Perform FGA and write goal as indicated.     Status  New      PT LONG TERM GOAL #3   Title  Pt will amb. outdoors, 500', over uneven terrain IND while performing head turns to improve safety during functional mobility.     Status  New      PT LONG TERM GOAL #4   Title  Pt will report baseline dizziness </=1/10 to improve QOL and safety during ADLs.     Status  New      PT LONG TERM GOAL #5   Title  Reassess positional testing and write goals as indicated.     Status  New             Plan - 01/19/18 1227    Clinical Impression Statement  Pt is a pleasant 77y/o male presenting to [facility] neuro for vertigo after a TIA (  12/2017). Pt's PMH significant for the following: HTN, CAD, TIA, Acute pericarditis, HLD, vertigo, arthritis. The following deficits were noted upon exam: dizziness, gait deviations, postural dysfunction, and balance likely impaired based on pt's report. PT will formally assess pt's balance next session. Pt's gait speed was WNL. The pt experienced downbeating (with intermittent torsional component) during R and L Dix-Hallpike and during L horizontal canal testing. During occulomotor exam pt experienced s/s more consistent with central component vs. peripheral component, pt does have hx of two TIAs and reported imaging was negative. However, pt was told to f/u with neurologist after hospital d/c, PT encouraged pt to f/u today.     History and Personal Factors relevant to plan of care:  pt active prior to dizziness, fearful of activities over uneven terrain 2/2 fear of falling due to  dizziness.    Clinical Presentation  Evolving    Clinical Presentation due to:  HTN, CAD, TIA, Acute pericarditis, HLD, vertigo, arthritis    Clinical Decision Making  Moderate    Rehab Potential  Good    Clinical Impairments Affecting Rehab Potential  see above    PT Frequency  -- 2x/week for 2 weeks and then 1x/week for 2 weeks    PT Treatment/Interventions  ADLs/Self Care Home Management;Biofeedback;Canalith Repostioning;Therapeutic exercise;Manual techniques;Vestibular;Therapeutic activities;Functional mobility training;Stair training;Gait training;Patient/family education;DME Instruction;Neuromuscular re-education;Balance training    PT Next Visit Plan  Reassess for BPPV, habituation, perform FGA    Consulted and Agree with Plan of Care  Patient       Patient will benefit from skilled therapeutic intervention in order to improve the following deficits and impairments:  Abnormal gait, Postural dysfunction, Dizziness, Decreased cognition, Decreased balance, Decreased mobility, Decreased knowledge of use of DME, Decreased strength(impaired strength based on gait deviations)  Visit Diagnosis: Dizziness and giddiness - Plan: PT plan of care cert/re-cert  BPPV (benign paroxysmal positional vertigo), bilateral - Plan: PT plan of care cert/re-cert  Other abnormalities of gait and mobility - Plan: PT plan of care cert/re-cert     Problem List Patient Active Problem List   Diagnosis Date Noted  . TIA (transient ischemic attack) 10/09/2016  . Expressive aphasia 10/09/2016  . Slurred speech 10/09/2016  . Coronary artery disease   . Acute idiopathic pericarditis   . Hypercholesterolemia   . Hypertension   . Vertigo, intermittent     Micalah Cabezas L 01/19/2018, 3:50 PM  [facility] [address] [place], [place], [address] Phone: [phone]   Fax:  [phone]  Name: Edward [name] MRN: [id number] Date of Birth:  1940-09-26  Zerita BoersJennifer Akansha Wyche, PT,DPT 01/19/18 3:51 PM Phone: [phone] Fax: [phone]

## 2018-01-28 ENCOUNTER — Encounter

## 2018-01-29 ENCOUNTER — Ambulatory Visit: Payer: Medicare Other

## 2018-01-29 VITALS — BP 137/81 | HR 70

## 2018-01-29 DIAGNOSIS — R42 Dizziness and giddiness: Secondary | ICD-10-CM

## 2018-01-29 DIAGNOSIS — R2689 Other abnormalities of gait and mobility: Secondary | ICD-10-CM

## 2018-01-29 NOTE — Patient Instructions (Signed)
Gaze Stabilization: Tip Card  1.Target must remain in focus, not blurry, and appear stationary while head is in motion. 2.Perform exercises with small head movements (45 to either side of midline). 3.Increase speed of head motion so long as target is in focus. 4.If you wear eyeglasses, be sure you can see target through lens (therapist will give specific instructions for bifocal / progressive lenses). 5.These exercises may provoke dizziness or nausea. Work through these symptoms. If too dizzy, slow head movement slightly. Rest between each exercise. 6.Exercises demand concentration; avoid distractions.  Copyright  VHI. All rights reserved.    Gaze Stabilization: Sitting    Keeping eyes on target on wall 3-5 feet away, tilt head down 15-30 and move head side to side for __20-30__ seconds. Repeat while moving head up and down for _20-30___ seconds. Do __2__ sessions per day.   Copyright  VHI. All rights reserved.    

## 2018-01-29 NOTE — Therapy (Signed)
[facility] [address] [place], [place], [address] Phone: [phone]   Fax:  [phone]  Physical Therapy Treatment  Patient Details  Name: Edward [name] MRN: [id number] Date of Birth: 1941-03-05 Referring Provider: Dr. Ashley Royalty   Encounter Date: 01/29/2018  PT End of Session - 01/29/18 0842    Visit Number  2    Number of Visits  7    Date for PT Re-Evaluation  02/18/18    Authorization Type  [employer]HC Medicare    PT Start Time  0801    PT Stop Time  0839    PT Time Calculation (min)  38 min    Equipment Utilized During Treatment  -- min guard to S prn    Activity Tolerance  Patient tolerated treatment well    Behavior During Therapy  [facility] for tasks assessed/performed       Past Medical History:  Diagnosis Date  . Arthritis   . At risk for sleep apnea    STOP-BANG = 4    SENT TO PCP 02-10-215  . BPH (benign prostatic hypertrophy)   . Coronary artery disease CARIOLOGIST-- DR Donnie Aho   ANGIOPLASTY TO LAD  . Elevated PSA   . Frequency of urination   . History of kidney stones   . Hyperlipidemia   . Hypertension   . Nocturia   . Spermatocele    left  . Urgency of urination   . Vertigo     Past Surgical History:  Procedure Laterality Date  . CORONARY ANGIOPLASTY  1995  DR [place]   LAD  . CYSTOSCOPY N/A 09/18/2015   Procedure: CYSTOSCOPY;  Surgeon: Jerilee Field, MD;  [facility];  Service: Urology;  Laterality: N/A;  . CYSTOSCOPY WITH RETROGRADE PYELOGRAM, URETEROSCOPY AND STENT PLACEMENT Left 03/01/2013   Procedure: CYSTOSCOPY WITH LEFT RETROGRADE PYELOGRAM, LEFT URETEROSCOPY and stent PLACEMENT;  Surgeon: Antony Haste, MD;  [facility];  Service: Urology;  Laterality: Left;  . FOOT FUSION Left    SECONDARY TO FX'S  . GREEN LIGHT LASER TURP (TRANSURETHRAL RESECTION OF PROSTATE N/A 10/04/2013   Procedure: GREEN LIGHT LASER TURP (TRANSURETHRAL RESECTION OF  PROSTATE;  Surgeon: Antony Haste, MD;  Location[facility];  Service: Urology;  Laterality: N/A;  . INGUINAL HERNIA REPAIR Bilateral   . SHOULDER ARTHROSCOPY Right 2004  . SHOULDER ARTHROSCOPY WITH DEBRIDEMENT AND BICEP TENDON REPAIR Left 04-28-2011  . SPERMATOCELECTOMY Left 10/04/2013   Procedure: LEFT SPERMATOCELECTOMY;  Surgeon: Antony Haste, MD;  [facility];  Service: Urology;  Laterality: Left;  . TRANSURETHRAL RESECTION OF PROSTATE N/A 12/11/2014   Procedure: CYSTOSCOPY, CLOT EVACUATION, WITH FULGURATION ;  Surgeon: Bjorn Pippin, MD;  Location[facility];  Service: Urology;  Laterality: N/A;  . TRANSURETHRAL RESECTION OF PROSTATE N/A 09/18/2015   Procedure: TRANSURETHRAL RESECTION OF THE PROSTATE (TURP);  Surgeon: Jerilee Field, MD;  [facility];  Service: Urology;  Laterality: N/A;  . UMBILICAL HERNIA REPAIR  06-02-2008    Vitals:   01/29/18 0809  BP: 137/81  Pulse: 70    Subjective Assessment - 01/29/18 0806    Subjective  Pt reported he made the neurologist appt. for 02/2018 to f/u after TIA on 01/14/18. Pt reported he feels a little better since stopping Atenolol per MD. MD wants pt to have a Loop Recorder testing and another heart test.     Pertinent History  HTN, CAD, TIA, Acute pericarditis, HLD, vertigo, arthritis    Patient  Stated Goals  To get rid of this issue (dizziness).     Currently in Pain?  No/denies         [facility] - 01/29/18 0814      Functional Gait  Assessment   Gait assessed   Yes    Gait Level Surface  Walks 20 ft in less than 7 sec but greater than 5.5 sec, uses assistive device, slower speed, mild gait deviations, or deviates 6-10 in outside of the 12 in walkway width. 6.3 sec.    Change in Gait Speed  Able to smoothly change walking speed without loss of balance or gait deviation. Deviate no more than 6 in outside of the 12 in walkway width.    Gait with Horizontal Head Turns  Performs head turns with moderate  changes in gait velocity, slows down, deviates 10-15 in outside 12 in walkway width but recovers, can continue to walk.    Gait with Vertical Head Turns  Performs task with slight change in gait velocity (eg, minor disruption to smooth gait path), deviates 6 - 10 in outside 12 in walkway width or uses assistive device    Gait and Pivot Turn  Pivot turns safely within 3 sec and stops quickly with no loss of balance.    Step Over Obstacle  Is able to step over 2 stacked shoe boxes taped together (9 in total height) without changing gait speed. No evidence of imbalance.    Gait with Narrow Base of Support  Ambulates 4-7 steps. 5 steps    Gait with Eyes Closed  Walks 20 ft, slow speed, abnormal gait pattern, evidence for imbalance, deviates 10-15 in outside 12 in walkway width. Requires more than 9 sec to ambulate 20 ft.    Ambulating Backwards  Walks 20 ft, uses assistive device, slower speed, mild gait deviations, deviates 6-10 in outside 12 in walkway width.    Steps  Alternating feet, no rail.    Total Score  21    FGA comment:  21/30: indicates pt is at a medium falls risk.                     Vestibular Treatment/Exercise - 01/29/18 0825      Vestibular Treatment/Exercise   Vestibular Treatment Provided  Gaze    Gaze Exercises  X1 Viewing Horizontal;X1 Viewing Vertical      X1 Viewing Horizontal   Foot Position  seated    Time  -- 20-30 sec.     Reps  3    Comments  Pt required cues and demo for technique and reported 5/10 dizziness which resides within 5-10sec.  Please see pt instructions for HEP details      X1 Viewing Vertical   Foot Position  seated    Time  -- 20-30 sec.     Reps  3    Comments  Cues and demo for technique. Please see pt instrucitons for HEP details.            Self Care:  PT Education - 01/29/18 0840    Education Details  PT provided pt with gaze stabilization HEP and educated pt on FGA score meaning. PT spent first 9 minutes of session  discussing dates of TIA, as pt thought he had another [facility] last week [facility], then realized it was on 01/14/18 and not 01/21/18. PT discussed the importance of f/u with neurologist again based on exam findings last week and that  we will continue to monitor positional vertigo but that pt will most likely benefit most from habituation and gaze exercises.     Person(s) Educated  Patient    Methods  Explanation;Demonstration;Tactile cues;Verbal cues;Handout    Comprehension  Returned demonstration;Verbalized understanding;Need further instruction       PT Short Term Goals - 01/19/18 1450      PT SHORT TERM GOAL #1   Title  same as LTGs        PT Long Term Goals - 01/29/18 0844      PT LONG TERM GOAL #1   Title  Pt will be IND in HEP to reduce dizziness, improve balance, and strength. TARGET DATE FOR ALL LTGS: 02/16/18    Status  New      PT LONG TERM GOAL #2   Title  Perform FGA and write goal as indicated.     Status  Achieved      PT LONG TERM GOAL #3   Title  Pt will amb. outdoors, 500', over uneven terrain IND while performing head turns to improve safety during functional mobility.     Status  New      PT LONG TERM GOAL #4   Title  Pt will report baseline dizziness </=1/10 to improve QOL and safety during ADLs.     Status  New      PT LONG TERM GOAL #5   Title  Reassess positional testing and write goals as indicated.     Status  New      Additional Long Term Goals   Additional Long Term Goals  Yes      PT LONG TERM GOAL #6   Title  Pt will improve FGA score to >/=29/30 to decr. falls risk.     Status  New            Plan - 01/29/18 1610    Clinical Impression Statement  Pt's FGA score indicates pt is at a medium risk for falls. Pt tolerated x1 viewing activity well, therefore, PT added exercise to HEP. Pt's cause of dizziness likely a combination of hypofunction and central components, which PT will continue to monitor and inform MD as indicated.  Continue with POC.     Rehab Potential  Good    Clinical Impairments Affecting Rehab Potential  see above    PT Frequency  -- 2x/week for 2 weeks and then 1x/week for 2 weeks    PT Treatment/Interventions  ADLs/Self Care Home Management;Biofeedback;Canalith Repostioning;Therapeutic exercise;Manual techniques;Vestibular;Therapeutic activities;Functional mobility training;Stair training;Gait training;Patient/family education;DME Instruction;Neuromuscular re-education;Balance training    PT Next Visit Plan  Reassess for BPPV, habituation, review x1 viewing prn.    Consulted and Agree with Plan of Care  Patient       Patient will benefit from skilled therapeutic intervention in order to improve the following deficits and impairments:  Abnormal gait, Postural dysfunction, Dizziness, Decreased cognition, Decreased balance, Decreased mobility, Decreased knowledge of use of DME, Decreased strength(impaired strength based on gait deviations)  Visit Diagnosis: Dizziness and giddiness  Other abnormalities of gait and mobility     Problem List Patient Active Problem List   Diagnosis Date Noted  . TIA (transient ischemic attack) 10/09/2016  . Expressive aphasia 10/09/2016  . Slurred speech 10/09/2016  . Coronary artery disease   . Acute idiopathic pericarditis   . Hypercholesterolemia   . Hypertension   . Vertigo, intermittent     Arther Heisler L 01/29/2018, 8:44 AM  [facility]  [address] [place], [place], [address] Phone: [phone]   Fax:  [phone]  Name: Edward [name] MRN: [id number] Date of Birth: Dec 20, 1940  Zerita Boers, PT,DPT 01/29/18 8:45 AM Phone: [phone] Fax: [phone]

## 2018-02-02 ENCOUNTER — Ambulatory Visit: Payer: Medicare Other

## 2018-02-02 DIAGNOSIS — R42 Dizziness and giddiness: Secondary | ICD-10-CM | POA: Diagnosis not present

## 2018-02-02 DIAGNOSIS — R2689 Other abnormalities of gait and mobility: Secondary | ICD-10-CM

## 2018-02-02 NOTE — Patient Instructions (Addendum)
   Sit to Side-Lying    Sit on edge of bed. 1. Turn head 45 to right. 2. Maintain head position and lie down slowly on left side. Hold until symptoms subside, plus 30 seconds.. 3. Sit up slowly. Hold until symptoms subside, plus 30 seconds.. 4. Turn head 45 to left. 5. Maintain head position and lie down slowly on right side. Hold until symptoms subside, plus 30 seconds.. 6. Sit up slowly. Repeat sequence __3-5__ times per session. Do __1__ sessions per day.  Copyright  VHI. All rights reserved.   Gaze Stabilization: Sitting    Keeping eyes on target on wall 3-5 feet away, tilt head down 15-30 and move head side to side for __20-30__ seconds. Repeat while moving head up and down for __20-30__ seconds. Do __2__ sessions per day.  Copyright  VHI. All rights reserved.

## 2018-02-02 NOTE — Therapy (Signed)
[facility] [address] [place], [place], [address] Phone: [phone]   Fax:  [phone]  Physical Therapy Treatment  Patient Details  Name: Edward [name] MRN: [id number] Date of Birth: May 15, 1941 Referring Provider: Dr. Ashley Royalty   Encounter Date: 02/02/2018  PT End of Session - 02/02/18 0921    Visit Number  3    Number of Visits  7    Date for PT Re-Evaluation  02/18/18    Authorization Type  [employer] Medicare    PT Start Time  0848    PT Stop Time  0929    PT Time Calculation (min)  41 min    Activity Tolerance  Patient tolerated treatment well    Behavior During Therapy  [facility] for tasks assessed/performed       Past Medical History:  Diagnosis Date  . Arthritis   . At risk for sleep apnea    STOP-BANG = 4    SENT TO PCP 02-10-215  . BPH (benign prostatic hypertrophy)   . Coronary artery disease CARIOLOGIST-- DR Donnie Aho   ANGIOPLASTY TO LAD  . Elevated PSA   . Frequency of urination   . History of kidney stones   . Hyperlipidemia   . Hypertension   . Nocturia   . Spermatocele    left  . Urgency of urination   . Vertigo     Past Surgical History:  Procedure Laterality Date  . CORONARY ANGIOPLASTY  1995  DR [place]   LAD  . CYSTOSCOPY N/A 09/18/2015   Procedure: CYSTOSCOPY;  Surgeon: Jerilee Field, MD;  Location: [facility];  Service: Urology;  Laterality: N/A;  . CYSTOSCOPY WITH RETROGRADE PYELOGRAM, URETEROSCOPY AND STENT PLACEMENT Left 03/01/2013   Procedure: CYSTOSCOPY WITH LEFT RETROGRADE PYELOGRAM, LEFT URETEROSCOPY and stent PLACEMENT;  Surgeon: Antony Haste, MD;  Location: [facility];  Service: Urology;  Laterality: Left;  . FOOT FUSION Left    SECONDARY TO FX'S  . GREEN LIGHT LASER TURP (TRANSURETHRAL RESECTION OF PROSTATE N/A 10/04/2013   Procedure: GREEN LIGHT LASER TURP (TRANSURETHRAL RESECTION OF PROSTATE;  Surgeon: Antony Haste, MD;  Location: [facility];   Service: Urology;  Laterality: N/A;  . INGUINAL HERNIA REPAIR Bilateral   . SHOULDER ARTHROSCOPY Right 2004  . SHOULDER ARTHROSCOPY WITH DEBRIDEMENT AND BICEP TENDON REPAIR Left 04-28-2011  . SPERMATOCELECTOMY Left 10/04/2013   Procedure: LEFT SPERMATOCELECTOMY;  Surgeon: Antony Haste, MD;  Location: [facility];  Service: Urology;  Laterality: Left;  . TRANSURETHRAL RESECTION OF PROSTATE N/A 12/11/2014   Procedure: CYSTOSCOPY, CLOT EVACUATION, WITH FULGURATION ;  Surgeon: Bjorn Pippin, MD;  Location: [facility];  Service: Urology;  Laterality: N/A;  . TRANSURETHRAL RESECTION OF PROSTATE N/A 09/18/2015   Procedure: TRANSURETHRAL RESECTION OF THE PROSTATE (TURP);  Surgeon: Jerilee Field, MD;  Location: [facility];  Service: Urology;  Laterality: N/A;  . UMBILICAL HERNIA REPAIR  06-02-2008    There were no vitals filed for this visit.  Subjective Assessment - 02/02/18 0849    Subjective  Pt denied falls since last visit. Pt has been performing HEP but feels dizziness is about the same, but feels he's turning head too far.     Pertinent History  HTN, CAD, TIA, Acute pericarditis, HLD, vertigo, arthritis    Patient Stated Goals  To get rid of this issue (dizziness).     Currently in Pain?  No/denies             Vestibular Assessment -  02/02/18 0900      Positional Testing   Dix-Hallpike  Dix-Hallpike Right;Dix-Hallpike Left    Horizontal Canal Testing  Horizontal Canal Right;Horizontal Canal Left      Dix-Hallpike Right   Dix-Hallpike Right Duration  2/10 dizziness, for > 1 minute    Dix-Hallpike Right Symptoms  Downbeat Nystagmus      Dix-Hallpike Left   Dix-Hallpike Left Duration  2/10 dizziness >1 minute    Dix-Hallpike Left Symptoms  Downbeat Nystagmus      Horizontal Canal Right   Horizontal Canal Right Duration  none    Horizontal Canal Right Symptoms  Edward      Horizontal Canal Left   Horizontal Canal Left Duration  1-2 beats of nystagmus but no dizziness     Horizontal Canal Left Symptoms  Nystagmus directional changes      Orthostatics   BP supine (x 5 minutes)  127/72    HR supine (x 5 minutes)  59    BP sitting  124/71 mild lightheadedness    HR sitting  62    BP standing (after 1 minute)  119/67 slight lightheadedness for a few seconds.    HR standing (after 1 minute)  65    Orthostatics Comment  Pt reported lightheadedness "no more than usual".        Neuro re-ed; x1 viewing, please see pt instructions for details. Cues to improve technique and reduce rotation range.         Vestibular Treatment/Exercise - 02/02/18 0914      Vestibular Treatment/Exercise   Vestibular Treatment Provided  Habituation    Habituation Exercises  Gari CrownBrandt Daroff      Brandt Daroff   Number of Reps   3    Symptom Description   Pt reported dizziness incr. half "of a number" but not dramatically. Cues and demo for technique, 1.5/10 dizziness. Please see pt instructions for HEP details.             PT Education - 02/02/18 0919    Education Details  PT reviewed x1 viewing HEP. Provided habituation HEP and the importance of performing HEP. PT discussed the negative results of orthostatic hypotension.    Person(s) Educated  Patient    Methods  Explanation;Demonstration;Tactile cues;Verbal cues;Handout    Comprehension  Verbalized understanding;Returned demonstration;Need further instruction       PT Short Term Goals - 01/19/18 1450      PT SHORT TERM GOAL #1   Title  same as LTGs        PT Long Term Goals - 02/02/18 0931      PT LONG TERM GOAL #1   Title  Pt will be IND in HEP to reduce dizziness, improve balance, and strength. TARGET DATE FOR ALL LTGS: 02/16/18    Status  New      PT LONG TERM GOAL #2   Title  Perform FGA and write goal as indicated.     Status  Achieved      PT LONG TERM GOAL #3   Title  Pt will amb. outdoors, 500', over uneven terrain IND while performing head turns to improve safety during functional mobility.      Status  New      PT LONG TERM GOAL #4   Title  Pt will report baseline dizziness </=1/10 to improve QOL and safety during ADLs.     Status  New      PT LONG TERM GOAL #5   Title  Reassess positional  testing and write goals as indicated.     Baseline  s/s appear central in nature, will continue to monitor. Habitation HEP provided.     Status  Deferred      PT LONG TERM GOAL #6   Title  Pt will improve FGA score to >/=29/30 to decr. falls risk.     Status  New            Plan - 02/02/18 0929    Clinical Impression Statement  Pt's orthostatic hypotension was negative for significant decr. in BP but pt did report lightheadedness, which was mild. PT provided pt habituation HEP to decr. dizziness, as postional testing again revealed slight dizziness with downbeating nystagmus, this could be related to pt's hx of TIAs. He will f/u with neurologist next month. Pt continues to require extensive cues during all activities. Continue with POC.     Rehab Potential  Good    Clinical Impairments Affecting Rehab Potential  see above    PT Frequency  -- 2x/week for 2 weeks and then 1x/week for 2 weeks    PT Treatment/Interventions  ADLs/Self Care Home Management;Biofeedback;Canalith Repostioning;Therapeutic exercise;Manual techniques;Vestibular;Therapeutic activities;Functional mobility training;Stair training;Gait training;Patient/family education;DME Instruction;Neuromuscular re-education;Balance training    PT Next Visit Plan  Review habituation and x1 viewing prn. High level balance.     Consulted and Agree with Plan of Care  Patient       Patient will benefit from skilled therapeutic intervention in order to improve the following deficits and impairments:  Abnormal gait, Postural dysfunction, Dizziness, Decreased cognition, Decreased balance, Decreased mobility, Decreased knowledge of use of DME, Decreased strength(impaired strength based on gait deviations)  Visit Diagnosis: Dizziness and  giddiness  Other abnormalities of gait and mobility     Problem List Patient Active Problem List   Diagnosis Date Noted  . TIA (transient ischemic attack) 10/09/2016  . Expressive aphasia 10/09/2016  . Slurred speech 10/09/2016  . Coronary artery disease   . Acute idiopathic pericarditis   . Hypercholesterolemia   . Hypertension   . Vertigo, intermittent     Miller,Jennifer L 02/02/2018, 9:31 AM  [facility] [address] [place], [place], [address] Phone: [phone]   Fax:  [phone]  Name: Edward [name] MRN: [id number] Date of Birth: June 07, 1941  Zerita Boers, PT,DPT 02/02/18 9:32 AM Phone: [phone] Fax: [phone]

## 2018-02-04 ENCOUNTER — Encounter: Payer: Self-pay | Admitting: Physical Therapy

## 2018-02-04 ENCOUNTER — Ambulatory Visit: Payer: Medicare Other | Admitting: Physical Therapy

## 2018-02-04 DIAGNOSIS — R42 Dizziness and giddiness: Secondary | ICD-10-CM

## 2018-02-04 DIAGNOSIS — R2689 Other abnormalities of gait and mobility: Secondary | ICD-10-CM

## 2018-02-04 NOTE — Therapy (Signed)
[facility] [address] [place], [place], [address] Phone: [phone]   Fax:  [phone]  Physical Therapy Treatment  Patient Details  Name: Edward BeetsVictor X [name] MRN: [id number] Date of Birth: [birth year] Referring Provider: Dr. Ashley RoyaltyMatthews   Encounter Date: 02/04/2018  PT End of Session - 02/04/18 1305    Visit Number  4    Number of Visits  7    Date for PT Re-Evaluation  02/18/18    Authorization Type  [employer]HC Medicare    PT Start Time  0848    PT Stop Time  0932    PT Time Calculation (min)  44 min    Activity Tolerance  Patient tolerated treatment well    Behavior During Therapy  [facility] tasks assessed/performed       Past Medical History:  Diagnosis Date  . Arthritis   . At risk for sleep apnea    STOP-BANG = 4    SENT TO PCP 02-10-215  . BPH (benign prostatic hypertrophy)   . Coronary artery disease CARIOLOGIST-- DR Donnie AhoILLEY   ANGIOPLASTY TO LAD  . Elevated PSA   . Frequency of urination   . History of kidney stones   . Hyperlipidemia   . Hypertension   . Nocturia   . Spermatocele    left  . Urgency of urination   . Vertigo     Past Surgical History:  Procedure Laterality Date  . CORONARY ANGIOPLASTY  1995  DR [place]   LAD  . CYSTOSCOPY N/A 09/18/2015   Procedure: CYSTOSCOPY;  Surgeon: Jerilee FieldMatthew Eskridge, MD;  Location[facility];  Service: Urology;  Laterality: N/A;  . CYSTOSCOPY WITH RETROGRADE PYELOGRAM, URETEROSCOPY AND STENT PLACEMENT Left 03/01/2013   Procedure: CYSTOSCOPY WITH LEFT RETROGRADE PYELOGRAM, LEFT URETEROSCOPY and stent PLACEMENT;  Surgeon: Antony HasteMatthew Ramsey Eskridge, MD;  [facility];  Service: Urology;  Laterality: Left;  . FOOT FUSION Left    SECONDARY TO FX'S  . GREEN LIGHT LASER TURP (TRANSURETHRAL RESECTION OF PROSTATE N/A 10/04/2013   Procedure: GREEN LIGHT LASER TURP (TRANSURETHRAL RESECTION OF PROSTATE;  Surgeon: Antony HasteMatthew Ramsey Eskridge, MD;  Location[facility];   Service: Urology;  Laterality: N/A;  . INGUINAL HERNIA REPAIR Bilateral   . SHOULDER ARTHROSCOPY Right 2004  . SHOULDER ARTHROSCOPY WITH DEBRIDEMENT AND BICEP TENDON REPAIR Left 04-28-2011  . SPERMATOCELECTOMY Left 10/04/2013   Procedure: LEFT SPERMATOCELECTOMY;  Surgeon: Antony HasteMatthew Ramsey Eskridge, MD;  Location: [facility];  Service: Urology;  Laterality: Left;  . TRANSURETHRAL RESECTION OF PROSTATE N/A 12/11/2014   Procedure: CYSTOSCOPY, CLOT EVACUATION, WITH FULGURATION ;  Surgeon: Bjorn PippinJohn Wrenn, MD;  Location: [facility];  Service: Urology;  Laterality: N/A;  . TRANSURETHRAL RESECTION OF PROSTATE N/A 09/18/2015   Procedure: TRANSURETHRAL RESECTION OF THE PROSTATE (TURP);  Surgeon: Jerilee FieldMatthew Eskridge, MD;  Location: [facility];  Service: Urology;  Laterality: N/A;  . UMBILICAL HERNIA REPAIR  06-02-2008    There were no vitals filed for this visit.  Subjective Assessment - 02/04/18 0853    Subjective  "My name is Edward Pattenonya, do you believe that?"  Feels a little better but wants to have more consistent days of feeling better.  Reports the exercises are easy.  Wanting to discuss if he would need a eye lid tuck to help with vision.    Pertinent History  HTN, CAD, TIA, Acute pericarditis, HLD, vertigo, arthritis    Patient Stated Goals  To get rid of this issue (dizziness).     Currently  in Pain?  No/denies                        Vestibular Treatment/Exercise - 02/04/18 0856      Vestibular Treatment/Exercise   Vestibular Treatment Provided  Habituation    Habituation Exercises  Gari Crown Daroff   Number of Reps   1    Symptom Description   Reviewed correct sequence, stopping for 30 seconds in sitting before transitioning to opposite side.  Reports that when he performs it at home he does not feel a difference from repetition 1 and repetition 5.  Pt reports he is just "going through the motions".       X1 Viewing Horizontal   Foot Position  seated and standing feet apart     Reps  2    Comments  increased to 60 seconds while sitting; 30 seconds in standing      X1 Viewing Vertical   Foot Position  seated and standing feet apart    Reps  2    Comments  increased to 60 seconds while sitting; 30 seconds in standing         Balance Exercises - 02/04/18 1300      Balance Exercises: Standing   Tandem Gait  Forward;Retro;Intermittent upper extremity support;2 reps        PT Education - 02/04/18 1305    Education Details  progressed vestibular gaze adaptation and added tandem gait to HEP    Person(s) Educated  Patient    Methods  Explanation;Demonstration;Handout    Comprehension  Verbalized understanding;Returned demonstration       PT Short Term Goals - 01/19/18 1450      PT SHORT TERM GOAL #1   Title  same as LTGs        PT Long Term Goals - 02/02/18 0931      PT LONG TERM GOAL #1   Title  Pt will be IND in HEP to reduce dizziness, improve balance, and strength. TARGET DATE FOR ALL LTGS: 02/16/18    Status  New      PT LONG TERM GOAL #2   Title  Perform FGA and write goal as indicated.     Status  Achieved      PT LONG TERM GOAL #3   Title  Pt will amb. outdoors, 500', over uneven terrain IND while performing head turns to improve safety during functional mobility.     Status  New      PT LONG TERM GOAL #4   Title  Pt will report baseline dizziness </=1/10 to improve QOL and safety during ADLs.     Status  New      PT LONG TERM GOAL #5   Title  Reassess positional testing and write goals as indicated.     Baseline  s/s appear central in nature, will continue to monitor. Habitation HEP provided.     Status  Deferred      PT LONG TERM GOAL #6   Title  Pt will improve FGA score to >/=29/30 to decr. falls risk.     Status  New            Plan - 02/04/18 1307    Clinical Impression Statement  Due to pt reporting mild improvement in symptoms today focused session on review of habituation exercise and providing cues for correct  sequence, progression of gaze adaptation exercise and addition of standing balance  with narrow BOS to HEP.  With exercises pt reported mild symptoms that resolved quickly within a few seconds.  Will continue to progress towards LTG.    Rehab Potential  Good    Clinical Impairments Affecting Rehab Potential  see above    PT Frequency  -- 2x/week for 2 weeks and then 1x/week for 2 weeks    PT Treatment/Interventions  ADLs/Self Care Home Management;Biofeedback;Canalith Repostioning;Therapeutic exercise;Manual techniques;Vestibular;Therapeutic activities;Functional mobility training;Stair training;Gait training;Patient/family education;DME Instruction;Neuromuscular re-education;Balance training    PT Next Visit Plan  Progress x1 viewing in standing. Add High level balance to HEP.  May be able to D/C [facility] and Agree with Plan of Care  Patient       Patient will benefit from skilled therapeutic intervention in order to improve the following deficits and impairments:  Abnormal gait, Postural dysfunction, Dizziness, Decreased cognition, Decreased balance, Decreased mobility, Decreased knowledge of use of DME, Decreased strength(impaired strength based on gait deviations)  Visit Diagnosis: Dizziness and giddiness  Other abnormalities of gait and mobility     Problem List Patient Active Problem List   Diagnosis Date Noted  . TIA (transient ischemic attack) 10/09/2016  . Expressive aphasia 10/09/2016  . Slurred speech 10/09/2016  . Coronary artery disease   . Acute idiopathic pericarditis   . Hypercholesterolemia   . Hypertension   . Vertigo, intermittent    Dierdre Highman, PT, DPT 02/04/18    1:14 PM    [facility] [address] [place], [place], [address] Phone: [phone]   Fax:  [phone]  Name: Edward [name] MRN: [id number] Date of Birth: Jan 20, 1941

## 2018-02-04 NOTE — Patient Instructions (Addendum)
FOR LETTER EXERCISE:   - SITTING: increase time to 60 seconds  -STANDING: 30 seconds each   Feet Heel-Toe "Tandem"    Beside countertop, one hand close to counter top, walk a straight line bringing one foot directly in front of the other forwards and then backwards. Repeat for 4 laps.  2 times a day  Copyright  VHI. All rights reserved.

## 2018-02-05 ENCOUNTER — Encounter: Payer: Self-pay | Admitting: Internal Medicine

## 2018-02-08 ENCOUNTER — Encounter: Payer: Self-pay | Admitting: Rehabilitative and Restorative Service Providers"

## 2018-02-08 ENCOUNTER — Encounter: Payer: Self-pay | Admitting: Internal Medicine

## 2018-02-08 ENCOUNTER — Ambulatory Visit: Payer: Medicare Other | Admitting: Internal Medicine

## 2018-02-08 ENCOUNTER — Ambulatory Visit: Payer: Medicare Other | Admitting: Rehabilitative and Restorative Service Providers"

## 2018-02-08 VITALS — BP 122/74 | HR 78 | Ht 66.0 in | Wt 160.6 lb

## 2018-02-08 DIAGNOSIS — G459 Transient cerebral ischemic attack, unspecified: Secondary | ICD-10-CM

## 2018-02-08 DIAGNOSIS — H8113 Benign paroxysmal vertigo, bilateral: Secondary | ICD-10-CM

## 2018-02-08 DIAGNOSIS — R42 Dizziness and giddiness: Secondary | ICD-10-CM | POA: Diagnosis not present

## 2018-02-08 DIAGNOSIS — R2689 Other abnormalities of gait and mobility: Secondary | ICD-10-CM

## 2018-02-08 NOTE — Patient Instructions (Addendum)
Sit to Side-Lying    Sit on edge of bed. 1. Turn head 45 to right. 2. Maintain head position and lie down slowly on left side. Hold until symptoms subside, plus 30 seconds.. 3. Sit up slowly. Hold until symptoms subside, plus 30 seconds.. 4. Turn head 45 to left. 5. Maintain head position and lie down slowly on right side. Hold until symptoms subside, plus 30 seconds.. 6. Sit up slowly. Repeat sequence __3-5__ times per session. Do __1__ sessions per day.  Copyright  VHI. All rights reserved.   Gaze Stabilization: Sitting    Keeping eyes on target on [id number] away, tilt head down 15-30 and move head side to side for __20-30__ seconds. Repeat while moving head up and down for __20-30__ seconds. Do __2__ sessions per day.  Copyright  VHI. All rights reserved.   FOR LETTER EXERCISE:              - SITTING: increase time to 60 seconds             -STANDING: 30 seconds each   Feet Heel-Toe "Tandem"    Beside countertop, one hand close to counter top, walk a straight line bringing one foot directly in front of the other forwards and then backwards. Repeat for 4 laps.  2 times a day  Copyright  VHI. All rights reserved.   Side to Side Head Motion    Perform without assistive device. Walking on solid surface, turn head and eyes to left for __3_ steps. Then, turn head and eyes to opposite side for _3___ steps. Repeat sequence __5__ times per session. Do _1-2___ sessions per day. Repeat while at mall or grocery store. Repeat in dimly lit room.  Copyright  VHI. All rights reserved.    Feet Apart (Compliant Surface) Varied Arm Positions - Eyes Closed    Stand on compliant surface: _pillow___ with feet shoulder width apart and arms out. Close eyes and visualize upright position. Hold_30___ seconds. Repeat _3__ times per session. Do _2___ sessions per day.  Copyright  VHI. All rights reserved.

## 2018-02-08 NOTE — Progress Notes (Signed)
HPI Edward [name] is referred today by Dr. 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 The patient has been mostly healthy. He has HTN. He is retired. He has a h/o chronic vertigo. The patient has had 2 neuro events where he had aphasia. His symptoms resolved completely. He had a MR of the brain which was unrevealing. 2D echo revealed aortic calcification.  No Known Allergies   Current Outpatient Medications  Medication Sig Dispense Refill  . amLODipine (NORVASC) 10 MG tablet Take 5 mg by mouth daily.     . clopidogrel (PLAVIX) 75 MG tablet Take 1 tablet (75 mg total) by mouth daily. 30 tablet 0  . Coenzyme Q10 (CO Q 10 PO) Take 1 capsule by mouth daily.    . Glucosamine 500 MG CAPS Take 1 capsule by mouth daily.    Marland Kitchen losartan (COZAAR) 100 MG tablet Take 50 mg by mouth 2 (two) times daily.    . Omega-3 Fatty Acids (FISH OIL) 1000 MG CAPS Take 1 capsule by mouth daily.    . rosuvastatin (CRESTOR) 20 MG tablet Take 0.5 tablets (10 mg total) by mouth every evening. 30 tablet 0   No current facility-administered medications for this visit.      Past Medical History:  Diagnosis Date  . Arthritis   . At risk for sleep apnea    STOP-BANG = 4    SENT TO PCP 02-10-215  . BPH (benign prostatic hypertrophy)   . Coronary artery disease CARIOLOGIST-- DR Donnie Aho   ANGIOPLASTY TO LAD  . Elevated PSA   . Frequency of urination   . History of kidney stones   . Hyperlipidemia   . Hypertension   . Nocturia   . Spermatocele    left  . Urgency of urination   . Vertigo     ROS:   All systems reviewed and negative except as noted in the HPI.   Past Surgical History:  Procedure Laterality Date  . CORONARY ANGIOPLASTY  1995  DR [place]   LAD  . CYSTOSCOPY N/A 09/18/2015   Procedure: CYSTOSCOPY;  Surgeon: Jerilee Field, MD;  Location: [facility];  Service: Urology;  Laterality: N/A;  . CYSTOSCOPY WITH RETROGRADE PYELOGRAM, URETEROSCOPY AND STENT  PLACEMENT Left 03/01/2013   Procedure: CYSTOSCOPY WITH LEFT RETROGRADE PYELOGRAM, LEFT URETEROSCOPY and stent PLACEMENT;  Surgeon: Antony Haste, MD;  Location: [facility];  Service: Urology;  Laterality: Left;  . FOOT FUSION Left    SECONDARY TO FX'S  . GREEN LIGHT LASER TURP (TRANSURETHRAL RESECTION OF PROSTATE N/A 10/04/2013   Procedure: GREEN LIGHT LASER TURP (TRANSURETHRAL RESECTION OF PROSTATE;  Surgeon: Antony Haste, MD;  Location: [facility];  Service: Urology;  Laterality: N/A;  . INGUINAL HERNIA REPAIR Bilateral   . SHOULDER ARTHROSCOPY Right 2004  . SHOULDER ARTHROSCOPY WITH DEBRIDEMENT AND BICEP TENDON REPAIR Left 04-28-2011  . SPERMATOCELECTOMY Left 10/04/2013   Procedure: LEFT SPERMATOCELECTOMY;  Surgeon: Antony Haste, MD;  Location: [facility];  Service: Urology;  Laterality: Left;  . TRANSURETHRAL RESECTION OF PROSTATE N/A 12/11/2014   Procedure: CYSTOSCOPY, CLOT EVACUATION, WITH FULGURATION ;  Surgeon: Bjorn Pippin, MD;  Location: [facility];  Service: Urology;  Laterality: N/A;  . TRANSURETHRAL RESECTION OF PROSTATE N/A 09/18/2015   Procedure: TRANSURETHRAL RESECTION OF THE PROSTATE (TURP);  Surgeon: Jerilee Field, MD;  Location[facility];  Service: Urology;  Laterality: N/A;  . UMBILICAL HERNIA REPAIR  06-02-2008  No family history on file.   Social History   Socioeconomic History  . Marital status: Married    Spouse name: Not on file  . Number of children: 0  . Years of education: Not on file  . Highest education level: Not on file  Occupational History  . 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 Financial resource strain: Not on file  . Food insecurity:    Worry: Not on file    Inability: Not on file  . Transportation needs:    Medical: Not on file    Non-medical: Not on file  Tobacco Use  . Smoking status: Former Smoker    Packs/day: 0.50    Years: 3.00    Pack years: 1.50    Types:  Cigarettes    Last attempt to quit: 12/18/1970    Years since quitting: 47.1  . Smokeless tobacco: Never Used  Substance and Sexual Activity  . Alcohol use: Yes    Comment: OCCASIONAL  . Drug use: No  . Sexual activity: Not on file  Lifestyle  . Physical activity:    Days per week: Not on file    Minutes per session: Not on file  . Stress: Not on file  Relationships  . Social connections:    Talks on phone: Not on file    Gets together: Not on file    Attends religious service: Not on file    Active member of club or organization: Not on file    Attends meetings of clubs or organizations: Not on file    Relationship status: Not on file  . Intimate partner violence:    Fear of current or ex partner: Not on file    Emotionally abused: Not on file    Physically abused: Not on file    Forced sexual activity: Not on file  Other Topics Concern  . Not on file  Social History Narrative  . Not on file     BP 122/74   Pulse 78   Ht 5\' 6"  (1.676 m)   Wt 160 lb 9.6 oz (72.8 kg)   BMI 25.92 kg/m   Physical Exam:  Well appearing NAD HEENT: Unremarkable Neck:  No JVD, no thyromegally Lymphatics:  No adenopathy Back:  No CVA tenderness Lungs:  Clear with no wheezes HEART:  Regular rate rhythm, no murmurs, no rubs, no clicks Abd:  soft, positive bowel sounds, no organomegally, no rebound, no guarding Ext:  2 plus pulses, no edema, no cyanosis, no clubbing Skin:  No rashes no nodules Neuro:  CN II through XII intact, motor grossly intact  EKG - NSR  Assess/Plan: 1. TIA - I have discussed insertion of an ILR if TEE is unrevealing. The indications/risks/benefits/goals/expectations of the procedure were reviewed and he wishes to proceed. I spent 40 minutes with 50% face to face time with the patient producing this note.  Lewayne BuntingGregg Foster Frericks, M.D.

## 2018-02-08 NOTE — Patient Instructions (Signed)
Medication Instructions:  Your physician recommends that you continue on your current medications as directed. Please refer to the Current Medication list given to you today.  Labwork: None ordered.  Testing/Procedures: Your physician has requested that you have a TEE. During a TEE, sound waves are used to create images of your heart. It provides your doctor with information about the size and shape of your heart and how well your heart's chambers and valves are working. In this test, a transducer is attached to the end of a flexible tube that's guided down your throat and into your esophagus (the tube leading from you mouth to your stomach) to get a more detailed image of your heart. You are not awake for the procedure. Please see the instruction sheet given to you today. For further information please visit [email or web address].  Your physician has requested that you have an implantable loop recorder placed.  An implantable loop recorder is a small electronic device that is placed under the skin of your chest. It is about the size of an AA ("double A") battery. The device records the electrical activity of your heart over a long period of time. Your health care provider can download these recordings to monitor your heart. You may need an implantable loop recorder if you have periods of abnormal heart activity (arrhythmias) or unexplained fainting (syncope) caused by a heart problem.  Follow-Up: You will follow up with the device clinic 7-10 days after your procedure for a wound check.  You will follow up with Dr. Ladona Ridgelaylor 91 days after your procedure.   INSTRUCTIONS FOR TEE:  You are scheduled for a TEE on February 16, 2018 with Dr. Rennis GoldenHilty.  Please [facility]: [name] [place], [place] [address] at 8:00 am.  DIET: Nothing to eat or drink after midnight except a sip of water with medications (see medication instructions below)  Medication  Instructions: Take all your morning medications with a sip of water.  You must have a responsible person to drive you home and stay in the waiting area during your procedure. Failure to do so could result in cancellation.  Bring your insurance cards.  *Special Note: Every effort is made to have your procedure done on time. Occasionally there are emergencies that occur at the hospital that may cause delays. Please be patient if a delay does occur.    If you need a refill on your cardiac medications before your next appointment, please call your pharmacy.     Transesophageal Echocardiogram Transesophageal echocardiography (TEE) is a picture test of your heart using sound waves. The pictures taken can give very detailed pictures of your heart. This can help your doctor see if there are problems with your heart. TEE can check:  If your heart has blood clots in it.  How well your heart valves are working.  If you have an infection on the inside of your heart.  Some of the major arteries of your heart.  If your heart valve is working after a Psychologist, forensicrepair.  Your heart before a procedure that uses a shock to your heart to get the rhythm back to normal.  What happens before the procedure?  Do not eat or drink for 6 hours before the procedure or as told by your doctor.  Make plans to have someone drive you home after the procedure. Do not drive yourself home.  An IV tube will be put in your arm. What happens  during the procedure?  You will be given a medicine to help you relax (sedative). It will be given through the IV tube.  A numbing medicine will be sprayed or gargled in the back of your throat to help numb it.  The tip of the probe is placed into the back of your mouth. You will be asked to swallow. This helps to pass the probe into your esophagus.  Once the tip of the probe is in the right place, your doctor can take pictures of your heart.  You may feel pressure at the back of  your throat. What happens after the procedure?  You will be taken to a recovery area so the sedative can wear off.  Your throat may be sore and scratchy. This will go away slowly over time.  You will go home when you are fully awake and able to swallow liquids.  You should have someone stay with you for the next 24 hours.  Do not drive or operate machinery for the next 24 hours. This information is not intended to replace advice given to you by your health care provider. Make sure you discuss any questions you have with your health care provider. Document Released: 06/01/2009 Document Revised: 01/10/2016 Document Reviewed: 02/03/2013 Elsevier Interactive Patient Education  2018 Elsevier Inc.    Implantable Loop Recorder Placement An implantable loop recorder is a small electronic device that is placed under the skin of your chest. It is about the size of an AA ("double A") battery. The device records the electrical activity of your heart over a long period of time. Your health care provider can download these recordings to monitor your heart. You may need an implantable loop recorder if you have periods of abnormal heart activity (arrhythmias) or unexplained fainting (syncope) caused by a heart problem. Tell a health care provider about:  Any allergies you have.  All medicines you are taking, including vitamins, herbs, eye drops, creams, and over-the-counter medicines.  Any problems you or family members have had with anesthetic medicines.  Any blood disorders you have.  Any surgeries you have had.  Any medical conditions you have.  Whether you are pregnant or may be pregnant. What are the risks? Generally, this is a safe procedure. However, as with any procedure, problems may occur, including:  Infection.  Bleeding.  Allergic reactions to anesthetic medicines.  Damage to nerves or blood vessels.  Failure of the device to work. This could require another surgery to  replace it.  What happens before the procedure?   You may have a physical exam, blood tests, and imaging tests of your heart, such as a chest X-ray.  Follow instructions from your health care provider about eating or drinking restrictions.  Ask your health care provider about: ? Changing or stopping your regular medicines. This is especially important if you are taking diabetes medicines or blood thinners. ? Taking medicines such as aspirin and ibuprofen. These medicines can thin your blood. Do not take these medicines before your procedure if your surgeon instructs you not to.  Ask your health care provider how your surgical site will be marked or identified.  You may be given antibiotic medicine to help prevent infection.  Plan to have someone take you home after the procedure.  If you will be going home right after the procedure, plan to have someone with you for 24 hours.  Do not use any tobacco products, such as cigarettes, chewing tobacco, and e-cigarettes as told by your  Careers adviser. If you need help quitting, ask your health care provider. What happens during the procedure?  To reduce your risk of infection: ? Your health care team will wash or sanitize their hands. ? Your skin will be washed with soap.  An IV tube will be inserted into one of your veins.  You may be given an antibiotic medicine through the IV tube.  You may be given one or more of the following: ? A medicine to help you relax (sedative). ? A medicine to numb the area (local anesthetic).  A small cut (incision) will be made on the left side of your upper chest.  A pocket will be created under your skin.  The device will be placed in the pocket.  The incision will be closed with stitches (sutures) or adhesive strips.  A bandage (dressing) will be placed over the incision. The procedure may vary among health care providers and hospitals. What happens after the procedure?  Your blood pressure, heart  rate, breathing rate, and blood oxygen level will be monitored often until the medicines you were given have worn off.  You may be able to go home on the day of your surgery. Before going home: ? Your health care provider will program your recorder. ? You will learn how to trigger your device with a handheld activator. ? You will learn how to send recordings to your health care provider. ? You will get an ID card for your device, and you will be told when to use it.  Do not drive for 24 hours if you received a sedative. This information is not intended to replace advice given to you by your health care provider. Make sure you discuss any questions you have with your health care provider. Document Released: 07/16/2015 Document Revised: 01/10/2016 Document Reviewed: 05/09/2015 Elsevier Interactive Patient Education  [employer].

## 2018-02-08 NOTE — Therapy (Signed)
[facility] [address] [place], [place], [address] Phone: [phone]   Fax:  [phone]  Physical Therapy Treatment  Patient Details  Name: Edward [name] MRN: [id number] Date of Birth: 12/18/1940 Referring Provider: Dr. Ashley Royalty   Encounter Date: 02/08/2018  PT End of Session - 02/08/18 1429    Visit Number  5    Number of Visits  7    Date for PT Re-Evaluation  02/18/18    Authorization Type  [employer] Medicare    PT Start Time  1155    PT Stop Time  1234    PT Time Calculation (min)  39 min    Activity Tolerance  Patient tolerated treatment well    Behavior During Therapy  [facility] for tasks assessed/performed       Past Medical History:  Diagnosis Date  . Arthritis   . At risk for sleep apnea    STOP-BANG = 4    SENT TO PCP 02-10-215  . BPH (benign prostatic hypertrophy)   . Coronary artery disease CARIOLOGIST-- DR Donnie Aho   ANGIOPLASTY TO LAD  . Elevated PSA   . Frequency of urination   . History of kidney stones   . Hyperlipidemia   . Hypertension   . Nocturia   . Spermatocele    left  . Urgency of urination   . Vertigo     Past Surgical History:  Procedure Laterality Date  . CORONARY ANGIOPLASTY  1995  DR [place]   LAD  . CYSTOSCOPY N/A 09/18/2015   Procedure: CYSTOSCOPY;  Surgeon: Jerilee Field, MD;  Location: [facility];  Service: Urology;  Laterality: N/A;  . CYSTOSCOPY WITH RETROGRADE PYELOGRAM, URETEROSCOPY AND STENT PLACEMENT Left 03/01/2013   Procedure: CYSTOSCOPY WITH LEFT RETROGRADE PYELOGRAM, LEFT URETEROSCOPY and stent PLACEMENT;  Surgeon: Antony Haste, MD;  Location: [facility];  Service: Urology;  Laterality: Left;  . FOOT FUSION Left    SECONDARY TO FX'S  . GREEN LIGHT LASER TURP (TRANSURETHRAL RESECTION OF PROSTATE N/A 10/04/2013   Procedure: GREEN LIGHT LASER TURP (TRANSURETHRAL RESECTION OF PROSTATE;  Surgeon: Antony Haste, MD;  Location[facility];   Service: Urology;  Laterality: N/A;  . INGUINAL HERNIA REPAIR Bilateral   . SHOULDER ARTHROSCOPY Right 2004  . SHOULDER ARTHROSCOPY WITH DEBRIDEMENT AND BICEP TENDON REPAIR Left 04-28-2011  . SPERMATOCELECTOMY Left 10/04/2013   Procedure: LEFT SPERMATOCELECTOMY;  Surgeon: Antony Haste, MD;  [facility];  Service: Urology;  Laterality: Left;  . TRANSURETHRAL RESECTION OF PROSTATE N/A 12/11/2014   Procedure: CYSTOSCOPY, CLOT EVACUATION, WITH FULGURATION ;  Surgeon: Bjorn Pippin, MD;  Location[facility];  Service: Urology;  Laterality: N/A;  . TRANSURETHRAL RESECTION OF PROSTATE N/A 09/18/2015   Procedure: TRANSURETHRAL RESECTION OF THE PROSTATE (TURP);  Surgeon: Jerilee Field, MD;  [facility];  Service: Urology;  Laterality: N/A;  . UMBILICAL HERNIA REPAIR  06-02-2008    There were no vitals filed for this visit.  Subjective Assessment - 02/08/18 1156    Subjective  Exercises are going okay at home.  "I try to follow instructions as best I can."  He notes that he thinks dizziness is improving somewhat, but too early to tell.     Pertinent History  HTN, CAD, TIA, Acute pericarditis, HLD, vertigo, arthritis    Patient Stated Goals  To get rid of this issue (dizziness).     Currently in Pain?  No/denies  Vestibular Assessment - 02/08/18 1158      Vestibular Assessment   General Observation  The patient notes he gets some mild dizziness with brandt daroff.  PT recommended he continue.  Symptoms go away quickly.  He notes a 1/10 dizziness at "baseline"      Positional Testing   Dix-Hallpike  Dix-Hallpike Right;Dix-Hallpike Left    Horizontal Canal Testing  Horizontal Canal Right;Horizontal Canal Left      Dix-Hallpike Right   Dix-Hallpike Right Duration  0    Dix-Hallpike Right Symptoms  No nystagmus      Dix-Hallpike Left   Dix-Hallpike Left Duration  0    Dix-Hallpike Left Symptoms  No nystagmus      Horizontal Canal Right   Horizontal Canal  Right Duration  0    Horizontal Canal Right Symptoms  Normal      Horizontal Canal Left   Horizontal Canal Left Duration  0    Horizontal Canal Left Symptoms  Normal               [facility] Adult PT Treatment/Exercise - 02/08/18 1430      Ambulation/Gait   Ambulation/Gait  Yes    Ambulation/Gait Assistance  6: Modified independent (Device/Increase time);4: Min guard    Ambulation/Gait Assistance Details  The patient ambulates into clinic mod indep on level surfaces.  Performed dynamic gait activities including tandem gait near wall with CGA, gait with horizontal head motion every 3-4 steps (added to HEP) with CGA x 50 feet x 4 reps, forward/backwards walking    Ambulation Distance (Feet)  300 Feet    Assistive device  None    Gait Pattern  Step-through pattern;Decreased stride length;Decreased trunk rotation    Ambulation Surface  Level;Indoor      Neuro Re-ed    Neuro Re-ed Details   Corner balance exercises including foam standing with feet apart + eyes closed progressing to feet together (modified for home to begin with feet apart and move more narrow as able).  Then performed pillow stand with eyes open and head motion horiozntal plane x 10 reps.        Vestibular Treatment/Exercise - 02/08/18 1434      Vestibular Treatment/Exercise   Vestibular Treatment Provided  Gaze;Habituation    Habituation Exercises  Austin Miles    Gaze Exercises  X1 Viewing Horizontal;X1 Viewing Vertical      Austin Miles   Number of Reps   3    Symptom Description   patient and PT reviewed for HEP; recommended he continue due to continued "mild" sensation of dizziness.  Began at 1/10 and increases to 1.5/10 per patient report.      X1 Viewing Horizontal   Foot Position  Standing recommending patient work up to 60 seconds.      Comments  40 seconds with feedback on amplitude of movement and on speed of movement      X1 Viewing Vertical   Foot Position  standing     Comments  30 seconds  discussing increased speed to tolerance            PT Education - 02/08/18 1429    Education Details  reviewed VOR progression, brandt daroff, tandem gait.  Added corner standing on pillow + eyes closed and dynamic gait iwth head motion.    Person(s) Educated  Patient    Methods  Explanation;Demonstration;Handout    Comprehension  Returned demonstration;Verbalized understanding       PT Short Term Goals - 01/19/18  1450      PT SHORT TERM GOAL #1   Title  same as LTGs        PT Long Term Goals - 02/02/18 0931      PT LONG TERM GOAL #1   Title  Pt will be IND in HEP to reduce dizziness, improve balance, and strength. TARGET DATE FOR ALL LTGS: 02/16/18    Status  New      PT LONG TERM GOAL #2   Title  Perform FGA and write goal as indicated.     Status  Achieved      PT LONG TERM GOAL #3   Title  Pt will amb. outdoors, 500', over uneven terrain IND while performing head turns to improve safety during functional mobility.     Status  New      PT LONG TERM GOAL #4   Title  Pt will report baseline dizziness </=1/10 to improve QOL and safety during ADLs.     Status  New      PT LONG TERM GOAL #5   Title  Reassess positional testing and write goals as indicated.     Baseline  s/s appear central in nature, will continue to monitor. Habitation HEP provided.     Status  Deferred      PT LONG TERM GOAL #6   Title  Pt will improve FGA score to >/=29/30 to decr. falls risk.     Status  New            Plan - 02/08/18 1435    Clinical Impression Statement  The patient tolerated addition of dynamic gait and compliant surface standing to home.  PT recommended he continue current plan working to LTGs.  Will assess at next visit and determine renewal versus d/c.     PT Treatment/Interventions  ADLs/Self Care Home Management;Biofeedback;Canalith Repostioning;Therapeutic exercise;Manual techniques;Vestibular;Therapeutic activities;Functional mobility training;Stair training;Gait  training;Patient/family education;DME Instruction;Neuromuscular re-education;Balance training    PT Next Visit Plan  check brandt daroff, review HEP, assess LTGs and determine renewal versus d/c    Consulted and Agree with Plan of Care  Patient       Patient will benefit from skilled therapeutic intervention in order to improve the following deficits and impairments:  Abnormal gait, Postural dysfunction, Dizziness, Decreased cognition, Decreased balance, Decreased mobility, Decreased knowledge of use of DME, Decreased strength  Visit Diagnosis: Dizziness and giddiness  Other abnormalities of gait and mobility  BPPV (benign paroxysmal positional vertigo), bilateral     Problem List Patient Active Problem List   Diagnosis Date Noted  . TIA (transient ischemic attack) 10/09/2016  . Expressive aphasia 10/09/2016  . Slurred speech 10/09/2016  . Coronary artery disease   . Acute idiopathic pericarditis   . Hypercholesterolemia   . Hypertension   . Vertigo, intermittent     Leanndra Pember, PT 02/08/2018, 2:37 PM  [facility] [address] [place], [place], [address] Phone: [phone]   Fax:  [phone]  Name: Edward [name] MRN: [id number] Date of Birth: 10/01/1940

## 2018-02-15 ENCOUNTER — Encounter: Payer: Self-pay | Admitting: Rehabilitative and Restorative Service Providers"

## 2018-02-15 ENCOUNTER — Ambulatory Visit: Payer: Medicare Other | Attending: Internal Medicine | Admitting: Rehabilitative and Restorative Service Providers"

## 2018-02-15 DIAGNOSIS — H8113 Benign paroxysmal vertigo, bilateral: Secondary | ICD-10-CM | POA: Diagnosis present

## 2018-02-15 DIAGNOSIS — R42 Dizziness and giddiness: Secondary | ICD-10-CM

## 2018-02-15 DIAGNOSIS — R2689 Other abnormalities of gait and mobility: Secondary | ICD-10-CM | POA: Insufficient documentation

## 2018-02-16 ENCOUNTER — Encounter (HOSPITAL_COMMUNITY)
Admission: RE | Disposition: A | Discharge status: Home / Self Care | Payer: Self-pay | Source: Ambulatory Visit | Attending: Internal Medicine

## 2018-02-16 ENCOUNTER — Encounter (HOSPITAL_COMMUNITY): Payer: Self-pay | Admitting: *Deleted

## 2018-02-16 ENCOUNTER — Ambulatory Visit (HOSPITAL_BASED_OUTPATIENT_CLINIC_OR_DEPARTMENT_OTHER): Payer: Medicare Other

## 2018-02-16 ENCOUNTER — Ambulatory Visit (HOSPITAL_COMMUNITY)
Admission: RE | Admit: 2018-02-16 | Discharge: 2018-02-16 | Disposition: A | Discharge status: Home / Self Care | Payer: Medicare Other | Source: Ambulatory Visit | Attending: Internal Medicine | Admitting: Internal Medicine

## 2018-02-16 ENCOUNTER — Other Ambulatory Visit: Payer: Self-pay

## 2018-02-16 DIAGNOSIS — I351 Nonrheumatic aortic (valve) insufficiency: Secondary | ICD-10-CM

## 2018-02-16 DIAGNOSIS — E785 Hyperlipidemia, unspecified: Secondary | ICD-10-CM | POA: Insufficient documentation

## 2018-02-16 DIAGNOSIS — Q211 Atrial septal defect: Secondary | ICD-10-CM | POA: Diagnosis not present

## 2018-02-16 DIAGNOSIS — I2510 Atherosclerotic heart disease of native coronary artery without angina pectoris: Secondary | ICD-10-CM | POA: Diagnosis not present

## 2018-02-16 DIAGNOSIS — G459 Transient cerebral ischemic attack, unspecified: Secondary | ICD-10-CM | POA: Insufficient documentation

## 2018-02-16 DIAGNOSIS — I10 Essential (primary) hypertension: Secondary | ICD-10-CM | POA: Insufficient documentation

## 2018-02-16 HISTORY — PX: TEE WITHOUT CARDIOVERSION: SHX5443

## 2018-02-16 SURGERY — LOOP RECORDER INSERTION

## 2018-02-16 SURGERY — ECHOCARDIOGRAM, TRANSESOPHAGEAL
Anesthesia: Moderate Sedation

## 2018-02-16 MED ORDER — LIDOCAINE VISCOUS HCL 2 % MT SOLN
OROMUCOSAL | Status: DC | PRN
  Administered 2018-02-16: 20 mL via OROMUCOSAL

## 2018-02-16 MED ORDER — SODIUM CHLORIDE 0.9 % IV SOLN
INTRAVENOUS | Status: DC
  Administered 2018-02-16: 08:00:00 via INTRAVENOUS

## 2018-02-16 MED ORDER — LIDOCAINE VISCOUS HCL 2 % MT SOLN
OROMUCOSAL | Status: AC
  Filled 2018-02-16: qty 15

## 2018-02-16 MED ORDER — FENTANYL CITRATE (PF) 100 MCG/2ML IJ SOLN
INTRAMUSCULAR | Status: AC
  Filled 2018-02-16: qty 2

## 2018-02-16 MED ORDER — MIDAZOLAM HCL 5 MG/ML IJ SOLN
INTRAMUSCULAR | Status: AC
  Filled 2018-02-16: qty 2

## 2018-02-16 MED ORDER — MIDAZOLAM HCL 5 MG/5ML IJ SOLN
INTRAMUSCULAR | Status: DC | PRN
  Administered 2018-02-16: 1 mg via INTRAVENOUS
  Administered 2018-02-16: 2 mg via INTRAVENOUS
  Administered 2018-02-16: 1 mg via INTRAVENOUS
  Administered 2018-02-16: 2 mg via INTRAVENOUS
  Administered 2018-02-16: 1 mg via INTRAVENOUS

## 2018-02-16 MED ORDER — FENTANYL CITRATE (PF) 100 MCG/2ML IJ SOLN
INTRAMUSCULAR | Status: DC | PRN
  Administered 2018-02-16 (×2): 25 ug via INTRAVENOUS

## 2018-02-16 MED ORDER — LIDOCAINE-EPINEPHRINE 1 %-1:100000 IJ SOLN
INTRAMUSCULAR | Status: AC
  Filled 2018-02-16: qty 1

## 2018-02-16 NOTE — H&P (Signed)
   INTERVAL PROCEDURE H&P  History and Physical Interval Note:  02/16/2018 7:52 AM  Edward [name] has presented today for their planned procedure. The various methods of treatment have been discussed with the patient and family. After consideration of risks, benefits and other options for treatment, the patient has consented to the procedure.  The patients' outpatient history has been reviewed, patient examined, and no change in status from most recent office note within the past 30 days. I have reviewed the patients' chart and labs and will proceed as planned. Questions were answered to the patient's satisfaction.   Chrystie NoseKenneth C. Hilty, MD, [facility] CenterFACC, FACP  [place]  [facility]  Medical Director of the Advanced Lipid Disorders &  Cardiovascular Risk Reduction Clinic Diplomate of the American Board of Clinical Lipidology Attending Cardiologist  Direct Dial: [phone]  Fax: [phone]  Website:  [email or web address]  Kenneth C Hilty 02/16/2018, 7:52 AM

## 2018-02-16 NOTE — Therapy (Signed)
[facility] [address] [place], [place], [address] Phone: [phone]   Fax:  [phone]  Physical Therapy Treatment  Patient Details  Name: Edward [name] MRN: [id number] Date of Birth: [birth year] Referring Provider: Dr. Zigmund Daniel   Encounter Date: 02/15/2018  PT End of Session - 02/15/18 1424    Visit Number  6    Number of Visits  7    Date for PT Re-Evaluation  02/18/18    Authorization Type  [employer] Medicare    PT Start Time  0737    PT Stop Time  1446    PT Time Calculation (min)  39 min    Activity Tolerance  Patient tolerated treatment well    Behavior During Therapy  [facility] for tasks assessed/performed       Past Medical History:  Diagnosis Date  . Arthritis   . At risk for sleep apnea    STOP-BANG = 4    SENT TO PCP 02-10-215  . BPH (benign prostatic hypertrophy)   . Coronary artery disease CARIOLOGIST-- DR Wynonia Lawman   ANGIOPLASTY TO LAD  . Elevated PSA   . Frequency of urination   . History of kidney stones   . Hyperlipidemia   . Hypertension   . Nocturia   . Spermatocele    left  . Urgency of urination   . Vertigo     Past Surgical History:  Procedure Laterality Date  . CORONARY ANGIOPLASTY  1995  DR [place]   LAD  . CYSTOSCOPY N/A 09/18/2015   Procedure: CYSTOSCOPY;  Surgeon: Festus Aloe, MD;  Location: [facility];  Service: Urology;  Laterality: N/A;  . CYSTOSCOPY WITH RETROGRADE PYELOGRAM, URETEROSCOPY AND STENT PLACEMENT Left 03/01/2013   Procedure: CYSTOSCOPY WITH LEFT RETROGRADE PYELOGRAM, LEFT URETEROSCOPY and stent PLACEMENT;  Surgeon: Fredricka Bonine, MD;  Location: [facility];  Service: Urology;  Laterality: Left;  . FOOT FUSION Left    SECONDARY TO FX'S  . GREEN LIGHT LASER TURP (TRANSURETHRAL RESECTION OF PROSTATE N/A 10/04/2013   Procedure: GREEN LIGHT LASER TURP (TRANSURETHRAL RESECTION OF PROSTATE;  Surgeon: Fredricka Bonine, MD;  Location: [facility];   Service: Urology;  Laterality: N/A;  . INGUINAL HERNIA REPAIR Bilateral   . SHOULDER ARTHROSCOPY Right 2004  . SHOULDER ARTHROSCOPY WITH DEBRIDEMENT AND BICEP TENDON REPAIR Left 04-28-2011  . SPERMATOCELECTOMY Left 10/04/2013   Procedure: LEFT SPERMATOCELECTOMY;  Surgeon: Fredricka Bonine, MD;  Location: [facility];  Service: Urology;  Laterality: Left;  . TRANSURETHRAL RESECTION OF PROSTATE N/A 12/11/2014   Procedure: CYSTOSCOPY, CLOT EVACUATION, WITH FULGURATION ;  Surgeon: Irine Seal, MD;  Location: [facility];  Service: Urology;  Laterality: N/A;  . TRANSURETHRAL RESECTION OF PROSTATE N/A 09/18/2015   Procedure: TRANSURETHRAL RESECTION OF THE PROSTATE (TURP);  Surgeon: Festus Aloe, MD;  Location: [facility];  Service: Urology;  Laterality: N/A;  . UMBILICAL HERNIA REPAIR  06-02-2008    There were no vitals filed for this visit.  Subjective Assessment - 02/15/18 1410    Subjective  The patient reports that he is tolerating exercises well.  He still notes intermittent episodes of 1.5/10 type of lightheadedness with moving (rocking sensation).    Patient reports a sensation of walking around in a "daze".      Pertinent History  HTN, CAD, TIA, Acute pericarditis, HLD, vertigo, arthritis    Patient Stated Goals  To get rid of this issue (dizziness).     Currently in Pain?  No/denies  [facility] PT Assessment - 02/15/18 1435      Functional Gait  Assessment   Gait assessed   Yes    Gait Level Surface  Walks 20 ft in less than 7 sec but greater than 5.5 sec, uses assistive device, slower speed, mild gait deviations, or deviates 6-10 in outside of the 12 in walkway width.    Change in Gait Speed  Able to smoothly change walking speed without loss of balance or gait deviation. Deviate no more than 6 in outside of the 12 in walkway width.    Gait with Horizontal Head Turns  Performs head turns smoothly with no change in gait. Deviates no more than 6 in outside 12 in walkway width    Gait with  Vertical Head Turns  Performs task with slight change in gait velocity (eg, minor disruption to smooth gait path), deviates 6 - 10 in outside 12 in walkway width or uses assistive device    Gait and Pivot Turn  Pivot turns safely within 3 sec and stops quickly with no loss of balance.    Step Over Obstacle  Is able to step over 2 stacked shoe boxes taped together (9 in total height) without changing gait speed. No evidence of imbalance.    Gait with Narrow Base of Support  Ambulates 7-9 steps.    Gait with Eyes Closed  Walks 20 ft, no assistive devices, good speed, no evidence of imbalance, normal gait pattern, deviates no more than 6 in outside 12 in walkway width. Ambulates 20 ft in less than 7 sec.    Ambulating Backwards  Walks 20 ft, no assistive devices, good speed, no evidence for imbalance, normal gait    Steps  Alternating feet, no rail.    Total Score  27    FGA comment:  27/30                   [facility] Adult PT Treatment/Exercise - 02/16/18 0001      Ambulation/Gait   Ambulation/Gait  Yes    Ambulation/Gait Assistance  6: Modified independent (Device/Increase time)    Ambulation/Gait Assistance Details  Dynamic gait activities including reviewing HEP of gait with horizontal head motion with PT encouraging head turns every 3 steps (versus every step) for improved performance.    Ambulation Distance (Feet)  200 Feet    Assistive device  None    Ambulation Surface  Level;Unlevel;Indoor;Outdoor    Curb  6: Modified independent (Device/increase time)    Gait Comments  Ambulation outdoors on paved sidewalks, grassy surfaces, curbs mod indep.  Patient reports mowing his yard earlier today with push mower.      Self-Care   Self-Care  Other Self-Care Comments    Other Self-Care Comments   Discussed home exercise program and provocation of symptoms.  Patient notes a mild sense of visual blurring with gaze adaptation.  PT recommended patient continue.  He also notes workup from  cardiologist to look at other contributing factors to lightheadedness-- patient would like to continue HEP independently and f/u with PT only if needed.       Neuro Re-ed    Neuro Re-ed Details   Reviewed gaze adaptation x 1 viewing, reviewed corner balance on foam with eyes closed.  Tandem gait activities, single leg stance with emphasis on L leg stance due to h/o ankle injury.              PT Education - 02/15/18 1529    Education Details  Patient to continue HEP and call to return if needed (he does not wish to schedule a 1 month f/u when asked)    Person(s) Educated  Patient    Methods  Explanation;Demonstration;Handout    Comprehension  Verbalized understanding;Returned demonstration       PT Short Term Goals - 01/19/18 1450      PT SHORT TERM GOAL #1   Title  same as LTGs        PT Long Term Goals - 02/15/18 1411      PT LONG TERM GOAL #1   Title  Pt will be IND in HEP to reduce dizziness, improve balance, and strength. TARGET DATE FOR ALL LTGS: 02/16/18    Status  Achieved      PT LONG TERM GOAL #2   Title  Perform FGA and write goal as indicated.     Baseline  21/30 FGA    Status  Achieved      PT LONG TERM GOAL #3   Title  Pt will amb. outdoors, 500', over uneven terrain IND while performing head turns to improve safety during functional mobility.     Baseline  Met per patient report with patient noting he mows his yard, scans environment, etc.  Did not perform during session due to >90 degree heat outdoors.    Status  Achieved      PT LONG TERM GOAL #4   Title  Pt will report baseline dizziness </=1/10 to improve QOL and safety during ADLs.     Baseline  Baseline dizziness today is 1/10    Status  Achieved      PT LONG TERM GOAL #5   Title  Reassess positional testing and write goals as indicated.     Baseline  s/s appear central in nature, will continue to monitor. Habitation HEP provided.     Status  Deferred      PT LONG TERM GOAL #6   Title  Pt will  improve FGA score to >/=29/30 to decr. falls risk.     Baseline  27/30 on 02/15/2018    Status  Partially Met            Plan - 02/15/18 1530    Clinical Impression Statement  The patient has partially met LTGs.  He continues with lightheadedness and is undergoing further medical evaluation (he reports potential loop recorder).  He wishes to continue to work on HEP independently and return to PT only if he feels it is necessary (PT asked him to call to schedule within the next month if any issues with HEP or concerns).    PT Treatment/Interventions  ADLs/Self Care Home Management;Biofeedback;Canalith Repostioning;Therapeutic exercise;Manual techniques;Vestibular;Therapeutic activities;Functional mobility training;Stair training;Gait training;Patient/family education;DME Instruction;Neuromuscular re-education;Balance training    PT Next Visit Plan  Potential d/c-- patient working on HEP and will call back to schedule if needed.    Consulted and Agree with Plan of Care  Patient       Patient will benefit from skilled therapeutic intervention in order to improve the following deficits and impairments:  Abnormal gait, Postural dysfunction, Dizziness, Decreased cognition, Decreased balance, Decreased mobility, Decreased knowledge of use of DME, Decreased strength  Visit Diagnosis: Dizziness and giddiness  Other abnormalities of gait and mobility  BPPV (benign paroxysmal positional vertigo), bilateral     Problem List Patient Active Problem List   Diagnosis Date Noted  . TIA (transient ischemic attack) 10/09/2016  . Expressive aphasia 10/09/2016  . Slurred speech 10/09/2016  .  Coronary artery disease   . Acute idiopathic pericarditis   . Hypercholesterolemia   . Hypertension   . Vertigo, intermittent     Makenna Macaluso, PT 02/16/2018, 7:14 AM  [facility] [address] [place], [place], [address] Phone: [phone]    Fax:  [phone]  Name: Edward [name] MRN: [id number] Date of Birth: 1941/05/18

## 2018-02-16 NOTE — CV Procedure (Signed)
TRANSESOPHAGEAL ECHOCARDIOGRAM (TEE) NOTE  INDICATIONS: TIA  PROCEDURE:   Informed consent was obtained prior to the procedure. The risks, benefits and alternatives for the procedure were discussed and the patient comprehended these risks.  Risks include, but are not limited to, cough, sore throat, vomiting, nausea, somnolence, esophageal and stomach trauma or perforation, bleeding, low blood pressure, aspiration, pneumonia, infection, trauma to the teeth and death.    After a procedural time-out, the patient was given 7 mg versed and 50 mcg fentanyl for moderate sedation.  The patient's heart rate, blood pressure, and oxygen saturation are monitored continuously during the procedure.The oropharynx was anesthetized 10 cc of topical 1% viscous lidocaine and 2 cetacaine sprays.  The transesophageal probe was inserted in the esophagus and stomach without difficulty and multiple views were obtained.  The patient was kept under observation until the patient left the procedure room.  The period of conscious sedation is 18 minutes, of which I was present face-to-face 100% of this time. The patient left the procedure room in stable condition.   Agitated microbubble saline contrast was administered.  COMPLICATIONS:    There were no immediate complications.  Findings:  1. LEFT VENTRICLE: The left ventricular wall thickness is mildly increased.  The left ventricular cavity is normal in size. Wall motion is normal.  LVEF is 55-60%.  2. RIGHT VENTRICLE:  The right ventricle is normal in structure and function without any thrombus or masses.    3. LEFT ATRIUM:  The left atrium is mildly dilated in size without any thrombus or masses.  There is not spontaneous echo contrast ("smoke") in the left atrium consistent with a low flow state.  4. LEFT ATRIAL APPENDAGE:  The left atrial appendage is free of any thrombus or masses. The appendage has single lobes. Pulse doppler indicates high flow in the  appendage.  5. ATRIAL SEPTUM:  The atrial septum is aneursymal. There is a small PFO with intermittent right to left flow by saline microbubble contrast. This was visualized in 2D and 3D modes with visualization of the PFO opening in real time 3D.   6. RIGHT ATRIUM:  The right atrium is normal in size and function without any thrombus or masses.  7. MITRAL VALVE:  The mitral valve is normal in structure and function with trace to mild regurgitation.  There were no vegetations or stenosis.  8. AORTIC VALVE:  The aortic valve is trileaflet, normal in structure and function with Mild regurgitation. The sinus of valsalva is dilated at 4.1 cm.  There were no vegetations or stenosis  9. TRICUSPID VALVE:  The tricuspid valve is normal in structure and function with trivial regurgitation.  There were no vegetations or stenosis  10.  PULMONIC VALVE:  The pulmonic valve is normal in structure and function with trivial regurgitation.  There were no vegetations or stenosis.   11. AORTIC ARCH, ASCENDING AND DESCENDING AORTA:  There was grade 1 Myrtis Ser et. Al, 1992) atherosclerosis of the ascending aorta, aortic arch, or proximal descending aorta.  12. PULMONARY VEINS: Anomalous pulmonary venous return was not noted.  13. PERICARDIUM: The pericardium appeared normal and non-thickened.  There is no pericardial effusion.  IMPRESSION:   1. 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 No LAA thrombus 3. Mild AI 4. 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 Trace to mild MR 6. Mild LVH 7. Mild LAE 8. LVEF 55-60%  RECOMMENDATIONS:    1.  Referral to structural heart team (Dr.  Cooper) for evaluation of PFO closure given history of TIA.  Time Spent Directly with the Patient:  60 minutes   Chrystie NoseKenneth C. Hilty, MD, [facility] HospitalFACC, FACP  [place]  [facility]  Medical Director of the Advanced Lipid Disorders &  Cardiovascular Risk Reduction Clinic Diplomate of the American Board of Clinical  Lipidology Attending Cardiologist  Direct Dial: [phone]  Fax: [phone]  Website:  [email or web address]  Kenneth C Hilty 02/16/2018, 8:56 AM

## 2018-02-16 NOTE — Discharge Instructions (Signed)

## 2018-02-16 NOTE — Progress Notes (Signed)
  Echocardiogram Echocardiogram Transesophageal has been performed.  Janalyn HarderWest, Anisah Kuck R 02/16/2018, 9:03 AM

## 2018-02-25 ENCOUNTER — Ambulatory Visit: Payer: Medicare Other

## 2018-03-09 ENCOUNTER — Encounter: Payer: Self-pay | Admitting: Adult Health

## 2018-03-09 ENCOUNTER — Ambulatory Visit: Payer: Medicare Other | Admitting: Adult Health

## 2018-03-09 VITALS — BP 128/59 | HR 68 | Ht 66.0 in | Wt 160.4 lb

## 2018-03-09 DIAGNOSIS — Q211 Atrial septal defect: Secondary | ICD-10-CM | POA: Diagnosis not present

## 2018-03-09 DIAGNOSIS — I10 Essential (primary) hypertension: Secondary | ICD-10-CM | POA: Diagnosis not present

## 2018-03-09 DIAGNOSIS — E7800 Pure hypercholesterolemia, unspecified: Secondary | ICD-10-CM

## 2018-03-09 DIAGNOSIS — Q2112 Patent foramen ovale: Secondary | ICD-10-CM

## 2018-03-09 DIAGNOSIS — G459 Transient cerebral ischemic attack, unspecified: Secondary | ICD-10-CM

## 2018-03-09 NOTE — Progress Notes (Signed)
I agree with the above plan 

## 2018-03-09 NOTE — Progress Notes (Signed)
[facility] [address] [place]. [place] [address] [phone]       OFFICE FOLLOW UP NOTE  [name] Date of Birth:  [birth year] Medical Record Number:  [id number]   Reason for Referral:  hospital TIA follow up  CHIEF COMPLAINT:  Chief Complaint  Patient presents with  . Follow-up    TIA per Dr .Pearlean Brownie hospital note patient is alone    HPI: Edward [name] is being seen today for initial visit in the office for TIA on 01/14/18. History obtained from patient and chart review. Reviewed all radiology images and labs personally.  [name] is a 77 y.o. male with history of coronary artery disease with previous stent, hypertension, hyperlipidemia, previous TIA, and occasional vertigo-like symptoms who presented with speech difficulties and confusion. He did not receive IV t-PA due to resolution of deficits.  CT head reviewed and was negative for acute abnormality.  MRI head reviewed and was negative for acute abnormality.  MRA head and neck were unremarkable.  2D echo showed an EF of 50 to 55% without cardiac source of embolus.  Strong suspicion for this event being embolic, it was recommended outpatient TEE with loop recorder placement to rule out atrial fibrillation.  LDL and A1c satisfactory at 63 and 5.5.  Recommended continuation of home Crestor 10 mg daily.  Patient was on Plavix prior to admission and this was recommended for DAPT for 3 weeks and then Plavix alone.  Therapy recommended outpatient PT. Patient underwent TEE on 02/16/2018 and was positive for small PFO and it was felt as though this could be a possible mechanism for his TIA.  Loop recorder canceled and scheduled PFO closure on 03/19/2018.  Patient is being seen today for hospital follow-up and overall is doing well.  He continues to take Plavix with minor bruising but no bleeding.  Continues to take Crestor without side effects myalgias.  Blood pressure today satisfactory 128/59.  Patient  continues to stay active and eat a healthy diet.  Denies new or worsening stroke/TIA symptoms.   ROS:   14 system review of systems performed and negative with exception of blurred vision, easy bruising, dizziness and disinterested activities  PMH:  Past Medical History:  Diagnosis Date  . Arthritis   . At risk for sleep apnea    STOP-BANG = 4    SENT TO PCP 02-10-215  . BPH (benign prostatic hypertrophy)   . Coronary artery disease CARIOLOGIST-- DR Donnie Aho   ANGIOPLASTY TO LAD  . Elevated PSA   . Frequency of urination   . History of kidney stones   . Hyperlipidemia   . Hypertension   . Nocturia   . Spermatocele    left  . Stroke (HCC)   . Urgency of urination   . Vertigo     PSH:  Past Surgical History:  Procedure Laterality Date  . CORONARY ANGIOPLASTY  1995  DR [place]   LAD  . CYSTOSCOPY N/A 09/18/2015   Procedure: CYSTOSCOPY;  Surgeon: Jerilee Field, MD;  Location: [facility];  Service: Urology;  Laterality: N/A;  . CYSTOSCOPY WITH RETROGRADE PYELOGRAM, URETEROSCOPY AND STENT PLACEMENT Left 03/01/2013   Procedure: CYSTOSCOPY WITH LEFT RETROGRADE PYELOGRAM, LEFT URETEROSCOPY and stent PLACEMENT;  Surgeon: Antony Haste, MD;  Location: [facility];  Service: Urology;  Laterality: Left;  . FOOT FUSION Left    SECONDARY TO FX'S  . GREEN LIGHT LASER TURP (TRANSURETHRAL RESECTION OF PROSTATE N/A 10/04/2013  Procedure: GREEN LIGHT LASER TURP (TRANSURETHRAL RESECTION OF PROSTATE;  Surgeon: Antony Haste, MD;  Location: [facility];  Service: Urology;  Laterality: N/A;  . INGUINAL HERNIA REPAIR Bilateral   . SHOULDER ARTHROSCOPY Right 2004  . SHOULDER ARTHROSCOPY WITH DEBRIDEMENT AND BICEP TENDON REPAIR Left 04-28-2011  . SPERMATOCELECTOMY Left 10/04/2013   Procedure: LEFT SPERMATOCELECTOMY;  Surgeon: Antony Haste, MD;  Location: [facility];  Service: Urology;  Laterality: Left;  . TEE WITHOUT CARDIOVERSION N/A 02/16/2018   Procedure:  TRANSESOPHAGEAL ECHOCARDIOGRAM (TEE);  Surgeon: Chrystie Nose, MD;  Location: [facility];  Service: Cardiovascular;  Laterality: N/A;  . TRANSURETHRAL RESECTION OF PROSTATE N/A 12/11/2014   Procedure: CYSTOSCOPY, CLOT EVACUATION, WITH FULGURATION ;  Surgeon: Bjorn Pippin, MD;  Location: [facility];  Service: Urology;  Laterality: N/A;  . TRANSURETHRAL RESECTION OF PROSTATE N/A 09/18/2015   Procedure: TRANSURETHRAL RESECTION OF THE PROSTATE (TURP);  Surgeon: Jerilee Field, MD;  Location: [facility];  Service: Urology;  Laterality: N/A;  . UMBILICAL HERNIA REPAIR  06-02-2008    Social History:  Social History   Socioeconomic History  . Marital status: Married    Spouse name: Not on file  . Number of children: 0  . Years of education: Not on file  . Highest education level: Not on file  Occupational History  . Occupation: Technical sales engineer    Comment: retired  Engineer, production  . Financial resource strain: Not on file  . Food insecurity:    Worry: Not on file    Inability: Not on file  . Transportation needs:    Medical: Not on file    Non-medical: Not on file  Tobacco Use  . Smoking status: Former Smoker    Packs/day: 0.50    Years: 3.00    Pack years: 1.50    Types: Cigarettes    Last attempt to quit: 12/18/1970    Years since quitting: 47.2  . Smokeless tobacco: Never Used  Substance and Sexual Activity  . Alcohol use: Yes    Comment: OCCASIONAL  . Drug use: No  . Sexual activity: Not on file  Lifestyle  . Physical activity:    Days per week: Not on file    Minutes per session: Not on file  . Stress: Not on file  Relationships  . Social connections:    Talks on phone: Not on file    Gets together: Not on file    Attends religious service: Not on file    Active member of club or organization: Not on file    Attends meetings of clubs or organizations: Not on file    Relationship status: Not on file  . Intimate partner violence:    Fear of current or ex partner: Not  on file    Emotionally abused: Not on file    Physically abused: Not on file    Forced sexual activity: Not on file  Other Topics Concern  . Not on file  Social History Narrative  . Not on file    Family History: History reviewed. No pertinent family history.  Medications:   Current Outpatient Medications on File Prior to Visit  Medication Sig Dispense Refill  . amLODipine (NORVASC) 5 MG tablet Take 5 mg by mouth daily.     . clopidogrel (PLAVIX) 75 MG tablet Take 1 tablet (75 mg total) by mouth daily. 30 tablet 0  . Coenzyme Q10 (CO Q 10) 100 MG CAPS Take 100 mg by mouth daily.     Marland Kitchen  Glucosamine HCl (SM GLUCOSAMINE HCL) 1500 MG TABS Take 1,500 mg by mouth daily.    Marland Kitchen losartan (COZAAR) 100 MG tablet Take 100 mg by mouth daily.     Marland Kitchen MELATONIN PO Take 0.5 tablets by mouth daily as needed (sleep).    . Omega-3 Fatty Acids (FISH OIL) 1000 MG CAPS Take 100 mg by mouth daily.     . rosuvastatin (CRESTOR) 20 MG tablet Take 0.5 tablets (10 mg total) by mouth every evening. (Patient taking differently: Take 20 mg by mouth every evening. ) 30 tablet 0   No current facility-administered medications on file prior to visit.     Allergies:  No Known Allergies   Physical Exam  Vitals:   03/09/18 1057  BP: (!) 128/59  Pulse: 68  Weight: 160 lb 6.4 oz (72.8 kg)  Height: 5\' 6"  (1.676 m)   Body mass index is 25.89 kg/m. No exam data present  General: well developed, well nourished, pleasant elderly Caucasian male, seated, in no evident distress Head: head normocephalic and atraumatic.   Neck: supple with no carotid or supraclavicular bruits Cardiovascular: regular rate and rhythm, no murmurs Musculoskeletal: no deformity Skin:  no rash/petichiae Vascular:  Normal pulses all extremities  Neurologic Exam Mental Status: Awake and fully alert. Oriented to place and time. Recent and remote memory intact. Attention span, concentration and fund of knowledge appropriate. Mood and affect  appropriate.  Cranial Nerves: Fundoscopic exam reveals sharp disc margins. Pupils equal, briskly reactive to light. Extraocular movements full without nystagmus. Visual fields full to confrontation. Hearing intact. Facial sensation intact. Face, tongue, palate moves normally and symmetrically.  Motor: Normal bulk and tone. Normal strength in all tested extremity muscles. Sensory.: intact to touch , pinprick , position and vibratory sensation.  Coordination: Rapid alternating movements normal in all extremities. Finger-to-nose and heel-to-shin performed accurately bilaterally. Gait and Station: Arises from chair without difficulty. Stance is normal. Gait demonstrates normal stride length and balance . Able to heel, toe and tandem walk without difficulty.  Reflexes: 1+ and symmetric. Toes downgoing.    NIHSS  0 Modified Rankin  1    Diagnostic Data (Labs, Imaging, Testing)  CT HEAD WO CONTRAST 01/15/18 IMPRESSION: 1. No acute intracranial pathology seen on CT. 2. Mild cortical volume loss and scattered small vessel ischemic Microangiopathy.  MR BRAIN WO CONTRAST MR MRA HEAD WO CONTRAST MR MRA NECK W WO CONTRAST 01/15/18 IMPRESSION: 1. No acute intracranial abnormality. 2. Patent carotid and vertebral arteries. No dissection, aneurysm, or hemodynamically significant stenosis utilizing NASCET criteria. 3. Patent anterior and posterior intracranial circulation. No large vessel occlusion, aneurysm, or significant stenosis. 4. Mild for age chronic microvascular ischemic changes and parenchymal volume loss of the brain.  ECHOCARDIOGRAM 01/15/18 Study Conclusions - Left ventricle: The cavity size was normal. Systolic function was   normal. The estimated ejection fraction was in the range of 50%   to 55%. Wall motion was normal; there were no regional wall   motion abnormalities. - Aortic valve: AV is trileaflet with some calcification and   thickening Two jets of AR one central and  one in periphery   between left and right cusps which is a bit   unusual. There was mild to moderate regurgitation. Valve area   (VTI): 2.32 cm^2. Valve area (Vmax): 2.95 cm^2. Valve area   (Vmean): 2.41 cm^2. - Mitral valve: Moderately calcified annulus. Moderately thickened   leaflets . There was mild regurgitation. Valve area by pressure   half-time: 1.26 cm^2. -  Left atrium: The atrium was mildly dilated. - Atrial septum: No defect or patent foramen ovale was identified.  ECHO TEE 02/16/18 Impressions: - Small PFO with intermittent right to left flow noted by saline   microbubble contrast. This can be a possible mechanism for his   TIA. Consider referral to Dr. Excell Seltzerooper with the structural heart   clinic for closure evaluation.     ASSESSMENT: Edward [name] is a 77 y.o. year old male here with TIA on 01/15/18 secondary to PFO. Vascular risk factors include HTN and HLD.     PLAN: -Continue clopidogrel 75 mg daily  and Crestor 10 mg for secondary stroke prevention -F/u with PCP regarding your HLD and HTN management -Order placed for bilateral lower extremity ultrasound to rule out DVT after new finding of PFO -Advised to continue to stay active and eat a healthy diet -continue to monitor BP at home -PFO closure 03/19/2018 -Maintain strict control of hypertension with blood pressure goal below 130/90, diabetes with hemoglobin A1c goal below 6.5% and cholesterol with LDL cholesterol (bad cholesterol) goal below 70 mg/dL. I also advised the patient to eat a healthy diet with plenty of whole grains, cereals, fruits and vegetables, exercise regularly and maintain ideal body weight.  Follow up in 6 months or call earlier if needed   Greater than 50% of time during this 25 minute visit was spent on counseling,explanation of diagnosis of TIA, reviewing risk factor management of HLD, HTN and PFO, planning of further management, discussion with patient and family and coordination of  care    George HughJessica VanSchaick, [facility] MiamiGNP-BC  [facility] [address] [place], [place] [id number]  Phone [phone] Fax [phone]

## 2018-03-09 NOTE — Patient Instructions (Addendum)
Continue clopidogrel 75 mg daily  and Crestor  for secondary stroke prevention  Continue to follow up with PCP regarding cholesterol and blood pressure management   Continue to stay active and eat healthy  You will be called to schedule lower extremity ultrasound to rule out DVT  Continue to monitor blood pressure at home  Maintain strict control of hypertension with blood pressure goal below 130/90, diabetes with hemoglobin A1c goal below 6.5% and cholesterol with LDL cholesterol (bad cholesterol) goal below 70 mg/dL. I also advised the patient to eat a healthy diet with plenty of whole grains, cereals, fruits and vegetables, exercise regularly and maintain ideal body weight.  Followup in the future with me in 6 months or call earlier if needed        Thank you for coming to see us at [facility]. I hope we have been able to provide you high quality care today.  You may receive a patient satisfaction survey over the next few weeks. We would appreciate your feedback and comments so that we may continue to improve ourselves and the health of our patients.

## 2018-03-15 ENCOUNTER — Ambulatory Visit: Payer: Medicare Other | Admitting: Cardiovascular Disease

## 2018-03-15 ENCOUNTER — Encounter (INDEPENDENT_AMBULATORY_CARE_PROVIDER_SITE_OTHER): Payer: Self-pay

## 2018-03-15 ENCOUNTER — Encounter: Payer: Self-pay | Admitting: Cardiovascular Disease

## 2018-03-15 VITALS — BP 106/60 | HR 84 | Ht 66.0 in | Wt 160.8 lb

## 2018-03-15 DIAGNOSIS — Q2112 Patent foramen ovale: Secondary | ICD-10-CM

## 2018-03-15 DIAGNOSIS — Q211 Atrial septal defect: Secondary | ICD-10-CM

## 2018-03-15 NOTE — Patient Instructions (Signed)
Medication Instructions:  Your provider recommends that you continue on your current medications as directed. Please refer to the Current Medication list given to you today.    Labwork: None   Testing/Procedures: None  Follow-Up: We will contact you to discuss plans in further detail after Dr. Excell Seltzerooper speaks with Neurology.   Any Other Special Instructions Will Be Listed Below (If Applicable).     If you need a refill on your cardiac medications before your next appointment, please call your pharmacy.

## 2018-03-15 NOTE — Progress Notes (Signed)
Cardiology Office Note Date:  03/15/2018   ID:  Edward BeetsVictor X Harney, DOB Feb 13, 1941, MRN [id number]  PCP:  Chilton GreathouseAvva, Ravisankar, MD  Cardiologist:  Georga HackingW Spencer Tilley, MD  Chief Complaint  Patient presents with  . Dizziness   History of Present Illness: Edward [name] is a 77 y.o. male who presents for evaluation of PFO closure, referred by Dr Donnie Ahoilley.   The patient has a history of HTN, coronary artery disease, and hyperlipidemia, presenting in May 2019 with an episode of word finding difficulty and garbled speech associated with confusion. This was his second episode, the previous occurring approximately one year earlier. He had been compliant with aspirin prior to the first event, and has subsequently been compliant with plavix which was started one year ago at the time of his first event.  His evaluation included an echocardiogram, carotid ultrasound, CT and MRI studies of the brain.  All of these were unrevealing.  There is no evidence of cortical infarction on his neuroimaging studies.  He had no arrhythmia documented.  His hospital stay.  He was initially set up for outpatient TEE and implantable loop recorder.  However, after his TEE demonstrated a small PFO, the loop recorder implant was canceled.  He is referred for consideration of PFO closure.  The patient continues to have problems with vertigo and dizziness which is a long-standing issue for him.  He otherwise has no specific limitation.  He denies chest pain or shortness of breath.  He denies lightheadedness or presyncope.  No orthopnea, edema, or PND.  He has a remote history of heart palpitations but no recent palpitations.   Past Medical History:  Diagnosis Date  . Arthritis   . At risk for sleep apnea    STOP-BANG = 4    SENT TO PCP 02-10-215  . BPH (benign prostatic hypertrophy)   . Coronary artery disease CARIOLOGIST-- DR Donnie AhoILLEY   ANGIOPLASTY TO LAD  . Elevated PSA   . Frequency of urination   . History of kidney stones     . Hyperlipidemia   . Hypertension   . Nocturia   . Spermatocele    left  . Stroke (HCC)   . Urgency of urination   . Vertigo     Past Surgical History:  Procedure Laterality Date  . CORONARY ANGIOPLASTY  1995  DR [place]   LAD  . CYSTOSCOPY N/A 09/18/2015   Procedure: CYSTOSCOPY;  Surgeon: Jerilee FieldMatthew Eskridge, MD;  Location: [facility];  Service: Urology;  Laterality: N/A;  . CYSTOSCOPY WITH RETROGRADE PYELOGRAM, URETEROSCOPY AND STENT PLACEMENT Left 03/01/2013   Procedure: CYSTOSCOPY WITH LEFT RETROGRADE PYELOGRAM, LEFT URETEROSCOPY and stent PLACEMENT;  Surgeon: Antony HasteMatthew Ramsey Eskridge, MD;  [facility];  Service: Urology;  Laterality: Left;  . FOOT FUSION Left    SECONDARY TO FX'S  . GREEN LIGHT LASER TURP (TRANSURETHRAL RESECTION OF PROSTATE N/A 10/04/2013   Procedure: GREEN LIGHT LASER TURP (TRANSURETHRAL RESECTION OF PROSTATE;  Surgeon: Antony HasteMatthew Ramsey Eskridge, MD;  Location: [facility];  Service: Urology;  Laterality: N/A;  . INGUINAL HERNIA REPAIR Bilateral   . SHOULDER ARTHROSCOPY Right 2004  . SHOULDER ARTHROSCOPY WITH DEBRIDEMENT AND BICEP TENDON REPAIR Left 04-28-2011  . SPERMATOCELECTOMY Left 10/04/2013   Procedure: LEFT SPERMATOCELECTOMY;  Surgeon: Antony HasteMatthew Ramsey Eskridge, MD;  Location: [facility];  Service: Urology;  Laterality: Left;  . TEE WITHOUT CARDIOVERSION N/A 02/16/2018   Procedure: TRANSESOPHAGEAL ECHOCARDIOGRAM (TEE);  Surgeon: Chrystie NoseHilty, Kenneth C, MD;  [facility];  Service:  Cardiovascular;  Laterality: N/A;  . TRANSURETHRAL RESECTION OF PROSTATE N/A 12/11/2014   Procedure: CYSTOSCOPY, CLOT EVACUATION, WITH FULGURATION ;  Surgeon: Bjorn Pippin, MD;  Location: [facility];  Service: Urology;  Laterality: N/A;  . TRANSURETHRAL RESECTION OF PROSTATE N/A 09/18/2015   Procedure: TRANSURETHRAL RESECTION OF THE PROSTATE (TURP);  Surgeon: Jerilee Field, MD;  Location: [facility];  Service: Urology;  Laterality: N/A;  . UMBILICAL HERNIA REPAIR  06-02-2008     Current Outpatient Medications  Medication Sig Dispense Refill  . amLODipine (NORVASC) 5 MG tablet Take 5 mg by mouth daily.     . budesonide-formoterol (SYMBICORT) 160-4.5 MCG/ACT inhaler Inhale 1 puff into the lungs 2 (two) times daily as needed (for shortness of breath).    . clopidogrel (PLAVIX) 75 MG tablet Take 1 tablet (75 mg total) by mouth daily. 30 tablet 0  . Coenzyme Q10 (CO Q 10) 100 MG CAPS Take 100 mg by mouth daily.     Marland Kitchen doxylamine, Sleep, (UNISOM) 25 MG tablet Take 25 mg by mouth at bedtime as needed for sleep.    . Glucosamine HCl (SM GLUCOSAMINE HCL) 1500 MG TABS Take 1,500 mg by mouth daily.    . Omega-3 Fatty Acids (FISH OIL) 1000 MG CAPS Take 1,000 mg by mouth daily.     . rosuvastatin (CRESTOR) 20 MG tablet Take 0.5 tablets (10 mg total) by mouth every evening. (Patient taking differently: Take 20 mg by mouth every evening. ) 30 tablet 0  . losartan (COZAAR) 100 MG tablet Take 100 mg by mouth daily.      No current facility-administered medications for this visit.     Allergies:   Patient has no known allergies.   Social History:  The patient  reports that he quit smoking about 47 years ago. His smoking use included cigarettes. He has a 1.50 pack-year smoking history. He has never used smokeless tobacco. He reports that he drinks alcohol. He reports that he does not use drugs.   Family History:  The patient's family history is not on file.    ROS:  Please see the history of present illness.  All other systems are reviewed and negative.    PHYSICAL EXAM: VS:  BP 106/60   Pulse 84   Ht 5\' 6"  (1.676 m)   Wt 160 lb 12.8 oz (72.9 kg)   SpO2 97%   BMI 25.95 kg/m  , BMI Body mass index is 25.95 kg/m. GEN: Well nourished, well developed, in no acute distress  HEENT: normal  Neck: no JVD, no masses. No carotid bruits Cardiac: RRR with a 1/6 diastolic decrescendo murmur best heard at the LLSB                Respiratory:  clear to auscultation bilaterally,  normal work of breathing GI: soft, nontender, nondistended, + BS MS: no deformity or atrophy  Ext: no pretibial edema, pedal pulses 2+= bilaterally Skin: warm and dry, no rash Neuro:  Strength and sensation are intact Psych: euthymic mood, full affect  EKG:  EKG is not ordered today.  Recent Labs: 01/14/2018: ALT 21; BUN 15; Creatinine, Ser 0.90; Hemoglobin 12.6; Platelets 183; Potassium 4.2; Sodium 141   Lipid Panel     Component Value Date/Time   CHOL 117 01/15/2018 0453   TRIG 64 01/15/2018 0453   HDL 41 01/15/2018 0453   CHOLHDL 2.9 01/15/2018 0453   VLDL 13 01/15/2018 0453   LDLCALC 63 01/15/2018 0453      Wt Readings  from Last 3 Encounters:  03/15/18 160 lb 12.8 oz (72.9 kg)  03/09/18 160 lb 6.4 oz (72.8 kg)  02/16/18 160 lb 9.6 oz (72.8 kg)     Cardiac Studies Reviewed: TEE 02-16-2018: Study Conclusions  - Left ventricle: The cavity size was normal. There was mild   concentric hypertrophy. Systolic function was normal. The   estimated ejection fraction was in the range of 55% to 60%. Wall   motion was normal; there were no regional wall motion   abnormalities. - Aortic valve: Trileaflet. Mild to moderate eccentric AI, 2 jets,   the larger jet is toward the anterior mitral valve leaflet. - Aorta: Dilated sinus of valsalva to 4.1 cm. - Left atrium: Mildly dilated. No evidence of thrombus in the   atrial cavity or appendage. - Right atrium: No evidence of thrombus in the atrial cavity or   appendage. - Atrial septum: There is a small PFO by saline microbubble   contrast which demonstrates intermittent right to left flow.  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 This can be a possible mechanism for his   TIA. Consider referral to Dr. Excell Seltzer with the structural heart   clinic for closure evaluation.  ASSESSMENT AND PLAN: Patent foramen ovale: Radiographic results, hospital notes, lab results,  and cardiac imaging data are reviewed. TEE images are reviewed and demonstrate normal LV systolic function, moderate eccentric aortic insufficiency, and a small PFO with positive saline bubble study.  The patient's Rope Score is 2, indicating a low probability that the patient's stroke is 'PFO-related.'  We discussed the association of PFO and cryptogenic stroke today.  The patient understands that PFO is a very common finding in the general population, but also that the incidence of PFO is increased in a cryptogenic stroke population and there is a potential mechanism of paradoxical embolism.   The patient's age, lack of cortical infarction on brain MRI, presence of hypertension and atherosclerotic CAD, all suggest that this patient would likely not benefit from transcatheter closure. I will discuss his case with Dr Pearlean Brownie and it may be reasonable to consider an implantable loop recorder to evaluate the patient for paroxysmal atrial fibrillation. He otherwise is on an appropriate risk reduction program with clopidogrel and rosuvastatin.  Advised him that I would be in touch after reviewing his case further with neurology.  He is willing to consider an implantable loop recorder if indicated.  Labs/ tests ordered today include:  No orders of the defined types were placed in this encounter.   Disposition:   FU prn  Signed, Tonny Bollman, MD  03/15/2018 2:00 PM    [facility] [address] [place], [place], [place]  [address] Phone: [phone]; Fax: [phone]

## 2018-03-19 ENCOUNTER — Ambulatory Visit (HOSPITAL_COMMUNITY): Admission: RE | Admit: 2018-03-19 | Payer: Medicare Other | Source: Ambulatory Visit | Admitting: Cardiovascular Disease

## 2018-03-19 ENCOUNTER — Encounter (HOSPITAL_COMMUNITY): Admission: RE | Payer: Self-pay | Source: Ambulatory Visit

## 2018-03-19 SURGERY — PATENT FORAMEN OVALE (PFO) CLOSURE
Anesthesia: LOCAL

## 2018-03-22 ENCOUNTER — Encounter: Payer: Self-pay | Admitting: Rehabilitative and Restorative Service Providers"

## 2018-03-22 NOTE — Therapy (Signed)
[facility] [address] [place], [place], [address] Phone: [phone]   Fax:  [phone]  Patient Details  Name: Edward [name] MRN: [id number] Date of Birth: [birth year] Referring Provider:  No ref. provider found  Encounter Date: Last encounter 02/15/2018  PHYSICAL THERAPY DISCHARGE SUMMARY  Visits from Start of Care: 6  Current functional level related to goals / functional outcomes: PT Long Term Goals - 02/15/18 1411      PT LONG TERM GOAL #1   Title  Pt will be IND in HEP to reduce dizziness, improve balance, and strength. TARGET DATE FOR ALL LTGS: 02/16/18    Status  Achieved      PT LONG TERM GOAL #2   Title  Perform FGA and write goal as indicated.     Baseline  21/30 FGA    Status  Achieved      PT LONG TERM GOAL #3   Title  Pt will amb. outdoors, 500', over uneven terrain IND while performing head turns to improve safety during functional mobility.     Baseline  Met per patient report with patient noting he mows his yard, scans environment, etc.  Did not perform during session due to >90 degree heat outdoors.    Status  Achieved      PT LONG TERM GOAL #4   Title  Pt will report baseline dizziness </=1/10 to improve QOL and safety during ADLs.     Baseline  Baseline dizziness today is 1/10    Status  Achieved      PT LONG TERM GOAL #5   Title  Reassess positional testing and write goals as indicated.     Baseline  s/s appear central in nature, will continue to monitor. Habitation HEP provided.     Status  Deferred      PT LONG TERM GOAL #6   Title  Pt will improve FGA score to >/=29/30 to decr. falls risk.     Baseline  27/30 on 02/15/2018    Status  Partially Met         Remaining deficits: Patient had requested to work on HEP while undergoing further medical workup for lightheadedness. PT placed on hold for patient to f/u if need arose. He did not f/u for further intervention.   Education /  Equipment: Home program.  Plan: Patient agrees to discharge.  Patient goals were met. Patient is being discharged due to meeting the stated rehab goals.  ?????      Thank you for the referral of this patient. Rudell Cobb, MPT   Edward [name] 03/22/2018, 8:18 AM  [facility] [address] [place], [place], [address] Phone: [phone]   Fax:  [phone]

## 2018-04-08 ENCOUNTER — Telehealth: Payer: Self-pay

## 2018-04-08 NOTE — Telephone Encounter (Signed)
Call placed to Pt.  Scheduled for loop implant on 04/20/2018 at 7:00 am with Dr. Ladona Ridgelaylor.  Will send to precert and scheduling.

## 2018-04-08 NOTE — Telephone Encounter (Signed)
-----   Message from Marily LenteAmber K Seiler, NP sent at 04/02/2018  7:59 AM EDT ----- Lanier EnsignHi Jenny,  Dr Ladona Ridgelaylor saw this patient before - see below.  Dr Pearlean BrownieSethi would like for him to have ILR placed. Can you schedule with Dr Ladona Ridgelaylor?  Thanks! Amber ----- Message ----- From: Tonny Bollmanooper, Michael, MD Sent: 04/01/2018   4:57 PM EDT To: Micki RileyPramod S Sethi, MD, Othella BoyerWilliam S Tilley, MD, #  thx Pramod. I will ask Amber to arrange ----- Message ----- From: Micki RileySethi, Pramod S, MD Sent: 04/01/2018   4:28 PM EDT To: Othella BoyerWilliam S Tilley, MD, Tonny BollmanMichael Cooper, MD  [employer] I agree he needs loop recorder and not PFO closure. The positive  PFO closure trials enrolled  cryptogenic strokes and not TIAs hence we do not know if PFo closure will help in elderly patient with TIA. High likelihood of PAF  Pramod ----- Message ----- From: Tonny Bollmanooper, Michael, MD Sent: 03/15/2018   2:04 PM EDT To: Micki RileyPramod S Sethi, MD  Pramod - when you're back from vacation can you look over his chart and make sure you agree with plan? If so I will set him up for a Linq.   thx - Kathlene NovemberMike

## 2018-04-20 ENCOUNTER — Encounter (HOSPITAL_COMMUNITY): Payer: Self-pay | Admitting: Internal Medicine

## 2018-04-20 ENCOUNTER — Ambulatory Visit (HOSPITAL_COMMUNITY)
Admission: RE | Admit: 2018-04-20 | Discharge: 2018-04-20 | Disposition: A | Discharge status: Home / Self Care | Payer: Medicare Other | Source: Ambulatory Visit | Attending: Internal Medicine | Admitting: Internal Medicine

## 2018-04-20 ENCOUNTER — Encounter (HOSPITAL_COMMUNITY)
Admission: RE | Disposition: A | Discharge status: Home / Self Care | Payer: Self-pay | Source: Ambulatory Visit | Attending: Internal Medicine

## 2018-04-20 ENCOUNTER — Other Ambulatory Visit: Payer: Self-pay

## 2018-04-20 DIAGNOSIS — I639 Cerebral infarction, unspecified: Secondary | ICD-10-CM | POA: Insufficient documentation

## 2018-04-20 DIAGNOSIS — R351 Nocturia: Secondary | ICD-10-CM | POA: Diagnosis not present

## 2018-04-20 DIAGNOSIS — I2510 Atherosclerotic heart disease of native coronary artery without angina pectoris: Secondary | ICD-10-CM | POA: Insufficient documentation

## 2018-04-20 DIAGNOSIS — Z87891 Personal history of nicotine dependence: Secondary | ICD-10-CM | POA: Insufficient documentation

## 2018-04-20 DIAGNOSIS — Z7902 Long term (current) use of antithrombotics/antiplatelets: Secondary | ICD-10-CM | POA: Insufficient documentation

## 2018-04-20 DIAGNOSIS — G459 Transient cerebral ischemic attack, unspecified: Secondary | ICD-10-CM | POA: Diagnosis present

## 2018-04-20 DIAGNOSIS — R350 Frequency of micturition: Secondary | ICD-10-CM | POA: Insufficient documentation

## 2018-04-20 DIAGNOSIS — I6389 Other cerebral infarction: Secondary | ICD-10-CM | POA: Diagnosis not present

## 2018-04-20 DIAGNOSIS — M1990 Unspecified osteoarthritis, unspecified site: Secondary | ICD-10-CM | POA: Insufficient documentation

## 2018-04-20 DIAGNOSIS — I10 Essential (primary) hypertension: Secondary | ICD-10-CM | POA: Insufficient documentation

## 2018-04-20 DIAGNOSIS — R4701 Aphasia: Secondary | ICD-10-CM | POA: Diagnosis not present

## 2018-04-20 DIAGNOSIS — N401 Enlarged prostate with lower urinary tract symptoms: Secondary | ICD-10-CM | POA: Insufficient documentation

## 2018-04-20 DIAGNOSIS — R3915 Urgency of urination: Secondary | ICD-10-CM | POA: Diagnosis not present

## 2018-04-20 DIAGNOSIS — E785 Hyperlipidemia, unspecified: Secondary | ICD-10-CM | POA: Diagnosis not present

## 2018-04-20 HISTORY — PX: LOOP RECORDER INSERTION: EP1214

## 2018-04-20 SURGERY — LOOP RECORDER INSERTION
Anesthesia: LOCAL

## 2018-04-20 MED ORDER — LIDOCAINE-EPINEPHRINE 1 %-1:100000 IJ SOLN
INTRAMUSCULAR | Status: DC | PRN
  Administered 2018-04-20: 30 mL

## 2018-04-20 SURGICAL SUPPLY — 2 items
LOOP REVEAL LINQSYS (Prosthesis & Implant Heart) ×1 IMPLANT
PACK LOOP INSERTION (CUSTOM PROCEDURE TRAY) ×2 IMPLANT

## 2018-04-20 NOTE — H&P (Signed)
HPI [name] is referred today by Dr. 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 The patient has been mostly healthy. He has HTN. He is retired. He has a h/o chronic vertigo. The patient has had 2 neuro events where he had aphasia. His symptoms resolved completely. He had a MR of the brain which was unrevealing. 2D echo revealed aortic calcification.  No Known Allergies         Current Outpatient Medications  Medication Sig Dispense Refill  . amLODipine (NORVASC) 10 MG tablet Take 5 mg by mouth daily.     . clopidogrel (PLAVIX) 75 MG tablet Take 1 tablet (75 mg total) by mouth daily. 30 tablet 0  . Coenzyme Q10 (CO Q 10 PO) Take 1 capsule by mouth daily.    . Glucosamine 500 MG CAPS Take 1 capsule by mouth daily.    Marland Kitchen losartan (COZAAR) 100 MG tablet Take 50 mg by mouth 2 (two) times daily.    . Omega-3 Fatty Acids (FISH OIL) 1000 MG CAPS Take 1 capsule by mouth daily.    . rosuvastatin (CRESTOR) 20 MG tablet Take 0.5 tablets (10 mg total) by mouth every evening. 30 tablet 0   No current facility-administered medications for this visit.          Past Medical History:  Diagnosis Date  . Arthritis   . At risk for sleep apnea    STOP-BANG = 4    SENT TO PCP 02-10-215  . BPH (benign prostatic hypertrophy)   . Coronary artery disease CARIOLOGIST-- DR Donnie Aho   ANGIOPLASTY TO LAD  . Elevated PSA   . Frequency of urination   . History of kidney stones   . Hyperlipidemia   . Hypertension   . Nocturia   . Spermatocele    left  . Urgency of urination   . Vertigo     ROS:   All systems reviewed and negative except as noted in the HPI.        Past Surgical History:  Procedure Laterality Date  . CORONARY ANGIOPLASTY  1995  DR [place]   LAD  . CYSTOSCOPY N/A 09/18/2015   Procedure: CYSTOSCOPY;  Surgeon: Jerilee Field, MD;  Location: [facility];  Service: Urology;  Laterality: N/A;  . CYSTOSCOPY  WITH RETROGRADE PYELOGRAM, URETEROSCOPY AND STENT PLACEMENT Left 03/01/2013   Procedure: CYSTOSCOPY WITH LEFT RETROGRADE PYELOGRAM, LEFT URETEROSCOPY and stent PLACEMENT;  Surgeon: Antony Haste, MD;  [facility];  Service: Urology;  Laterality: Left;  . FOOT FUSION Left    SECONDARY TO FX'S  . GREEN LIGHT LASER TURP (TRANSURETHRAL RESECTION OF PROSTATE N/A 10/04/2013   Procedure: GREEN LIGHT LASER TURP (TRANSURETHRAL RESECTION OF PROSTATE;  Surgeon: Antony Haste, MD;  Location: [facility];  Service: Urology;  Laterality: N/A;  . INGUINAL HERNIA REPAIR Bilateral   . SHOULDER ARTHROSCOPY Right 2004  . SHOULDER ARTHROSCOPY WITH DEBRIDEMENT AND BICEP TENDON REPAIR Left 04-28-2011  . SPERMATOCELECTOMY Left 10/04/2013   Procedure: LEFT SPERMATOCELECTOMY;  Surgeon: Antony Haste, MD;  Location: [facility];  Service: Urology;  Laterality: Left;  . TRANSURETHRAL RESECTION OF PROSTATE N/A 12/11/2014   Procedure: CYSTOSCOPY, CLOT EVACUATION, WITH FULGURATION ;  Surgeon: Bjorn Pippin, MD;  Location: [facility];  Service: Urology;  Laterality: N/A;  . TRANSURETHRAL RESECTION OF PROSTATE N/A 09/18/2015   Procedure: TRANSURETHRAL RESECTION OF THE PROSTATE (TURP);  Surgeon: Jerilee Field, MD;  Location: [facility];  Service: Urology;  Laterality: N/A;  . UMBILICAL HERNIA REPAIR  06-02-2008     No family history on file.   Social History        Socioeconomic History  . Marital status: Married    Spouse name: Not on file  . Number of children: 0  . Years of education: Not on file  . Highest education level: Not on file  Occupational History  . Occupation: Technical sales engineer    Comment: retired  Engineer, production  . Financial resource strain: Not on file  . Food insecurity:    Worry: Not on file    Inability: Not on file  . Transportation needs:    Medical: Not on file    Non-medical: Not on file  Tobacco Use  . Smoking status:  Former Smoker    Packs/day: 0.50    Years: 3.00    Pack years: 1.50    Types: Cigarettes    Last attempt to quit: 12/18/1970    Years since quitting: 47.1  . Smokeless tobacco: Never Used  Substance and Sexual Activity  . Alcohol use: Yes    Comment: OCCASIONAL  . Drug use: No  . Sexual activity: Not on file  Lifestyle  . Physical activity:    Days per week: Not on file    Minutes per session: Not on file  . Stress: Not on file  Relationships  . Social connections:    Talks on phone: Not on file    Gets together: Not on file    Attends religious service: Not on file    Active member of club or organization: Not on file    Attends meetings of clubs or organizations: Not on file    Relationship status: Not on file  . Intimate partner violence:    Fear of current or ex partner: Not on file    Emotionally abused: Not on file    Physically abused: Not on file    Forced sexual activity: Not on file  Other Topics Concern  . Not on file  Social History Narrative  . Not on file     BP 122/74   Pulse 78   Ht 5\' 6"  (1.676 m)   Wt 160 lb 9.6 oz (72.8 kg)   BMI 25.92 kg/m   Physical Exam:  Well appearing NAD HEENT: Unremarkable Neck:  No JVD, no thyromegally Lymphatics:  No adenopathy Back:  No CVA tenderness Lungs:  Clear with no wheezes HEART:  Regular rate rhythm, no murmurs, no rubs, no clicks Abd:  soft, positive bowel sounds, no organomegally, no rebound, no guarding Ext:  2 plus pulses, no edema, no cyanosis, no clubbing Skin:  No rashes no nodules Neuro:  CN II through XII intact, motor grossly intact  EKG - NSR  Assess/Plan: 1. TIA - I have discussed insertion of an ILR if TEE is unrevealing. The indications/risks/benefits/goals/expectations of the procedure were reviewed and he wishes to proceed. I spent 40 minutes with 50% face to face time with the patient producing this note.  Lewayne Bunting, M.D.  EP  Attending  Patient seen and examined. Agree with above. No change since prior clinic visit. We will proceed with ILR insertion.  Leonia Reeves.D.

## 2018-04-27 ENCOUNTER — Telehealth: Payer: Self-pay | Admitting: Adult Health

## 2018-04-27 NOTE — Telephone Encounter (Signed)
Received lab work from [employer] from 04/09/18  CMP: WNL CBC: WNL Lipid: LDL 72 TSH: 1.67 PSA: 6.559

## 2018-05-03 ENCOUNTER — Ambulatory Visit (INDEPENDENT_AMBULATORY_CARE_PROVIDER_SITE_OTHER): Payer: Medicare Other | Admitting: *Deleted

## 2018-05-03 DIAGNOSIS — G459 Transient cerebral ischemic attack, unspecified: Secondary | ICD-10-CM

## 2018-05-03 NOTE — Progress Notes (Signed)
Wound Loop check in clinic. Incision edges approximated, no redness or edema wound well healed, Battery status: Good. R-waves 0.4950mV. 0 symptom episodes, 0 tachy episodes, 0 pause episodes, 0 brady episodes. 0 AF episodes Monthly summary reports and ROV with GT PRN

## 2018-05-24 ENCOUNTER — Ambulatory Visit (INDEPENDENT_AMBULATORY_CARE_PROVIDER_SITE_OTHER): Payer: Medicare Other | Admitting: *Deleted

## 2018-05-24 DIAGNOSIS — G459 Transient cerebral ischemic attack, unspecified: Secondary | ICD-10-CM

## 2018-05-25 NOTE — Progress Notes (Signed)
Carelink Summary Report / Loop Recorder 

## 2018-05-26 ENCOUNTER — Encounter: Payer: Medicare Other | Admitting: Internal Medicine

## 2018-06-07 LAB — CUP PACEART REMOTE DEVICE CHECK
Date Time Interrogation Session: 20191006113645
MDC IDC PG IMPLANT DT: 20190903

## 2018-06-25 ENCOUNTER — Ambulatory Visit (INDEPENDENT_AMBULATORY_CARE_PROVIDER_SITE_OTHER): Payer: Medicare Other | Admitting: *Deleted

## 2018-06-25 DIAGNOSIS — G459 Transient cerebral ischemic attack, unspecified: Secondary | ICD-10-CM

## 2018-06-27 NOTE — Progress Notes (Signed)
Carelink Summary Report / Loop Recorder 

## 2018-06-29 ENCOUNTER — Telehealth: Payer: Self-pay | Admitting: Cardiology

## 2018-06-30 NOTE — Telephone Encounter (Signed)
done

## 2018-07-28 ENCOUNTER — Ambulatory Visit (INDEPENDENT_AMBULATORY_CARE_PROVIDER_SITE_OTHER): Payer: Medicare Other

## 2018-07-28 DIAGNOSIS — I639 Cerebral infarction, unspecified: Secondary | ICD-10-CM

## 2018-07-29 NOTE — Progress Notes (Signed)
Carelink Summary Report / Loop Recorder 

## 2018-08-14 LAB — CUP PACEART REMOTE DEVICE CHECK
Date Time Interrogation Session: 20191108113642
MDC IDC PG IMPLANT DT: 20190903

## 2018-08-19 ENCOUNTER — Telehealth: Payer: Self-pay

## 2018-08-19 NOTE — Telephone Encounter (Signed)
Pt sent in remote transmission. Pt understands we will contact him back if there was anything of note.

## 2018-08-20 MED ORDER — APIXABAN 5 MG PO TABS: 5.0000 mg | ORAL_TABLET | Freq: Two times a day (BID) | ORAL | 3 refills | Status: DC

## 2018-08-20 NOTE — Telephone Encounter (Signed)
Called pt regarding his recent remote check showing prolonged episode of Afib. Pt has hx of TIA. Per Dr Ladona Ridgel, pt is to DC plavix and begin Eliquis, 5mg  bid. He is to follow up with Afib clinic in 4 weeks. I attempted to arrange a follow up appointment with pt over the phone, but he told me to "just pick a date and time and he will show up." I also attempted to educate pt on increased risk for stroke in AF as well as risk/benefits of beginning anticoagulation therapy. He stated "this is TIA." Pt states he will call back with additional questions.   Discount card and appointment times were left at the front desk.

## 2018-08-24 ENCOUNTER — Telehealth: Payer: Self-pay | Admitting: Internal Medicine

## 2018-08-24 NOTE — Telephone Encounter (Signed)
New Message    Pt c/o medication issue:  1. Name of Medication: Eliquis  2. How are you currently taking this medication (dosage and times per day)? 5mg  twice a day  3. Are you having a reaction (difficulty breathing--STAT)? No  4. What is your medication issue? Since patient has been taking the medication he's been experiencing pain and bruising in left upper arm and shoulder.

## 2018-08-26 NOTE — Telephone Encounter (Signed)
Returned call to Pt.  Per Pt he went to see his PCP when his shoulder/arm pain turned into a large bruise.  Per Pt his PCP reduced his Eliquis to 2.5 mg BID for 2 weeks.  Was advised to return to Eliquis 5 mg BID after the 2 week period.  Pt sees Dr. [place] 09/02/2018.  Pt is doing ok at this time, thinks bruising looks like it is improving.  No further action needed at this time.

## 2018-08-30 ENCOUNTER — Ambulatory Visit (INDEPENDENT_AMBULATORY_CARE_PROVIDER_SITE_OTHER): Payer: Medicare Other

## 2018-08-30 DIAGNOSIS — I639 Cerebral infarction, unspecified: Secondary | ICD-10-CM | POA: Diagnosis not present

## 2018-08-31 LAB — CUP PACEART REMOTE DEVICE CHECK
Implantable Pulse Generator Implant Date: 20190903
MDC IDC SESS DTM: 20200113131032

## 2018-08-31 NOTE — Progress Notes (Signed)
Carelink Summary Report / Loop Recorder 

## 2018-08-31 NOTE — Progress Notes (Signed)
Cardiology Office Note Date:  09/02/2018   ID:  Edward [name], DOB 21-Sep-1940, MRN [id number]  PCP:  Chilton GreathouseAvva, Ravisankar, MD  Cardiologist:  Georga HackingW Spencer Tilley, MD  Chief Complaint  Patient presents with  . Coronary Artery Disease  . Atrial Fibrillation   History of Present Illness: Edward [name] who is seen for evaluation of CAD. He is a patient of  Dr. Donnie Ahoilley.   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 This was his second episode, the previous occurring approximately one year earlier. He had been compliant with aspirin prior to the first event, and has subsequently been compliant with plavix which was started one year ago at the time of his first event.  His evaluation included an echocardiogram, carotid ultrasound, CT and MRI studies of the brain.  All of these were unrevealing.  There was no evidence of cortical infarction on his neuroimaging studies.  He had no arrhythmia documented.  His  TEE demonstrated a small PFO. He was seen by Dr. Excell Seltzerooper for consideration of PFO closure but after discussion with Neurology this was not felt to be indication. He subsequently had an implantable loop recorder placed by Dr Ladona Ridgelaylor. There is a nursing note from Jan 3 stating he had an episode of AFib and he was started on Eliquis. 2 days after starting Eliquis he developed a hematoma in his left shoulder. Eliquis was reduced for 2 weeks then increase back to 5 mg bid. Plavix was stoppped.   He has a history of CAD with remote angioplasty of the LAD in 1995. He reports he had a stress test with Dr. Donnie Ahoilley one year ago. He really has never had recurrent angina. He does have  a history of vertigo.   He denies any chest pain, palpitations, dizziness, syncope, or dyspnea.    Past Medical History:  Diagnosis Date  . Arthritis   . At risk for sleep apnea    STOP-BANG = 4    SENT TO PCP 02-10-215  . BPH (benign prostatic  hypertrophy)   . Coronary artery disease CARIOLOGIST-- DR Donnie AhoILLEY   ANGIOPLASTY TO LAD  . Elevated PSA   . Frequency of urination   . History of kidney stones   . Hyperlipidemia   . Hypertension   . Nocturia   . Spermatocele    left  . Stroke (HCC)   . Urgency of urination   . Vertigo     Past Surgical History:  Procedure Laterality Date  . CORONARY ANGIOPLASTY  1995  DR [place]   LAD  . CYSTOSCOPY N/A 09/18/2015   Procedure: CYSTOSCOPY;  Surgeon: Jerilee FieldMatthew Eskridge, MD;  Location: [facility];  Service: Urology;  Laterality: N/A;  . CYSTOSCOPY WITH RETROGRADE PYELOGRAM, URETEROSCOPY AND STENT PLACEMENT Left 03/01/2013   Procedure: CYSTOSCOPY WITH LEFT RETROGRADE PYELOGRAM, LEFT URETEROSCOPY and stent PLACEMENT;  Surgeon: Antony HasteMatthew Ramsey Eskridge, MD;  [facility];  Service: Urology;  Laterality: Left;  . FOOT FUSION Left    SECONDARY TO FX'S  . GREEN LIGHT LASER TURP (TRANSURETHRAL RESECTION OF PROSTATE N/A 10/04/2013   Procedure: GREEN LIGHT LASER TURP (TRANSURETHRAL RESECTION OF PROSTATE;  Surgeon: Antony HasteMatthew Ramsey Eskridge, MD;  Location: [facility];  Service: Urology;  Laterality: N/A;  . INGUINAL HERNIA REPAIR Bilateral   . LOOP RECORDER INSERTION N/A 04/20/2018   Procedure: LOOP RECORDER INSERTION;  Surgeon: Marinus Mawaylor, Gregg W, MD;  [facility];  Service: Cardiovascular;  Laterality: N/A;  . SHOULDER ARTHROSCOPY Right 2004  . SHOULDER ARTHROSCOPY WITH DEBRIDEMENT AND BICEP TENDON REPAIR Left 04-28-2011  . SPERMATOCELECTOMY Left 10/04/2013   Procedure: LEFT SPERMATOCELECTOMY;  Surgeon: Antony Haste, MD;  Location: [facility];  Service: Urology;  Laterality: Left;  . TEE WITHOUT CARDIOVERSION N/A 02/16/2018   Procedure: TRANSESOPHAGEAL ECHOCARDIOGRAM (TEE);  Surgeon: Chrystie Nose, MD;  [facility];  Service: Cardiovascular;  Laterality: N/A;  . TRANSURETHRAL RESECTION OF PROSTATE N/A 12/11/2014   Procedure: CYSTOSCOPY, CLOT EVACUATION,  WITH FULGURATION ;  Surgeon: Bjorn Pippin, MD;  Location: [facility];  Service: Urology;  Laterality: N/A;  . TRANSURETHRAL RESECTION OF PROSTATE N/A 09/18/2015   Procedure: TRANSURETHRAL RESECTION OF THE PROSTATE (TURP);  Surgeon: Jerilee Field, MD;  Location: [facility];  Service: Urology;  Laterality: N/A;  . UMBILICAL HERNIA REPAIR  06-02-2008    Current Outpatient Medications  Medication Sig Dispense Refill  . amLODipine (NORVASC) 5 MG tablet Take 5 mg by mouth daily.     Marland Kitchen apixaban (ELIQUIS) 5 MG TABS tablet Take 1 tablet (5 mg total) by mouth 2 (two) times daily. 60 tablet 3  . apixaban (ELIQUIS) 5 MG TABS tablet Take 1 tablet (5 mg total) by mouth 2 (two) times daily. 180 tablet 3  . budesonide-formoterol (SYMBICORT) 160-4.5 MCG/ACT inhaler Inhale 1 puff into the lungs 2 (two) times daily as needed (for shortness of breath).    . Coenzyme Q10 (CO Q 10) 100 MG CAPS Take 100 mg by mouth daily.     . Glucosamine HCl (SM GLUCOSAMINE HCL) 1500 MG TABS Take 1,500 mg by mouth daily.    Marland Kitchen losartan (COZAAR) 100 MG tablet Take 100 mg by mouth daily.     . Omega-3 Fatty Acids (FISH OIL) 1000 MG CAPS Take 1,000 mg by mouth daily.     . rosuvastatin (CRESTOR) 20 MG tablet Take 0.5 tablets (10 mg total) by mouth every evening. (Patient taking differently: Take 20 mg by mouth every evening. ) 30 tablet 0   No current facility-administered medications for this visit.     Allergies:   Patient has no known allergies.   Social History:  The patient  reports that he quit smoking about 47 years ago. His smoking use included cigarettes. He has a 1.50 pack-year smoking history. He has never used smokeless tobacco. He reports current alcohol use. He reports that he does not use drugs.   Family History:  The patient's family history is not on file.    ROS:  Please see the history of present illness.  All other systems are reviewed and negative.    PHYSICAL EXAM: VS:  BP (!) 132/58   Pulse 85   Ht 5\' 6"   (1.676 m)   Wt 162 lb 9.6 oz (73.8 kg)   BMI 26.24 kg/m  , BMI Body mass index is 26.24 kg/m. GEN: Well nourished, well developed, in no acute distress  HEENT: normal  Neck: no JVD, no masses. No carotid bruits Cardiac: RRR  No gallop or murmur.          Respiratory:  clear to auscultation bilaterally, normal work of breathing GI: soft, nontender, nondistended, + BS MS: no deformity or atrophy. There is a large mature bruise in the left shoulder and upper arm. Ext: no pretibial edema, pedal pulses 2+= bilaterally Skin: warm and dry, no rash Neuro:  Strength and sensation are intact Psych: euthymic mood, full affect  EKG:  EKG is ordered today. NSR with PACs. Otherwise normal. I have personally reviewed and interpreted this study.   Recent Labs: 01/14/2018: ALT 21; BUN 15; Creatinine, Ser 0.90; Hemoglobin 12.6; Platelets 183; Potassium 4.2; Sodium 141   Lipid Panel     Component Value Date/Time   CHOL 117 01/15/2018 0453   TRIG 64 01/15/2018 0453   HDL 41 01/15/2018 0453   CHOLHDL 2.9 01/15/2018 0453   VLDL 13 01/15/2018 0453   LDLCALC 63 01/15/2018 0453   Dated 04/09/18: cholesterol 134, triglycerides 87, HDL 45, LDL 72. CBC, chemistries, TSH normal.  Wt Readings from Last 3 Encounters:  09/02/18 162 lb 9.6 oz (73.8 kg)  04/20/18 160 lb (72.6 kg)  03/15/18 160 lb 12.8 oz (72.9 kg)     Cardiac Studies Reviewed: TEE 02-16-2018: Study Conclusions  - Left ventricle: The cavity size was normal. There was mild   concentric hypertrophy. Systolic function was normal. The   estimated ejection fraction was in the range of 55% to 60%. Wall   motion was normal; there were no regional wall motion   abnormalities. - Aortic valve: Trileaflet. Mild to moderate eccentric AI, 2 jets,   the larger jet is toward the anterior mitral valve leaflet. - Aorta: Dilated sinus of valsalva to 4.1 cm. - Left atrium: Mildly dilated. No evidence of thrombus in the   atrial cavity or appendage. -  Right atrium: No evidence of thrombus in the atrial cavity or   appendage. - Atrial septum: There is a small PFO by saline microbubble   contrast which demonstrates intermittent right to left flow.  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 This can be a possible mechanism for his   TIA. Consider referral to Dr. Excell Seltzerooper with the structural heart   clinic for closure evaluation.  Assessment/Plan:  1. CAD remote PTCA of the LAD in 1995. No recurrent angina. Last stress test about a year ago. Continue amlodipine and statin. No antiplatelet therapy due to being on Eliquis 2. Paroxysmal Afib. Apparently noted on loop recorder. Unable to locate report in record. He is completely asymptomatic. Will need lifelong anticoagulation. On Eliquis. I suspect spontaneous hematoma in left shoulder related to combination Eliquis and Plavix at that time. Plavix discontinued. No other arrhythmia therapy needed unless he develops symptomatic Afib.  3. HLD excellent control on Crestor 4. HTN well controlled.   I will follow up in 6 months  Signed, Peter SwazilandJordan, MD  09/02/2018 3:17 PM    [facility] [address], [place], [place]  [address] Phone: [phone]; Fax: [phone]

## 2018-09-02 ENCOUNTER — Ambulatory Visit: Payer: Medicare Other | Admitting: Cardiology

## 2018-09-02 ENCOUNTER — Encounter: Payer: Self-pay | Admitting: Cardiology

## 2018-09-02 VITALS — BP 132/58 | HR 85 | Ht 66.0 in | Wt 162.6 lb

## 2018-09-02 DIAGNOSIS — I480 Paroxysmal atrial fibrillation: Secondary | ICD-10-CM | POA: Diagnosis not present

## 2018-09-02 DIAGNOSIS — I2510 Atherosclerotic heart disease of native coronary artery without angina pectoris: Secondary | ICD-10-CM

## 2018-09-02 DIAGNOSIS — G459 Transient cerebral ischemic attack, unspecified: Secondary | ICD-10-CM | POA: Diagnosis not present

## 2018-09-02 DIAGNOSIS — Q211 Atrial septal defect: Secondary | ICD-10-CM | POA: Diagnosis not present

## 2018-09-02 DIAGNOSIS — Q2112 Patent foramen ovale: Secondary | ICD-10-CM

## 2018-09-05 LAB — CUP PACEART REMOTE DEVICE CHECK
Date Time Interrogation Session: 20191211113634
Implantable Pulse Generator Implant Date: 20190903

## 2018-09-14 ENCOUNTER — Ambulatory Visit: Payer: Self-pay | Admitting: Adult Health

## 2018-09-14 ENCOUNTER — Ambulatory Visit: Payer: Medicare Other | Admitting: Adult Health

## 2018-09-14 ENCOUNTER — Telehealth: Payer: Self-pay

## 2018-09-14 NOTE — Telephone Encounter (Signed)
Patient called stating that he was sick and would be unable to come in for his appointment today.

## 2018-09-15 ENCOUNTER — Encounter: Payer: Self-pay | Admitting: Adult Health

## 2018-09-17 ENCOUNTER — Ambulatory Visit (HOSPITAL_COMMUNITY)
Admission: RE | Admit: 2018-09-17 | Discharge: 2018-09-17 | Disposition: A | Discharge status: Home / Self Care | Payer: Medicare Other | Source: Ambulatory Visit | Attending: Nurse Practitioner | Admitting: Nurse Practitioner

## 2018-09-17 ENCOUNTER — Encounter (HOSPITAL_COMMUNITY): Payer: Self-pay | Admitting: Nurse Practitioner

## 2018-09-17 VITALS — BP 124/62 | HR 85 | Ht 66.0 in | Wt 159.0 lb

## 2018-09-17 DIAGNOSIS — Z7901 Long term (current) use of anticoagulants: Secondary | ICD-10-CM | POA: Insufficient documentation

## 2018-09-17 DIAGNOSIS — I480 Paroxysmal atrial fibrillation: Secondary | ICD-10-CM | POA: Diagnosis not present

## 2018-09-17 DIAGNOSIS — Z87891 Personal history of nicotine dependence: Secondary | ICD-10-CM | POA: Diagnosis not present

## 2018-09-17 DIAGNOSIS — I2510 Atherosclerotic heart disease of native coronary artery without angina pectoris: Secondary | ICD-10-CM | POA: Diagnosis not present

## 2018-09-17 DIAGNOSIS — I10 Essential (primary) hypertension: Secondary | ICD-10-CM | POA: Diagnosis not present

## 2018-09-17 DIAGNOSIS — E785 Hyperlipidemia, unspecified: Secondary | ICD-10-CM | POA: Diagnosis not present

## 2018-09-17 DIAGNOSIS — Z79899 Other long term (current) drug therapy: Secondary | ICD-10-CM | POA: Diagnosis not present

## 2018-09-17 LAB — BASIC METABOLIC PANEL
Anion gap: 9 (ref 5–15)
BUN: 14 mg/dL (ref 8–23)
CALCIUM: 10 mg/dL (ref 8.9–10.3)
CO2: 26 mmol/L (ref 22–32)
Chloride: 104 mmol/L (ref 98–111)
Creatinine, Ser: 1.03 mg/dL (ref 0.61–1.24)
GFR calc Af Amer: >60 mL/min (ref >60)
Glucose, Bld: 95 mg/dL (ref 70–99)
Potassium: 4.4 mmol/L (ref 3.5–5.1)
Sodium: 139 mmol/L (ref 135–145)

## 2018-09-17 LAB — CBC
HCT: 39.2 % (ref 39.0–52.0)
Hemoglobin: 12.9 g/dL — ABNORMAL LOW (ref 13.0–17.0)
MCH: 31.1 pg (ref 26.0–34.0)
MCHC: 32.9 g/dL (ref 30.0–36.0)
MCV: 94.5 fL (ref 80.0–100.0)
Platelets: 211 10*3/uL (ref 150–400)
RBC: 4.15 MIL/uL — ABNORMAL LOW (ref 4.22–5.81)
RDW: 12.5 % (ref 11.5–15.5)
WBC: 5.2 10*3/uL (ref 4.0–10.5)
nRBC: 0 % (ref 0.0–0.2)

## 2018-09-17 NOTE — Progress Notes (Signed)
Primary Care Physician: [name], MD Referring Physician: Dr. Sharion Settler is a 78 y.o. male with a h/o of CAD, CVA that is in the afib clinic for startup of Eliquis early January. He had a Linq inserted last September after cryptogenic stroke and previous aphasia episodes x 2. He had afib that occurred for 2 hours 08/02/18 with HR's 88-167 bpm. Pt was completely unaware. He did have a hematoma that occurred left shoulder( prior surgeries) with plavix stopped, but still in his sytem and staring eliquis. His PCP reduced eliquis to 2.5 mg bid for 2 weeks then he resumed full anticoagulation. He has not had any further bleeding issues.  Today, he denies symptoms of palpitations, chest pain, shortness of breath, orthopnea, PND, lower extremity edema, dizziness, presyncope, syncope, or neurologic sequela. The patient is tolerating medications without difficulties and is otherwise without complaint today.   Past Medical History:  Diagnosis Date  . Arthritis   . At risk for sleep apnea    STOP-BANG = 4    SENT TO PCP 02-10-215  . BPH (benign prostatic hypertrophy)   . Coronary artery disease CARIOLOGIST-- DR Donnie Aho   ANGIOPLASTY TO LAD  . Elevated PSA   . Frequency of urination   . History of kidney stones   . Hyperlipidemia   . Hypertension   . Nocturia   . Spermatocele    left  . Stroke (HCC)   . Urgency of urination   . Vertigo    Past Surgical History:  Procedure Laterality Date  . CORONARY ANGIOPLASTY  1995  DR [place]   LAD  . CYSTOSCOPY N/A 09/18/2015   Procedure: CYSTOSCOPY;  Surgeon: Jerilee Field, MD;  Location: [facility];  Service: Urology;  Laterality: N/A;  . CYSTOSCOPY WITH RETROGRADE PYELOGRAM, URETEROSCOPY AND STENT PLACEMENT Left 03/01/2013   Procedure: CYSTOSCOPY WITH LEFT RETROGRADE PYELOGRAM, LEFT URETEROSCOPY and stent PLACEMENT;  Surgeon: Antony Haste, MD;  Location: [facility];  Service: Urology;  Laterality: Left;  .  FOOT FUSION Left    SECONDARY TO FX'S  . GREEN LIGHT LASER TURP (TRANSURETHRAL RESECTION OF PROSTATE N/A 10/04/2013   Procedure: GREEN LIGHT LASER TURP (TRANSURETHRAL RESECTION OF PROSTATE;  Surgeon: Antony Haste, MD;  Location: [facility];  Service: Urology;  Laterality: N/A;  . INGUINAL HERNIA REPAIR Bilateral   . LOOP RECORDER INSERTION N/A 04/20/2018   Procedure: LOOP RECORDER INSERTION;  Surgeon: Marinus Maw, MD;  [facility];  Service: Cardiovascular;  Laterality: N/A;  . SHOULDER ARTHROSCOPY Right 2004  . SHOULDER ARTHROSCOPY WITH DEBRIDEMENT AND BICEP TENDON REPAIR Left 04-28-2011  . SPERMATOCELECTOMY Left 10/04/2013   Procedure: LEFT SPERMATOCELECTOMY;  Surgeon: Antony Haste, MD;  [facility];  Service: Urology;  Laterality: Left;  . TEE WITHOUT CARDIOVERSION N/A 02/16/2018   Procedure: TRANSESOPHAGEAL ECHOCARDIOGRAM (TEE);  Surgeon: Chrystie Nose, MD;  Location: [facility];  Service: Cardiovascular;  Laterality: N/A;  . TRANSURETHRAL RESECTION OF PROSTATE N/A 12/11/2014   Procedure: CYSTOSCOPY, CLOT EVACUATION, WITH FULGURATION ;  Surgeon: Bjorn Pippin, MD;  Location: [facility];  Service: Urology;  Laterality: N/A;  . TRANSURETHRAL RESECTION OF PROSTATE N/A 09/18/2015   Procedure: TRANSURETHRAL RESECTION OF THE PROSTATE (TURP);  Surgeon: Jerilee Field, MD;  Location: [facility];  Service: Urology;  Laterality: N/A;  . UMBILICAL HERNIA REPAIR  06-02-2008    Current Outpatient Medications  Medication Sig Dispense Refill  . amLODipine (NORVASC) 5 MG tablet Take 5 mg  by mouth daily.     Marland Kitchen. apixaban (ELIQUIS) 5 MG TABS tablet Take 1 tablet (5 mg total) by mouth 2 (two) times daily. 180 tablet 3  . budesonide-formoterol (SYMBICORT) 160-4.5 MCG/ACT inhaler Inhale 1 puff into the lungs 2 (two) times daily as needed (for shortness of breath).    . Coenzyme Q10 (CO Q 10) 100 MG CAPS Take 100 mg by mouth daily.     . Glucosamine HCl (SM GLUCOSAMINE HCL) 1500  MG TABS Take 1,500 mg by mouth daily.    Marland Kitchen. losartan (COZAAR) 100 MG tablet Take 100 mg by mouth daily.     . Omega-3 Fatty Acids (FISH OIL) 1000 MG CAPS Take 1,000 mg by mouth daily.     . rosuvastatin (CRESTOR) 20 MG tablet Take 0.5 tablets (10 mg total) by mouth every evening. (Patient taking differently: Take 20 mg by mouth every evening. ) 30 tablet 0   No current facility-administered medications for this encounter.     No Known Allergies  Social History   Socioeconomic History  . Marital status: Married    Spouse name: Not on file  . Number of children: 0  . Years of education: Not on file  . Highest education level: Not on file  Occupational History  . 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 Financial resource strain: Not on file  . Food insecurity:    Worry: Not on file    Inability: Not on file  . Transportation needs:    Medical: Not on file    Non-medical: Not on file  Tobacco Use  . Smoking status: Former Smoker    Packs/day: 0.50    Years: 3.00    Pack years: 1.50    Types: Cigarettes    Last attempt to quit: 12/18/1970    Years since quitting: 47.7  . Smokeless tobacco: Never Used  Substance and Sexual Activity  . Alcohol use: Yes    Comment: OCCASIONAL  . Drug use: No  . Sexual activity: Not on file  Lifestyle  . Physical activity:    Days per week: Not on file    Minutes per session: Not on file  . Stress: Not on file  Relationships  . Social connections:    Talks on phone: Not on file    Gets together: Not on file    Attends religious service: Not on file    Active member of club or organization: Not on file    Attends meetings of clubs or organizations: Not on file    Relationship status: Not on file  . Intimate partner violence:    Fear of current or ex partner: Not on file    Emotionally abused: Not on file    Physically abused: Not on file    Forced sexual activity: Not on file  Other Topics Concern  .  Not on file  Social History Narrative  . Not on file    No family history on file.  ROS- All systems are reviewed and negative except as per the HPI above  Physical Exam: Vitals:   09/17/18 0847  BP: 124/62  Pulse: 85  Weight: 72.1 kg  Height: 5\' 6"  (1.676 m)   Wt Readings from Last 3 Encounters:  09/17/18 72.1 kg  09/02/18 73.8 kg  04/20/18 72.6 kg    Labs: Lab Results  Component Value Date   NA 141 01/14/2018   K 4.2 01/14/2018   CL 107  01/14/2018   CO2 23 01/14/2018   GLUCOSE 117 (H) 01/14/2018   BUN 15 01/14/2018   CREATININE 0.90 01/14/2018   CALCIUM 9.3 01/14/2018   Lab Results  Component Value Date   INR 1.01 01/14/2018   Lab Results  Component Value Date   CHOL 117 01/15/2018   HDL 41 01/15/2018   LDLCALC 63 01/15/2018   TRIG 64 01/15/2018     GEN- The patient is well appearing, alert and oriented x 3 today.   Head- normocephalic, atraumatic Eyes-  Sclera clear, conjunctiva pink Ears- hearing intact Oropharynx- clear Neck- supple, no JVP Lymph- no cervical lymphadenopathy Lungs- Clear to ausculation bilaterally, normal work of breathing Heart- Regular rate and rhythm, no murmurs, rubs or gallops, PMI not laterally displaced GI- soft, NT, ND, + BS Extremities- no clubbing, cyanosis, or edema MS- no significant deformity or atrophy Skin- no rash or lesion Psych- euthymic mood, full affect Neuro- strength and sensation are intact  EKG-NSR at 85 bpm, PR int 166 ms, qrs int 90 ms, qtc 454 ms Epic records reviewed Paceart report reviewed   Assessment and Plan: 1. Paroxysmal afib asymptomatic, low afib burden Per linq s/p CVA 04/2018 He will continue eliquis 5 mg bid Bleeding precautions discussed No NSAIDS Tylenol for pain if needed Report any rectal or bladder bleeding to PCP Fall precautions and if falls and strikes head, he has been directed to go to the ER for evaluation  Cbc/bmet today  Continue with device clinic per remote checks   F/u with PCP/Dr. [place] as per recall afib clinic if needed  Elvina Sidleonna C. Matthew Folksarroll, ANP-C Afib Clinic [facility] [address] [place], [place] [address] [phone]

## 2018-09-21 ENCOUNTER — Ambulatory Visit: Payer: Medicare Other | Admitting: Adult Health

## 2018-10-04 ENCOUNTER — Ambulatory Visit (INDEPENDENT_AMBULATORY_CARE_PROVIDER_SITE_OTHER): Payer: Medicare Other

## 2018-10-04 DIAGNOSIS — I639 Cerebral infarction, unspecified: Secondary | ICD-10-CM

## 2018-10-04 DIAGNOSIS — I480 Paroxysmal atrial fibrillation: Secondary | ICD-10-CM

## 2018-10-04 LAB — CUP PACEART REMOTE DEVICE CHECK
Date Time Interrogation Session: 20200215130716
Implantable Pulse Generator Implant Date: 20190903

## 2018-10-13 NOTE — Progress Notes (Signed)
Carelink Summary Report / Loop Recorder 

## 2018-11-02 ENCOUNTER — Other Ambulatory Visit: Payer: Self-pay | Admitting: Internal Medicine

## 2018-11-04 ENCOUNTER — Ambulatory Visit (INDEPENDENT_AMBULATORY_CARE_PROVIDER_SITE_OTHER): Payer: Medicare Other | Admitting: *Deleted

## 2018-11-04 ENCOUNTER — Other Ambulatory Visit: Payer: Self-pay

## 2018-11-04 DIAGNOSIS — I639 Cerebral infarction, unspecified: Secondary | ICD-10-CM

## 2018-11-04 LAB — CUP PACEART REMOTE DEVICE CHECK
Date Time Interrogation Session: 20200319125932
MDC IDC PG IMPLANT DT: 20190903

## 2018-11-11 NOTE — Progress Notes (Signed)
Carelink Summary Report / Loop Recorder 

## 2018-11-15 ENCOUNTER — Other Ambulatory Visit: Payer: Self-pay | Admitting: Cardiology

## 2018-11-15 MED ORDER — AMLODIPINE BESYLATE 5 MG PO TABS: 5.0000 mg | ORAL_TABLET | Freq: Every day | ORAL | 2 refills | Status: DC

## 2018-11-15 NOTE — Telephone Encounter (Signed)
°*  STAT* If patient is at the pharmacy, call can be transferred to refill team.   1. Which medications need to be refilled? (please list name of each medication and dose if known) Amlodipine 5mg  tablet once daily  2. Which pharmacy/location (including street and city if local pharmacy) is medication to be sent to?[facility]  3. Do they need a 30 day or 90 day supply? 90

## 2018-11-15 NOTE — Telephone Encounter (Signed)
Patient of Edward Mink, NP

## 2018-12-07 ENCOUNTER — Ambulatory Visit (INDEPENDENT_AMBULATORY_CARE_PROVIDER_SITE_OTHER): Payer: Medicare Other | Admitting: *Deleted

## 2018-12-07 ENCOUNTER — Other Ambulatory Visit: Payer: Self-pay

## 2018-12-07 DIAGNOSIS — I639 Cerebral infarction, unspecified: Secondary | ICD-10-CM | POA: Diagnosis not present

## 2018-12-07 DIAGNOSIS — I480 Paroxysmal atrial fibrillation: Secondary | ICD-10-CM

## 2018-12-07 LAB — CUP PACEART REMOTE DEVICE CHECK
Date Time Interrogation Session: 20200421132043
Implantable Pulse Generator Implant Date: 20190903

## 2018-12-13 NOTE — Progress Notes (Signed)
Carelink Summary Report / Loop Recorder 

## 2018-12-22 ENCOUNTER — Telehealth: Payer: Self-pay

## 2018-12-22 NOTE — Telephone Encounter (Signed)
I called pt and his wife that visit will be video due to COVID 19. I receive verbal consent from wife and pt to do video and file insurance. I explain to not come to office at this time. The wife verified her cell phone and it has a camera on it. She also knows how to send an receive text messages.I stated provider will text her a link 5 10 min prior to visit to click on.Click thelink 5-10 minutes before pts  scheduled appt time, enter his  First and Last name, and the doctor will be with you shortly. I stated to make sure she click video and turn audio on. They verbalized understanding.

## 2018-12-27 ENCOUNTER — Ambulatory Visit (INDEPENDENT_AMBULATORY_CARE_PROVIDER_SITE_OTHER): Payer: Medicare Other | Admitting: Adult Health

## 2018-12-27 ENCOUNTER — Other Ambulatory Visit: Payer: Self-pay

## 2018-12-27 DIAGNOSIS — Z0289 Encounter for other administrative examinations: Secondary | ICD-10-CM

## 2018-12-27 NOTE — Progress Notes (Unsigned)
[facility] [address] [place]. [place] [address] [phone]       FOLLOW UP NOTE  [name] Date of Birth:  09-30-1940 Medical Record Number:  [id number]   Reason for visit: TIA follow up  CHIEF COMPLAINT:  No chief complaint on file.   HPI: Edward [name] is a 78 y.o. male with underlying medical history of CAD s/p stent, HTN, HLD, prior TIA and TIA in 12/2017 .  He was initially scheduled today for office stroke follow-up visit but due to COVID-19 safety precautions, visit transition to telemedicine via [email or web address] with patient's consent.  He has been stable from a stroke standpoint without new or recurring stroke/TIA symptoms.  Patient underwent TEE on 02/16/2018 and was positive for small PFO therefore loop recorder canceled for evaluation of potential PFO closure.  After evaluation with Dr. Excell Seltzer and discussion with Dr. Pearlean Brownie, it was felt as though PFO was not likely the cause of his stroke and therefore loop recorder placed.  On 08/20/2018, loop recorder showed episode of atrial fibrillation and was started on Eliquis.  2 days after initiating Eliquis, developed spontaneous hematoma in left shoulder likely related to combination of Eliquis and Plavix.  Resolution of hematoma with restarting on Eliquis and discontinuing Plavix without additional issues with bleeding or bruising.  Continues on Crestor without side effects myalgias.  Blood pressure ***.      ROS:   14 system review of systems performed and negative with exception of blurred vision, easy bruising, dizziness and disinterested activities  PMH:  Past Medical History:  Diagnosis Date  . Arthritis   . At risk for sleep apnea    STOP-BANG = 4    SENT TO PCP 02-10-215  . BPH (benign prostatic hypertrophy)   . Coronary artery disease CARIOLOGIST-- DR Donnie Aho   ANGIOPLASTY TO LAD  . Elevated PSA   . Frequency of urination   . History of kidney stones   . Hyperlipidemia   . Hypertension   .  Nocturia   . Spermatocele    left  . Stroke (HCC)   . Urgency of urination   . Vertigo     PSH:  Past Surgical History:  Procedure Laterality Date  . CORONARY ANGIOPLASTY  1995  DR [place]   LAD  . CYSTOSCOPY N/A 09/18/2015   Procedure: CYSTOSCOPY;  Surgeon: Jerilee Field, MD;  Location: [facility];  Service: Urology;  Laterality: N/A;  . CYSTOSCOPY WITH RETROGRADE PYELOGRAM, URETEROSCOPY AND STENT PLACEMENT Left 03/01/2013   Procedure: CYSTOSCOPY WITH LEFT RETROGRADE PYELOGRAM, LEFT URETEROSCOPY and stent PLACEMENT;  Surgeon: Antony Haste, MD;  Location: [facility];  Service: Urology;  Laterality: Left;  . FOOT FUSION Left    SECONDARY TO FX'S  . GREEN LIGHT LASER TURP (TRANSURETHRAL RESECTION OF PROSTATE N/A 10/04/2013   Procedure: GREEN LIGHT LASER TURP (TRANSURETHRAL RESECTION OF PROSTATE;  Surgeon: Antony Haste, MD;  Location: [facility];  Service: Urology;  Laterality: N/A;  . INGUINAL HERNIA REPAIR Bilateral   . LOOP RECORDER INSERTION N/A 04/20/2018   Procedure: LOOP RECORDER INSERTION;  Surgeon: Marinus Maw, MD;  [facility];  Service: Cardiovascular;  Laterality: N/A;  . SHOULDER ARTHROSCOPY Right 2004  . SHOULDER ARTHROSCOPY WITH DEBRIDEMENT AND BICEP TENDON REPAIR Left 04-28-2011  . SPERMATOCELECTOMY Left 10/04/2013   Procedure: LEFT SPERMATOCELECTOMY;  Surgeon: Antony Haste, MD;  [facility];  Service: Urology;  Laterality: Left;  . TEE WITHOUT CARDIOVERSION N/A  02/16/2018   Procedure: TRANSESOPHAGEAL ECHOCARDIOGRAM (TEE);  Surgeon: Chrystie Nose, MD;  Location: [facility];  Service: Cardiovascular;  Laterality: N/A;  . TRANSURETHRAL RESECTION OF PROSTATE N/A 12/11/2014   Procedure: CYSTOSCOPY, CLOT EVACUATION, WITH FULGURATION ;  Surgeon: Bjorn Pippin, MD;  Location: [facility];  Service: Urology;  Laterality: N/A;  . TRANSURETHRAL RESECTION OF PROSTATE N/A 09/18/2015   Procedure: TRANSURETHRAL RESECTION OF THE  PROSTATE (TURP);  Surgeon: Jerilee Field, MD;  Location: [facility];  Service: Urology;  Laterality: N/A;  . UMBILICAL HERNIA REPAIR  06-02-2008    Social History:  Social History   Socioeconomic History  . Marital status: Married    Spouse name: Not on file  . Number of children: 0  . Years of education: Not on file  . Highest education level: Not on file  Occupational History  . Occupation: Technical sales engineer    Comment: retired  Engineer, production  . Financial resource strain: Not on file  . Food insecurity:    Worry: Not on file    Inability: Not on file  . Transportation needs:    Medical: Not on file    Non-medical: Not on file  Tobacco Use  . Smoking status: Former Smoker    Packs/day: 0.50    Years: 3.00    Pack years: 1.50    Types: Cigarettes    Last attempt to quit: 12/18/1970    Years since quitting: 48.0  . Smokeless tobacco: Never Used  Substance and Sexual Activity  . Alcohol use: Yes    Comment: OCCASIONAL  . Drug use: No  . Sexual activity: Not on file  Lifestyle  . Physical activity:    Days per week: Not on file    Minutes per session: Not on file  . Stress: Not on file  Relationships  . Social connections:    Talks on phone: Not on file    Gets together: Not on file    Attends religious service: Not on file    Active member of club or organization: Not on file    Attends meetings of clubs or organizations: Not on file    Relationship status: Not on file  . Intimate partner violence:    Fear of current or ex partner: Not on file    Emotionally abused: Not on file    Physically abused: Not on file    Forced sexual activity: Not on file  Other Topics Concern  . Not on file  Social History Narrative  . Not on file    Family History: No family history on file.  Medications:   Current Outpatient Medications on File Prior to Visit  Medication Sig Dispense Refill  . amLODipine (NORVASC) 5 MG tablet Take 1 tablet (5 mg total) by mouth daily.  90 tablet 2  . apixaban (ELIQUIS) 5 MG TABS tablet Take 1 tablet (5 mg total) by mouth 2 (two) times daily. 180 tablet 3  . budesonide-formoterol (SYMBICORT) 160-4.5 MCG/ACT inhaler Inhale 1 puff into the lungs 2 (two) times daily as needed (for shortness of breath).    . Coenzyme Q10 (CO Q 10) 100 MG CAPS Take 100 mg by mouth daily.     . Glucosamine HCl (SM GLUCOSAMINE HCL) 1500 MG TABS Take 1,500 mg by mouth daily.    Edward [name] Kitchen losartan (COZAAR) 100 MG tablet Take 100 mg by mouth daily.     . Omega-3 Fatty Acids (FISH OIL) 1000 MG CAPS Take 1,000 mg by mouth daily.     Edward [name] Kitchen  rosuvastatin (CRESTOR) 20 MG tablet Take 0.5 tablets (10 mg total) by mouth every evening. (Patient taking differently: Take 20 mg by mouth every evening. ) 30 tablet 0   No current facility-administered medications on file prior to visit.     Allergies:  No Known Allergies   Physical Exam  There were no vitals filed for this visit. There is no height or weight on file to calculate BMI. No exam data present  General: well developed, well nourished, pleasant elderly Caucasian male, seated, in no evident distress Head: head normocephalic and atraumatic.   Neck: supple with no carotid or supraclavicular bruits Cardiovascular: regular rate and rhythm, no murmurs Musculoskeletal: no deformity Skin:  no rash/petichiae Vascular:  Normal pulses all extremities  Neurologic Exam Mental Status: Awake and fully alert. Oriented to place and time. Recent and remote memory intact. Attention span, concentration and fund of knowledge appropriate. Mood and affect appropriate.  Cranial Nerves: Fundoscopic exam reveals sharp disc margins. Pupils equal, briskly reactive to light. Extraocular movements full without nystagmus. Visual fields full to confrontation. Hearing intact. Facial sensation intact. Face, tongue, palate moves normally and symmetrically.  Motor: Normal bulk and tone. Normal strength in all tested extremity muscles. Sensory.:  intact to touch , pinprick , position and vibratory sensation.  Coordination: Rapid alternating movements normal in all extremities. Finger-to-nose and heel-to-shin performed accurately bilaterally. Gait and Station: Arises from chair without difficulty. Stance is normal. Gait demonstrates normal stride length and balance . Able to heel, toe and tandem walk without difficulty.  Reflexes: 1+ and symmetric. Toes downgoing.      Diagnostic Data (Labs, Imaging, Testing)  CT HEAD WO CONTRAST 01/15/18 IMPRESSION: 1. No acute intracranial pathology seen on CT. 2. Mild cortical volume loss and scattered small vessel ischemic Microangiopathy.  MR BRAIN WO CONTRAST MR MRA HEAD WO CONTRAST MR MRA NECK W WO CONTRAST 01/15/18 IMPRESSION: 1. No acute intracranial abnormality. 2. Patent carotid and vertebral arteries. No dissection, aneurysm, or hemodynamically significant stenosis utilizing NASCET criteria. 3. Patent anterior and posterior intracranial circulation. No large vessel occlusion, aneurysm, or significant stenosis. 4. Mild for age chronic microvascular ischemic changes and parenchymal volume loss of the brain.  ECHOCARDIOGRAM 01/15/18 Study Conclusions - Left ventricle: The cavity size was normal. Systolic function was   normal. The estimated ejection fraction was in the range of 50%   to 55%. Wall motion was normal; there were no regional wall   motion abnormalities. - Aortic valve: AV is trileaflet with some calcification and   thickening Two jets of AR one central and one in periphery   between left and right cusps which is a bit   unusual. There was mild to moderate regurgitation. Valve area   (VTI): 2.32 cm^2. Valve area (Vmax): 2.95 cm^2. Valve area   (Vmean): 2.41 cm^2. - Mitral valve: Moderately calcified annulus. Moderately thickened   leaflets . There was mild regurgitation. Valve area by pressure   half-time: 1.26 cm^2. - Left atrium: The atrium was mildly dilated.  - Atrial septum: No defect or patent foramen ovale was identified.  ECHO TEE 02/16/18 Impressions: - Small PFO with intermittent right to left flow noted by saline   microbubble contrast. This can be a possible mechanism for his   TIA. Consider referral to Dr. Excell Seltzer with the structural heart   clinic for closure evaluation.     ASSESSMENT: KIVA LUEKEN is a 78 y.o. year old male here with TIA on 01/15/18 secondary to PFO. Vascular risk  factors include HTN and HLD.     PLAN: -Continue Eliquis (apixaban) daily  and Crestor 10 mg for secondary stroke prevention -F/u with PCP regarding your HLD and HTN management -Advised to continue to stay active and eat a healthy diet -continue to monitor BP at home -Maintain strict control of hypertension with blood pressure goal below 130/90, diabetes with hemoglobin A1c goal below 6.5% and cholesterol with LDL cholesterol (bad cholesterol) goal below 70 mg/dL. I also advised the patient to eat a healthy diet with plenty of whole grains, cereals, fruits and vegetables, exercise regularly and maintain ideal body weight.  Follow up in 6 months or call earlier if needed   Greater than 50% of time during this 25 minute visit was spent on counseling,explanation of diagnosis of TIA, reviewing risk factor management of HLD, HTN and PFO, planning of further management, discussion with patient and family and coordination of care    George HughJessica Bradleigh Sonnen, [facility] IncGNP-BC  [facility] [address] [place], [place] [id number]  Phone [phone] Fax [phone]

## 2019-01-03 ENCOUNTER — Other Ambulatory Visit: Payer: Self-pay | Admitting: Dermatology

## 2019-01-10 LAB — CUP PACEART REMOTE DEVICE CHECK
Date Time Interrogation Session: 20200524141017
Implantable Pulse Generator Implant Date: 20190903

## 2019-01-11 ENCOUNTER — Ambulatory Visit (INDEPENDENT_AMBULATORY_CARE_PROVIDER_SITE_OTHER): Payer: Medicare Other | Admitting: *Deleted

## 2019-01-11 DIAGNOSIS — I639 Cerebral infarction, unspecified: Secondary | ICD-10-CM | POA: Diagnosis not present

## 2019-01-11 DIAGNOSIS — G459 Transient cerebral ischemic attack, unspecified: Secondary | ICD-10-CM

## 2019-01-20 NOTE — Progress Notes (Signed)
Carelink Summary Report / Loop Recorder 

## 2019-02-01 ENCOUNTER — Telehealth: Payer: Self-pay

## 2019-02-01 NOTE — Telephone Encounter (Signed)
Pt states he was on his way here and he do not have an appointment. I told him if he do not have an appointment he will not be seen. We are limiting the people who are coming in the office to protect pt and staff from the virus. I told him to call me when he get home and I will be more than happy to help him. He wants to know why he never get results from his transmissions. He also wants to know why he get a bill from [facility]. Pt verbalized understanding and thanked me for speaking with him. I expressed to him I will try my best to direct him to someone who can answer his questions.

## 2019-02-03 NOTE — Telephone Encounter (Signed)
Pt called with needing help sending a manual transmission. Madison help him send one successfully. Pt also have questions about why he get billing from [facility] Madison told him that is a billing issue. He can talk to billing or call his insurance calling.    I am routing the phone note for Suanne Marker in billing to give the pt a phone call about his billing questions.  We are also routing the phone note to Dr. Lovena Le nurse. The pt have questions on when he should discontinue his loop recorder.

## 2019-02-03 NOTE — Telephone Encounter (Signed)
Returned [name] call (billing dept).  Details of call are in account notes in transaction inquiry.

## 2019-02-04 NOTE — Telephone Encounter (Deleted)
[facility]--rec'd phone message from patient regarding billing and his copays.  Explained that our statements do indeed come from [place].  Also explained about the copays for his monthly ILR remote checks were based on the allowed from his insurance and to call them to find how they determine the amount.  He agreed and will call his insurance.  Thanked me for the call. °

## 2019-02-04 NOTE — Telephone Encounter (Signed)
[facility]--rec'd phone message from patient regarding billing and his copays.  Explained that our statements do indeed come from [place].  Also explained about the copays for his monthly ILR remote checks were based on the allowed from his insurance and to call them to find how they determine the amount.  He agreed and will call his insurance.  Thanked me for the call.

## 2019-02-08 NOTE — Telephone Encounter (Signed)
Per Dr. Lovena Le- Pt may discontinue remote monitoring.  Have him put his remote box away.  Pt can be seen by device clinic for device check every 6 months.

## 2019-02-09 ENCOUNTER — Telehealth: Payer: Self-pay

## 2019-02-09 NOTE — Telephone Encounter (Addendum)
Opened in error

## 2019-02-11 NOTE — Telephone Encounter (Signed)
Per patient request, unenrolled from [facility] to avoid future charges/summary reports, return kit not ordered (in the event he wishes to resume remote monitoring in the future).

## 2019-03-18 ENCOUNTER — Encounter: Payer: Self-pay | Admitting: *Deleted

## 2019-03-18 NOTE — Progress Notes (Signed)
Patient walked-in to lobby requesting to speak with [facility] staff. Spoke with patient. He expressed concerns about his [employer] monitor "not working." Patient had called [employer] tech services earlier this week and was told his device is not enrolled. Explained to patient that as per documentation per 01/2019 phone conversations, our understanding was that he wished to discontinue remote monitoring due to cost. Patient reports he does not recall details of this conversation, but he confirms today that he would like to discontinue monitoring to avoid monthly bills for summary reports. Advised that last billed report was 12/2018. Patient is agreeable to coming in to the DC every 6 months for LINQ checks as per Dr. Tanna Furry recommendation, scheduled for 08/16/19 at 9:30am.  Patient expressed frustration that he never hears any results of his transmissions. Explained our protocols for LINQ monitoring for cryptogenic stroke, advised that he is always welcome to call our office for an update, explained we would call him if anything abnormal is detected. He reports he was unsuccessful reaching someone at our direct DC phone number this morning (as the phones were forwarded to the main number). Gave patient main office number and confirmed DC phone number, apologized for the wait times on the phone. Explained that now that he is coming into the office for device checks, he will receive a verbal overview of his results the same day. Patient verbalizes understanding and thanked me for taking the time to explain the situation to him. He left the office without further incident.

## 2019-03-25 ENCOUNTER — Other Ambulatory Visit: Payer: Self-pay

## 2019-03-27 MED ORDER — LOSARTAN POTASSIUM 100 MG PO TABS: 100.0000 mg | ORAL_TABLET | Freq: Every day | ORAL | 1 refills | Status: DC

## 2019-03-27 MED ORDER — ROSUVASTATIN CALCIUM 20 MG PO TABS: 20.0000 mg | ORAL_TABLET | Freq: Every evening | ORAL | 1 refills | Status: DC

## 2019-04-22 ENCOUNTER — Encounter: Payer: Self-pay | Admitting: Family Medicine

## 2019-04-24 NOTE — Progress Notes (Signed)
Cardiology Office Note Date:  04/29/2019   ID:  Edward [name], DOB Jun 07, 1941, MRN [id number]  PCP:  Prince Solian, MD  Cardiologist:  Ezzard Standing, MD  Chief Complaint  Patient presents with  . Coronary Artery Disease   History of Present Illness: Edward [name] is a 78 y.o. male who is seen for evaluation of CAD. He is a patient of  Dr. Wynonia Lawman.   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 This was his second episode, the previous occurring approximately one year earlier. He had been compliant with aspirin prior to the first event, and has subsequently been compliant with plavix which was started one year ago at the time of his first event.  His evaluation included an echocardiogram, carotid ultrasound, CT and MRI studies of the brain.  All of these were unrevealing.  There was no evidence of cortical infarction on his neuroimaging studies.  He had no arrhythmia documented.  His  TEE demonstrated a small PFO. He was seen by Dr. Burt Knack for consideration of PFO closure but after discussion with Neurology this was not felt to be indication. He subsequently had an implantable loop recorder placed by Dr Lovena Le. There is a nursing note from Jan 3 stating he had an episode of AFib and he was started on Eliquis. 2 days after starting Eliquis he developed a hematoma in his left shoulder. Eliquis was reduced for 2 weeks then increase back to 5 mg bid. Plavix was stoppped.   He has a history of CAD with remote angioplasty of the LAD in 1995. He reports he had a stress test with Dr. Wynonia Lawman a couple of years ago. He really has never had recurrent angina. He does have  a history of vertigo.   On follow up today he is doing well. Notes some feeling of dizziness. He has lost 5 lbs. Is active around his house. No chest pain, SOB, palpitations.    Past Medical History:  Diagnosis Date  . Arthritis   . At risk for sleep apnea    STOP-BANG = 4    SENT TO PCP 02-10-215  . BPH (benign prostatic hypertrophy)   . Coronary artery disease CARIOLOGIST-- DR Wynonia Lawman   ANGIOPLASTY TO LAD  . Elevated PSA   . Frequency of urination   . History of kidney stones   . Hyperlipidemia   . Hypertension   . Nocturia   . Spermatocele    left  . Stroke ([place])   . Urgency of urination   . Vertigo     Past Surgical History:  Procedure Laterality Date  . CORONARY ANGIOPLASTY  1995  DR [place]   LAD  . CYSTOSCOPY N/A 09/18/2015   Procedure: CYSTOSCOPY;  Surgeon: Festus Aloe, MD;  Location: [facility];  Service: Urology;  Laterality: N/A;  . CYSTOSCOPY WITH RETROGRADE PYELOGRAM, URETEROSCOPY AND STENT PLACEMENT Left 03/01/2013   Procedure: CYSTOSCOPY WITH LEFT RETROGRADE PYELOGRAM, LEFT URETEROSCOPY and stent PLACEMENT;  Surgeon: Fredricka Bonine, MD;  Location[facility];  Service: Urology;  Laterality: Left;  . FOOT FUSION Left    SECONDARY TO FX'S  . GREEN LIGHT LASER TURP (TRANSURETHRAL RESECTION OF PROSTATE N/A 10/04/2013   Procedure: GREEN LIGHT LASER TURP (TRANSURETHRAL RESECTION OF PROSTATE;  Surgeon: Fredricka Bonine, MD;  Location: [facility];  Service: Urology;  Laterality: N/A;  . INGUINAL HERNIA REPAIR Bilateral   . LOOP RECORDER INSERTION N/A 04/20/2018  Procedure: LOOP RECORDER INSERTION;  Surgeon: Marinus Maw, MD;  Location: [facility] INVASIVE CV LAB;  Service: Cardiovascular;  Laterality: N/A;  . SHOULDER ARTHROSCOPY Right 2004  . SHOULDER ARTHROSCOPY WITH DEBRIDEMENT AND BICEP TENDON REPAIR Left 04-28-2011  . SPERMATOCELECTOMY Left 10/04/2013   Procedure: LEFT SPERMATOCELECTOMY;  Surgeon: Antony Haste, MD;  Location: [facility];  Service: Urology;  Laterality: Left;  . TEE WITHOUT CARDIOVERSION N/A 02/16/2018   Procedure: TRANSESOPHAGEAL ECHOCARDIOGRAM (TEE);  Surgeon: Chrystie Nose, MD;  Location: [facility]OSCOPY;  Service: Cardiovascular;  Laterality: N/A;  . TRANSURETHRAL RESECTION OF  PROSTATE N/A 12/11/2014   Procedure: CYSTOSCOPY, CLOT EVACUATION, WITH FULGURATION ;  Surgeon: Bjorn Pippin, MD;  Location: [facility];  Service: Urology;  Laterality: N/A;  . TRANSURETHRAL RESECTION OF PROSTATE N/A 09/18/2015   Procedure: TRANSURETHRAL RESECTION OF THE PROSTATE (TURP);  Surgeon: Jerilee Field, MD;  Location: [facility];  Service: Urology;  Laterality: N/A;  . UMBILICAL HERNIA REPAIR  06-02-2008    Current Outpatient Medications  Medication Sig Dispense Refill  . amLODipine (NORVASC) 5 MG tablet Take 1 tablet (5 mg total) by mouth daily. 90 tablet 2  . apixaban (ELIQUIS) 5 MG TABS tablet Take 1 tablet (5 mg total) by mouth 2 (two) times daily. 180 tablet 3  . budesonide-formoterol (SYMBICORT) 160-4.5 MCG/ACT inhaler Inhale 1 puff into the lungs 2 (two) times daily as needed (for shortness of breath).    . Coenzyme Q10 (CO Q 10) 100 MG CAPS Take 100 mg by mouth daily.     . Glucosamine HCl (SM GLUCOSAMINE HCL) 1500 MG TABS Take 1,500 mg by mouth daily.    Marland Kitchen losartan (COZAAR) 100 MG tablet Take 1 tablet (100 mg total) by mouth daily. 90 tablet 1  . rosuvastatin (CRESTOR) 20 MG tablet Take 1 tablet (20 mg total) by mouth every evening. 90 tablet 1  . Turmeric (QC TUMERIC COMPLEX PO) Take by mouth.    . Omega-3 Fatty Acids (FISH OIL) 1000 MG CAPS Take 1,000 mg by mouth daily.      No current facility-administered medications for this visit.     Allergies:   Patient has no known allergies.   Social History:  The patient  reports that he quit smoking about 48 years ago. His smoking use included cigarettes. He has a 1.50 pack-year smoking history. He has never used smokeless tobacco. He reports current alcohol use. He reports that he does not use drugs.   Family History:  The patient's family history is not on file.    ROS:  Please see the history of present illness.  All other systems are reviewed and negative.    PHYSICAL EXAM: VS:  BP 130/74   Pulse (!) 103   Temp (!) 97.2 F  (36.2 C)   Ht 5\' 6"  (1.676 m)   Wt 157 lb (71.2 kg)   SpO2 97%   BMI 25.34 kg/m  , BMI Body mass index is 25.34 kg/m. GEN: Well nourished, well developed, in no acute distress  HEENT: normal  Neck: no JVD, no masses. No carotid bruits Cardiac: RRR  No gallop or murmur.          Respiratory:  clear to auscultation bilaterally, normal work of breathing GI: soft, nontender, nondistended, + BS MS: no deformity or atrophy. There is a large mature bruise in the left shoulder and upper arm. Ext: no pretibial edema, pedal pulses 2+= bilaterally Skin: warm and dry, no rash Neuro:  Strength and sensation are  intact Psych: euthymic mood, full affect  EKG:  EKG is not ordered today.    Recent Labs: 09/17/2018: BUN 14; Creatinine, Ser 1.03; Hemoglobin 12.9; Platelets 211; Potassium 4.4; Sodium 139   Lipid Panel     Component Value Date/Time   CHOL 117 01/15/2018 0453   TRIG 64 01/15/2018 0453   HDL 41 01/15/2018 0453   CHOLHDL 2.9 01/15/2018 0453   VLDL 13 01/15/2018 0453   LDLCALC 63 01/15/2018 0453   Dated 04/09/18: cholesterol 134, triglycerides 87, HDL 45, LDL 72. CBC, chemistries, TSH normal. Dated 04/21/19: cholesterol 118, triglycerides 66, HDL 40, LDL 65. CMET, CBC, TSH normal   Wt Readings from Last 3 Encounters:  04/29/19 157 lb (71.2 kg)  09/17/18 159 lb (72.1 kg)  09/02/18 162 lb 9.6 oz (73.8 kg)     Cardiac Studies Reviewed: TEE 02-16-2018: Study Conclusions  - Left ventricle: The cavity size was normal. There was mild   concentric hypertrophy. Systolic function was normal. The   estimated ejection fraction was in the range of 55% to 60%. Wall   motion was normal; there were no regional wall motion   abnormalities. - Aortic valve: Trileaflet. Mild to moderate eccentric AI, 2 jets,   the larger jet is toward the anterior mitral valve leaflet. - Aorta: Dilated sinus of valsalva to 4.1 cm. - Left atrium: Mildly dilated. No evidence of thrombus in the   atrial cavity  or appendage. - Right atrium: No evidence of thrombus in the atrial cavity or   appendage. - Atrial septum: There is a small PFO by saline microbubble   contrast which demonstrates intermittent right to left flow.  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 This can be a possible mechanism for his   TIA. Consider referral to Dr. Excell Seltzerooper with the structural heart   clinic for closure evaluation.  Assessment/Plan:  1. CAD remote PTCA of the LAD in 1995. No recurrent angina. Continue amlodipine and statin. No antiplatelet therapy due to being on Eliquis 2. Paroxysmal Afib. Noted on loop recorder. Marland Kitchen. He is completely asymptomatic.  On Eliquis.  3. HLD excellent control on Crestor 4. HTN well controlled.   I will follow up in 6 months  Signed, Chaise Passarella [place], MD  04/29/2019 3:32 PM    [facility] [address], [place], [place]  [address] Phone: [phone]; Fax: [phone]

## 2019-04-28 ENCOUNTER — Ambulatory Visit: Payer: Medicare Other | Admitting: Cardiology

## 2019-04-29 ENCOUNTER — Encounter: Payer: Self-pay | Admitting: Cardiology

## 2019-04-29 ENCOUNTER — Other Ambulatory Visit: Payer: Self-pay

## 2019-04-29 ENCOUNTER — Ambulatory Visit (INDEPENDENT_AMBULATORY_CARE_PROVIDER_SITE_OTHER): Payer: Medicare Other | Admitting: Cardiology

## 2019-04-29 VITALS — BP 130/74 | HR 103 | Temp 97.2°F | Ht 66.0 in | Wt 157.0 lb

## 2019-04-29 DIAGNOSIS — I2510 Atherosclerotic heart disease of native coronary artery without angina pectoris: Secondary | ICD-10-CM

## 2019-04-29 DIAGNOSIS — G459 Transient cerebral ischemic attack, unspecified: Secondary | ICD-10-CM

## 2019-04-29 DIAGNOSIS — I480 Paroxysmal atrial fibrillation: Secondary | ICD-10-CM

## 2019-05-23 ENCOUNTER — Other Ambulatory Visit: Payer: Self-pay

## 2019-05-23 MED ORDER — APIXABAN 5 MG PO TABS: 5.0000 mg | ORAL_TABLET | Freq: Two times a day (BID) | ORAL | 1 refills | Status: DC

## 2019-05-24 ENCOUNTER — Other Ambulatory Visit: Payer: Self-pay

## 2019-05-24 MED ORDER — LOSARTAN POTASSIUM 100 MG PO TABS: 100.0000 mg | ORAL_TABLET | Freq: Every day | ORAL | 1 refills | Status: DC

## 2019-05-24 MED ORDER — ROSUVASTATIN CALCIUM 20 MG PO TABS: 20.0000 mg | ORAL_TABLET | Freq: Every evening | ORAL | 1 refills | Status: DC

## 2019-05-24 MED ORDER — AMLODIPINE BESYLATE 5 MG PO TABS: 5.0000 mg | ORAL_TABLET | Freq: Every day | ORAL | 2 refills | Status: DC

## 2019-07-11 ENCOUNTER — Other Ambulatory Visit: Payer: Self-pay | Admitting: Internal Medicine

## 2019-07-11 NOTE — Telephone Encounter (Signed)
Age 78, weight 71kg, SCr 0.9 on 04/21/19, last OV Sept 2020, afib indication

## 2019-08-16 ENCOUNTER — Ambulatory Visit: Payer: Medicare Other | Admitting: *Deleted

## 2019-08-16 ENCOUNTER — Other Ambulatory Visit: Payer: Self-pay

## 2019-08-16 DIAGNOSIS — I639 Cerebral infarction, unspecified: Secondary | ICD-10-CM

## 2019-08-16 DIAGNOSIS — I442 Atrioventricular block, complete: Secondary | ICD-10-CM

## 2019-08-17 LAB — CUP PACEART INCLINIC DEVICE CHECK
Date Time Interrogation Session: 20201229102900
Implantable Pulse Generator Implant Date: 20190903

## 2019-08-17 NOTE — Progress Notes (Signed)
Loop check in clinic. Battery status: OK. R-waves 0.58 mV. 0 symptom episodes, 0 tachy episodes, 0 pause episodes, 0 brady episodes. 1 AF episodes (< 0.1% burden), + Eliquis. Patient does not wish to do monthly summary reports. In office checks every 6 months and next visit 02/14/20.

## 2019-10-10 ENCOUNTER — Other Ambulatory Visit: Payer: Self-pay | Admitting: Cardiology

## 2019-10-27 ENCOUNTER — Ambulatory Visit: Payer: Medicare Other | Admitting: Cardiology

## 2019-11-11 IMAGING — CT CT HEAD W/O CM
4 series · 16 of 47 positions shown, 18 images · non-contrast
Comparison: MRI of the brain performed 10/09/2016

CLINICAL DATA: Acute onset of speech difficulties. Vertigo.

EXAM:
CT HEAD WITHOUT CONTRAST
TECHNIQUE: Contiguous axial images were obtained from the base of the skull
through the vertex without intravenous contrast.

[Series 3: head without · axial · non-contrast · 0.41mm/px · z∈[-38,+82]mm · 7 of 32 slices shown, 9 images]
[im 4/32  brain]
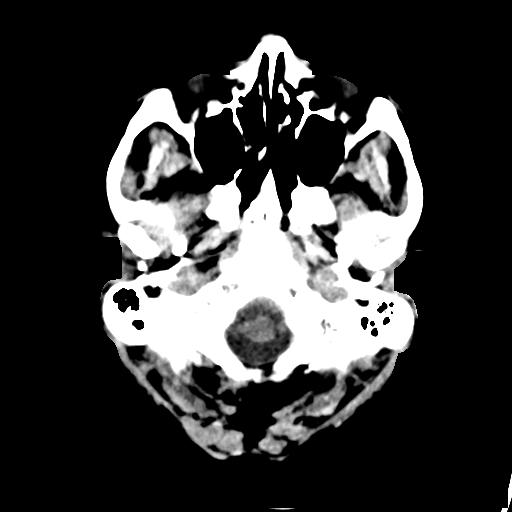
[im 4/32  bone]
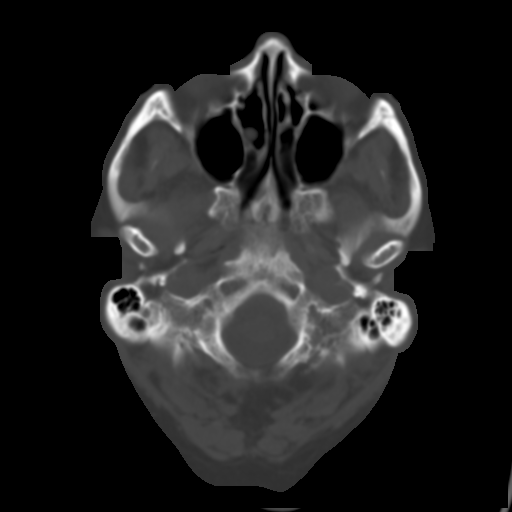
[im 8/32  brain]
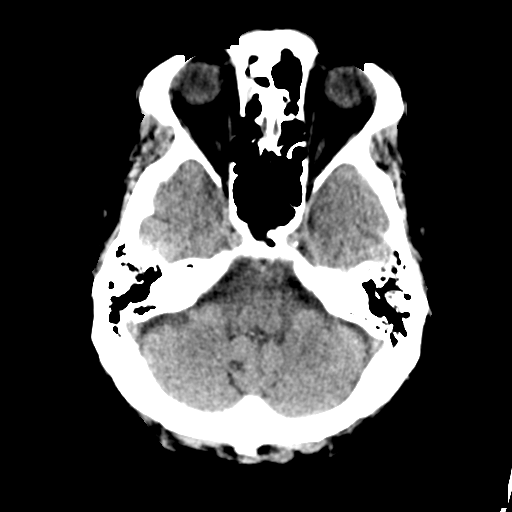
[im 12/32  brain]
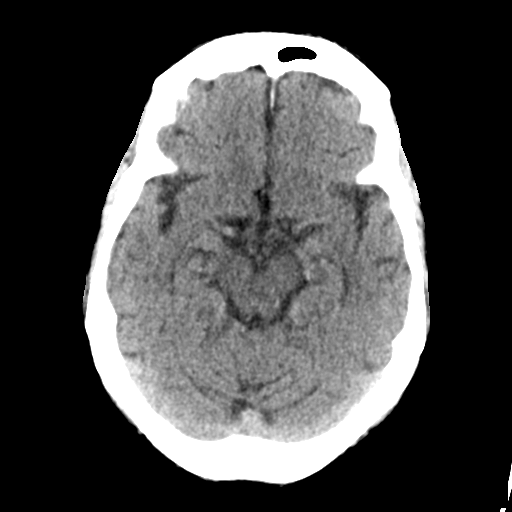
[im 16/32  brain]
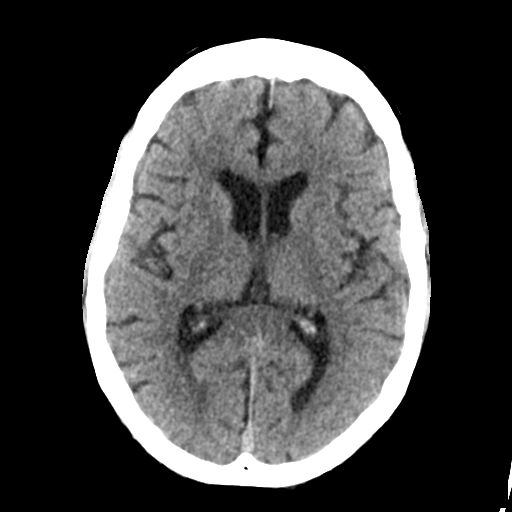
[im 20/32  brain]
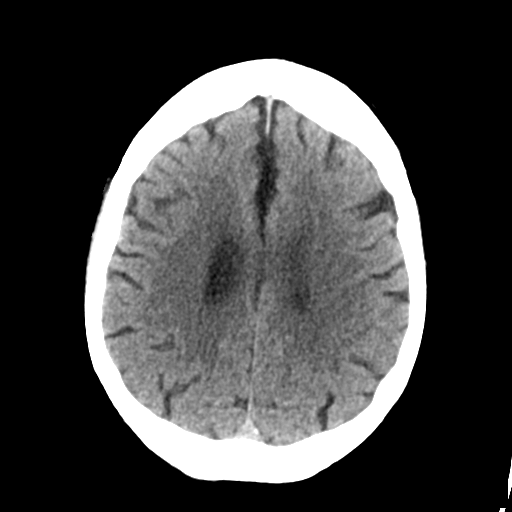
[im 20/32  bone]
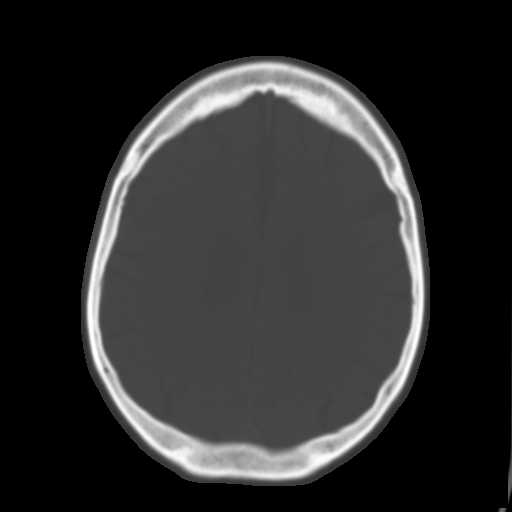
[im 24/32  brain]
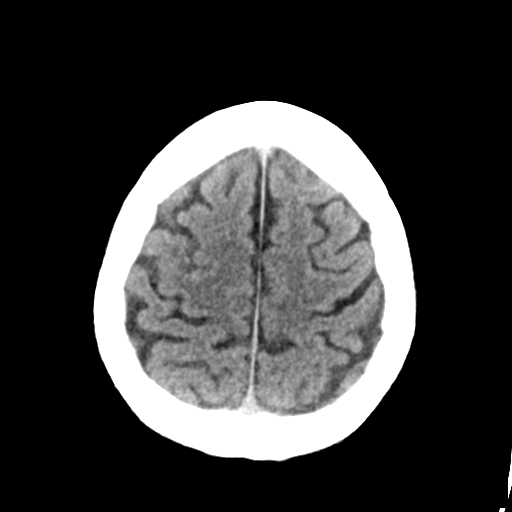
[im 28/32  brain]
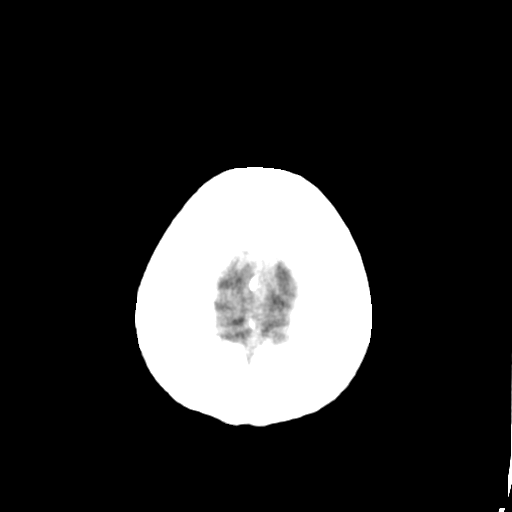

[Series 4: head bone · axial · 0.41mm/px · z∈[-39,-7]mm · 3 of 80 slices shown]
[im 8/80  bone]
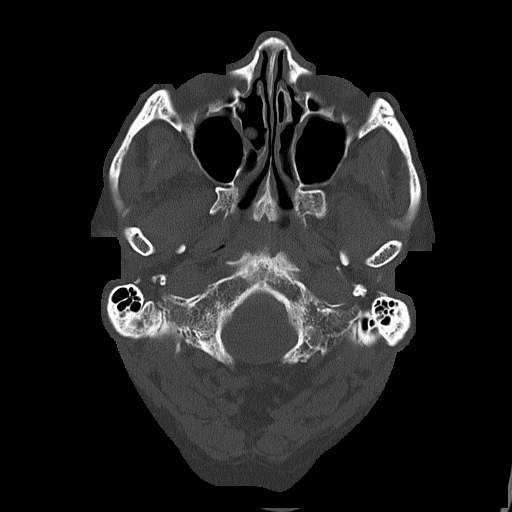
[im 16/80  bone]
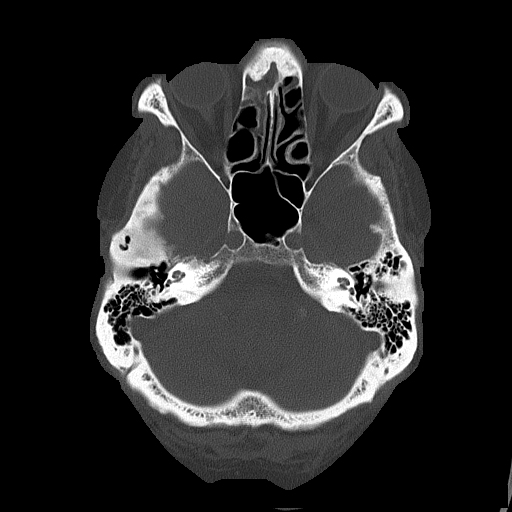
[im 24/80  bone]
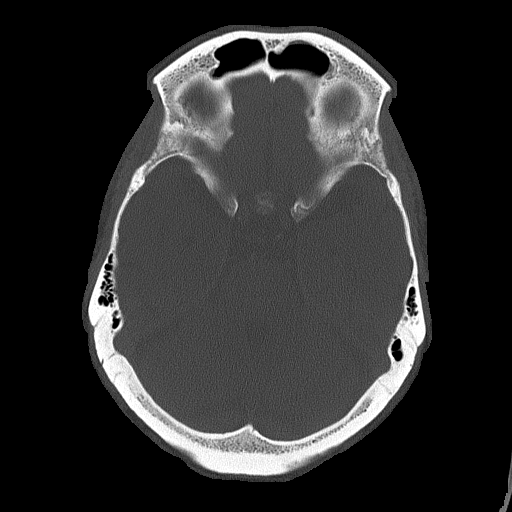

[Series 5: head without cor · coronal · non-contrast · 0.31mm/px · 3 of 67 slices shown]
[im 23/67  brain]
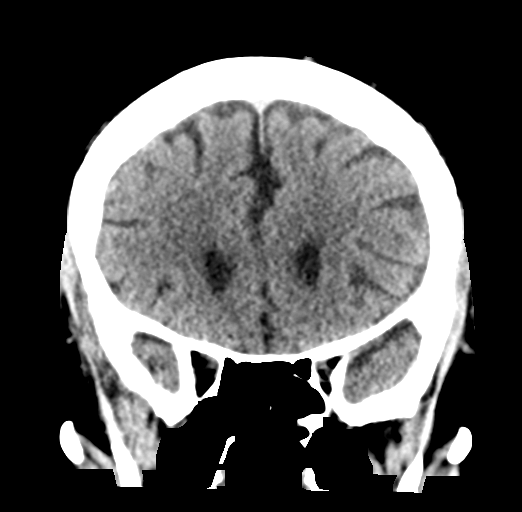
[im 30/67  brain]
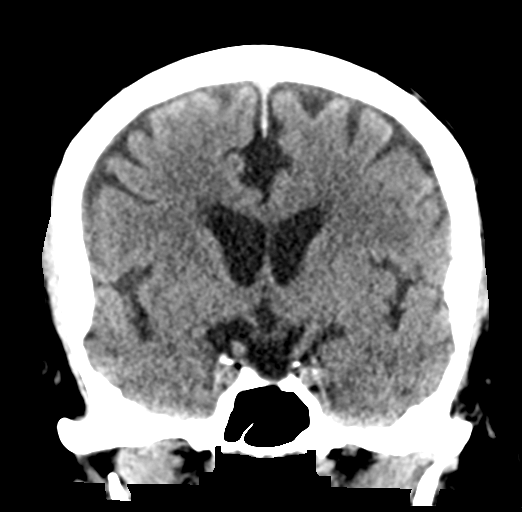
[im 37/67  brain]
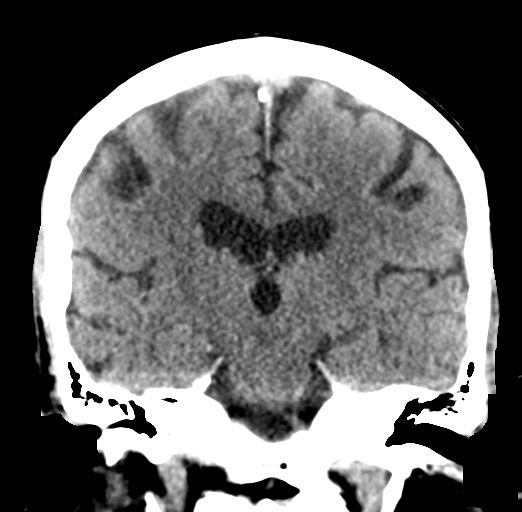

[Series 6: head without sag · sagittal · non-contrast · 0.31mm/px · 3 of 54 slices shown]
[im 18/54  brain]
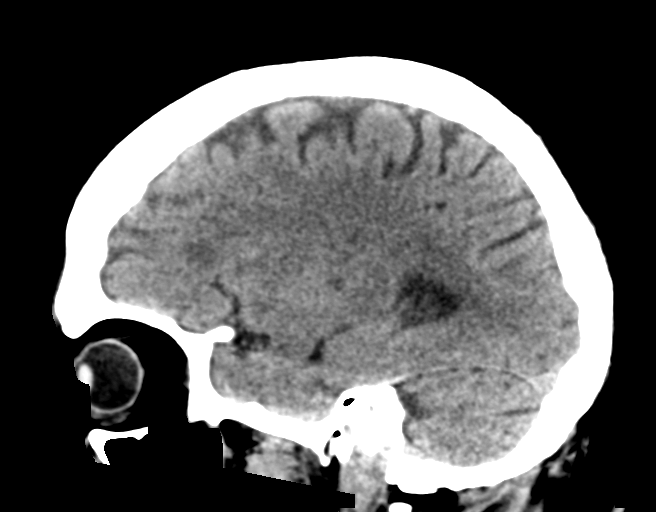
[im 27/54  brain]
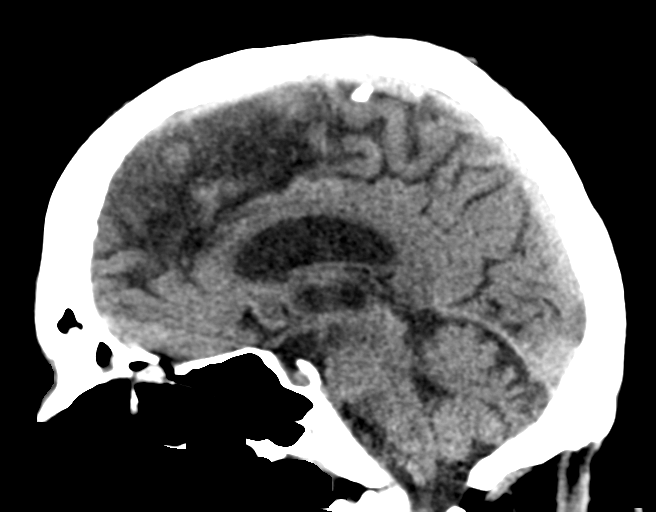
[im 36/54  brain]
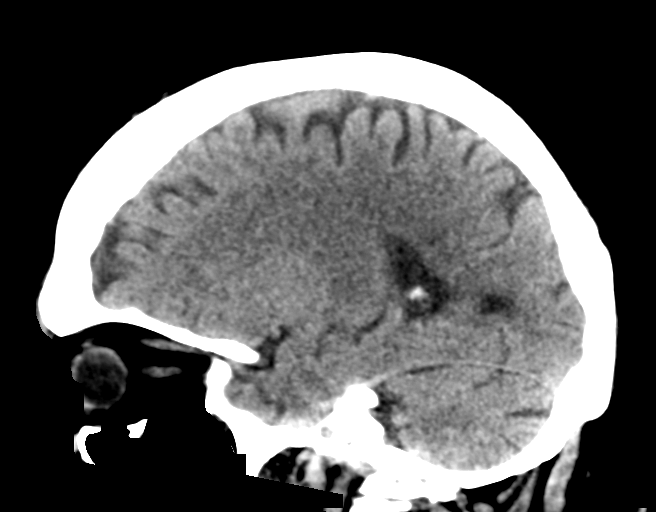

[16 of 47 positions shown; findings below may reference images not displayed]

FINDINGS: Brain: No evidence of acute infarction, hemorrhage, hydrocephalus,
extra-axial collection or mass lesion / mass effect.

Prominence of the ventricles and sulci reflects mild cortical volume
loss. Mild periventricular and subcortical matter change likely
reflects small vessel ischemic microangiopathy.

The brainstem and fourth ventricle are within normal limits. The
basal ganglia are unremarkable in appearance. The cerebral
hemispheres demonstrate grossly normal gray-white differentiation.
No mass effect or midline shift is seen.

Vascular: No hyperdense vessel or unexpected calcification.

Skull: There is no evidence of fracture; visualized osseous
structures are unremarkable in appearance.

Sinuses/Orbits: The orbits are within normal limits. The paranasal
sinuses and mastoid air cells are well-aerated.

Other: No significant soft tissue abnormalities are seen.
IMPRESSION: 1. No acute intracranial pathology seen on CT.
2. Mild cortical volume loss and scattered small vessel ischemic
microangiopathy.

## 2019-11-11 IMAGING — DX DG CHEST 2V
2 series · 2 of 2 positions shown · non-contrast
Comparison: 11/15/2015

CLINICAL DATA: TIA

EXAM:
CHEST - 2 VIEW

[chest pa]
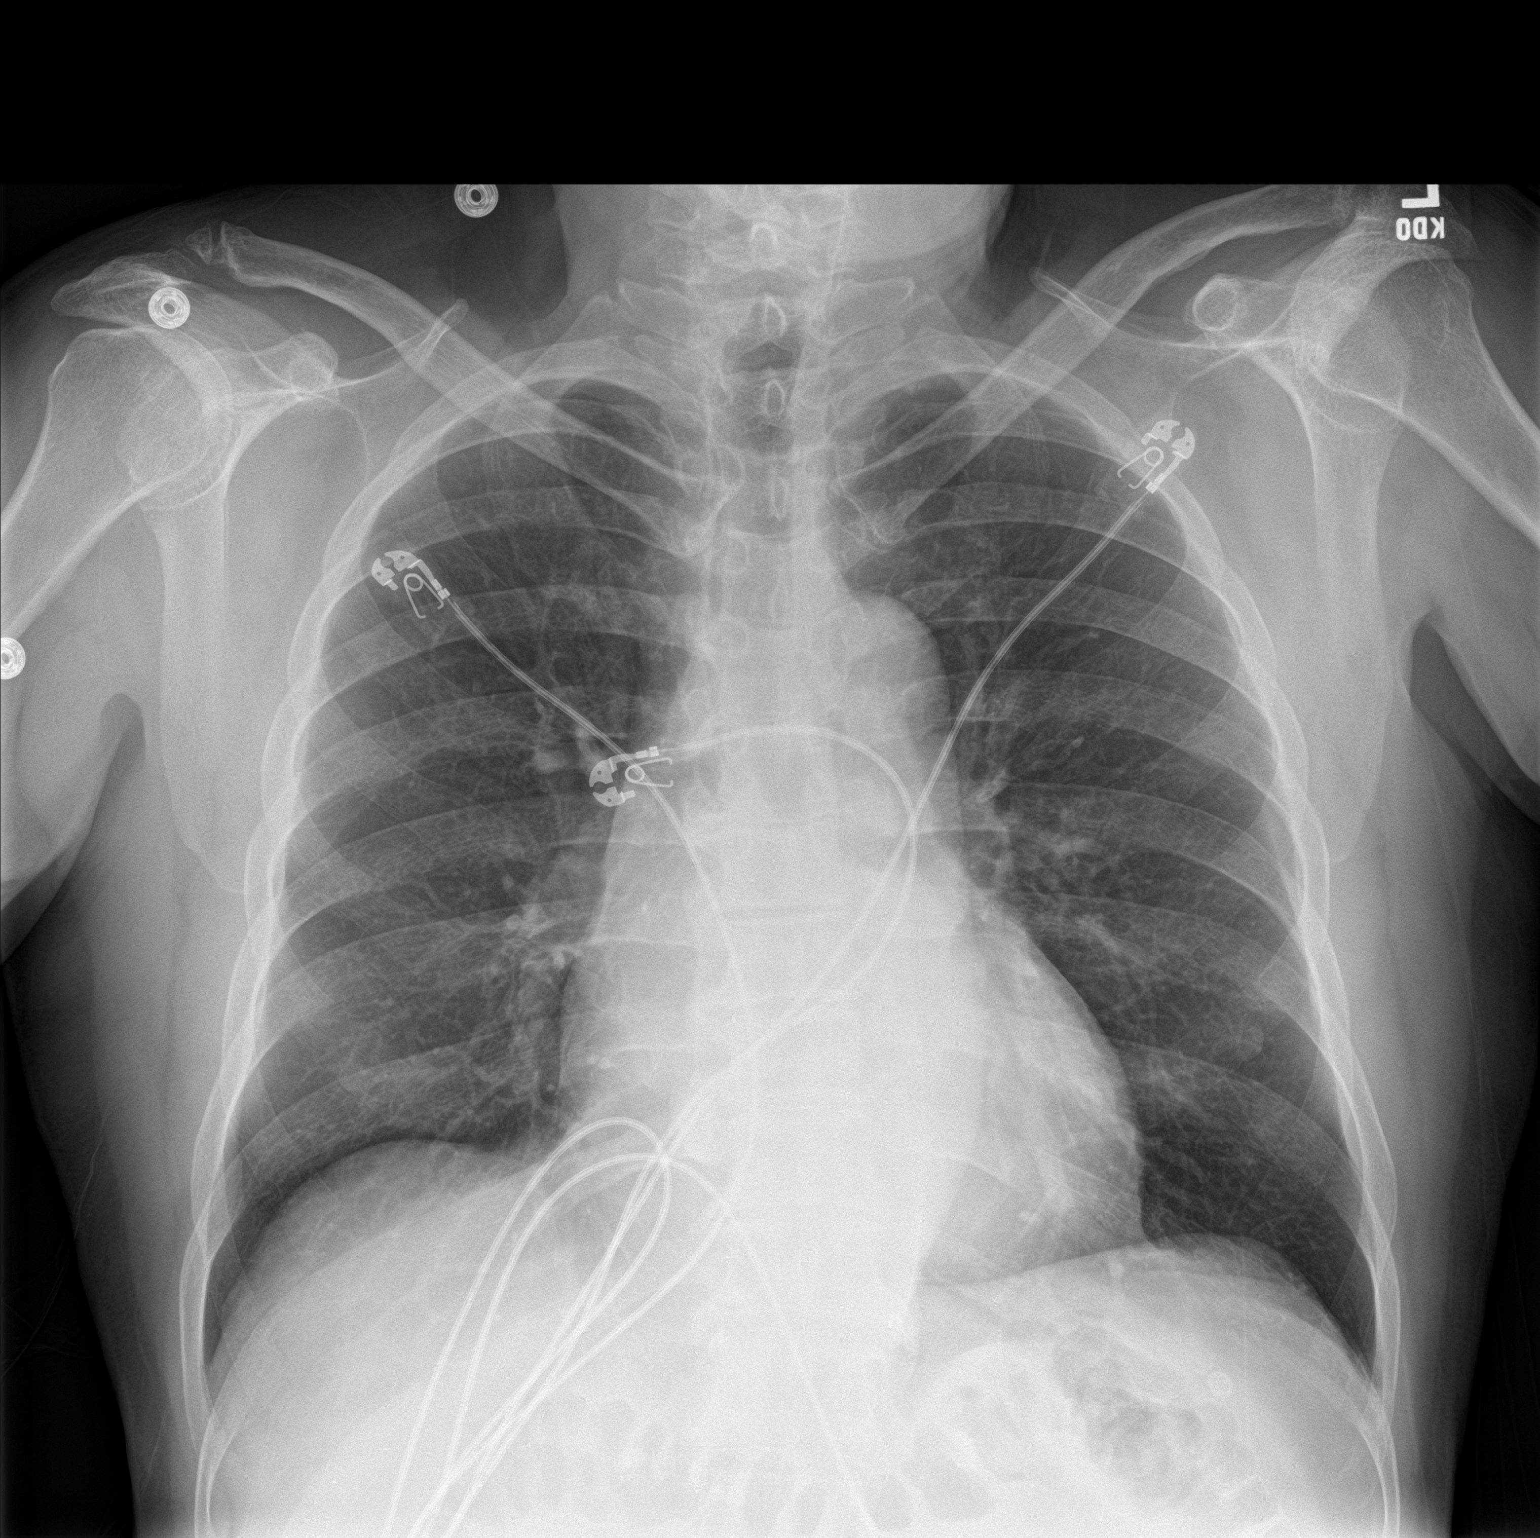

[chest lat]
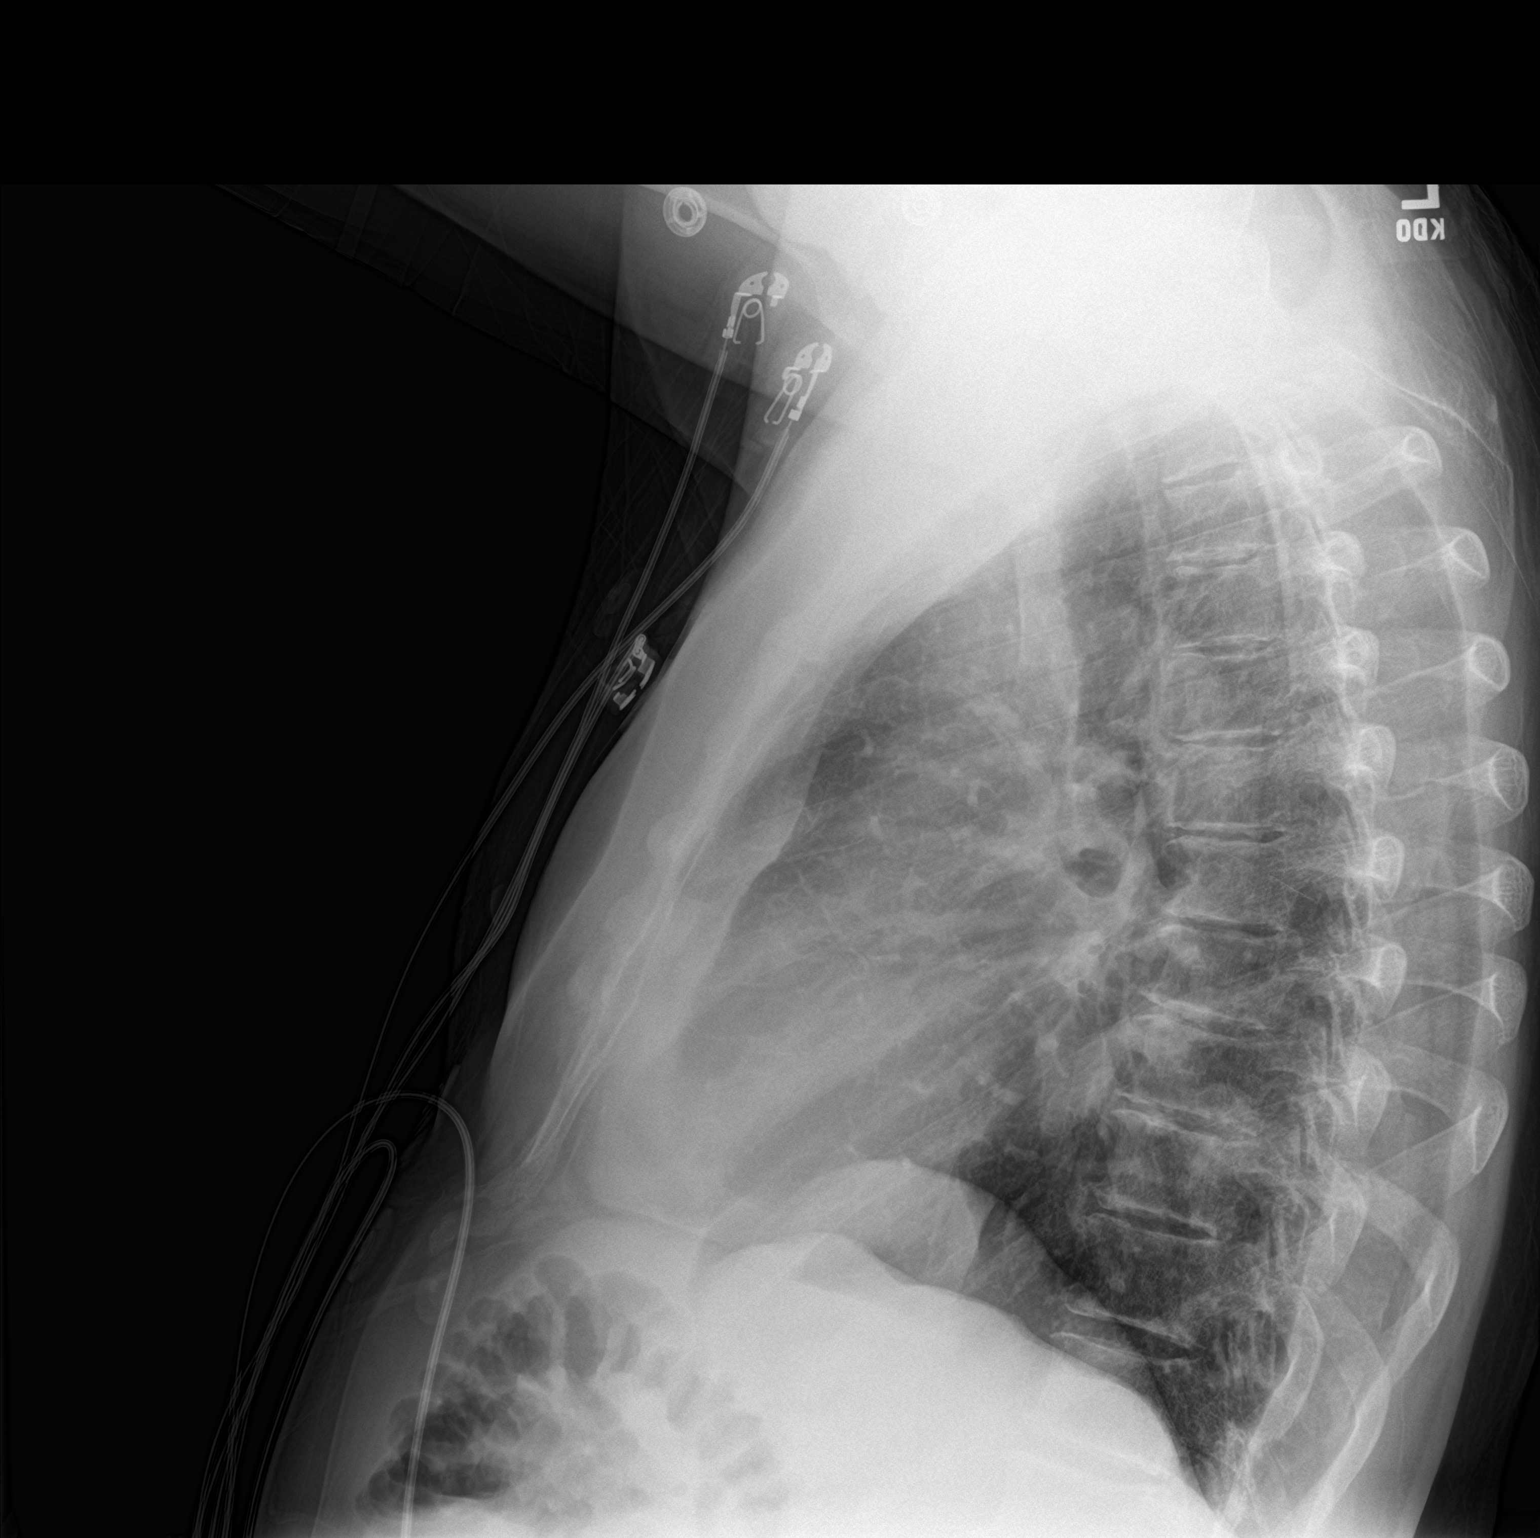

[2 of 2 positions shown; findings below may reference images not displayed]

FINDINGS: Lungs are clear.  No pleural effusion or pneumothorax.

The heart is normal in size.

Mild degenerative changes of the visualized thoracolumbar spine.
IMPRESSION: Normal chest radiographs.

## 2019-11-19 NOTE — Progress Notes (Signed)
Cardiology Office Note Date:  11/22/2019   ID:  Edward [name], DOB 03/01/1941, MRN [id number]  PCP:  [name], MD  Cardiologist:  Tru Rana [place] MD  Chief Complaint  Patient presents with  . Atrial Fibrillation  . Coronary Artery Disease   History of Present Illness: Edward [name] is a 79 y.o. male who is seen for evaluation of CAD.   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 This was his second episode, the previous occurring approximately one year earlier. He had been compliant with aspirin prior to the first event, and has subsequently been compliant with plavix which was started  at the time of his first event.  His evaluation included an echocardiogram, carotid ultrasound, CT and MRI studies of the brain.  All of these were unrevealing.  There was no evidence of cortical infarction on his neuroimaging studies.  He had no arrhythmia documented.  His  TEE demonstrated a small PFO. He was seen by Dr. Excell Seltzer for consideration of PFO closure but after discussion with Neurology this was not felt to be indication. He subsequently had an implantable loop recorder placed by Dr Ladona Ridgel. There is a nursing note from Jan 3 stating he had an episode of AFib and he was started on Eliquis. 2 days after starting Eliquis he developed a hematoma in his left shoulder. Eliquis was reduced for 2 weeks then increase back to 5 mg bid. Plavix was stoppped.   He has a history of CAD with remote angioplasty of the LAD in 1995.  He really has never had recurrent angina. He does have  a history of vertigo. Last loop recorder check on Dec 29 showed only one episode of Afib lasting minutes.  On follow up today he is doing well. Notes some feeling of dizziness that has been present for at least a couple of years with imbalance. He is active around his house. No chest pain, SOB, palpitations.    Past Medical History:  Diagnosis Date  .  Arthritis   . At risk for sleep apnea    STOP-BANG = 4    SENT TO PCP 02-10-215  . BPH (benign prostatic hypertrophy)   . Coronary artery disease CARIOLOGIST-- DR Donnie Aho   ANGIOPLASTY TO LAD  . Elevated PSA   . Frequency of urination   . History of kidney stones   . Hyperlipidemia   . Hypertension   . Nocturia   . Spermatocele    left  . Stroke ([facility])   . Urgency of urination   . Vertigo     Past Surgical History:  Procedure Laterality Date  . CORONARY ANGIOPLASTY  1995  DR [place]   LAD  . CYSTOSCOPY N/A 09/18/2015   Procedure: CYSTOSCOPY;  Surgeon: Jerilee Field, MD;  Location: [facility];  Service: Urology;  Laterality: N/A;  . CYSTOSCOPY WITH RETROGRADE PYELOGRAM, URETEROSCOPY AND STENT PLACEMENT Left 03/01/2013   Procedure: CYSTOSCOPY WITH LEFT RETROGRADE PYELOGRAM, LEFT URETEROSCOPY and stent PLACEMENT;  Surgeon: Antony Haste, MD;  [facility];  Service: Urology;  Laterality: Left;  . FOOT FUSION Left    SECONDARY TO FX'S  . GREEN LIGHT LASER TURP (TRANSURETHRAL RESECTION OF PROSTATE N/A 10/04/2013   Procedure: GREEN LIGHT LASER TURP (TRANSURETHRAL RESECTION OF PROSTATE;  Surgeon: Antony Haste, MD;  Location: [facility];  Service: Urology;  Laterality: N/A;  . INGUINAL HERNIA REPAIR Bilateral   . LOOP RECORDER  INSERTION N/A 04/20/2018   Procedure: LOOP RECORDER INSERTION;  Surgeon: Marinus Maw, MD;  Location: [facility] INVASIVE CV LAB;  Service: Cardiovascular;  Laterality: N/A;  . SHOULDER ARTHROSCOPY Right 2004  . SHOULDER ARTHROSCOPY WITH DEBRIDEMENT AND BICEP TENDON REPAIR Left 04-28-2011  . SPERMATOCELECTOMY Left 10/04/2013   Procedure: LEFT SPERMATOCELECTOMY;  Surgeon: Antony Haste, MD;  Location: [facility];  Service: Urology;  Laterality: Left;  . TEE WITHOUT CARDIOVERSION N/A 02/16/2018   Procedure: TRANSESOPHAGEAL ECHOCARDIOGRAM (TEE);  Surgeon: Chrystie Nose, MD;  Location: [facility]OSCOPY;  Service: Cardiovascular;   Laterality: N/A;  . TRANSURETHRAL RESECTION OF PROSTATE N/A 12/11/2014   Procedure: CYSTOSCOPY, CLOT EVACUATION, WITH FULGURATION ;  Surgeon: Bjorn Pippin, MD;  Location: [facility];  Service: Urology;  Laterality: N/A;  . TRANSURETHRAL RESECTION OF PROSTATE N/A 09/18/2015   Procedure: TRANSURETHRAL RESECTION OF THE PROSTATE (TURP);  Surgeon: Jerilee Field, MD;  Location: [facility];  Service: Urology;  Laterality: N/A;  . UMBILICAL HERNIA REPAIR  06-02-2008    Current Outpatient Medications  Medication Sig Dispense Refill  . amLODipine (NORVASC) 5 MG tablet Take 1 tablet (5 mg total) by mouth daily. 90 tablet 2  . budesonide-formoterol (SYMBICORT) 160-4.5 MCG/ACT inhaler Inhale 1 puff into the lungs 2 (two) times daily as needed (for shortness of breath).    . Coenzyme Q10 (CO Q 10) 100 MG CAPS Take 100 mg by mouth daily.     Marland Kitchen ELIQUIS 5 MG TABS tablet TAKE 1 TABLET BY MOUTH TWO  TIMES DAILY 180 tablet 1  . Glucosamine HCl (SM GLUCOSAMINE HCL) 1500 MG TABS Take 1,500 mg by mouth daily.    Marland Kitchen losartan (COZAAR) 100 MG tablet TAKE 1 TABLET BY MOUTH  DAILY 90 tablet 1  . Omega-3 Fatty Acids (FISH OIL) 1000 MG CAPS Take 1,000 mg by mouth daily.     . rosuvastatin (CRESTOR) 20 MG tablet TAKE 1 TABLET BY MOUTH IN  THE EVENING 90 tablet 1  . Turmeric (QC TUMERIC COMPLEX PO) Take by mouth.     No current facility-administered medications for this visit.    Allergies:   Patient has no known allergies.   Social History:  The patient  reports that he quit smoking about 48 years ago. His smoking use included cigarettes. He has a 1.50 pack-year smoking history. He has never used smokeless tobacco. He reports current alcohol use. He reports that he does not use drugs.   Family History:  The patient's family history is not on file.    ROS:  Please see the history of present illness.  All other systems are reviewed and negative.    PHYSICAL EXAM: VS:  BP 128/80   Pulse 62   Ht 5\' 6"  (1.676 m)   Wt 157 lb  6.4 oz (71.4 kg)   SpO2 99%   BMI 25.41 kg/m  , BMI Body mass index is 25.41 kg/m. GEN: Well nourished, well developed, in no acute distress  HEENT: normal  Neck: no JVD, no masses. No carotid bruits Cardiac: RRR  No gallop. There is a gr 1-2/6 diastolic murmur LSB to apex.        Respiratory:  clear to auscultation bilaterally, normal work of breathing GI: soft, nontender, nondistended, + BS MS: no deformity or atrophy. There is a large mature bruise in the left shoulder and upper arm. Ext: no pretibial edema, pedal pulses 2+= bilaterally Skin: warm and dry, no rash Neuro:  Strength and sensation are intact Psych: euthymic mood,  full affect  EKG:  EKG is ordered today. NSR with normal Ecg. I have personally reviewed and interpreted this study.    Recent Labs: No results found for requested labs within last 8760 hours.   Lipid Panel     Component Value Date/Time   CHOL 117 01/15/2018 0453   TRIG 64 01/15/2018 0453   HDL 41 01/15/2018 0453   CHOLHDL 2.9 01/15/2018 0453   VLDL 13 01/15/2018 0453   LDLCALC 63 01/15/2018 0453   Dated 04/09/18: cholesterol 134, triglycerides 87, HDL 45, LDL 72. CBC, chemistries, TSH normal. Dated 04/21/19: cholesterol 118, triglycerides 66, HDL 40, LDL 65. CMET, CBC, TSH normal   Wt Readings from Last 3 Encounters:  11/22/19 157 lb 6.4 oz (71.4 kg)  04/29/19 157 lb (71.2 kg)  09/17/18 159 lb (72.1 kg)     Cardiac Studies Reviewed: TEE 02-16-2018: Study Conclusions  - Left ventricle: The cavity size was normal. There was mild   concentric hypertrophy. Systolic function was normal. The   estimated ejection fraction was in the range of 55% to 60%. Wall   motion was normal; there were no regional wall motion   abnormalities. - Aortic valve: Trileaflet. Mild to moderate eccentric AI, 2 jets,   the larger jet is toward the anterior mitral valve leaflet. - Aorta: Dilated sinus of valsalva to 4.1 cm. - Left atrium: Mildly dilated. No evidence  of thrombus in the   atrial cavity or appendage. - Right atrium: No evidence of thrombus in the atrial cavity or   appendage. - Atrial septum: There is a small PFO by saline microbubble   contrast which demonstrates intermittent right to left flow.  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 This can be a possible mechanism for his   TIA. Consider referral to Dr. Burt Knack with the structural heart   clinic for closure evaluation.  Assessment/Plan:  1. CAD remote PTCA of the LAD in 1995. No recurrent angina. Continue amlodipine and statin. No antiplatelet therapy due to being on Eliquis 2. Paroxysmal Afib. Noted on loop recorder. Marland Kitchen He is  asymptomatic.  Continue  Eliquis.  3. HLD excellent control on Crestor 4. HTN well controlled.   I will follow up in one year  Signed, Avi Archuleta [place], MD  11/22/2019 8:45 AM    [address] [place], [place], [place]  [address] Phone: [phone]; Fax: [phone]

## 2019-11-22 ENCOUNTER — Encounter: Payer: Self-pay | Admitting: Cardiology

## 2019-11-22 ENCOUNTER — Other Ambulatory Visit: Payer: Self-pay

## 2019-11-22 ENCOUNTER — Ambulatory Visit: Payer: Medicare Other | Admitting: Cardiology

## 2019-11-22 VITALS — BP 128/80 | HR 62 | Ht 66.0 in | Wt 157.4 lb

## 2019-11-22 DIAGNOSIS — I480 Paroxysmal atrial fibrillation: Secondary | ICD-10-CM | POA: Diagnosis not present

## 2019-11-22 DIAGNOSIS — G459 Transient cerebral ischemic attack, unspecified: Secondary | ICD-10-CM | POA: Diagnosis not present

## 2019-11-22 DIAGNOSIS — I2510 Atherosclerotic heart disease of native coronary artery without angina pectoris: Secondary | ICD-10-CM | POA: Diagnosis not present

## 2019-11-22 NOTE — Addendum Note (Signed)
Addended by: Neoma Laming on: 11/22/2019 08:52 AM   Modules accepted: Orders

## 2020-01-15 ENCOUNTER — Other Ambulatory Visit: Payer: Self-pay | Admitting: Cardiology

## 2020-02-14 ENCOUNTER — Other Ambulatory Visit: Payer: Self-pay

## 2020-02-14 ENCOUNTER — Ambulatory Visit: Payer: Medicare Other | Admitting: Emergency Medicine

## 2020-02-14 DIAGNOSIS — Z95818 Presence of other cardiac implants and grafts: Secondary | ICD-10-CM

## 2020-02-14 DIAGNOSIS — I480 Paroxysmal atrial fibrillation: Secondary | ICD-10-CM

## 2020-02-17 LAB — CUP PACEART INCLINIC DEVICE CHECK
Date Time Interrogation Session: 20210629091200
Implantable Pulse Generator Implant Date: 20190903

## 2020-02-17 NOTE — Progress Notes (Signed)
Loop check in clinic. Battery status: good. No episodes, on Eliquis. Patient declines remote monitoring. ROV with Device Clinic in 6 months.

## 2020-04-22 ENCOUNTER — Other Ambulatory Visit: Payer: Self-pay | Admitting: Cardiology

## 2020-07-10 ENCOUNTER — Other Ambulatory Visit: Payer: Self-pay | Admitting: Cardiology

## 2020-07-19 ENCOUNTER — Telehealth: Payer: Self-pay | Admitting: Cardiology
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
## 2020-07-19 NOTE — Telephone Encounter (Signed)
Patient states he thinks he had a TIA on Monday of this week. He states he pulled up the stop light on [address] and he could not remember where he was or where he was going. He says the only things he could think of were "where am I?" and "How did I get here?" and that to his knowledge it maybe lasted 3-4 minutes.   He wants to know if he should be seen sooner than his 1 year annual that is due in April.   Patient also states his sister had a ultrasound done on her carotid and her left and right arteries were blocked. One side was 45% blocked and one side was 25% blocked. He states this is an issue that runs in the family and is wondering if he should have his looked at.   Please call/advise  Thank you!

## 2020-07-20 ENCOUNTER — Ambulatory Visit: Payer: Medicare Other | Admitting: Cardiology

## 2020-07-20 NOTE — Progress Notes (Deleted)
Cardiology Office Note Date:  07/20/2020   ID:  Edward [name], DOB [birth year], MRN [id number]  PCP:  [name], MD  Cardiologist:  Jacob Cicero [place] MD  No chief complaint on file.  History of Present Illness: Edward [name] is a 79 y.o. 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 This was his second episode, the previous occurring approximately one year earlier. He had been compliant with aspirin prior to the first event, and has subsequently been compliant with plavix which was started  at the time of his first event.  His evaluation included an echocardiogram, carotid ultrasound, CT and MRI studies of the brain.  All of these were unrevealing.  There was no evidence of cortical infarction on his neuroimaging studies.  He had no arrhythmia documented.  His  TEE demonstrated a small PFO. He was seen by Dr. Excell Seltzer for consideration of PFO closure but after discussion with Neurology this was not felt to be indication. He subsequently had an implantable loop recorder placed by Dr Ladona Ridgel. There is a nursing note from Jan 3 stating he had an episode of AFib and he was started on Eliquis. 2 days after starting Eliquis he developed a hematoma in his left shoulder. Eliquis was reduced for 2 weeks then increase back to 5 mg bid. Plavix was stoppped.   He has a history of CAD with remote angioplasty of the LAD in 1995.  He really has never had recurrent angina. He does have  a history of vertigo. Last loop recorder check on June 29 showed no arrhythmia.   On follow up today he is doing well. Notes some feeling of dizziness that has been present for at least a couple of years with imbalance. He is active around his house. No chest pain, SOB, palpitations.    Past Medical History:  Diagnosis Date  . Arthritis   . At risk for sleep apnea    STOP-BANG = 4    SENT TO PCP  02-10-215  . BPH (benign prostatic hypertrophy)   . Coronary artery disease CARIOLOGIST-- DR Donnie Aho   ANGIOPLASTY TO LAD  . Elevated PSA   . Frequency of urination   . History of kidney stones   . Hyperlipidemia   . Hypertension   . Nocturia   . Spermatocele    left  . Stroke (HCC)   . Urgency of urination   . Vertigo     Past Surgical History:  Procedure Laterality Date  . CORONARY ANGIOPLASTY  1995  DR [place]   LAD  . CYSTOSCOPY N/A 09/18/2015   Procedure: CYSTOSCOPY;  Surgeon: Jerilee Field, MD;  Location: [facility];  Service: Urology;  Laterality: N/A;  . CYSTOSCOPY WITH RETROGRADE PYELOGRAM, URETEROSCOPY AND STENT PLACEMENT Left 03/01/2013   Procedure: CYSTOSCOPY WITH LEFT RETROGRADE PYELOGRAM, LEFT URETEROSCOPY and stent PLACEMENT;  Surgeon: Antony Haste, MD;  Location: [facility];  Service: Urology;  Laterality: Left;  . FOOT FUSION Left    SECONDARY TO FX'S  . GREEN LIGHT LASER TURP (TRANSURETHRAL RESECTION OF PROSTATE N/A 10/04/2013   Procedure: GREEN LIGHT LASER TURP (TRANSURETHRAL RESECTION OF PROSTATE;  Surgeon: Antony Haste, MD;  [facility];  Service: Urology;  Laterality: N/A;  . INGUINAL HERNIA REPAIR Bilateral   . LOOP RECORDER INSERTION N/A 04/20/2018   Procedure: LOOP RECORDER INSERTION;  Surgeon: Lewayne Bunting  W, MD;  [facility];  Service: Cardiovascular;  Laterality: N/A;  . SHOULDER ARTHROSCOPY Right 2004  . SHOULDER ARTHROSCOPY WITH DEBRIDEMENT AND BICEP TENDON REPAIR Left 04-28-2011  . SPERMATOCELECTOMY Left 10/04/2013   Procedure: LEFT SPERMATOCELECTOMY;  Surgeon: Antony Haste, MD;  [facility];  Service: Urology;  Laterality: Left;  . TEE WITHOUT CARDIOVERSION N/A 02/16/2018   Procedure: TRANSESOPHAGEAL ECHOCARDIOGRAM (TEE);  Surgeon: Chrystie Nose, MD;  Location: [facility];  Service: Cardiovascular;  Laterality: N/A;  . TRANSURETHRAL RESECTION OF PROSTATE N/A 12/11/2014    Procedure: CYSTOSCOPY, CLOT EVACUATION, WITH FULGURATION ;  Surgeon: Bjorn Pippin, MD;  Location: [facility];  Service: Urology;  Laterality: N/A;  . TRANSURETHRAL RESECTION OF PROSTATE N/A 09/18/2015   Procedure: TRANSURETHRAL RESECTION OF THE PROSTATE (TURP);  Surgeon: Jerilee Field, MD;  [facility];  Service: Urology;  Laterality: N/A;  . UMBILICAL HERNIA REPAIR  06-02-2008    Current Outpatient Medications  Medication Sig Dispense Refill  . amLODipine (NORVASC) 5 MG tablet TAKE 1 TABLET BY MOUTH  DAILY 90 tablet 3  . budesonide-formoterol (SYMBICORT) 160-4.5 MCG/ACT inhaler Inhale 1 puff into the lungs 2 (two) times daily as needed (for shortness of breath).    . Coenzyme Q10 (CO Q 10) 100 MG CAPS Take 100 mg by mouth daily.     Marland Kitchen ELIQUIS 5 MG TABS tablet TAKE 1 TABLET BY MOUTH  TWICE DAILY 180 tablet 3  . Glucosamine HCl (SM GLUCOSAMINE HCL) 1500 MG TABS Take 1,500 mg by mouth daily.    Marland Kitchen losartan (COZAAR) 100 MG tablet TAKE 1 TABLET BY MOUTH  DAILY 90 tablet 3  . Omega-3 Fatty Acids (FISH OIL) 1000 MG CAPS Take 1,000 mg by mouth daily.     . rosuvastatin (CRESTOR) 20 MG tablet TAKE 1 TABLET BY MOUTH IN  THE EVENING 90 tablet 3  . Turmeric (QC TUMERIC COMPLEX PO) Take by mouth.     No current facility-administered medications for this visit.    Allergies:   Patient has no known allergies.   Social History:  The patient  reports that he quit smoking about 49 years ago. His smoking use included cigarettes. He has a 1.50 pack-year smoking history. He has never used smokeless tobacco. He reports current alcohol use. He reports that he does not use drugs.   Family History:  The patient's family history is not on file.    ROS:  Please see the history of present illness.  All other systems are reviewed and negative.    PHYSICAL EXAM: VS:  There were no vitals taken for this visit. , BMI There is no height or weight on file to calculate BMI. GEN: Well nourished, well developed, in no  acute distress  HEENT: normal  Neck: no JVD, no masses. No carotid bruits Cardiac: RRR  No gallop. There is a gr 1-2/6 diastolic murmur LSB to apex.        Respiratory:  clear to auscultation bilaterally, normal work of breathing GI: soft, nontender, nondistended, + BS MS: no deformity or atrophy. There is a large mature bruise in the left shoulder and upper arm. Ext: no pretibial edema, pedal pulses 2+= bilaterally Skin: warm and dry, no rash Neuro:  Strength and sensation are intact Psych: euthymic mood, full affect  EKG:  EKG is ordered today. NSR with normal Ecg. I have personally reviewed and interpreted this study.    Recent Labs: No results found for requested labs within last 8760  hours.   Lipid Panel     Component Value Date/Time   CHOL 117 01/15/2018 0453   TRIG 64 01/15/2018 0453   HDL 41 01/15/2018 0453   CHOLHDL 2.9 01/15/2018 0453   VLDL 13 01/15/2018 0453   LDLCALC 63 01/15/2018 0453   Dated 04/09/18: cholesterol 134, triglycerides 87, HDL 45, LDL 72. CBC, chemistries, TSH normal. Dated 04/21/19: cholesterol 118, triglycerides 66, HDL 40, LDL 65. CMET, CBC, TSH normal   Wt Readings from Last 3 Encounters:  11/22/19 157 lb 6.4 oz (71.4 kg)  04/29/19 157 lb (71.2 kg)  09/17/18 159 lb (72.1 kg)     Cardiac Studies Reviewed: TEE 02-16-2018: Study Conclusions  - Left ventricle: The cavity size was normal. There was mild   concentric hypertrophy. Systolic function was normal. The   estimated ejection fraction was in the range of 55% to 60%. Wall   motion was normal; there were no regional wall motion   abnormalities. - Aortic valve: Trileaflet. Mild to moderate eccentric AI, 2 jets,   the larger jet is toward the anterior mitral valve leaflet. - Aorta: Dilated sinus of valsalva to 4.1 cm. - Left atrium: Mildly dilated. No evidence of thrombus in the   atrial cavity or appendage. - Right atrium: No evidence of thrombus in the atrial cavity or   appendage. -  Atrial septum: There is a small PFO by saline microbubble   contrast which demonstrates intermittent right to left flow.  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 This can be a possible mechanism for his   TIA. Consider referral to Dr. Excell Seltzer with the structural heart   clinic for closure evaluation.  Assessment/Plan:  1. CAD remote PTCA of the LAD in 1995. No recurrent angina. Continue amlodipine and statin. No antiplatelet therapy due to being on Eliquis 2. Paroxysmal Afib. Noted on loop recorder. He is  asymptomatic.  Continue  Eliquis.  3. HLD excellent control on Crestor 4. HTN well controlled.   I will follow up in one year  Signed, Sonam Wandel [place], MD  07/20/2020 8:12 AM    [facility] [address] [place], [place], [place]  [address] Phone: [phone]; Fax: [phone]

## 2020-08-16 ENCOUNTER — Other Ambulatory Visit: Payer: Self-pay

## 2020-08-16 ENCOUNTER — Ambulatory Visit (INDEPENDENT_AMBULATORY_CARE_PROVIDER_SITE_OTHER): Payer: Medicare Other | Admitting: Emergency Medicine

## 2020-08-16 DIAGNOSIS — I639 Cerebral infarction, unspecified: Secondary | ICD-10-CM

## 2020-08-16 LAB — CUP PACEART INCLINIC DEVICE CHECK
Date Time Interrogation Session: 20211230093523
Implantable Pulse Generator Implant Date: 20190903

## 2020-08-16 NOTE — Progress Notes (Signed)
Loop check in clinic. Battery status: good. R-waves 0.58 mV. No symptom episodes, No tachy episodes, No pause episodes, No brady episodes. No AF episodes (0% burden). Patient declines remotes. Next in clinic check 02/12/2021.

## 2020-08-18 DIAGNOSIS — U071 COVID-19: Secondary | ICD-10-CM

## 2020-08-18 HISTORY — DX: COVID-19: U07.1

## 2020-08-20 ENCOUNTER — Ambulatory Visit: Payer: Medicare Other | Admitting: Physician Assistant

## 2020-08-20 ENCOUNTER — Encounter: Payer: Self-pay | Admitting: Physician Assistant

## 2020-08-20 ENCOUNTER — Other Ambulatory Visit: Payer: Self-pay

## 2020-08-20 VITALS — BP 146/76 | HR 65 | Ht 65.5 in | Wt 158.0 lb

## 2020-08-20 DIAGNOSIS — G459 Transient cerebral ischemic attack, unspecified: Secondary | ICD-10-CM

## 2020-08-20 DIAGNOSIS — I2510 Atherosclerotic heart disease of native coronary artery without angina pectoris: Secondary | ICD-10-CM | POA: Diagnosis not present

## 2020-08-20 DIAGNOSIS — I10 Essential (primary) hypertension: Secondary | ICD-10-CM

## 2020-08-20 DIAGNOSIS — E785 Hyperlipidemia, unspecified: Secondary | ICD-10-CM

## 2020-08-20 DIAGNOSIS — I480 Paroxysmal atrial fibrillation: Secondary | ICD-10-CM

## 2020-08-20 DIAGNOSIS — R42 Dizziness and giddiness: Secondary | ICD-10-CM

## 2020-08-20 NOTE — Progress Notes (Signed)
Cardiology Office Note:    Date:  08/22/2020   ID:  Edward [name], DOB March 24, 1941, MRN [id number]  PCP:  Edward Greathouse, MD  [facility] Cardiologist:  Edward Swaz[place], MD  [facility] Electrophysiologist:  None   Referring MD: Edward Greathouse, MD   Chief Complaint  Patient presents with  . Follow-up    Seen for Edward [name]    History of Present Illness:    Edward [name] is a 80 y.o. male with a hx of HTN, HLD, CAD, PAF, recurrent TIA.  He has a remote angioplasty of LAD in 1995. He had the first episode of TIA in Feb 2018.  He was on aspirin and Plavix after the first TIA.  Carotid Doppler obtained in February 2018 only showed 1 to 39% bilateral disease.  Echocardiogram obtained around the same time showed EF 60 to 65%, mild AI.  He had a repeat he had a in May 2019.  Echocardiogram obtained on 01/15/2018 showed EF 50 to 55%, mild to moderate AI mild MR.  CT and MRI of the brain were unrevealing.  TEE obtained on 02/16/2018 demonstrated a small PFO, mild to moderate AI.  He was seen by Edward [name] for consideration of PFO closure however after discussing with neurology, this was not felt to be indicated.  He later underwent implantable loop recorder by Dr. Ladona [name] which captured an episode of atrial fibrillation.  He was subsequently started on Eliquis.  Unfortunately 2 days after starting on the Eliquis, he developed hematoma of the left shoulder.  Eliquis was reduced for 2 weeks then increase back up to 5 mg twice daily.  Plavix stopped.  Patient was last seen by Edward [name] in April 2021 at which time he was doing well.  Most recent device check in December 2021 showed 0% A. fib burden, no tachycardia, bradycardia or pauses.  Patient presents today for evaluation of possible TIA.  The episodes actually sounds more like a generalized transient amnesia episode.  Symptom occurred on 07/19/2020 which was about a month ago.  He stopped at a red light at [facility] and suddenly became  unaware of where he was.  He was waiting at the red light, this is the same crossroad that he crossed hundreds of times in the past however on that day, he could not figure out his location despite in a familiar setting.  He denies any chest pain, dizziness, feeling of passing out, shortness of breath at the time.  He does mention he has episodic dizziness that is not exacerbated by body position changes.  This is more chronic for him.  He has been having some balance issue lately.  I did a full neuro exam, I did not see any focal neurological deficit.  And since this episode happened a month ago, I did not order a head CT as this is unlikely to change his overall treatment.  His cousin recently had carotid surgery due to blockages.  I will repeat a carotid Doppler and refer the patient to neurology service.  I discussed the case with Edward [name] who was agreeable.   Past Medical History:  Diagnosis Date  . Arthritis   . At risk for sleep apnea    STOP-BANG = 4    SENT TO PCP 02-10-215  . BPH (benign prostatic hypertrophy)   . Coronary artery disease CARIOLOGIST-- DR Edward [name]   ANGIOPLASTY TO LAD  . Elevated PSA   . Frequency of urination   . History  of kidney stones   . Hyperlipidemia   . Hypertension   . Nocturia   . Spermatocele    left  . Stroke (HCC)   . Urgency of urination   . Vertigo     Past Surgical History:  Procedure Laterality Date  . CORONARY ANGIOPLASTY  1995  DR [name]   LAD  . CYSTOSCOPY N/A 09/18/2015   Procedure: CYSTOSCOPY;  Surgeon: Edward Field, MD;  [facility];  Service: Urology;  Laterality: N/A;  . CYSTOSCOPY WITH RETROGRADE PYELOGRAM, URETEROSCOPY AND STENT PLACEMENT Left 03/01/2013   Procedure: CYSTOSCOPY WITH LEFT RETROGRADE PYELOGRAM, LEFT URETEROSCOPY and stent PLACEMENT;  Surgeon: Edward Haste, MD;  Location: [facility];  Service: Urology;  Laterality: Left;  . FOOT FUSION Left    SECONDARY TO FX'S  . GREEN LIGHT  LASER TURP (TRANSURETHRAL RESECTION OF PROSTATE N/A 10/04/2013   Procedure: GREEN LIGHT LASER TURP (TRANSURETHRAL RESECTION OF PROSTATE;  Surgeon: Edward Haste, MD;  [facility];  Service: Urology;  Laterality: N/A;  . INGUINAL HERNIA REPAIR Bilateral   . LOOP RECORDER INSERTION N/A 04/20/2018   Procedure: LOOP RECORDER INSERTION;  Surgeon: Edward Maw, MD;  [facility];  Service: Cardiovascular;  Laterality: N/A;  . SHOULDER ARTHROSCOPY Right 2004  . SHOULDER ARTHROSCOPY WITH DEBRIDEMENT AND BICEP TENDON REPAIR Left 04-28-2011  . SPERMATOCELECTOMY Left 10/04/2013   Procedure: LEFT SPERMATOCELECTOMY;  Surgeon: Edward Haste, MD;  [facility];  Service: Urology;  Laterality: Left;  . TEE WITHOUT CARDIOVERSION N/A 02/16/2018   Procedure: TRANSESOPHAGEAL ECHOCARDIOGRAM (TEE);  Surgeon: Chrystie Nose, MD;  Location: [facility];  Service: Cardiovascular;  Laterality: N/A;  . TRANSURETHRAL RESECTION OF PROSTATE N/A 12/11/2014   Procedure: CYSTOSCOPY, CLOT EVACUATION, WITH FULGURATION ;  Surgeon: Edward Pippin, MD;  Location: [facility];  Service: Urology;  Laterality: N/A;  . TRANSURETHRAL RESECTION OF PROSTATE N/A 09/18/2015   Procedure: TRANSURETHRAL RESECTION OF THE PROSTATE (TURP);  Surgeon: Edward Field, MD;  [facility];  Service: Urology;  Laterality: N/A;  . UMBILICAL HERNIA REPAIR  06-02-2008    Current Medications: No outpatient medications have been marked as taking for the 08/20/20 encounter (Office Visit) with Edward Course, PA.     Allergies:   Patient has no known allergies.   Social History   Socioeconomic History  . Marital status: Married    Spouse name: Not on file  . Number of children: 0  . Years of education: Not on file  . Highest education level: Not on file  Occupational History  . Occupation: Technical sales engineer    Comment: retired  Tobacco Use  . Smoking status: Former Smoker    Packs/day: 0.50    Years: 3.00     Pack years: 1.50    Types: Cigarettes    Quit date: 12/18/1970    Years since quitting: 49.7  . Smokeless tobacco: Never Used  Vaping Use  . Vaping Use: Never used  Substance and Sexual Activity  . Alcohol use: Yes    Comment: OCCASIONAL  . Drug use: No  . Sexual activity: Not on file  Other Topics Concern  . Not on file  Social History Narrative  . Not on file   Social Determinants of Health   Financial Resource Strain: Not on file  Food Insecurity: Not on file  Transportation Needs: Not on file  Physical Activity: Not on file  Stress: Not on file  Social Connections: Not on file  Family History: The patient's family history is not on file.  ROS:   Please see the history of present illness.     All other systems reviewed and are negative.  EKGs/Labs/Other Studies Reviewed:    The following studies were reviewed today:  TEE 02/16/2018 LV EF: 55% -  60%   -------------------------------------------------------------------  Indications:   TIA 435.9.   -------------------------------------------------------------------  History:  PMH:  Coronary artery disease. Risk factors:  Hypertension. Dyslipidemia.   -------------------------------------------------------------------  Study Conclusions   - Left ventricle: The cavity size was normal. There was mild  concentric hypertrophy. Systolic function was normal. The  estimated ejection fraction was in the range of 55% to 60%. Wall  motion was normal; there were no regional wall motion  abnormalities.  - Aortic valve: Trileaflet. Mild to moderate eccentric AI, 2 jets,  the larger jet is toward the anterior mitral valve leaflet.  - Aorta: Dilated sinus of valsalva to 4.1 cm.  - Left atrium: Mildly dilated. No evidence of thrombus in the  atrial cavity or appendage.  - Right atrium: No evidence of thrombus in the atrial cavity or  appendage.  - Atrial septum: There is a small PFO by saline  microbubble  contrast which demonstrates intermittent right to left flow.   Impressions:   - Small PFO with intermittent right to left flow noted by saline  microbubble contrast. This can be a possible mechanism for his  TIA. Consider referral to Dr. Burt Knack with the structural heart  clinic for closure evaluation.   EKG:  EKG is ordered today.  The ekg ordered today demonstrates NSR without significant ST-T wave changes  Recent Labs: No results found for requested labs within last 8760 hours.  Recent Lipid Panel    Component Value Date/Time   CHOL 117 01/15/2018 0453   TRIG 64 01/15/2018 0453   HDL 41 01/15/2018 0453   CHOLHDL 2.9 01/15/2018 0453   VLDL 13 01/15/2018 0453   LDLCALC 63 01/15/2018 0453     Risk Assessment/Calculations:       Physical Exam:    VS:  BP (!) 146/76   Pulse 65   Ht 5' 5.5" (1.664 m)   Wt 158 lb (71.7 kg)   BMI 25.89 kg/m     Wt Readings from Last 3 Encounters:  08/20/20 158 lb (71.7 kg)  11/22/19 157 lb 6.4 oz (71.4 kg)  04/29/19 157 lb (71.2 kg)     GEN:  Well nourished, well developed in no acute distress HEENT: Normal NECK: No JVD; No carotid bruits LYMPHATICS: No lymphadenopathy CARDIAC: RRR, no murmurs, rubs, gallops RESPIRATORY:  Clear to auscultation without rales, wheezing or rhonchi  ABDOMEN: Soft, non-tender, non-distended MUSCULOSKELETAL:  No edema; No deformity  SKIN: Warm and dry NEUROLOGIC:  Alert and oriented x 3 PSYCHIATRIC:  Normal affect   ASSESSMENT:    1. Dizziness   2. TIA (transient ischemic attack)   3. Coronary artery disease involving native coronary artery of native heart without angina pectoris   4. Primary hypertension   5. Hyperlipidemia LDL goal <70    PLAN:    In order of problems listed above:  1. Dizziness: He has chronic dizziness.  His main concern today was an episode happened a month ago that sounds like an amnesia episode.  Since the episode occurred a month ago, I did not  order a CT of the head.  I discussed the case with Edward, we will refer the patient to neurology service.  I  will also update carotid Doppler  2. History recurrent TIA: Last bout this episode sounds more like a amnesia episode rather than a TIA.  No focal neurological deficit on exam.    3. PAF: Solitary episode.  Patient has a loop recorder.  On Eliquis.  4. CAD: Denies any recent chest pain  5. Hypertension: Continue current therapy  6. Hyperlipidemia: On Crestor.        Medication Adjustments/Labs and Tests Ordered: Current medicines are reviewed at length with the patient today.  Concerns regarding medicines are outlined above.  Orders Placed This Encounter  Procedures  . Ambulatory referral to Neurology  . EKG 12-Lead  . VAS US CAROTID   No orders of the defined types were placed in this encounter.   Patient Instructions  Medication Instructions: The current medical regimen is effective;  continue present plan and medications as directed. Please refer to the Current Medication list given to you today.  *If you need a refill on your cardiac medications before your next appointment, please call your pharmacy*  Lab Work: NONE  Testing/Procedures: Your physician has requested that you have a carotid duplex. This test is an ultrasound of the carotid arteries in your neck. It looks at blood flow through these arteries that supply the brain with blood. Allow one hour for this exam. There are no restrictions or special instructions.  Follow-Up: At [facility], you and your health needs are our priority.  As part of our continuing mission to provide you with exceptional heart care, we have created designated Provider Care Teams.  These Care Teams include your primary Cardiologist (physician) and Advanced Practice Providers (APPs -  Physician Assistants and Nurse Practitioners) who all work together to provide you with the care you need, when you need it.  We recommend signing up  for the patient portal called "MyChart".  Sign up information is provided on this After Visit Summary.  MyChart is used to connect with patients for Virtual Visits (Telemedicine).  Patients are able to view lab/test results, encounter notes, upcoming appointments, etc.  Non-urgent messages can be sent to your provider as well.   To learn more about what you can do with MyChart, go to [email or web address].    Your next appointment:   4-5 month(s)  The format for your next appointment:   In Person  Provider:   Peter Swazil[place], MD   Other Instructions AMB REFERRAL TO NEUROLOGY     Signed, Edward Course, PA  08/22/2020 11:45 PM    [facility]

## 2020-08-20 NOTE — Patient Instructions (Signed)
Medication Instructions: The current medical regimen is effective;  continue present plan and medications as directed. Please refer to the Current Medication list given to you today.  *If you need a refill on your cardiac medications before your next appointment, please call your pharmacy*  Lab Work: NONE  Testing/Procedures: Your physician has requested that you have a carotid duplex. This test is an ultrasound of the carotid arteries in your neck. It looks at blood flow through these arteries that supply the brain with blood. Allow one hour for this exam. There are no restrictions or special instructions.  Follow-Up: At [facility], you and your health needs are our priority.  As part of our continuing mission to provide you with exceptional heart care, we have created designated Provider Care Teams.  These Care Teams include your primary Cardiologist (physician) and Advanced Practice Providers (APPs -  Physician Assistants and Nurse Practitioners) who all work together to provide you with the care you need, when you need it.  We recommend signing up for the patient portal called "MyChart".  Sign up information is provided on this After Visit Summary.  MyChart is used to connect with patients for Virtual Visits (Telemedicine).  Patients are able to view lab/test results, encounter notes, upcoming appointments, etc.  Non-urgent messages can be sent to your provider as well.   To learn more about what you can do with MyChart, go to [email or web address].    Your next appointment:   4-5 month(s)  The format for your next appointment:   In Person  Provider:   Peter [place], MD   Other Instructions AMB REFERRAL TO NEUROLOGY

## 2020-08-21 ENCOUNTER — Encounter: Payer: Self-pay | Admitting: Neurology

## 2020-08-22 ENCOUNTER — Encounter: Payer: Self-pay | Admitting: Physician Assistant

## 2020-08-28 ENCOUNTER — Other Ambulatory Visit: Payer: Self-pay

## 2020-08-28 ENCOUNTER — Ambulatory Visit (HOSPITAL_COMMUNITY)
Admission: RE | Admit: 2020-08-28 | Discharge: 2020-08-28 | Disposition: A | Discharge status: Home / Self Care | Payer: Medicare Other | Source: Ambulatory Visit | Attending: Cardiovascular Disease | Admitting: Cardiovascular Disease

## 2020-08-28 DIAGNOSIS — R42 Dizziness and giddiness: Secondary | ICD-10-CM | POA: Diagnosis present

## 2020-10-11 NOTE — Progress Notes (Addendum)
NEUROLOGY CONSULTATION NOTE  [name] MRN: [id number] DOB: 1941/01/28  Referring provider: Peter Swaziland, MD Primary care provider: [name], MD  Reason for consult:  TIA  Assessment/Plan:   1.  Episodic confusion - nonfocal event, semiology not absolutely convincing for TIA, however cannot rule out TIA either.   2.  History of recurrent TIAs 3.  Paroxysmal atrial fibrillation 4.  PFO 5.  HTN 6.  HLD  1.  Check MRI of brain without contrast 2.  Check routine EEG 3.  Continue current secondary stroke prevention management 4.  If workup is unrevealing, the only possible change in management would be PFO closure - It was previously believed that the PFO was too small to be clinically significant.  I will reach out to Dr. Swaziland for his opinion.  ADDENDUM:  As per Dr. Swaziland, as his symptoms were vague for TIA and again, given the small size of the PFO, would defer PFO closure and continue current management.  I agree with this plan. 5.  Follow up after testing.  Subjective:  Edward [name] is a 80 year old right-handed male with CAD, paroxysmal atrial fibrillation, PFO, chronic vertigo, HTN, HLD and history of TIA who presents for TIA.  History supplemented by referring provider's note.  On 07/19/2020, he was at a familiar stoplight when he suddenly became disoriented and didn't know where he was.  He didn't know where he need to go.  The light turned green and when he started driving forward, he suddenly recognized where he was and where he needed to go.  No headache, dizziness, numbness or weakness .  Device check showed no atrial fibrillation, tachycardia, bradycardia or pauses.  Carotid ultrasound on 08/28/2020 showed no hemodynamically significant ICA stenosis (1-39%).  No recurrent events.   He has had two previous TIAs.  In February 2018, he was diagnosed with TIA presenting with transient speech difficulty.  MRI of brain showed no acute stroke.  He was on ASA which  was changed to Plavix.  In May 2019, he had another diagnosed TIA presenting with confusion, difficulty reading the banner on the bottom of the TV screen, word-finding difficulty and garbled speech.  MRI of brain showed no acute abnormalities.  MRA of head and neck revealed no emergent large vessel occlusion or significant stenosis. 2D echo showed no cardiac source of emboli.  He was continued on Plavix.  He had a TEE in July 2019 which showed a small PFO which was felt closure was not indicated.  He underwent implantable loop recorder which captured episode of a fib and Plavix was switched to Eliquis.    PAST MEDICAL HISTORY: Past Medical History:  Diagnosis Date  . Arthritis   . At risk for sleep apnea    STOP-BANG = 4    SENT TO PCP 02-10-215  . BPH (benign prostatic hypertrophy)   . Coronary artery disease CARIOLOGIST-- DR Donnie Aho   ANGIOPLASTY TO LAD  . Elevated PSA   . Frequency of urination   . History of kidney stones   . Hyperlipidemia   . Hypertension   . Nocturia   . Spermatocele    left  . Stroke (HCC)   . Urgency of urination   . Vertigo     PAST SURGICAL HISTORY: Past Surgical History:  Procedure Laterality Date  . CORONARY ANGIOPLASTY  1995  DR Swaziland   LAD  . CYSTOSCOPY N/A 09/18/2015   Procedure: CYSTOSCOPY;  Surgeon: Jerilee Field, MD;  Location:  [facility];  Service: Urology;  Laterality: N/A;  . CYSTOSCOPY WITH RETROGRADE PYELOGRAM, URETEROSCOPY AND STENT PLACEMENT Left 03/01/2013   Procedure: CYSTOSCOPY WITH LEFT RETROGRADE PYELOGRAM, LEFT URETEROSCOPY and stent PLACEMENT;  Surgeon: Antony Haste, MD;  Location: [facility];  Service: Urology;  Laterality: Left;  . FOOT FUSION Left    SECONDARY TO FX'S  . GREEN LIGHT LASER TURP (TRANSURETHRAL RESECTION OF PROSTATE N/A 10/04/2013   Procedure: GREEN LIGHT LASER TURP (TRANSURETHRAL RESECTION OF PROSTATE;  Surgeon: Antony Haste, MD;  Location: [facility];  Service: Urology;  Laterality:  N/A;  . INGUINAL HERNIA REPAIR Bilateral   . LOOP RECORDER INSERTION N/A 04/20/2018   Procedure: LOOP RECORDER INSERTION;  Surgeon: Marinus Maw, MD;  Location: [facility];  Service: Cardiovascular;  Laterality: N/A;  . SHOULDER ARTHROSCOPY Right 2004  . SHOULDER ARTHROSCOPY WITH DEBRIDEMENT AND BICEP TENDON REPAIR Left 04-28-2011  . SPERMATOCELECTOMY Left 10/04/2013   Procedure: LEFT SPERMATOCELECTOMY;  Surgeon: Antony Haste, MD;  Location: [facility];  Service: Urology;  Laterality: Left;  . TEE WITHOUT CARDIOVERSION N/A 02/16/2018   Procedure: TRANSESOPHAGEAL ECHOCARDIOGRAM (TEE);  Surgeon: Chrystie Nose, MD;  Location: [facility];  Service: Cardiovascular;  Laterality: N/A;  . TRANSURETHRAL RESECTION OF PROSTATE N/A 12/11/2014   Procedure: CYSTOSCOPY, CLOT EVACUATION, WITH FULGURATION ;  Surgeon: Bjorn Pippin, MD;  Location: [facility];  Service: Urology;  Laterality: N/A;  . TRANSURETHRAL RESECTION OF PROSTATE N/A 09/18/2015   Procedure: TRANSURETHRAL RESECTION OF THE PROSTATE (TURP);  Surgeon: Jerilee Field, MD;  Location: [facility];  Service: Urology;  Laterality: N/A;  . UMBILICAL HERNIA REPAIR  06-02-2008    MEDICATIONS: Current Outpatient Medications on File Prior to Visit  Medication Sig Dispense Refill  . amLODipine (NORVASC) 5 MG tablet TAKE 1 TABLET BY MOUTH  DAILY 90 tablet 3  . budesonide-formoterol (SYMBICORT) 160-4.5 MCG/ACT inhaler Inhale 1 puff into the lungs 2 (two) times daily as needed (for shortness of breath).    . Coenzyme Q10 (CO Q 10) 100 MG CAPS Take 100 mg by mouth daily.     Marland Kitchen ELIQUIS 5 MG TABS tablet TAKE 1 TABLET BY MOUTH  TWICE DAILY 180 tablet 3  . Glucosamine HCl 1500 MG TABS Take 1,500 mg by mouth daily.    Marland Kitchen losartan (COZAAR) 100 MG tablet TAKE 1 TABLET BY MOUTH  DAILY 90 tablet 3  . Omega-3 Fatty Acids (FISH OIL) 1000 MG CAPS Take 1,000 mg by mouth daily.     . rosuvastatin (CRESTOR) 20 MG tablet TAKE 1 TABLET BY MOUTH IN  THE EVENING 90  tablet 3  . tamsulosin (FLOMAX) 0.4 MG CAPS capsule Take 0.4 mg by mouth daily.    . Turmeric (QC TUMERIC COMPLEX PO) Take by mouth.     No current facility-administered medications on file prior to visit.    ALLERGIES: No Known Allergies  FAMILY HISTORY: No family history on file.  Objective:  Blood pressure 138/76, pulse 84, height 5\' 6"  (1.676 m), weight 158 lb 3.2 oz (71.8 kg), SpO2 100 %. General: No acute distress.  Patient appears well-groomed.   Head:  Normocephalic/atraumatic Eyes:  fundi examined but not visualized Neck: supple, no paraspinal tenderness, full range of motion Back: No paraspinal tenderness Heart: regular rate and rhythm Lungs: Clear to auscultation bilaterally. Vascular: No carotid bruits. Neurological Exam: Mental status: alert and oriented to person, place, and time, recent and remote memory intact, fund of knowledge intact, attention and concentration intact,  speech fluent and not dysarthric, language intact. Cranial nerves: CN I: not tested CN II: pupils equal, round and reactive to light, visual fields intact CN III, IV, VI:  full range of motion, no nystagmus, no ptosis CN V: facial sensation intact. CN VII: upper and lower face symmetric CN VIII: hearing intact CN IX, X: gag intact, uvula midline CN XI: sternocleidomastoid and trapezius muscles intact CN XII: tongue midline Bulk & Tone: normal, no fasciculations. Motor:  muscle strength 5/5 throughout Sensation:  Pinprick, temperature and vibratory sensation intact. Deep Tendon Reflexes:  2+ throughout,  toes downgoing.   Finger to nose testing:  Without dysmetria.   Heel to shin:  Without dysmetria.   Gait:  Slight limp (chronic).  Romberg negative.    Thank you for allowing me to take part in the care of this patient.  Shon Millet, DO  CC:  Peter Swaziland, MD  [name], MD

## 2020-10-12 ENCOUNTER — Other Ambulatory Visit: Payer: Self-pay

## 2020-10-12 ENCOUNTER — Ambulatory Visit: Payer: Medicare Other | Admitting: Neurology

## 2020-10-12 ENCOUNTER — Encounter: Payer: Self-pay | Admitting: Neurology

## 2020-10-12 VITALS — BP 138/76 | HR 84 | Ht 66.0 in | Wt 158.2 lb

## 2020-10-12 DIAGNOSIS — I480 Paroxysmal atrial fibrillation: Secondary | ICD-10-CM

## 2020-10-12 DIAGNOSIS — I2510 Atherosclerotic heart disease of native coronary artery without angina pectoris: Secondary | ICD-10-CM | POA: Diagnosis not present

## 2020-10-12 DIAGNOSIS — Q211 Atrial septal defect: Secondary | ICD-10-CM | POA: Diagnosis not present

## 2020-10-12 DIAGNOSIS — Q2112 Patent foramen ovale: Secondary | ICD-10-CM

## 2020-10-12 DIAGNOSIS — R410 Disorientation, unspecified: Secondary | ICD-10-CM

## 2020-10-12 DIAGNOSIS — Z8673 Personal history of transient ischemic attack (TIA), and cerebral infarction without residual deficits: Secondary | ICD-10-CM

## 2020-10-12 NOTE — Patient Instructions (Addendum)
1.  Check MRI of brain without contrast. We have sent a referral to [facility] for your MRI and they will call you directly to schedule your appointment. They are located at [address] [facility]. If you need to contact them directly please call [phone].  2.  Check routine EEG 3.  Further recommendations pending results 4.  Follow up after testing.

## 2020-10-15 ENCOUNTER — Other Ambulatory Visit: Payer: Self-pay

## 2020-10-15 ENCOUNTER — Ambulatory Visit: Payer: Medicare Other | Admitting: Neurology

## 2020-10-15 DIAGNOSIS — R410 Disorientation, unspecified: Secondary | ICD-10-CM

## 2020-10-15 NOTE — Procedures (Signed)
ELECTROENCEPHALOGRAM REPORT  Date of Study: 10/15/2020  Patient's Name: Edward [name] MRN: [id number] Date of Birth: 1940-10-12   Clinical History: 80 year old male with brief episode of disorientation while driving.  Medications: NORVASC 5 MG tablet SYMBICORT 160-4.5 MCG/ACT inhaler CO Q 10 100 MG CAPS ELIQUIS 5 MG TABS tablet Glucosamine HCl 1500 MG TABS COZAAR 100 MG tablet FISH OIL 1000 MG CAPS CRESTOR 20 MG tablet FLOMAX 0.4 MG CAPS capsule QC TUMERIC COMPLEX PO  Technical Summary: A multichannel digital EEG recording measured by the international 10-20 system with electrodes applied with paste and impedances below 5000 ohms performed in our laboratory with EKG monitoring in an awake and drowsy patient.  Hyperventilation was not performed as patient is wearing a face mask due to the COVID-19 pandemic.  Photic stimulation was performed.  The digital EEG was referentially recorded, reformatted, and digitally filtered in a variety of bipolar and referential montages for optimal display.    Description: The patient is awake and drowsy during the recording.  During maximal wakefulness, there is a symmetric, medium voltage 8-9 Hz posterior dominant rhythm that attenuates with eye opening.  The record is symmetric.  Stage 2 sleep was not seen.  Photic stimulation did not elicit any abnormalities.  There were no epileptiform discharges or electrographic seizures seen.    EKG lead was unremarkable.  Impression: This awake and drowsy EEG is normal.    Clinical Correlation: A normal EEG does not exclude a clinical diagnosis of epilepsy.  If further clinical questions remain, prolonged EEG may be helpful.  Clinical correlation is advised.   Shon Millet, DO

## 2020-10-28 ENCOUNTER — Other Ambulatory Visit: Payer: Medicare Other

## 2020-10-29 NOTE — Progress Notes (Deleted)
NEUROLOGY FOLLOW UP OFFICE NOTE  Edward [name] [id number]  Assessment/Plan:   1.  Episodic confusion - nonfocal event, semiology not absolutely convincing for TIA, however cannot rule out TIA either.  Due to the vague nonfocal symptoms for TIA, and small size of PFO, would defer PFO closure. 2.  History of recurrent TIAs 3.  Paroxysmal atrial fibrillation 4.  PFO 5.  HTN 6.  HLD  1.  ***  Subjective:  Edward [name] is a 80 year old right-handed male with CAD, paroxysmal atrial fibrillation, PFO, chronic vertigo, HTN, HLD and history of TIA who follows up for confusion.  UPDATE: EEG on 10/15/2020 was normal.  MRI of brain was ordered ***.    HISTORY: On 07/19/2020, he was at a familiar stoplight when he suddenly became disoriented and didn't know where he was.  He didn't know where he need to go.  The light turned green and when he started driving forward, he suddenly recognized where he was and where he needed to go.  No headache, dizziness, numbness or weakness .  Device check showed no atrial fibrillation, tachycardia, bradycardia or pauses.  Carotid ultrasound on 08/28/2020 showed no hemodynamically significant ICA stenosis (1-39%).  No recurrent events.  He has had two previous TIAs.  In February 2018, he was diagnosed with TIA presenting with transient speech difficulty.  MRI of brain showed no acute stroke.  He was on ASA which was changed to Plavix.  In May 2019, he had another diagnosed TIA presenting with confusion, difficulty reading the banner on the bottom of the TV screen, word-finding difficulty and garbled speech.  MRI of brain showed no acute abnormalities.  MRA of head and neck revealed no emergent large vessel occlusion or significant stenosis. 2D echo showed no cardiac source of emboli.  He was continued on Plavix.  He had a TEE in July 2019 which showed a small PFO which was felt closure was not indicated.  He underwent implantable loop recorder which captured  episode of a fib and Plavix was switched to Eliquis.    PAST MEDICAL HISTORY: Past Medical History:  Diagnosis Date  . Arthritis   . At risk for sleep apnea    STOP-BANG = 4    SENT TO PCP 02-10-215  . BPH (benign prostatic hypertrophy)   . Coronary artery disease CARIOLOGIST-- DR Donnie Aho   ANGIOPLASTY TO LAD  . Elevated PSA   . Frequency of urination   . History of kidney stones   . Hyperlipidemia   . Hypertension   . Nocturia   . Spermatocele    left  . Stroke (HCC)   . Urgency of urination   . Vertigo     MEDICATIONS: Current Outpatient Medications on File Prior to Visit  Medication Sig Dispense Refill  . albuterol (VENTOLIN HFA) 108 (90 Base) MCG/ACT inhaler SMARTSIG:1 Puff(s) By Mouth Every 4 Hours PRN    . amLODipine (NORVASC) 5 MG tablet TAKE 1 TABLET BY MOUTH  DAILY 90 tablet 3  . budesonide-formoterol (SYMBICORT) 160-4.5 MCG/ACT inhaler Inhale 1 puff into the lungs 2 (two) times daily as needed (for shortness of breath).    . Coenzyme Q10 (CO Q 10) 100 MG CAPS Take 100 mg by mouth daily.     Marland Kitchen ELIQUIS 5 MG TABS tablet TAKE 1 TABLET BY MOUTH  TWICE DAILY 180 tablet 3  . Glucosamine HCl 1500 MG TABS Take 1,500 mg by mouth daily. (Patient not taking: Reported on 10/12/2020)    .  losartan (COZAAR) 100 MG tablet TAKE 1 TABLET BY MOUTH  DAILY 90 tablet 3  . Omega-3 Fatty Acids (FISH OIL) 1000 MG CAPS Take 1,000 mg by mouth daily.  (Patient not taking: Reported on 10/12/2020)    . tamsulosin (FLOMAX) 0.4 MG CAPS capsule Take 0.4 mg by mouth daily.    . Turmeric (QC TUMERIC COMPLEX PO) Take by mouth. (Patient not taking: Reported on 10/12/2020)     No current facility-administered medications on file prior to visit.    ALLERGIES: No Known Allergies  FAMILY HISTORY: No family history on file.    Objective:  *** General: No acute distress.  Patient appears ***-groomed.   Head:  Normocephalic/atraumatic Eyes:  Fundi examined but not visualized Neck: supple, no  paraspinal tenderness, full range of motion Heart:  Regular rate and rhythm Lungs:  Clear to auscultation bilaterally Back: No paraspinal tenderness Neurological Exam: alert and oriented to person, place, and time. Attention span and concentration intact, recent and remote memory intact, fund of knowledge intact.  Speech fluent and not dysarthric, language intact.  CN II-XII intact. Bulk and tone normal, muscle strength 5/5 throughout.  Sensation to light touch, temperature and vibration intact.  Deep tendon reflexes 2+ throughout, toes downgoing.  Finger to nose and heel to shin testing intact.  Gait normal, Romberg negative.     Shon Millet, DO  CC: [name], MD

## 2020-10-30 ENCOUNTER — Ambulatory Visit: Payer: Medicare Other | Admitting: Neurology

## 2020-10-30 ENCOUNTER — Ambulatory Visit
Admission: RE | Admit: 2020-10-30 | Discharge: 2020-10-30 | Disposition: A | Discharge status: Home / Self Care | Payer: Medicare Other | Source: Ambulatory Visit | Attending: Neurology | Admitting: Neurology

## 2020-10-30 DIAGNOSIS — R410 Disorientation, unspecified: Secondary | ICD-10-CM

## 2020-10-31 NOTE — Progress Notes (Signed)
NEUROLOGY FOLLOW UP OFFICE NOTE  Edward [name] [id number]  Assessment/Plan:   1.  Episodic confusion - nonfocal event, semiology not absolutely convincing for TIA - given the vague nonfocal semiology, would defer PFO closure 2.  Paroxysmal atrial fibrillation 3.  PFO 4.  Hypertension 5.  Hyperlipidemia 6.  History of TIA  1.  Continue secondary stroke prevention as per primary care and cardiology: - Eliquis - Statin - LDL goal less than 70 - Glycemic control.  Hgb A1c goal less than 7 - Blood pressure control 2.  Mediterranean diet 3.  Routine exercise 4.  Follow up as needed.  Subjective:  Edward [name] is a 80 year old right-handed male with CAD, paroxysmal atrial fibrillation, PFO, chronic vertigo, HTN, HLD and history of TIA who follows up for episode of confusion.  UPDATE: EEG on 10/15/2020 was normal. MRI of brain without contrast on 10/30/2020 personally reviewed and showed moderate  Cerebral chronic small vessel ischemic changes but no stroke or other acute intracranial abnormalities.  HISTORY: On 07/19/2020, he was at a familiar stoplight when he suddenly became disoriented and didn't know where he was.  He didn't know where he need to go.  The light turned green and when he started driving forward, he suddenly recognized where he was and where he needed to go.  No headache, dizziness, numbness or weakness .  Device check showed no atrial fibrillation, tachycardia, bradycardia or pauses.  Carotid ultrasound on 08/28/2020 showed no hemodynamically significant ICA stenosis (1-39%).  No recurrent events.  He has had two previous TIAs.  In February 2018, he was diagnosed with TIA presenting with transient speech difficulty.  MRI of brain showed no acute stroke.  He was on ASA which was changed to Plavix.  In May 2019, he had another diagnosed TIA presenting with confusion, difficulty reading the banner on the bottom of the TV screen, word-finding difficulty and garbled  speech.  MRI of brain showed no acute abnormalities.  MRA of head and neck revealed no emergent large vessel occlusion or significant stenosis. 2D echo showed no cardiac source of emboli.  He was continued on Plavix.  He had a TEE in July 2019 which showed a small PFO which was felt closure was not indicated.  He underwent implantable loop recorder which captured episode of a fib and Plavix was switched to Eliquis.    PAST MEDICAL HISTORY: Past Medical History:  Diagnosis Date  . Arthritis   . At risk for sleep apnea    STOP-BANG = 4    SENT TO PCP 02-10-215  . BPH (benign prostatic hypertrophy)   . Coronary artery disease CARIOLOGIST-- DR Edward [name]   ANGIOPLASTY TO LAD  . Elevated PSA   . Frequency of urination   . History of kidney stones   . Hyperlipidemia   . Hypertension   . Nocturia   . Spermatocele    left  . Stroke (HCC)   . Urgency of urination   . Vertigo     MEDICATIONS: Current Outpatient Medications on File Prior to Visit  Medication Sig Dispense Refill  . albuterol (VENTOLIN HFA) 108 (90 Base) MCG/ACT inhaler SMARTSIG:1 Puff(s) By Mouth Every 4 Hours PRN    . amLODipine (NORVASC) 5 MG tablet TAKE 1 TABLET BY MOUTH  DAILY 90 tablet 3  . budesonide-formoterol (SYMBICORT) 160-4.5 MCG/ACT inhaler Inhale 1 puff into the lungs 2 (two) times daily as needed (for shortness of breath).    . Coenzyme Q10 (CO Q  10) 100 MG CAPS Take 100 mg by mouth daily.     Marland Kitchen ELIQUIS 5 MG TABS tablet TAKE 1 TABLET BY MOUTH  TWICE DAILY 180 tablet 3  . Glucosamine HCl 1500 MG TABS Take 1,500 mg by mouth daily. (Patient not taking: Reported on 10/12/2020)    . losartan (COZAAR) 100 MG tablet TAKE 1 TABLET BY MOUTH  DAILY 90 tablet 3  . Omega-3 Fatty Acids (FISH OIL) 1000 MG CAPS Take 1,000 mg by mouth daily.  (Patient not taking: Reported on 10/12/2020)    . tamsulosin (FLOMAX) 0.4 MG CAPS capsule Take 0.4 mg by mouth daily.    . Turmeric (QC TUMERIC COMPLEX PO) Take by mouth. (Patient not taking:  Reported on 10/12/2020)     No current facility-administered medications on file prior to visit.    ALLERGIES: No Known Allergies  FAMILY HISTORY: No family history on file.    Objective:  Blood pressure 137/70, pulse 78, height 5\' 5"  (1.651 m), weight 158 lb (71.7 kg), SpO2 99 %. General: No acute distress.  Patient appears well-groomed.        , DO  CC:  Edward Millet, MD  Edward [name], MD

## 2020-11-01 ENCOUNTER — Other Ambulatory Visit: Payer: Self-pay

## 2020-11-01 ENCOUNTER — Encounter: Payer: Self-pay | Admitting: Neurology

## 2020-11-01 ENCOUNTER — Ambulatory Visit: Payer: Medicare Other | Admitting: Neurology

## 2020-11-01 VITALS — BP 137/70 | HR 78 | Ht 65.0 in | Wt 158.0 lb

## 2020-11-01 DIAGNOSIS — Q211 Atrial septal defect: Secondary | ICD-10-CM | POA: Diagnosis not present

## 2020-11-01 DIAGNOSIS — I480 Paroxysmal atrial fibrillation: Secondary | ICD-10-CM

## 2020-11-01 DIAGNOSIS — I10 Essential (primary) hypertension: Secondary | ICD-10-CM | POA: Diagnosis not present

## 2020-11-01 DIAGNOSIS — E7800 Pure hypercholesterolemia, unspecified: Secondary | ICD-10-CM

## 2020-11-01 DIAGNOSIS — R410 Disorientation, unspecified: Secondary | ICD-10-CM

## 2020-11-01 DIAGNOSIS — Q2112 Patent foramen ovale: Secondary | ICD-10-CM

## 2020-12-20 NOTE — Progress Notes (Signed)
Cardiology Office Note Date:  12/24/2020   ID:  Edward [name], DOB 02-13-1941, MRN [id number]  PCP:  [name], MD  Cardiologist:  Lucky Trotta [place] MD  Chief Complaint  Patient presents with  . Coronary Artery Disease   History of Present Illness: Edward [name] is a 80 y.o. male who is seen for evaluation of CAD.   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 This was his second episode, the previous occurring approximately one year earlier. He had been compliant with aspirin prior to the first event, and has subsequently been compliant with plavix which was started  at the time of his first event.  His evaluation included an echocardiogram, carotid ultrasound, CT and MRI studies of the brain.  All of these were unrevealing.  There was no evidence of cortical infarction on his neuroimaging studies.  He had no arrhythmia documented.  His  TEE demonstrated a small PFO. He was seen by Dr. Excell Seltzer for consideration of PFO closure but after discussion with Neurology this was not felt to be indication. He subsequently had an implantable loop recorder placed by Dr Ladona Ridgel. There is a nursing note from Jan 3 stating he had an episode of AFib and he was started on Eliquis. 2 days after starting Eliquis he developed a hematoma in his left shoulder. Eliquis was reduced for 2 weeks then increase back to 5 mg bid. Plavix was stoppped.   He has a history of CAD with remote angioplasty of the LAD in 1995.  He really has never had recurrent angina. He does have  a history of vertigo. Last loop recorder check on Dec 29 showed only one episode of Afib lasting minutes.  He was seen in January with transient confusion of unclear etiology. Carotid dopplers showed no significant stenosis. Seen by Dr Everlena Cooper. EEG on 10/15/2020 was normal. MRI of brain without contrast on 10/30/2020  showed moderate  Cerebral chronic small vessel ischemic changes but no  stroke or other acute intracranial abnormalities. We discussed his PFO but given uncertainty regarding diagnosis did not feel that closure of PFO was indicated.   On follow up today he is doing well. Notes some feeling of dizziness that has been present for at least a couple of years with imbalance. He is active around his house. No chest pain, SOB, palpitations.    Past Medical History:  Diagnosis Date  . Arthritis   . At risk for sleep apnea    STOP-BANG = 4    SENT TO PCP 02-10-215  . BPH (benign prostatic hypertrophy)   . Coronary artery disease CARIOLOGIST-- DR Donnie Aho   ANGIOPLASTY TO LAD  . Elevated PSA   . Frequency of urination   . History of kidney stones   . Hyperlipidemia   . Hypertension   . Nocturia   . Spermatocele    left  . Stroke (HCC)   . Urgency of urination   . Vertigo     Past Surgical History:  Procedure Laterality Date  . CORONARY ANGIOPLASTY  1995  DR [place]   LAD  . CYSTOSCOPY N/A 09/18/2015   Procedure: CYSTOSCOPY;  Surgeon: Jerilee Field, MD;  Location: [facility];  Service: Urology;  Laterality: N/A;  . CYSTOSCOPY WITH RETROGRADE PYELOGRAM, URETEROSCOPY AND STENT PLACEMENT Left 03/01/2013   Procedure: CYSTOSCOPY WITH LEFT RETROGRADE PYELOGRAM, LEFT URETEROSCOPY and stent PLACEMENT;  Surgeon: Antony Haste, MD;  [facility];  Service: Urology;  Laterality: Left;  . FOOT FUSION Left    SECONDARY TO FX'S  . GREEN LIGHT LASER TURP (TRANSURETHRAL RESECTION OF PROSTATE N/A 10/04/2013   Procedure: GREEN LIGHT LASER TURP (TRANSURETHRAL RESECTION OF PROSTATE;  Surgeon: Antony Haste, MD;  Location[facility];  Service: Urology;  Laterality: N/A;  . INGUINAL HERNIA REPAIR Bilateral   . LOOP RECORDER INSERTION N/A 04/20/2018   Procedure: LOOP RECORDER INSERTION;  Surgeon: Marinus Maw, MD;  [facility];  Service: Cardiovascular;  Laterality: N/A;  . SHOULDER ARTHROSCOPY Right 2004  . SHOULDER  ARTHROSCOPY WITH DEBRIDEMENT AND BICEP TENDON REPAIR Left 04-28-2011  . SPERMATOCELECTOMY Left 10/04/2013   Procedure: LEFT SPERMATOCELECTOMY;  Surgeon: Antony Haste, MD;  [facility];  Service: Urology;  Laterality: Left;  . TEE WITHOUT CARDIOVERSION N/A 02/16/2018   Procedure: TRANSESOPHAGEAL ECHOCARDIOGRAM (TEE);  Surgeon: Chrystie Nose, MD;  Location: [facility];  Service: Cardiovascular;  Laterality: N/A;  . TRANSURETHRAL RESECTION OF PROSTATE N/A 12/11/2014   Procedure: CYSTOSCOPY, CLOT EVACUATION, WITH FULGURATION ;  Surgeon: Bjorn Pippin, MD;  Location: [facility];  Service: Urology;  Laterality: N/A;  . TRANSURETHRAL RESECTION OF PROSTATE N/A 09/18/2015   Procedure: TRANSURETHRAL RESECTION OF THE PROSTATE (TURP);  Surgeon: Jerilee Field, MD;  [facility];  Service: Urology;  Laterality: N/A;  . UMBILICAL HERNIA REPAIR  06-02-2008    Current Outpatient Medications  Medication Sig Dispense Refill  . albuterol (VENTOLIN HFA) 108 (90 Base) MCG/ACT inhaler SMARTSIG:1 Puff(s) By Mouth Every 4 Hours PRN    . amLODipine (NORVASC) 5 MG tablet TAKE 1 TABLET BY MOUTH  DAILY 90 tablet 3  . budesonide-formoterol (SYMBICORT) 160-4.5 MCG/ACT inhaler Inhale 1 puff into the lungs 2 (two) times daily as needed (for shortness of breath).    . Coenzyme Q10 (CO Q 10) 100 MG CAPS Take 100 mg by mouth daily.     Marland Kitchen ELIQUIS 5 MG TABS tablet TAKE 1 TABLET BY MOUTH  TWICE DAILY 180 tablet 3  . Glucosamine-Chondroit-Vit C-Mn (GLUCOSAMINE 1500 COMPLEX PO) Take 1,500 mg by mouth. Daily    . losartan (COZAAR) 100 MG tablet TAKE 1 TABLET BY MOUTH  DAILY 90 tablet 3  . Omega-3 Fatty Acids (FISH OIL) 1000 MG CAPS Take 1,000 mg by mouth daily.    . rosuvastatin (CRESTOR) 20 MG tablet     . tamsulosin (FLOMAX) 0.4 MG CAPS capsule Take 0.4 mg by mouth daily.    . Turmeric (QC TUMERIC COMPLEX PO) Take by mouth.     No current facility-administered medications for this visit.    Allergies:    Amoxicillin-pot clavulanate, Codeine, Levofloxacin, and Zocor [simvastatin]   Social History:  The patient  reports that he quit smoking about 50 years ago. His smoking use included cigarettes. He has a 1.50 pack-year smoking history. He has never used smokeless tobacco. He reports current alcohol use. He reports that he does not use drugs.   Family History:  The patient's family history is not on file.    ROS:  Please see the history of present illness.  All other systems are reviewed and negative.    PHYSICAL EXAM: VS:  BP 130/70   Pulse 67   Ht 5\' 5"  (1.651 m)   Wt 150 lb (68 kg)   SpO2 98%   BMI 24.96 kg/m  , BMI Body mass index is 24.96 kg/m. GEN: Well nourished, well developed, in no acute distress  HEENT: normal  Neck:  no JVD, no masses. No carotid bruits Cardiac: RRR  No gallop. There is a gr 1-2/6 diastolic murmur LSB to apex.        Respiratory:  clear to auscultation bilaterally, normal work of breathing GI: soft, nontender, nondistended, + BS MS: no deformity or atrophy. There is a large mature bruise in the left shoulder and upper arm. Ext: no pretibial edema, pedal pulses 2+= bilaterally Skin: warm and dry, no rash Neuro:  Strength and sensation are intact Psych: euthymic mood, full affect  EKG:  EKG is not ordered today.     Recent Labs: No results found for requested labs within last 8760 hours.   Lipid Panel     Component Value Date/Time   CHOL 117 01/15/2018 0453   TRIG 64 01/15/2018 0453   HDL 41 01/15/2018 0453   CHOLHDL 2.9 01/15/2018 0453   VLDL 13 01/15/2018 0453   LDLCALC 63 01/15/2018 0453   Dated 04/09/18: cholesterol 134, triglycerides 87, HDL 45, LDL 72. CBC, chemistries, TSH normal. Dated 04/21/19: cholesterol 118, triglycerides 66, HDL 40, LDL 65. CMET, CBC, TSH normal Dated 04/25/20: cholesterol 120, triglycerides 46, HDL 44, LDL 67.  Dated 09/24/20: normal CMET  Wt Readings from Last 3 Encounters:  12/24/20 150 lb (68 kg)  11/01/20 158  lb (71.7 kg)  10/12/20 158 lb 3.2 oz (71.8 kg)     Cardiac Studies Reviewed: TEE 02-16-2018: Study Conclusions  - Left ventricle: The cavity size was normal. There was mild   concentric hypertrophy. Systolic function was normal. The   estimated ejection fraction was in the range of 55% to 60%. Wall   motion was normal; there were no regional wall motion   abnormalities. - Aortic valve: Trileaflet. Mild to moderate eccentric AI, 2 jets,   the larger jet is toward the anterior mitral valve leaflet. - Aorta: Dilated sinus of valsalva to 4.1 cm. - Left atrium: Mildly dilated. No evidence of thrombus in the   atrial cavity or appendage. - Right atrium: No evidence of thrombus in the atrial cavity or   appendage. - Atrial septum: There is a small PFO by saline microbubble   contrast which demonstrates intermittent right to left flow.  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 This can be a possible mechanism for his   TIA. Consider referral to Dr. Excell Seltzer with the structural heart   clinic for closure evaluation.  Assessment/Plan:  1. CAD remote PTCA of the LAD in 1995. No recurrent angina. Continue amlodipine and statin. No antiplatelet therapy due to being on Eliquis 2. Paroxysmal Afib. Noted on loop recorder. Marland Kitchen He is  asymptomatic.  Continue  Eliquis.  3. HLD excellent control on Crestor 4. HTN well controlled.  5. Transient confusion. Negative Neurologic evaluation. No indication for PFO closure now.  6. 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  I will follow up in one year   Signed, Ogden Handlin [place], MD  12/24/2020 1:16 PM    [facility] [address] [place], [place], [place]  [address] Phone: [phone]; Fax: [phone]

## 2020-12-24 ENCOUNTER — Other Ambulatory Visit: Payer: Self-pay

## 2020-12-24 ENCOUNTER — Encounter: Payer: Self-pay | Admitting: Cardiology

## 2020-12-24 ENCOUNTER — Ambulatory Visit: Payer: Medicare Other | Admitting: Cardiology

## 2020-12-24 VITALS — BP 130/70 | HR 67 | Ht 65.0 in | Wt 150.0 lb

## 2020-12-24 DIAGNOSIS — E785 Hyperlipidemia, unspecified: Secondary | ICD-10-CM | POA: Diagnosis not present

## 2020-12-24 DIAGNOSIS — I2510 Atherosclerotic heart disease of native coronary artery without angina pectoris: Secondary | ICD-10-CM | POA: Diagnosis not present

## 2020-12-24 DIAGNOSIS — I10 Essential (primary) hypertension: Secondary | ICD-10-CM | POA: Diagnosis not present

## 2020-12-24 DIAGNOSIS — I480 Paroxysmal atrial fibrillation: Secondary | ICD-10-CM

## 2021-02-12 ENCOUNTER — Other Ambulatory Visit: Payer: Self-pay

## 2021-02-12 ENCOUNTER — Ambulatory Visit (INDEPENDENT_AMBULATORY_CARE_PROVIDER_SITE_OTHER): Payer: Medicare Other

## 2021-02-12 DIAGNOSIS — G459 Transient cerebral ischemic attack, unspecified: Secondary | ICD-10-CM

## 2021-02-12 LAB — CUP PACEART INCLINIC DEVICE CHECK
Date Time Interrogation Session: 20220628092836
Implantable Pulse Generator Implant Date: 20190903

## 2021-02-12 NOTE — Progress Notes (Signed)
Loop check in clinic. Battery status: good. R-waves 0.66mV. 0 symptom episodes, 0 tachy episodes, 0 pause episodes, 0 brady episodes. 3 AF episodes (<0.1% burden), appear AF w/ RVR, longest in duration 4 minutes.  + Eliquis. Next in-clinic device check 08/14/21.

## 2021-03-10 ENCOUNTER — Other Ambulatory Visit: Payer: Self-pay | Admitting: Cardiology

## 2021-04-08 ENCOUNTER — Telehealth: Payer: Self-pay | Admitting: Cardiology

## 2021-04-08 MED ORDER — APIXABAN 5 MG PO TABS: 5.0000 mg | ORAL_TABLET | Freq: Two times a day (BID) | ORAL | 3 refills | Status: DC

## 2021-04-08 NOTE — Telephone Encounter (Signed)
New Message:       Patient says he wants to get off of Eliquis and take something else that will not cause any additional danger to him.Edward [name]

## 2021-04-08 NOTE — Telephone Encounter (Signed)
Spoke to patient he stated Eliquis is too expensive.He wanted to know if Dr.Jordan wants him to keep taking.Advised I will leave Eliquis patient assistance form at front desk.Advised to complete and bring back to office with proof of income.

## 2021-04-09 NOTE — Telephone Encounter (Signed)
Called patient. Spoke to wife - she states he just left. Wife thinks patient will be stopping by office later today. Left message for patient to call back.  ( Reviewed  patient  medicare supplement information  Eliquis,Xarelto are both -tier #2  Only tier #1 is warfarin or Jantoven,)

## 2021-04-09 NOTE — Telephone Encounter (Signed)
Pt is returning call.  

## 2021-04-09 NOTE — Telephone Encounter (Signed)
Pt is returning call from yesterday about getting another alternative to Eliquis because by him being retired, he no longer has Major Medical Coverage.   Pt states he does not need the Pharmacy Assistance Envelope that was left at the front desk.

## 2021-04-09 NOTE — Telephone Encounter (Signed)
Spoke to patient. Patient states he does not want to pursue patient assistance. Patient states  their are other people who need assistance more than he does. Patient states in the new year he and his wife will need a Part D medicare coverage.  RN informed patient the process of medication - Eliquis. The option would be tier one medication would be Warfarin- which require 4-6 week monitoring.  (Eliquis and Xarelto ar both brand name medications both are tier  2)  RN instructed patient to ask question of  coverage of brand name medication when looking for new Part D coverage for medication   Patient states he would stay with Eliquis. He wanted to know if Dr [place] had any other options. Patient states he did not need a call back if there was no other options.

## 2021-05-07 ENCOUNTER — Ambulatory Visit: Payer: Medicare Other | Admitting: Neurology

## 2021-05-21 ENCOUNTER — Other Ambulatory Visit: Payer: Self-pay | Admitting: Cardiology

## 2021-05-21 NOTE — Telephone Encounter (Signed)
Called pt's PCP office this afternoon who stated that pt had cbc and cmp done in September and will fax over.

## 2021-05-21 NOTE — Telephone Encounter (Signed)
Prescription refill request for Eliquis received. Indication: afib  Last office visit: 12/24/2020, [place]  Scr: 0.9, 04/21/2019 Age: 80 Weight: 68 kg   Pt is overdue for blood work. Per [facility] it looks like pt had blood work done at  [facility].

## 2021-05-22 ENCOUNTER — Other Ambulatory Visit: Payer: Self-pay

## 2021-05-22 MED ORDER — APIXABAN 5 MG PO TABS: 5.0000 mg | ORAL_TABLET | Freq: Two times a day (BID) | ORAL | 3 refills | Status: DC

## 2021-08-01 ENCOUNTER — Telehealth: Payer: Self-pay | Admitting: Cardiology

## 2021-08-01 NOTE — Telephone Encounter (Signed)
Patient calling in to see if he can get his loop recorder removed before the end of the year. Because his insurance is changing

## 2021-08-01 NOTE — Telephone Encounter (Signed)
Spoke to patient . Patient aware Dr Royann Shivers and nurse are out of the office.   Expect a call tomorrow or Monday. Patient states he has had  the loop recorder in for 3 years "battery has supposedly died and he rather have it out."  Patient states his insurance changes the Vance Year if possible ,he would like to do the procedure before the New Year.

## 2021-08-13 NOTE — Telephone Encounter (Signed)
Outreach made to Pt.  Pt scheduled for loop removal tomorrow at 1:00 pm.  High priority message sent to precert.

## 2021-08-14 ENCOUNTER — Encounter: Payer: Self-pay | Admitting: Internal Medicine

## 2021-08-14 ENCOUNTER — Other Ambulatory Visit: Payer: Self-pay

## 2021-08-14 ENCOUNTER — Ambulatory Visit: Payer: Medicare Other | Admitting: Internal Medicine

## 2021-08-14 DIAGNOSIS — I480 Paroxysmal atrial fibrillation: Secondary | ICD-10-CM | POA: Diagnosis not present

## 2021-08-14 NOTE — Progress Notes (Signed)
HPI [name] returns today to consider additional treatment for his atrial fib. 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 He has done well taking Eliquis. He does not have palpitations and feels well. He denies palpitations. He also has CAD and follows with Dr. Martinique. He is interested in considering Watchman as he would like to get off of Eliquis. He has had documented pauses but no symptoms. His ILR is approaching RRT and he would like to have the ILR removed. Allergies  Allergen Reactions   Amoxicillin-Pot Clavulanate Diarrhea   Codeine Nausea Only   Levofloxacin     Other reaction(s): severe stomach pain   Zocor [Simvastatin]     Other reaction(s): myalgia     Current Outpatient Medications  Medication Sig Dispense Refill   amLODipine (NORVASC) 5 MG tablet TAKE 1 TABLET BY MOUTH  DAILY 90 tablet 3   apixaban (ELIQUIS) 5 MG TABS tablet Take 1 tablet (5 mg total) by mouth 2 (two) times daily. 180 tablet 3   budesonide-formoterol (SYMBICORT) 160-4.5 MCG/ACT inhaler Inhale 1 puff into the lungs 2 (two) times daily as needed (for shortness of breath).     Coenzyme Q10 (CO Q 10) 100 MG CAPS Take 100 mg by mouth daily.      losartan (COZAAR) 100 MG tablet TAKE 1 TABLET BY MOUTH  DAILY 90 tablet 3   rosuvastatin (CRESTOR) 20 MG tablet TAKE 1 TABLET BY MOUTH IN  THE EVENING 90 tablet 3   tamsulosin (FLOMAX) 0.4 MG CAPS capsule Take 0.4 mg by mouth daily.     Turmeric (QC TUMERIC COMPLEX PO) Take by mouth.     albuterol (VENTOLIN HFA) 108 (90 Base) MCG/ACT inhaler SMARTSIG:1 Puff(s) By Mouth Every 4 Hours PRN (Patient not taking: Reported on 08/14/2021)     Glucosamine-Chondroit-Vit C-Mn (GLUCOSAMINE 1500 COMPLEX PO) Take 1,500 mg by mouth. Daily (Patient not taking: Reported on 08/14/2021)     Omega-3 Fatty Acids (FISH OIL) 1000 MG CAPS Take 1,000 mg by mouth daily. (Patient not taking: Reported on 08/14/2021)     No current  facility-administered medications for this visit.     Past Medical History:  Diagnosis Date   Arthritis    At risk for sleep apnea    STOP-BANG = 4    SENT TO PCP 02-10-215   BPH (benign prostatic hypertrophy)    Coronary artery disease CARIOLOGIST-- DR Edward [name]   ANGIOPLASTY TO LAD   Elevated PSA    Frequency of urination    History of kidney stones    Hyperlipidemia    Hypertension    Nocturia    Spermatocele    left   Stroke [facility])    Urgency of urination    Vertigo     ROS:   All systems reviewed and negative except as noted in the HPI.   Past Surgical History:  Procedure Laterality Date   CORONARY ANGIOPLASTY  1995  DR Edward [name]   LAD   CYSTOSCOPY N/A 09/18/2015   Procedure: CYSTOSCOPY;  Surgeon: Festus Aloe, MD;  [facility];  Service: Urology;  Laterality: N/A;   CYSTOSCOPY WITH RETROGRADE PYELOGRAM, URETEROSCOPY AND STENT PLACEMENT Left 03/01/2013   Procedure: CYSTOSCOPY WITH LEFT RETROGRADE PYELOGRAM, LEFT URETEROSCOPY and stent PLACEMENT;  Surgeon: Fredricka Bonine, MD;  Location: [facility];  Service: Urology;  Laterality: Left;   FOOT FUSION Left    SECONDARY TO  FX'S   GREEN LIGHT LASER TURP (TRANSURETHRAL RESECTION OF PROSTATE N/A 10/04/2013   Procedure: GREEN LIGHT LASER TURP (TRANSURETHRAL RESECTION OF PROSTATE;  Surgeon: Antony Haste, MD;  [facility];  Service: Urology;  Laterality: N/A;   INGUINAL HERNIA REPAIR Bilateral    LOOP RECORDER INSERTION N/A 04/20/2018   Procedure: LOOP RECORDER INSERTION;  Surgeon: Marinus Maw, MD;  [facility];  Service: Cardiovascular;  Laterality: N/A;   SHOULDER ARTHROSCOPY Right 2004   SHOULDER ARTHROSCOPY WITH DEBRIDEMENT AND BICEP TENDON REPAIR Left 04-28-2011   SPERMATOCELECTOMY Left 10/04/2013   Procedure: LEFT SPERMATOCELECTOMY;  Surgeon: Antony Haste, MD;  Location: [facility];  Service: Urology;  Laterality: Left;   TEE WITHOUT CARDIOVERSION N/A  02/16/2018   Procedure: TRANSESOPHAGEAL ECHOCARDIOGRAM (TEE);  Surgeon: Chrystie Nose, MD;  Location: [facility];  Service: Cardiovascular;  Laterality: N/A;   TRANSURETHRAL RESECTION OF PROSTATE N/A 12/11/2014   Procedure: CYSTOSCOPY, CLOT EVACUATION, WITH FULGURATION ;  Surgeon: Bjorn Pippin, MD;  Location: [facility];  Service: Urology;  Laterality: N/A;   TRANSURETHRAL RESECTION OF PROSTATE N/A 09/18/2015   Procedure: TRANSURETHRAL RESECTION OF THE PROSTATE (TURP);  Surgeon: Jerilee Field, MD;  Location[facility];  Service: Urology;  Laterality: N/A;   UMBILICAL HERNIA REPAIR  06-02-2008     History reviewed. No pertinent family history.   Social History   Socioeconomic History   Marital status: Married    Spouse name: Not on file   Number of children: 0   Years of education: Not on file   Highest education level: Not on file  Occupational History   Occupation: Technical sales engineer    Comment: retired  Tobacco Use   Smoking status: Former    Packs/day: 0.50    Years: 3.00    Pack years: 1.50    Types: Cigarettes    Quit date: 12/18/1970    Years since quitting: 50.6   Smokeless tobacco: Never  Vaping Use   Vaping Use: Never used  Substance and Sexual Activity   Alcohol use: Yes    Comment: OCCASIONAL   Drug use: No   Sexual activity: Not on file  Other Topics Concern   Not on file  Social History Narrative   Not on file   Social Determinants of Health   Financial Resource Strain: Not on file  Food Insecurity: Not on file  Transportation Needs: Not on file  Physical Activity: Not on file  Stress: Not on file  Social Connections: Not on file  Intimate Partner Violence: Not on file     BP 126/72    Pulse 78    Ht 5\' 5"  (1.651 m)    Wt 154 lb 6.4 oz (70 kg)    SpO2 96%    BMI 25.69 kg/m   Physical Exam:  Well appearing NAD HEENT: Unremarkable Neck:  No JVD, no thyromegally Lymphatics:  No adenopathy Back:  No CVA tenderness Lungs:  Clear with no  wheezes.  HEART:  Regular rate rhythm, no murmurs, no rubs, no clicks Abd:  soft, positive bowel sounds, no organomegally, no rebound, no guarding Ext:  2 plus pulses, no edema, no cyanosis, no clubbing Skin:  No rashes no nodules Neuro:  CN II through XII intact, motor grossly intact  DEVICE  Normal device function.  See PaceArt for details.   Assess/Plan:  PAF - he is asymptomatic and tolerating his medical therapy. ILR - he is approaching RRT and would like to have  his ILR removed.  Coags - he has had no bleeding but would like to consider Watchman. I will ask him to see Dr. Quentin Ore.  CAD - he denies anginal symptoms.   EP Procedure Note  Procedure Performed: [id number] removal  Preoperative indication: cryptogenic stroke s/p ILR with atrial fib discovered  Postoperative diagnosis: same as preoperative  Description of the procedure: after informed consent was obtained, the patient was prepped and draped in a sterile fashion. 5 cc of lidocaine was infiltrated. A one cm stab incision was carried out. A combination of blunt and sharp dissection was utilized. The ILR was removed in total. Benzoin and steristrips were painted on the skin. A bandage was applied and the patient recovered in the usual manner.   Complications: none immediately  Conclusion: successful ILR removal.   Salome Spotted

## 2021-08-14 NOTE — Patient Instructions (Addendum)
Medication Instructions:  Your physician recommends that you continue on your current medications as directed. Please refer to the Current Medication list given to you today.  Labwork: None ordered.  Testing/Procedures: None ordered.  Follow-Up:  Your physician wants you to follow-up in: as needed  Implantable Loop Recorder Removal, Care After This sheet gives you information about how to care for yourself after your procedure. Your health care provider may also give you more specific instructions. If you have problems or questions, contact your health care provider. What can I expect after the procedure? After the procedure, it is common to have: Soreness or discomfort near the incision. Some swelling or bruising near the incision.  Follow these instructions at home: Incision care   Leave your outer dressing on for 72 hours.  After 72 hours you can remove your outer dressing and shower. Leave adhesive strips in place. These skin closures may need to stay in place for 1-2 weeks. If adhesive strip edges start to loosen and curl up, you may trim the loose edges.  You may remove the strips if they have not fallen off after 2 weeks. Check your incision area every day for signs of infection. Check for: Redness, swelling, or pain. Fluid or blood. Warmth. Pus or a bad smell. Do not take baths, swim, or use a hot tub until your incision is completely healed. If your wound site starts to bleed apply pressure.      If you have any questions/concerns please call the device clinic at [phone].  Activity  Return to your normal activities.  Contact a health care provider if: You have redness, swelling, or pain around your incision. You have a fever.Marland Kitchen

## 2021-09-16 ENCOUNTER — Ambulatory Visit: Payer: Self-pay | Admitting: Surgery

## 2021-09-16 DIAGNOSIS — M6208 Separation of muscle (nontraumatic), other site: Secondary | ICD-10-CM | POA: Insufficient documentation

## 2021-09-24 ENCOUNTER — Telehealth: Payer: Self-pay

## 2021-09-24 NOTE — Telephone Encounter (Signed)
° °  Name: Edward [name]  DOB: 1941/01/18  MRN: [id number]   Primary Cardiologist: Peter Swaziland, MD  Chart reviewed as part of pre-operative protocol coverage. Patient was contacted 09/24/2021 in reference to pre-operative risk assessment for pending surgery as outlined below.  WARDELL POKORSKI was last seen 07/2021 by Dr. Ladona Ridgel. Primarily followed by cardiology for hx of CAD with remote angioplasty of LAD 1995, TIAs, PFO without indication for closure, and paroxysmal atrial fibrillation. Last EF assessment was by TEE 02/2018 with EF 55-60%, mild-moderate AI, small PFO. I reached out to patient for update on how he is doing. The patient affirms he has been doing well without any new cardiac symptoms. He is able to achieve 7.59 METS without chest pain or dyspnea. Therefore anticipate patient will be cleared but do see prior remote history of mild-moderate aortic regurgitation 3.5 years ago by echo - will reach out to Dr. Swaziland to get his thoughts on whether patient would benefit from repeat prior to clearance for surgery, or OK to clear based on asymptomatic nature. Dr. Swaziland - Please route response to P CV DIV PREOP (the pre-op pool). Thank you.   Of note, regarding Watchman, pt states Dr. Ladona Ridgel told him he may not be a good candidate for this so after we finalize clearance, will plan to route back to Dr. Ladona Ridgel and his nurse to review/clarify since last OV mentioned referral to Dr. Lalla Brothers.    Laurann Montana, PA-C 09/24/2021, 3:52 PM

## 2021-09-24 NOTE — Telephone Encounter (Signed)
Ok for pt to hold Eliquis for 2 days prior to procedure per MD. He should resume as soon as safely possible after procedure.

## 2021-09-24 NOTE — Telephone Encounter (Signed)
Covering preop today. Will route to pharm then pt will need call. Last OV 07/2021 indicated plan to refer to Dr. Lalla Brothers for [facility] but do not see this occurred. Will need to clarify plans when discussing with pt.

## 2021-09-24 NOTE — Telephone Encounter (Signed)
° °  Pre-operative Risk Assessment    Patient Name: Edward [name]  DOB: Jan 20, 1941 MRN: [id number]      Request for Surgical Clearance    Procedure:   Lap Umbilical Hernia with mesh   Date of Surgery:  Clearance TBD                                 Surgeon:  [facility]  Surgeon's Group or Practice Name:  [facility]  Phone number:  [phone] Fax number:  [phone] ATTN Bryson Corona, CMA   Type of Clearance Requested:   - Medical  - Pharmacy:  Hold Apixaban (Eliquis)     Type of Anesthesia:  General    Additional requests/questions:      Signed, Yacob Wilkerson B Braleigh Massoud   09/24/2021, 7:23 AM

## 2021-09-24 NOTE — Telephone Encounter (Signed)
Patient with diagnosis of afib on Eliquis for anticoagulation.    Procedure: Lap Umbilical Hernia with mesh  Date of procedure: TBD  CHA2DS2-VASc Score = 6  This indicates a 9.7% annual risk of stroke. The patient's score is based upon: CHF History: 0 HTN History: 1 Diabetes History: 0 Stroke History: 2 Vascular Disease History: 1 Age Score: 2 Gender Score: 0   CrCl 30mL/min Platelet count 182K  Likely will require 2 day Eliquis hold prior to procedure. Given hx of afib with recurrent TIAs, will forward to MD for input.

## 2021-09-24 NOTE — Telephone Encounter (Signed)
Noted. Awaiting input from MD routed below regarding any further cardiac testing prior to surgery.

## 2021-09-30 NOTE — Telephone Encounter (Addendum)
Will re-route to MD box for review as requested below. (See 2/7 note re: updated echo.)

## 2021-09-30 NOTE — Telephone Encounter (Signed)
° °  Patient Name: Edward [name]  DOB: [birth year] MRN: [id number]  Primary Cardiologist: Peter Swaziland, MD  Chart reviewed as part of pre-operative protocol coverage.   Based on ACC/AHA guidelines, Edward [name] would be at acceptable risk for the planned procedure without further cardiovascular testing.   Per office recommendation, Ok for pt to hold Eliquis for 2 days prior to procedure per MD. He should resume as soon as safely possible after procedure. Left detailed VM on patient's VM per DPR to give him update of clearance.  Will route this bundled recommendation to requesting provider via Epic fax function. Please call with questions.  Laurann Montana, PA-C 09/30/2021, 8:48 AM

## 2021-09-30 NOTE — Telephone Encounter (Addendum)
Jenny/Dr. Lovena Le - we have cleared this patient for his upcoming hernia surgery, no need for preop input.  However, need input re: referral to [facility]. When reviewing his last visit, Dr. Tanna Furry office note 07/2021 listed a plan to refer to Dr. Quentin Ore to consider Watchman but I did not see this occurred. The patient states Dr. Lovena Le told him he may not be a good candidate for this but will defer to your team to relay to patient whether referral still needed. No reply to preop needed - we are otherwise done with the clearance. Thanks.

## 2021-10-02 ENCOUNTER — Telehealth: Payer: Self-pay

## 2021-10-02 DIAGNOSIS — I480 Paroxysmal atrial fibrillation: Secondary | ICD-10-CM

## 2021-10-02 NOTE — Telephone Encounter (Signed)
Referral placed to Dr. Lalla Brothers to consider Watchman implant per Pt request.

## 2021-10-02 NOTE — Telephone Encounter (Signed)
Per Dr. Ladona Ridgel, scheduled the patient for [facility] consult with Dr. Lalla Brothers 12/06/2021. The patient has a hiatal hernia and has an appointment with Dr. Rayburn Ma in March. He will complete that work up prior to [employer] consult and call back if appointment needs to be rescheduled. He was grateful for call and agrees with plan.

## 2021-10-03 ENCOUNTER — Telehealth: Payer: Self-pay

## 2021-10-25 ENCOUNTER — Other Ambulatory Visit: Payer: Self-pay | Admitting: Surgery

## 2021-11-08 ENCOUNTER — Other Ambulatory Visit: Payer: Self-pay

## 2021-11-08 ENCOUNTER — Encounter (HOSPITAL_COMMUNITY): Payer: Self-pay | Admitting: Surgery

## 2021-11-08 NOTE — Progress Notes (Addendum)
Spoke with pt for pre-op call. Pt has hx of CAD with angioplasty and has A-fib. Pt is on Eliquis, states he was instructed to hold it 2 days prior to surgery. His last dose will be Sunday PM dose. Cardiac clearance is in Epic dated 09/30/21. Pt's cardiologist is Dr. Ladona Ridgel. Pt has a loop recorder.Pt states he is not diabetic.  ? ?Chart sent to Anesthesia PA. ? ? ?

## 2021-11-11 NOTE — H&P (Signed)
?PROVIDER: Wayne Both, MD ? ?MRN: [id number] ?DOB: Mar 15, 1941 ?DATE OF ENCOUNTER: 10/25/2021 ?Subjective  ? ?Chief Complaint: Hernia ? ? ?History of Present Illness: ?Edward [name] is a 81 y.o. male who is seen for for an umbilical hernia. He saw Dr. Michaell Cowing back in January and has since received cardiac clearance for surgery. He, however, has requested that I do the procedure as he has multiple acquaintances that I have operated on in the past. He is without complaints since his last visit. ? ? ? ?Review of Systems: ?A complete review of systems was obtained from the patient. I have reviewed this information and discussed as appropriate with the patient. See HPI as well for other ROS. ? ?ROS  ? ?Medical History: ?Past Medical History:  ?Diagnosis Date  ? Arrhythmia  ? Arthritis  ? DVT (deep venous thrombosis) (CMS-HCC)  ? History of stroke  ? Hyperlipidemia  ? ?Patient Active Problem List  ?Diagnosis  ? Incarcerated ventral hernia  ? Diastasis recti  ? ?Past Surgical History:  ?Procedure Laterality Date  ? HERNIA REPAIR  ? JOINT REPLACEMENT  ? ? ?Allergies  ?Allergen Reactions  ? Amoxicillin-Pot Clavulanate Diarrhea and Other (See Comments)  ? Codeine Nausea and Other (See Comments)  ? Levofloxacin Other (See Comments)  ?Other reaction(s): severe stomach pain ? ? Simvastatin Other (See Comments)  ?Other reaction(s): myalgia ? ? ?Current Outpatient Medications on File Prior to Visit  ?Medication Sig Dispense Refill  ? amLODIPine (NORVASC) 5 MG tablet Take 1 tablet by mouth once daily  ? apixaban (ELIQUIS) 5 mg tablet Take 5 mg by mouth 2 (two) times daily  ? losartan (COZAAR) 100 MG tablet Take 1 tablet by mouth once daily  ? rosuvastatin (CRESTOR) 20 MG tablet Take 1 tablet by mouth every evening  ? tamsulosin (FLOMAX) 0.4 mg capsule 1 capsule  ? ?No current facility-administered medications on file prior to visit.  ? ?History reviewed. No pertinent family history.  ? ?Social History  ? ?Tobacco Use   ?Smoking Status Former  ? Types: Cigarettes  ? Quit date: 34  ? Years since quitting: 56.2  ?Smokeless Tobacco Never  ? ? ?Social History  ? ?Socioeconomic History  ? Marital status: Married  ?Tobacco Use  ? Smoking status: Former  ?Types: Cigarettes  ?Quit date: 32  ?Years since quitting: 56.2  ? Smokeless tobacco: Never  ?Substance and Sexual Activity  ? Alcohol use: Yes  ? Drug use: Not Currently  ? ?Objective:  ? ?Vitals:  ?10/25/21 0924  ?BP: 138/82  ?Pulse: 78  ?Temp: 36.6 ?C (97.9 ?F)  ?SpO2: 98%  ?Weight: 69.2 kg (152 lb 9.6 oz)  ?Height: 167.6 cm (5\' 6" )  ? ?Body mass index is 24.63 kg/m?. ? ?Physical Exam  ? ?He appears well on exam ? ?His abdomen is soft and nontender. There is a small hernia above the umbilicus which feels to be about 2 to 3 cm in size ? ?Labs, Imaging and Diagnostic Testing: ? ?I reviewed his previous CT scan notes from cardiology ? ?Assessment and Plan:  ? ?Diagnoses and all orders for this visit: ? ?Umbilical hernia without obstruction and without gangrene ? ? ? ?At this point we again discussed his diagnosis. We also discussed the laparoscopic and open techniques. He now wishes to proceed with just an open repair with mesh as an outpatient. I again discussed the procedure in detail. We discussed the risks which includes but is not limited to bleeding, infection, injury to surrounding structures, the  use of mesh, hernia recurrence, cardiopulmonary issues, etc. He has already received cardiac clearance. He will stop his Eliquis 2 days preoperatively. Surgery will be scheduled.  ?

## 2021-11-11 NOTE — Progress Notes (Signed)
Anesthesia Chart Review: ?Same day workup ? ?Follows with cardiology for history of CAD (remote angioplasty of the LAD 1995), and paroxysmal atrial fibrillation.  He had cryptogenic stroke with subsequent ILR placement and was found to have atrial fibrillation.  He is maintained on Eliquis.  Patient last seen by Dr. Lovena Le 08/06/2021 at which time ILR was removed due to it approaching RRT.  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  Cardiac clearance per telephone encounter 09/30/2021, "Chart reviewed as part of pre-operative protocol coverage. Based on ACC/AHA guidelines, Edward [name] would be at acceptable risk for the planned procedure without further cardiovascular testing. Per office recommendation, Ok for pt to hold Eliquis for 2 days prior to procedure per MD. He should resume as soon as safely possible after procedure. Left detailed VM on patient's VM per DPR to give him update of clearance." ? ?Patient will need day of surgery labs and evaluation. ? ?EKG 08/20/2020: NSR.  Rate 65 ? ?TEE 02/16/2018: ?- Left ventricle: The cavity size was normal. There was mild  ?  concentric hypertrophy. Systolic function was normal. The  ?  estimated ejection fraction was in the range of 55% to 60%. Wall  ?  motion was normal; there were no regional wall motion  ?  abnormalities.  ?- Aortic valve: Trileaflet. Mild to moderate eccentric AI, 2 jets,  ?  the larger jet is toward the anterior mitral valve leaflet.  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  ?- Left atrium: Mildly dilated. No evidence of thrombus in the  ?  atrial cavity or appendage.  ?- Right atrium: No evidence of thrombus in the atrial cavity or  ?  appendage.  ?- Atrial septum: There is a small PFO by saline microbubble  ?  contrast which demonstrates intermittent right to left flow.  ? ?Impressions:  ? ?- Small PFO with intermittent right to left flow noted by saline  ?  microbubble contrast. This can  be a possible mechanism for his  ?  TIA. Consider referral to Dr. Burt Knack with the structural heart  ?  clinic for closure evaluation. ? ?Exercise tolerance test 11/22/2015: ?Impressions: ?1.  Clinically negative for ischemia. ?2.  EKG nondiagnostic for ischemia. ?3.  Poor exercise duration that limits the prognostic ability of the test somewhat. ? ? ?Karoline Caldwell, PA-C ?[facility]/Anesthesiology ?Phone [phone] ?11/11/2021 12:43 PM ? ?

## 2021-11-11 NOTE — Anesthesia Preprocedure Evaluation (Addendum)
Anesthesia Evaluation  ?Patient identified by MRN, date of birth, ID band ?Patient awake ? ? ? ?Reviewed: ?Allergy & Precautions, H&P , NPO status , Patient's Chart, lab work & pertinent test results ? ?Airway ?Mallampati: II ? ? ?Neck ROM: full ? ? ? Dental ?  ?Pulmonary ?former smoker,  ?  ?breath sounds clear to auscultation ? ? ? ? ? ? Cardiovascular ?hypertension, + CAD  ?+ dysrhythmias Atrial Fibrillation  ?Rhythm:regular Rate:Normal ? ? ?  ?Neuro/Psych ?TIA  ? GI/Hepatic ?  ?Endo/Other  ? ? Renal/GU ?  ? ?  ?Musculoskeletal ? ?(+) Arthritis ,  ? Abdominal ?  ?Peds ? Hematology ?  ?Anesthesia Other Findings ? ? Reproductive/Obstetrics ? ?  ? ? ? ? ? ? ? ? ? ? ? ? ? ?  ?  ? ? ? ? ? ? ? ?Anesthesia Physical ?Anesthesia Plan ? ?ASA: 3 ? ?Anesthesia Plan: General  ? ?Post-op Pain Management:   ? ?Induction: Intravenous ? ?PONV Risk Score and Plan: 2 and Ondansetron, Dexamethasone and Treatment may vary due to age or medical condition ? ?Airway Management Planned: Oral ETT ? ?Additional Equipment:  ? ?Intra-op Plan:  ? ?Post-operative Plan: Extubation in OR ? ?Informed Consent: I have reviewed the patients History and Physical, chart, labs and discussed the procedure including the risks, benefits and alternatives for the proposed anesthesia with the patient or authorized representative who has indicated his/her understanding and acceptance.  ? ? ? ?Dental advisory given ? ?Plan Discussed with: CRNA, Anesthesiologist and Surgeon ? ?Anesthesia Plan Comments: (PAT note by Karoline Caldwell, PA-C: ?Follows with cardiology for history of CAD (remote angioplasty of the LAD 1995), and paroxysmal atrial fibrillation.  He had cryptogenic stroke with subsequent ILR placement and was found to have atrial fibrillation.  He is maintained on Eliquis.  Patient last seen by Dr. Lovena Le 08/06/2021 at which time ILR was removed due to it approaching RRT.  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  Cardiac clearance per telephone encounter 09/30/2021, "Chart reviewed as part of pre-operative protocol coverage.?Based on ACC/AHA guidelines,?Edward [name]?would be at acceptable risk for the planned procedure without further cardiovascular testing. Per office recommendation,?Ok for pt to hold Eliquis for 2 days prior to procedure per MD. He should resume as soon as safely possible after procedure.?Left detailed VM on patient's VM per DPR to give him update of clearance." ? ?Patient will need day of surgery labs and evaluation. ? ?EKG 08/20/2020: NSR.  Rate 65 ? ?TEE 02/16/2018: ?- Left ventricle: The cavity size was normal. There was mild  ???concentric hypertrophy. Systolic function was normal. The  ???estimated ejection fraction was in the range of 55% to 60%. Wall  ???motion was normal; there were no regional wall motion  ???abnormalities.  ?- Aortic valve: Trileaflet. Mild to moderate eccentric AI, 2 jets,  ???the larger jet is toward the anterior mitral valve leaflet.  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  ?- Left atrium: Mildly dilated. No evidence of thrombus in the  ???atrial cavity or appendage.  ?- Right atrium: No evidence of thrombus in the atrial cavity or  ???appendage.  ?- Atrial septum: There is a small PFO by saline microbubble  ???contrast which demonstrates intermittent right to left flow.  ? ?Impressions:  ? ?- Small PFO with intermittent right to left flow noted by saline  ???microbubble contrast. This can be a possible mechanism for his  ???  TIA. Consider referral to Dr. Burt Knack with the structural heart  ???clinic for closure evaluation. ? ?Exercise tolerance test 11/22/2015: ?Impressions: ?1.  Clinically negative for ischemia. ?2.  EKG nondiagnostic for ischemia. ?3.  Poor exercise duration that limits the prognostic ability of the test somewhat. ? ?)  ? ? ? ? ? ?Anesthesia Quick Evaluation ? ?

## 2021-11-12 ENCOUNTER — Encounter (HOSPITAL_COMMUNITY): Payer: Self-pay | Admitting: Surgery

## 2021-11-12 ENCOUNTER — Other Ambulatory Visit: Payer: Self-pay

## 2021-11-12 ENCOUNTER — Encounter (HOSPITAL_COMMUNITY)
Admission: RE | Disposition: A | Discharge status: Home / Self Care | Payer: Self-pay | Source: Ambulatory Visit | Attending: Surgery

## 2021-11-12 ENCOUNTER — Ambulatory Visit (HOSPITAL_COMMUNITY)
Admission: RE | Admit: 2021-11-12 | Discharge: 2021-11-12 | Disposition: A | Discharge status: Home / Self Care | Payer: Medicare Other | Source: Ambulatory Visit | Attending: Surgery | Admitting: Surgery

## 2021-11-12 ENCOUNTER — Ambulatory Visit (HOSPITAL_COMMUNITY): Payer: Medicare Other | Admitting: Physician Assistant

## 2021-11-12 ENCOUNTER — Ambulatory Visit (HOSPITAL_BASED_OUTPATIENT_CLINIC_OR_DEPARTMENT_OTHER): Payer: Medicare Other | Admitting: Physician Assistant

## 2021-11-12 DIAGNOSIS — Z9861 Coronary angioplasty status: Secondary | ICD-10-CM | POA: Diagnosis not present

## 2021-11-12 DIAGNOSIS — K429 Umbilical hernia without obstruction or gangrene: Secondary | ICD-10-CM | POA: Insufficient documentation

## 2021-11-12 DIAGNOSIS — Z87891 Personal history of nicotine dependence: Secondary | ICD-10-CM | POA: Diagnosis not present

## 2021-11-12 DIAGNOSIS — I2510 Atherosclerotic heart disease of native coronary artery without angina pectoris: Secondary | ICD-10-CM

## 2021-11-12 DIAGNOSIS — Z8673 Personal history of transient ischemic attack (TIA), and cerebral infarction without residual deficits: Secondary | ICD-10-CM | POA: Insufficient documentation

## 2021-11-12 DIAGNOSIS — I10 Essential (primary) hypertension: Secondary | ICD-10-CM | POA: Diagnosis not present

## 2021-11-12 DIAGNOSIS — I480 Paroxysmal atrial fibrillation: Secondary | ICD-10-CM | POA: Diagnosis not present

## 2021-11-12 DIAGNOSIS — M1990 Unspecified osteoarthritis, unspecified site: Secondary | ICD-10-CM | POA: Diagnosis not present

## 2021-11-12 HISTORY — DX: Cardiac arrhythmia, unspecified: I49.9

## 2021-11-12 HISTORY — PX: UMBILICAL HERNIA REPAIR: SHX196

## 2021-11-12 LAB — CBC
HCT: 39.8 % (ref 39.0–52.0)
Hemoglobin: 13.3 g/dL (ref 13.0–17.0)
MCH: 31.8 pg (ref 26.0–34.0)
MCHC: 33.4 g/dL (ref 30.0–36.0)
MCV: 95.2 fL (ref 80.0–100.0)
Platelets: 150 10*3/uL (ref 150–400)
RBC: 4.18 MIL/uL — ABNORMAL LOW (ref 4.22–5.81)
RDW: 12.6 % (ref 11.5–15.5)
WBC: 5.3 10*3/uL (ref 4.0–10.5)
nRBC: 0 % (ref 0.0–0.2)

## 2021-11-12 LAB — BASIC METABOLIC PANEL
Anion gap: 9 (ref 5–15)
BUN: 10 mg/dL (ref 8–23)
CO2: 22 mmol/L (ref 22–32)
Calcium: 9.4 mg/dL (ref 8.9–10.3)
Chloride: 108 mmol/L (ref 98–111)
Creatinine, Ser: 1.05 mg/dL (ref 0.61–1.24)
GFR, Estimated: >60 mL/min (ref >60)
Glucose, Bld: 91 mg/dL (ref 70–99)
Potassium: 3.7 mmol/L (ref 3.5–5.1)
Sodium: 139 mmol/L (ref 135–145)

## 2021-11-12 SURGERY — REPAIR, HERNIA, UMBILICAL, ADULT
Anesthesia: General | Site: Abdomen

## 2021-11-12 MED ORDER — ONDANSETRON HCL 4 MG/2ML IJ SOLN: 4.0000 mg | Freq: Four times a day (QID) | INTRAMUSCULAR | Status: DC | PRN

## 2021-11-12 MED ORDER — CEFAZOLIN SODIUM-DEXTROSE 2-4 GM/100ML-% IV SOLN
2.0000 g | INTRAVENOUS | Status: AC
  Administered 2021-11-12: 2 g via INTRAVENOUS
  Filled 2021-11-12: qty 100

## 2021-11-12 MED ORDER — CHLORHEXIDINE GLUCONATE CLOTH 2 % EX PADS: 6.0000 | MEDICATED_PAD | Freq: Once | CUTANEOUS | Status: DC

## 2021-11-12 MED ORDER — ORAL CARE MOUTH RINSE: 15.0000 mL | Freq: Once | OROMUCOSAL | Status: AC

## 2021-11-12 MED ORDER — BUPIVACAINE-EPINEPHRINE 0.25% -1:200000 IJ SOLN
INTRAMUSCULAR | Status: DC | PRN
Start: 2021-11-12 — End: 2021-11-12
  Administered 2021-11-12: 16 mL

## 2021-11-12 MED ORDER — PROPOFOL 10 MG/ML IV BOLUS
INTRAVENOUS | Status: AC
  Filled 2021-11-12: qty 20

## 2021-11-12 MED ORDER — BUPIVACAINE-EPINEPHRINE (PF) 0.25% -1:200000 IJ SOLN
INTRAMUSCULAR | Status: AC
  Filled 2021-11-12: qty 30

## 2021-11-12 MED ORDER — 0.9 % SODIUM CHLORIDE (POUR BTL) OPTIME
TOPICAL | Status: DC | PRN
  Administered 2021-11-12: 1000 mL

## 2021-11-12 MED ORDER — LACTATED RINGERS IV SOLN: INTRAVENOUS | Status: DC | PRN

## 2021-11-12 MED ORDER — ACETAMINOPHEN 500 MG PO TABS
1000.0000 mg | ORAL_TABLET | ORAL | Status: AC
  Administered 2021-11-12: 1000 mg via ORAL
  Filled 2021-11-12: qty 2

## 2021-11-12 MED ORDER — TRAMADOL HCL 50 MG PO TABS
50.0000 mg | ORAL_TABLET | Freq: Four times a day (QID) | ORAL | 0 refills | Status: DC | PRN
Start: 2021-11-12 — End: 2022-07-09

## 2021-11-12 MED ORDER — OXYCODONE HCL 5 MG PO TABS: 5.0000 mg | ORAL_TABLET | Freq: Once | ORAL | Status: DC | PRN

## 2021-11-12 MED ORDER — OXYCODONE HCL 5 MG/5ML PO SOLN: 5.0000 mg | Freq: Once | ORAL | Status: DC | PRN

## 2021-11-12 MED ORDER — FENTANYL CITRATE (PF) 250 MCG/5ML IJ SOLN
INTRAMUSCULAR | Status: AC
  Filled 2021-11-12: qty 5

## 2021-11-12 MED ORDER — ONDANSETRON HCL 4 MG/2ML IJ SOLN
INTRAMUSCULAR | Status: DC | PRN
Start: 2021-11-12 — End: 2021-11-12
  Administered 2021-11-12: 4 mg via INTRAVENOUS

## 2021-11-12 MED ORDER — DEXAMETHASONE SODIUM PHOSPHATE 4 MG/ML IJ SOLN
INTRAMUSCULAR | Status: DC | PRN
  Administered 2021-11-12: 4 mg via INTRAVENOUS

## 2021-11-12 MED ORDER — PROPOFOL 10 MG/ML IV BOLUS
INTRAVENOUS | Status: DC | PRN
Start: 2021-11-12 — End: 2021-11-12
  Administered 2021-11-12: 150 mg via INTRAVENOUS
  Administered 2021-11-12: 50 mg via INTRAVENOUS

## 2021-11-12 MED ORDER — EPHEDRINE SULFATE (PRESSORS) 50 MG/ML IJ SOLN
INTRAMUSCULAR | Status: DC | PRN
Start: 2021-11-12 — End: 2021-11-12
  Administered 2021-11-12 (×2): 10 mg via INTRAVENOUS

## 2021-11-12 MED ORDER — FENTANYL CITRATE (PF) 100 MCG/2ML IJ SOLN: 25.0000 ug | INTRAMUSCULAR | Status: DC | PRN

## 2021-11-12 MED ORDER — FENTANYL CITRATE (PF) 100 MCG/2ML IJ SOLN
INTRAMUSCULAR | Status: DC | PRN
  Administered 2021-11-12 (×2): 50 ug via INTRAVENOUS

## 2021-11-12 MED ORDER — LACTATED RINGERS IV SOLN: INTRAVENOUS | Status: DC

## 2021-11-12 MED ORDER — CHLORHEXIDINE GLUCONATE 0.12 % MT SOLN
15.0000 mL | Freq: Once | OROMUCOSAL | Status: AC
  Administered 2021-11-12: 15 mL via OROMUCOSAL
  Filled 2021-11-12: qty 15

## 2021-11-12 MED ORDER — LIDOCAINE HCL (CARDIAC) PF 100 MG/5ML IV SOSY
PREFILLED_SYRINGE | INTRAVENOUS | Status: DC | PRN
Start: 2021-11-12 — End: 2021-11-12
  Administered 2021-11-12: 60 mg via INTRAVENOUS

## 2021-11-12 SURGICAL SUPPLY — 35 items
ADH SKN CLS APL DERMABOND .7 (GAUZE/BANDAGES/DRESSINGS) ×1
APL PRP STRL LF DISP 70% ISPRP (MISCELLANEOUS) ×1
BAG COUNTER SPONGE SURGICOUNT (BAG) IMPLANT
BAG SPNG CNTER NS LX DISP (BAG)
BLADE CLIPPER SURG (BLADE) ×1 IMPLANT
CANISTER SUCT 3000ML PPV (MISCELLANEOUS) ×1 IMPLANT
CHLORAPREP W/TINT 26 (MISCELLANEOUS) ×3 IMPLANT
COVER SURGICAL LIGHT HANDLE (MISCELLANEOUS) ×3 IMPLANT
DECANTER SPIKE VIAL GLASS SM (MISCELLANEOUS) ×2 IMPLANT
DERMABOND ADVANCED (GAUZE/BANDAGES/DRESSINGS) ×1
DERMABOND ADVANCED .7 DNX12 (GAUZE/BANDAGES/DRESSINGS) ×2 IMPLANT
DRAPE LAPAROTOMY 100X72 PEDS (DRAPES) ×3 IMPLANT
ELECT REM PT RETURN 9FT ADLT (ELECTROSURGICAL) ×2
ELECTRODE REM PT RTRN 9FT ADLT (ELECTROSURGICAL) ×2 IMPLANT
GLOVE SURG SIGNA 7.5 PF LTX (GLOVE) ×3 IMPLANT
GOWN STRL REUS W/ TWL LRG LVL3 (GOWN DISPOSABLE) ×2 IMPLANT
GOWN STRL REUS W/ TWL XL LVL3 (GOWN DISPOSABLE) ×2 IMPLANT
GOWN STRL REUS W/TWL LRG LVL3 (GOWN DISPOSABLE) ×2
GOWN STRL REUS W/TWL XL LVL3 (GOWN DISPOSABLE) ×2
KIT BASIN OR (CUSTOM PROCEDURE TRAY) ×3 IMPLANT
KIT TURNOVER KIT B (KITS) ×3 IMPLANT
NDL HYPO 25GX1X1/2 BEV (NEEDLE) ×2 IMPLANT
NEEDLE HYPO 25GX1X1/2 BEV (NEEDLE) ×2 IMPLANT
NS IRRIG 1000ML POUR BTL (IV SOLUTION) ×3 IMPLANT
PACK GENERAL/GYN (CUSTOM PROCEDURE TRAY) ×3 IMPLANT
PAD ARMBOARD 7.5X6 YLW CONV (MISCELLANEOUS) ×3 IMPLANT
PENCIL SMOKE EVACUATOR (MISCELLANEOUS) ×2 IMPLANT
SUT ETHIBOND NAB CT1 #1 30IN (SUTURE) ×1 IMPLANT
SUT MNCRL AB 4-0 PS2 18 (SUTURE) ×3 IMPLANT
SUT NOVA NAB DX-16 0-1 5-0 T12 (SUTURE) ×2 IMPLANT
SUT VIC AB 3-0 SH 27 (SUTURE) ×2
SUT VIC AB 3-0 SH 27X BRD (SUTURE) ×2 IMPLANT
SYR CONTROL 10ML LL (SYRINGE) ×3 IMPLANT
TOWEL GREEN STERILE (TOWEL DISPOSABLE) ×3 IMPLANT
TOWEL GREEN STERILE FF (TOWEL DISPOSABLE) ×3 IMPLANT

## 2021-11-12 NOTE — Discharge Instructions (Signed)
[facility] _______[facility] ? ?UMBILICAL OR INGUINAL HERNIA REPAIR: POST OP INSTRUCTIONS ? ?Always review your discharge instruction sheet given to you by the facility where your surgery was performed. ?IF YOU HAVE DISABILITY OR FAMILY LEAVE FORMS, YOU MUST BRING THEM TO THE OFFICE FOR PROCESSING.   ?DO NOT GIVE THEM TO YOUR DOCTOR. ? ?1. A  prescription for pain medication may be given to you upon discharge.  Take your pain medication as prescribed, if needed.  If narcotic pain medicine is not needed, then you may take acetaminophen (Tylenol) or ibuprofen (Advil) as needed. ?2. Take your usually prescribed medications unless otherwise directed. ?If you need a refill on your pain medication, please contact your pharmacy.  They will contact our office to request authorization. Prescriptions will not be filled after 5 pm or on week-ends. ?3. You should follow a light diet the first 24 hours after arrival home, such as soup and crackers, etc.  Be sure to include lots of fluids daily.  Resume your normal diet the day after surgery. ?4.Most patients will experience some swelling and bruising around the umbilicus or in the groin and scrotum.  Ice packs and reclining will help.  Swelling and bruising can take several days to resolve.  ?6. It is common to experience some constipation if taking pain medication after surgery.  Increasing fluid intake and taking a stool softener (such as Colace) will usually help or prevent this problem from occurring.  A mild laxative (Milk of Magnesia or Miralax) should be taken according to package directions if there are no bowel movements after 48 hours. ?7. Unless discharge instructions indicate otherwise, you may remove your bandages 24-48 hours after surgery, and you may shower at that time.  You may have steri-strips (small skin tapes) in place directly over the incision.  These strips should be left on the skin for 7-10 days.  If your surgeon used skin glue on the  incision, you may shower in 24 hours.  The glue will flake off over the next 2-3 weeks.  Any sutures or staples will be removed at the office during your follow-up visit. ?8. ACTIVITIES:  You may resume regular (light) daily activities beginning the next day--such as daily self-care, walking, climbing stairs--gradually increasing activities as tolerated.  You may have sexual intercourse when it is comfortable.  Refrain from any heavy lifting or straining until approved by your doctor. ? ?a.You may drive when you are no longer taking prescription pain medication, you can comfortably wear a seatbelt, and you can safely maneuver your car and apply brakes. ?b.RETURN TO WORK:   ?_____________________________________________ ? ?9.You should see your doctor in the office for a follow-up appointment approximately 2-3 weeks after your surgery.  Make sure that you call for this appointment within a day or two after you arrive home to insure a convenient appointment time. ?10.OTHER INSTRUCTIONS: _YOU MAY SHOWER STARTING TOMORROW ?ICE PACK, TYLENOL, ALSO FOR PAIN ?RESUME ELIQUIS TOMORROW ?NO LIFTING MORE THAN 15 TO 20 POUNDS FOR 4 WEEKS________________________ ?   _____________________________________ ? ?WHEN TO CALL YOUR DOCTOR: ?Fever over 101.0 ?Inability to urinate ?Nausea and/or vomiting ?Extreme swelling or bruising ?Continued bleeding from incision. ?Increased pain, redness, or drainage from the incision ? ?The clinic staff is available to answer your questions during regular business hours.  Please don?t hesitate to call and ask to speak to one of the nurses for clinical concerns.  If you have a medical emergency, go to the nearest emergency room or  call 911.  A surgeon from [facility] is always on call at the hospital ? ? ?[address], [place], [place], [place]  [address] ? ? [address] [place], [place], [place]   [address] ?[phone] ? [phone] ? FAX [phone] ?Web site:  [email or web address]  ?

## 2021-11-12 NOTE — Transfer of Care (Signed)
Immediate Anesthesia Transfer of Care Note ? ?Patient: Edward [name] ? ?Procedure(s) Performed: UMBILICAL HERNIA REPAIR (Abdomen) ? ?Patient Location: PACU ? ?Anesthesia Type:General ? ?Level of Consciousness: drowsy and patient cooperative ? ?Airway & Oxygen Therapy: Patient Spontanous Breathing ? ?Post-op Assessment: Report given to RN and Post -op Vital signs reviewed and stable ? ?Post vital signs: Reviewed and stable ? ?Last Vitals:  ?Vitals Value Taken Time  ?BP 128/60 11/12/21 1553  ?Temp    ?Pulse 105 11/12/21 1556  ?Resp 20 11/12/21 1556  ?SpO2 98 % 11/12/21 1556  ?Vitals shown include unvalidated device data. ? ?Last Pain:  ?Vitals:  ? 11/12/21 1250  ?TempSrc:   ?PainSc: 0-No pain  ?   ? ?Patients Stated Pain Goal: 1 (11/12/21 1250) ? ?Complications: No notable events documented. ?

## 2021-11-12 NOTE — Op Note (Signed)
UMBILICAL HERNIA REPAIR  Procedure Note ? ?Edward [name] ?11/12/2021 ? ? ?Pre-op Diagnosis: UMBILICAL HERNIA ?    ?Post-op Diagnosis: same ? ?Procedure(s): ?UMBILICAL HERNIA REPAIR ?(5 mm fascial defect) ? ?Surgeon(s): ?Abigail Miyamoto, MD ? ?Assist: Basilio Cairo, MD [facility] Resident ? ?Anesthesia: General ? ?Staff:  ?Circulator: Virgel Bouquet, RN ?Scrub Person: Coralee North T ? ?Estimated Blood Loss: Minimal ?              ?Findings: The patient was found to have a very small fascial defect which was only about 5 mm above the umbilicus.  There was a moderate amount of lipomatous fat right over the defect which may have been causing some of the symptoms and this was excised as well.  The hernia was repaired primarily ? ?Procedure: The patient was brought to operating identifies correct patient.  He is placed upon the operating table general anesthesia was induced.  His abdomen was then prepped and draped in the usual sterile fashion.  We anesthetized the skin above the umbilicus with Marcaine.  A longitudinal incision was then made with a scalpel.  This was carried down to what appeared to be a hernia sac.  Further excision revealed this to be lipomatous fat which was excised.  A very small fascial defect and then be seen with tiny preperitoneal vessel coming up through the hole.  The fascial defect was 5 mm or less.  We elected to open this further at the fascia and then opened up the underlying peritoneum to evaluate for any further fascial defects.  We inserted a finger through the opening and were able to easily evaluate several inches in all directions with palpation and there were no other fascial defects identified.  At this point, the decision made to close the fascial defect primarily.  This was done with 2 separate figure-of-eight #1 Ethibond sutures.  The fascia was anesthetized further with Marcaine.  Hemostasis was achieved with the cautery.  The subcutaneous tissue was then closed with  interrupted 3-0 Vicryl sutures and the skin was closed with running 4-0 Monocryl.  Dermabond was then applied.  The patient tolerated the procedure well.  All the counts were correct at the end of the procedure.  The patient was then extubated in the operating room and taken in stable addition to the recovery room. ?        ? ?Abigail Miyamoto  ? ?Date: 11/12/2021  Time: 3:44 PM ? ? ? ?

## 2021-11-12 NOTE — Interval H&P Note (Signed)
History and Physical Interval Note: no change in H and P ? ?11/12/2021 ?2:42 PM ? ?Edward [name]  has presented today for surgery, with the diagnosis of UMBILICAL HERNIA.  The various methods of treatment have been discussed with the patient and family. After consideration of risks, benefits and other options for treatment, the patient has consented to  Procedure(s) with comments: ?UMBILICAL HERNIA REPAIR WITH MESH (N/A) - LMA as a surgical intervention.  The patient's history has been reviewed, patient examined, no change in status, stable for surgery.  I have reviewed the patient's chart and labs.  Questions were answered to the patient's satisfaction.   ? ? ?Abigail Miyamoto ? ? ?

## 2021-11-12 NOTE — Anesthesia Procedure Notes (Signed)
Procedure Name: LMA Insertion ?Date/Time: 11/12/2021 3:09 PM ?Performed by: Tillman Abide, CRNA ?Pre-anesthesia Checklist: Patient identified, Emergency Drugs available, Suction available and Patient being monitored ?Patient Re-evaluated:Patient Re-evaluated prior to induction ?Oxygen Delivery Method: Circle System Utilized ?Preoxygenation: Pre-oxygenation with 100% oxygen ?Induction Type: IV induction ?Ventilation: Mask ventilation without difficulty ?LMA: LMA inserted ?LMA Size: 4.0 ?Number of attempts: 1 ?Placement Confirmation: positive ETCO2 ?Tube secured with: Tape ?Dental Injury: Teeth and Oropharynx as per pre-operative assessment  ? ? ? ? ?

## 2021-11-13 ENCOUNTER — Encounter (HOSPITAL_COMMUNITY): Payer: Self-pay | Admitting: Surgery

## 2021-11-13 NOTE — Anesthesia Postprocedure Evaluation (Signed)
Anesthesia Post Note ? ?Patient: Edward [name] ? ?Procedure(s) Performed: UMBILICAL HERNIA REPAIR (Abdomen) ? ?  ? ?Patient location during evaluation: PACU ?Anesthesia Type: General ?Level of consciousness: awake ?Pain management: pain level controlled ?Vital Signs Assessment: post-procedure vital signs reviewed and stable ?Respiratory status: spontaneous breathing and respiratory function stable ?Cardiovascular status: stable ?Postop Assessment: no apparent nausea or vomiting ?Anesthetic complications: no ? ? ?No notable events documented. ? ?Last Vitals:  ?Vitals:  ? 11/12/21 1624 11/12/21 1638  ?BP: 132/63 (!) 147/70  ?Pulse: 84 79  ?Resp: 16 (!) 21  ?Temp:  (!) 36.2 ?C  ?SpO2: 99% 100%  ?  ?Last Pain:  ?Vitals:  ? 11/12/21 1553  ?TempSrc:   ?PainSc: 0-No pain  ? ? ?  ?  ?  ?  ?  ?  ? ?Mellody Dance ? ? ? ? ?

## 2021-12-05 NOTE — Progress Notes (Signed)
?Electrophysiology Office Note:   ? ?Date:  12/07/2021  ? ?ID:  Edward [name], DOB 1941/07/15, MRN [id number] ? ?PCP:  Prince Solian, MD  ?[facility] Cardiologist:  Peter Martinique, MD  ? ?Referring MD: Evans Lance, MD  ? ?Chief Complaint: Watchman consult ? ?History of Present Illness:   ? ?Edward [name] is a 81 y.o. male who presents for an evaluation of watchman consult at the request of Dr. Lovena Le. Their medical history includes hypertension, hyperlipidemia, stroke, coronary artery disease.  The patient last all Dr. Lovena Le August 14, 2021.  At that appointment he reported being interested in [place] given his strong desire to avoid long-term exposure anticoagulation.  At that appointment a loop recorder was also removed. ? ? ?  ?Past Medical History:  ?Diagnosis Date  ? Arthritis   ? right hand,  left foot  ? At risk for sleep apnea   ? STOP-BANG = 4    SENT TO PCP 02-10-215  ? BPH (benign prostatic hypertrophy)   ? Coronary artery disease CARIOLOGIST-- DR Wynonia Lawman  ? ANGIOPLASTY TO LAD  ? COVID 2022  ? mild case  ? Dysrhythmia   ? A-fib  ? Elevated PSA   ? Frequency of urination   ? History of kidney stones   ? Hyperlipidemia   ? Hypertension   ? Nocturia   ? Spermatocele   ? left  ? Stroke [facility])   ? TIA's  ? Urgency of urination   ? Vertigo   ? ? ?Past Surgical History:  ?Procedure Laterality Date  ? CORONARY ANGIOPLASTY  1995  DR Martinique  ? LAD  ? CYSTOSCOPY N/A 09/18/2015  ? Procedure: CYSTOSCOPY;  Surgeon: Festus Aloe, MD;  Location: [facility];  Service: Urology;  Laterality: N/A;  ? CYSTOSCOPY WITH RETROGRADE PYELOGRAM, URETEROSCOPY AND STENT PLACEMENT Left 03/01/2013  ? Procedure: CYSTOSCOPY WITH LEFT RETROGRADE PYELOGRAM, LEFT URETEROSCOPY and stent PLACEMENT;  Surgeon: Fredricka Bonine, MD;  Location: [facility];  Service: Urology;  Laterality: Left;  ? FOOT FUSION Left   ? SECONDARY TO FX'S  ? GREEN LIGHT LASER TURP (TRANSURETHRAL RESECTION OF PROSTATE N/A 10/04/2013  ?  Procedure: GREEN LIGHT LASER TURP (TRANSURETHRAL RESECTION OF PROSTATE;  Surgeon: Fredricka Bonine, MD;  Location: [facility];  Service: Urology;  Laterality: N/A;  ? INGUINAL HERNIA REPAIR Bilateral   ? LOOP RECORDER INSERTION N/A 04/20/2018  ? Procedure: LOOP RECORDER INSERTION;  Surgeon: Evans Lance, MD;  Location: [facility];  Service: Cardiovascular;  Laterality: N/A;  ? SHOULDER ARTHROSCOPY Right 2004  ? SHOULDER ARTHROSCOPY WITH DEBRIDEMENT AND BICEP TENDON REPAIR Left 04-28-2011  ? SPERMATOCELECTOMY Left 10/04/2013  ? Procedure: LEFT SPERMATOCELECTOMY;  Surgeon: Fredricka Bonine, MD;  Location: [facility];  Service: Urology;  Laterality: Left;  ? TEE WITHOUT CARDIOVERSION N/A 02/16/2018  ? Procedure: TRANSESOPHAGEAL ECHOCARDIOGRAM (TEE);  Surgeon: Pixie Casino, MD;  Location: [place];  Service: Cardiovascular;  Laterality: N/A;  ? TRANSURETHRAL RESECTION OF PROSTATE N/A 12/11/2014  ? Procedure: CYSTOSCOPY, CLOT EVACUATION, WITH FULGURATION ;  Surgeon: Irine Seal, MD;  Location: [facility];  Service: Urology;  Laterality: N/A;  ? TRANSURETHRAL RESECTION OF PROSTATE N/A 09/18/2015  ? Procedure: TRANSURETHRAL RESECTION OF THE PROSTATE (TURP);  Surgeon: Festus Aloe, MD;  Location: [facility];  Service: Urology;  Laterality: N/A;  ? UMBILICAL HERNIA REPAIR  06-02-2008  ? UMBILICAL HERNIA REPAIR N/A 11/12/2021  ? Procedure: UMBILICAL HERNIA REPAIR;  Surgeon: Coralie Keens, MD;  Location:  [place] [facility];  Service: General;  Laterality: N/A;  LMA  ? ? ?Current Medications: ?Current Meds  ?Medication Sig  ? acetaminophen (TYLENOL) 500 MG tablet Take 500 mg by mouth every 6 (six) hours as needed for moderate pain or mild pain.  ? amLODipine (NORVASC) 5 MG tablet TAKE 1 TABLET BY MOUTH  DAILY (Patient taking differently: Take 5 mg by mouth daily in the afternoon. 1300-1400)  ? apixaban (ELIQUIS) 5 MG TABS tablet Take 1 tablet (5 mg total) by mouth 2 (two) times daily. (Patient taking differently: Take 5 mg  by mouth 2 (two) times daily. 0700 & 1900)  ? Artificial Tear Ointment (DRY EYES OP) Place 1 drop into both eyes daily as needed (Dry or red eye).  ? budesonide-formoterol (SYMBICORT) 160-4.5 MCG/ACT inhaler Inhale 1 puff into the lungs 2 (two) times daily as needed (for shortness of breath).  ? Coenzyme Q10 (CO Q 10) 100 MG CAPS Take 100 mg by mouth daily at 12 noon. 1300-1400  ? losartan (COZAAR) 100 MG tablet TAKE 1 TABLET BY MOUTH  DAILY (Patient taking differently: Take 100 mg by mouth every evening. 1900)  ? rosuvastatin (CRESTOR) 20 MG tablet TAKE 1 TABLET BY MOUTH IN  THE EVENING (Patient taking differently: 1900)  ? tamsulosin (FLOMAX) 0.4 MG CAPS capsule Take 0.4 mg by mouth at bedtime. 2000  ? traMADol (ULTRAM) 50 MG tablet Take 1 tablet (50 mg total) by mouth every 6 (six) hours as needed for moderate pain or severe pain.  ?  ? ?Allergies:   Amoxicillin-pot clavulanate, Codeine, Levofloxacin, and Zocor [simvastatin]  ? ?Social History  ? ?Socioeconomic History  ? Marital status: Married  ?  Spouse name: Not on file  ? Number of children: 0  ? Years of education: Not on file  ? Highest education level: Not on file  ?Occupational History  ? Occupation: Acupuncturist  ?  Comment: retired  ?Tobacco Use  ? Smoking status: Former  ?  Packs/day: 0.50  ?  Years: 3.00  ?  Pack years: 1.50  ?  Types: Cigarettes  ?  Quit date: 12/18/1970  ?  Years since quitting: 51.0  ? Smokeless tobacco: Never  ?Vaping Use  ? Vaping Use: Never used  ?Substance and Sexual Activity  ? Alcohol use: Yes  ?  Comment: OCCASIONAL  ? Drug use: No  ? Sexual activity: Not on file  ?Other Topics Concern  ? Not on file  ?Social History Narrative  ? Not on file  ? ?Social Determinants of Health  ? ?Financial Resource Strain: Not on file  ?Food Insecurity: Not on file  ?Transportation Needs: Not on file  ?Physical Activity: Not on file  ?Stress: Not on file  ?Social Connections: Not on file  ?  ? ?Family History: ?The patient's family  history is not on file. ? ?ROS:   ?Please see the history of present illness.    ?All other systems reviewed and are negative. ? ?EKGs/Labs/Other Studies Reviewed:   ? ?The following studies were reviewed today: ? ?February 12, 2021 Loop recorder interrogation showed less than 0.1% A-fib burden. ? ? ? ? ?Recent Labs: ?11/12/2021: BUN 10; Creatinine, Ser 1.05; Hemoglobin 13.3; Platelets 150; Potassium 3.7; Sodium 139  ?Recent Lipid Panel ?   ?Component Value Date/Time  ? CHOL 117 01/15/2018 0453  ? TRIG 64 01/15/2018 0453  ? HDL 41 01/15/2018 0453  ? CHOLHDL 2.9 01/15/2018 0453  ? VLDL 13 01/15/2018 0453  ? Emily 63  01/15/2018 0453  ? ? ?Physical Exam:   ? ?VS:  BP 134/64   Pulse 64   Ht 5\' 6"  (1.676 m)   Wt 150 lb (68 kg)   SpO2 96%   BMI 24.21 kg/m?    ? ?Wt Readings from Last 3 Encounters:  ?12/06/21 150 lb (68 kg)  ?11/12/21 150 lb (68 kg)  ?08/14/21 154 lb 6.4 oz (70 kg)  ?  ? ?GEN:  Well nourished, well developed in no acute distress ?HEENT: Normal ?NECK: No JVD; No carotid bruits ?LYMPHATICS: No lymphadenopathy ?CARDIAC: RRR, no murmurs, rubs, gallops ?RESPIRATORY:  Clear to auscultation without rales, wheezing or rhonchi  ?ABDOMEN: Soft, non-tender, non-distended ?MUSCULOSKELETAL:  No edema; No deformity  ?SKIN: Warm and dry ?NEUROLOGIC:  Alert and oriented x 3 ?PSYCHIATRIC:  Normal affect  ? ? ?  ? ?ASSESSMENT:   ? ?1. Paroxysmal atrial fibrillation (HCC)   ?2. TIA (transient ischemic attack)   ?3. Coronary artery disease involving native coronary artery of native heart without angina pectoris   ?4. Primary hypertension   ? ?PLAN:   ? ?In order of problems listed above: ? ?#Paroxysmal atrial fibrillation ?#TIA ?#Coronary artery disease ?#Hypertension ?The patient presents for watchman consideration.  He has a strong desire to avoid long-term exposure anticoagulation which I think is reasonable.  I discussed the Watchman procedure in detail include the risks and recovery.  He is planning on discussing the  Watchman procedure with Dr. Martinique before making a final decision which I think is very reasonable.  He will let us know if he would like to proceed. ? ?I have seen Lysbeth Penner in the office today who i

## 2021-12-06 ENCOUNTER — Ambulatory Visit (INDEPENDENT_AMBULATORY_CARE_PROVIDER_SITE_OTHER): Payer: Medicare Other | Admitting: Cardiology

## 2021-12-06 ENCOUNTER — Encounter: Payer: Self-pay | Admitting: Cardiology

## 2021-12-06 VITALS — BP 134/64 | HR 64 | Ht 66.0 in | Wt 150.0 lb

## 2021-12-06 DIAGNOSIS — I2510 Atherosclerotic heart disease of native coronary artery without angina pectoris: Secondary | ICD-10-CM

## 2021-12-06 DIAGNOSIS — I480 Paroxysmal atrial fibrillation: Secondary | ICD-10-CM

## 2021-12-06 DIAGNOSIS — I10 Essential (primary) hypertension: Secondary | ICD-10-CM | POA: Diagnosis not present

## 2021-12-06 DIAGNOSIS — G459 Transient cerebral ischemic attack, unspecified: Secondary | ICD-10-CM | POA: Diagnosis not present

## 2021-12-06 NOTE — Patient Instructions (Addendum)
Medication Instructions:  ?Your physician recommends that you continue on your current medications as directed. Please refer to the Current Medication list given to you today. ?*If you need a refill on your cardiac medications before your next appointment, please call your pharmacy* ? ?Lab Work: ?None. ?If you have labs (blood work) drawn today and your tests are completely normal, you will receive your results only by: ?MyChart Message (if you have MyChart) OR ?A paper copy in the mail ?If you have any lab test that is abnormal or we need to change your treatment, we will call you to review the results. ? ?Testing/Procedures: ?None. ? ?Follow-Up: ?At [facility], you and your health needs are our priority.  As part of our continuing mission to provide you with exceptional heart care, we have created designated Provider Care Teams.  These Care Teams include your primary Cardiologist (physician) and Advanced Practice Providers (APPs -  Physician Assistants and Nurse Practitioners) who all work together to provide you with the care you need, when you need it. ? ?Your physician wants you to follow-up in: Call Dr. Lovena Neighbours nurse, Inetta Fermo, if you wish to move forward with watchman device. [phone] ? ?We recommend signing up for the patient portal called "MyChart".  Sign up information is provided on this After Visit Summary.  MyChart is used to connect with patients for Virtual Visits (Telemedicine).  Patients are able to view lab/test results, encounter notes, upcoming appointments, etc.  Non-urgent messages can be sent to your provider as well.   ?To learn more about what you can do with MyChart, go to [email or web address].   ? ?Any Other Special Instructions Will Be Listed Below (If Applicable). ? ? ? ? ?  ? ? ? ? ?  ? ? ?

## 2021-12-09 ENCOUNTER — Other Ambulatory Visit: Payer: Medicare Other | Admitting: *Deleted

## 2021-12-09 ENCOUNTER — Encounter: Payer: Self-pay | Admitting: *Deleted

## 2021-12-09 ENCOUNTER — Telehealth: Payer: Self-pay | Admitting: Cardiology

## 2021-12-09 DIAGNOSIS — Z01818 Encounter for other preprocedural examination: Secondary | ICD-10-CM

## 2021-12-09 DIAGNOSIS — I480 Paroxysmal atrial fibrillation: Secondary | ICD-10-CM

## 2021-12-09 LAB — BASIC METABOLIC PANEL
BUN/Creatinine Ratio: 13 (ref 10–24)
BUN: 14 mg/dL (ref 8–27)
CO2: 25 mmol/L (ref 20–29)
Calcium: 9.8 mg/dL (ref 8.6–10.2)
Chloride: 104 mmol/L (ref 96–106)
Creatinine, Ser: 1.05 mg/dL (ref 0.76–1.27)
Glucose: 88 mg/dL (ref 70–99)
Potassium: 3.9 mmol/L (ref 3.5–5.2)
Sodium: 137 mmol/L (ref 134–144)
eGFR: 72 mL/min/{1.73_m2} (ref >59)

## 2021-12-09 NOTE — Telephone Encounter (Signed)
Patient is requesting to schedule watchman procedure as previously discussed. ?

## 2021-12-09 NOTE — Telephone Encounter (Signed)
Spoke to patient about watchman evaluation testing. He will come by today for lab work and get CT instructions from RN.  ?Patient verbalized agreement and understanding.  ?

## 2021-12-16 ENCOUNTER — Ambulatory Visit (HOSPITAL_COMMUNITY): Payer: Medicare Other | Attending: Internal Medicine

## 2021-12-16 DIAGNOSIS — I480 Paroxysmal atrial fibrillation: Secondary | ICD-10-CM | POA: Diagnosis present

## 2021-12-16 DIAGNOSIS — Z01818 Encounter for other preprocedural examination: Secondary | ICD-10-CM | POA: Diagnosis present

## 2021-12-16 LAB — ECHOCARDIOGRAM COMPLETE
Area-P 1/2: 3.61 cm2
P 1/2 time: 348 msec
S' Lateral: 3.7 cm

## 2021-12-18 ENCOUNTER — Telehealth (HOSPITAL_COMMUNITY): Payer: Self-pay | Admitting: *Deleted

## 2021-12-18 NOTE — Telephone Encounter (Signed)
Reaching out to patient to offer assistance regarding upcoming cardiac imaging study; pt verbalizes understanding of appt date/time, parking situation and where to check in, pre-test NPO status; name and call back number provided for further questions should they arise ? ?Gordy Clement RN Navigator Cardiac Imaging ?[facility] ?[phone] office ?[phone] cell ? ?Patient aware to arrive at 1:30pm. ?

## 2021-12-19 ENCOUNTER — Ambulatory Visit (HOSPITAL_COMMUNITY)
Admission: RE | Admit: 2021-12-19 | Discharge: 2021-12-19 | Disposition: A | Discharge status: Home / Self Care | Payer: Medicare Other | Source: Ambulatory Visit | Attending: Cardiology | Admitting: Cardiology

## 2021-12-19 DIAGNOSIS — Z01818 Encounter for other preprocedural examination: Secondary | ICD-10-CM | POA: Diagnosis present

## 2021-12-19 DIAGNOSIS — I480 Paroxysmal atrial fibrillation: Secondary | ICD-10-CM | POA: Diagnosis present

## 2021-12-19 MED ORDER — IOHEXOL 350 MG/ML SOLN
100.0000 mL | Freq: Once | INTRAVENOUS | Status: AC | PRN
  Administered 2021-12-19: 100 mL via INTRAVENOUS

## 2021-12-23 ENCOUNTER — Other Ambulatory Visit: Payer: Self-pay

## 2021-12-23 ENCOUNTER — Telehealth: Payer: Self-pay

## 2021-12-23 DIAGNOSIS — I480 Paroxysmal atrial fibrillation: Secondary | ICD-10-CM

## 2021-12-23 DIAGNOSIS — G459 Transient cerebral ischemic attack, unspecified: Secondary | ICD-10-CM

## 2021-12-23 NOTE — Telephone Encounter (Signed)
-----   Message from Lanier Prude, MD sent at 12/22/2021  3:26 PM EDT ----- ?OK to proceed with watchman evaluation. ? ?Sheria Lang T. Lalla Brothers, MD, [facility], [facility] ?Cardiac Electrophysiology ? ? ?

## 2021-12-23 NOTE — Telephone Encounter (Signed)
Reviewed results with patient who verbalized understanding.   ?The patient reports he would like to proceed with LAAO 01/09/2022 and is fine to postpone his visit with Dr. [place] until after the procedure. ?Scheduled the patient for pre-procedure visit 01/06/2022, LAAO 5/25, and post-procedure visit with Dr. [place] 02/12/2022. ?The patient was grateful for call and agrees with plan.  ?

## 2021-12-31 NOTE — Progress Notes (Signed)
[facility]                                     Cardiology Office Note:    Date:  01/06/2022   ID:  Edward [name], DOB March 09, 1941, MRN [id number]  PCP:  Prince Solian, MD  [facility] Cardiologist:  Peter Martinique, MD  [facility] Electrophysiologist: Dr. Lovena Le, MD/ Dr. Quentin Ore, MD [place])  Referring MD: Prince Solian, MD   Chief Complaint  Patient presents with   Follow-up    Pre Watchman visit    History of Present Illness:    Edward [name] is a 81 y.o. male with a hx of cryogenic stroke s/p ILR found to have paroxysmal atrial fibrillation who is currently taking Eliquis. Also a hx of CAD s/p angioplasty of the LAD 1995, HLD, and HTN who was referred to Dr. Quentin Ore 12/06/21 for the evaluation of possible Watchman.   [name] is followed by Dr. Martinique for his cardiology care. He was noted to have had a TIA in 2018 with recurrent episode 12/2017. His evaluation included an echocardiogram, carotid ultrasound, CT and MRI studies of the brain. All of these were unrevealing. There was no evidence of cortical infarction on his neuroimaging studies.  He had no arrhythmia documented. His TEE demonstrated a small PFO and the patient was seen by Dr. Burt Knack for consideration of PFO closure however after discussion with Neurology, this was not felt to be indicated. He subsequently had an implantable loop recorder placed by Dr Lovena Le which showed new atrial fibrillation and he was therefore started on Eliquis.    He has a history of CAD with remote angioplasty of the LAD in 1995. He has never had recurrent angina.   [name] last saw Dr. Lovena Le 08/14/2021 for atrial fibrillation and IRL follow up and showed interest in Watchman implant to avoid long term exposure to anticoagulation. IRL was nearing end of life and the patient wished to have this removed.   On Dr. Mardene Speak evaluation, plan was to proceed with Watchman workup with CT imagining that showed anatomy  suitable for implant.   Today he is here alone and reports that he has been well from a CV standpoint with no chest pain, palpitations, LE edema, orthopnea, bleeding in stool or urine, dizziness, or syncope. All questions answered regarding scheduled procedure for Thursday, May 25.   Past Medical History:  Diagnosis Date   Arthritis    right hand,  left foot   At risk for sleep apnea    STOP-BANG = 4    SENT TO PCP 02-10-215   BPH (benign prostatic hypertrophy)    Coronary artery disease CARIOLOGIST-- DR Wynonia Lawman   ANGIOPLASTY TO LAD   COVID 2022   mild case   Dysrhythmia    A-fib   Elevated PSA    Frequency of urination    History of kidney stones    Hyperlipidemia    Hypertension    Nocturia    Spermatocele    left   Stroke [facility])    TIA's   Urgency of urination    Vertigo    Past Surgical History:  Procedure Laterality Date   CORONARY ANGIOPLASTY  1995  DR Martinique   LAD   CYSTOSCOPY N/A 09/18/2015   Procedure: CYSTOSCOPY;  Surgeon: Festus Aloe, MD;  Location: [facility];  Service: Urology;  Laterality: N/A;   CYSTOSCOPY WITH  RETROGRADE PYELOGRAM, URETEROSCOPY AND STENT PLACEMENT Left 03/01/2013   Procedure: CYSTOSCOPY WITH LEFT RETROGRADE PYELOGRAM, LEFT URETEROSCOPY and stent PLACEMENT;  Surgeon: Fredricka Bonine, MD;  Location: [facility];  Service: Urology;  Laterality: Left;   FOOT FUSION Left    SECONDARY TO FX'S   GREEN LIGHT LASER TURP (TRANSURETHRAL RESECTION OF PROSTATE N/A 10/04/2013   Procedure: GREEN LIGHT LASER TURP (TRANSURETHRAL RESECTION OF PROSTATE;  Surgeon: Fredricka Bonine, MD;  Location: [facility];  Service: Urology;  Laterality: N/A;   INGUINAL HERNIA REPAIR Bilateral    LOOP RECORDER INSERTION N/A 04/20/2018   Procedure: LOOP RECORDER INSERTION;  Surgeon: Evans Lance, MD;  Location: [place] [facility];  Service: Cardiovascular;  Laterality: N/A;   SHOULDER ARTHROSCOPY Right 2004   SHOULDER ARTHROSCOPY WITH DEBRIDEMENT AND  BICEP TENDON REPAIR Left 04-28-2011   SPERMATOCELECTOMY Left 10/04/2013   Procedure: LEFT SPERMATOCELECTOMY;  Surgeon: Fredricka Bonine, MD;  Location: [facility];  Service: Urology;  Laterality: Left;   TEE WITHOUT CARDIOVERSION N/A 02/16/2018   Procedure: TRANSESOPHAGEAL ECHOCARDIOGRAM (TEE);  Surgeon: Pixie Casino, MD;  Location: [place];  Service: Cardiovascular;  Laterality: N/A;   TRANSURETHRAL RESECTION OF PROSTATE N/A 12/11/2014   Procedure: CYSTOSCOPY, CLOT EVACUATION, WITH FULGURATION ;  Surgeon: Irine Seal, MD;  Location: [facility];  Service: Urology;  Laterality: N/A;   TRANSURETHRAL RESECTION OF PROSTATE N/A 09/18/2015   Procedure: TRANSURETHRAL RESECTION OF THE PROSTATE (TURP);  Surgeon: Festus Aloe, MD;  Location: [facility];  Service: Urology;  Laterality: N/A;   UMBILICAL HERNIA REPAIR  [id number]   UMBILICAL HERNIA REPAIR N/A 11/12/2021   Procedure: UMBILICAL HERNIA REPAIR;  Surgeon: Coralie Keens, MD;  Location: [place];  Service: General;  Laterality: N/A;  LMA    Current Medications: Current Meds  Medication Sig   acetaminophen (TYLENOL) 500 MG tablet Take 500 mg by mouth every 6 (six) hours as needed for moderate pain or mild pain.   amLODipine (NORVASC) 5 MG tablet TAKE 1 TABLET BY MOUTH  DAILY   apixaban (ELIQUIS) 5 MG TABS tablet Take 1 tablet (5 mg total) by mouth 2 (two) times daily.   Coenzyme Q10 (CO Q 10) 100 MG CAPS Take 100 mg by mouth daily at 12 noon. 1300-1400   losartan (COZAAR) 100 MG tablet TAKE 1 TABLET BY MOUTH  DAILY   rosuvastatin (CRESTOR) 20 MG tablet TAKE 1 TABLET BY MOUTH IN  THE EVENING   tamsulosin (FLOMAX) 0.4 MG CAPS capsule Take 0.4 mg by mouth at bedtime. 2000     Allergies:   Amoxicillin-pot clavulanate, Codeine, Levofloxacin, and Zocor [simvastatin]   Social History   Socioeconomic History   Marital status: Married    Spouse name: Not on file   Number of children: 0   Years of education: Not on file   Highest education  level: Not on file  Occupational History   Occupation: Acupuncturist    Comment: retired  Tobacco Use   Smoking status: Former    Packs/day: 0.50    Years: 3.00    Pack years: 1.50    Types: Cigarettes    Quit date: 12/18/1970    Years since quitting: 51.0   Smokeless tobacco: Never  Vaping Use   Vaping Use: Never used  Substance and Sexual Activity   Alcohol use: Yes    Comment: OCCASIONAL   Drug use: No   Sexual activity: Not on file  Other Topics Concern   Not on file  Social History Narrative   Not on file   Social Determinants of Health   Financial Resource Strain: Not on file  Food Insecurity: Not on file  Transportation Needs: Not on file  Physical Activity: Not on file  Stress: Not on file  Social Connections: Not on file     Family History: The patient's family history is not on file.  ROS:   Please see the history of present illness.    All other systems reviewed and are negative.  EKGs/Labs/Other Studies Reviewed:    The following studies were reviewed today:  CT cardiac morph/pul 12/19/21:  IMPRESSION: 1.  Wind Sock LAA with no thrombus   2. Landing zone average diameter 23.9 mm suitable for a 27 mm Watchman FLX device   3.  Normal PV anatomy with no anomaly   4.  Elongated atrial septum with no ASD/PFO noted   5.  Normal ascending thoracic aorta 3.6 cm   6.  No pericardial effusion   7.  Moderate bi atrial enlargement   TEE 02/16/2018: Study Conclusions   - Left ventricle: The cavity size was normal. There was mild   concentric hypertrophy. Systolic function was normal. The   estimated ejection fraction was in the range of 55% to 60%. Wall   motion was normal; there were no regional wall motion   abnormalities. - Aortic valve: Trileaflet. Mild to moderate eccentric AI, 2 jets,   the larger jet is toward the anterior mitral valve leaflet. - Aorta: Dilated sinus of valsalva to 4.1 cm. - Left atrium: Mildly dilated. No evidence  of thrombus in the   atrial cavity or appendage. - Right atrium: No evidence of thrombus in the atrial cavity or   appendage. - Atrial septum: There is a small PFO by saline microbubble   contrast which demonstrates intermittent right to left flow.   Impressions:   - Small PFO with intermittent right to left flow noted by saline   microbubble contrast. This can be a possible mechanism for his   TIA. Consider referral to Dr. Burt Knack with the structural heart   clinic for closure evaluation.   EKG:  EKG is ordered today.  The ekg ordered today demonstrates sinus bradycardia with HR 58bpm.   Recent Labs: 11/12/2021: Hemoglobin 13.3; Platelets 150 12/09/2021: BUN 14; Creatinine, Ser 1.05; Potassium 3.9; Sodium 137   Recent Lipid Panel    Component Value Date/Time   CHOL 117 01/15/2018 0453   TRIG 64 01/15/2018 0453   HDL 41 01/15/2018 0453   CHOLHDL 2.9 01/15/2018 0453   VLDL 13 01/15/2018 0453   LDLCALC 63 01/15/2018 0453   Risk Assessment/Calculations:    HAS-BLED score 3 Hypertension Yes  Abnormal renal and liver function (Dialysis, transplant, Cr >2.26 mg/dL /Cirrhosis or Bilirubin >2x Normal or AST/ALT/AP >3x Normal) No  Stroke Yes  Bleeding No  Labile INR (Unstable/high INR) No  Elderly (>65) Yes  Drugs or alcohol (? 8 drinks/week, anti-plt or NSAID) No    CHA2DS2-VASc Score = 6  The patient's score is based upon: CHF History: 0 HTN History: 1 Diabetes History: 0 Stroke History: 2 Vascular Disease History: 1 Age Score: 2 Gender Score: 0  Physical Exam:    VS:  BP 116/80   Pulse 65   Ht 5\' 6"  (1.676 m)   Wt 150 lb 6.4 oz (68.2 kg)   SpO2 98%   BMI 24.28 kg/m     Wt Readings from Last 3 Encounters:  01/06/22 150 lb  6.4 oz (68.2 kg)  12/06/21 150 lb (68 kg)  11/12/21 150 lb (68 kg)    General: Well developed, well nourished, NAD Skin: Warm, dry, intact  Lungs:Clear to ausculation bilaterally. No wheezes, rales, or rhonchi. Breathing is  unlabored. Cardiovascular: RRR with S1 S2. Soft systolic murmur Extremities: No edema.  Neuro: Alert and oriented. No focal deficits. No facial asymmetry. MAE spontaneously. Psych: Responds to questions appropriately with normal affect.    ASSESSMENT/PLAN:    Paroxsymal atrial fibrillation: Followed by Dr. Lovena Le for atrial fibrillation found on IRL after cryptogenic stroke in 2019 who voiced interest in pursuing Watchman implant to avoid long term exposure to [facility]. Watchman imaging with anatomy suitable for implant, now scheduled for 5/25. Instructions reviewed with patient understanding. Obtain BMET, CBC today. Will be eligible for same day discharge.    Hx of CVA: No deficits. Continue current regimen.   CAD: s/p LAD angioplasty 1995 with no anginal symptoms. Continue statin. No ASA due to [facility]. No beta blocker given asymptomatic bradycardia.   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 Noted to have moderate AR on last echocardiogram from 12/16/21 with two separate jets. Continue to follow with surveillance imaging.   HTN: Stable, 116/80. No medication changes today   HLD: Last LDL, 63 on 01/15/18. Continue Crestor.   Medication Adjustments/Labs and Tests Ordered: Current medicines are reviewed at length with the patient today.  Concerns regarding medicines are outlined above.  Orders Placed This Encounter  Procedures   Basic metabolic panel   CBC   EKG 12-Lead   No orders of the defined types were placed in this encounter.   Patient Instructions  Medication Instructions:  Your physician recommends that you continue on your current medications as directed. Please refer to the Current Medication list given to you today.  *If you need a refill on your cardiac medications before your next appointment, please call your pharmacy*   Lab Work: TODAY: BMET, CBC If you have labs (blood work) drawn today and your tests are completely normal, you will receive your results only by: [place] (if you have MyChart) OR A paper copy in the mail If you have any lab test that is abnormal or we need to change your treatment, we will call you to review the results.   Testing/Procedures: SEE INSTRUCTION LETTER   Follow-Up: At [facility], you and your health needs are our priority.  As part of our continuing mission to provide you with exceptional heart care, we have created designated Provider Care Teams.  These Care Teams include your primary Cardiologist (physician) and Advanced Practice Providers (APPs -  Physician Assistants and Nurse Practitioners) who all work together to provide you with the care you need, when you need it.  We recommend signing up for the patient portal called "MyChart".  Sign up information is provided on this After Visit Summary.  MyChart is used to connect with patients for Virtual Visits (Telemedicine).  Patients are able to view lab/test results, encounter notes, upcoming appointments, etc.  Non-urgent messages can be sent to your provider as well.   To learn more about what you can do with MyChart, go to [email or web address].    Your next appointment:   KEEP SCHEDULED FOLLOW-UP  Important Information About Sugar         Lyndel Safe, NP  01/06/2022 10:49 AM    [place]

## 2022-01-06 ENCOUNTER — Ambulatory Visit (INDEPENDENT_AMBULATORY_CARE_PROVIDER_SITE_OTHER): Payer: Medicare Other | Admitting: Cardiology

## 2022-01-06 VITALS — BP 116/80 | HR 65 | Ht 66.0 in | Wt 150.4 lb

## 2022-01-06 DIAGNOSIS — I2510 Atherosclerotic heart disease of native coronary artery without angina pectoris: Secondary | ICD-10-CM | POA: Diagnosis not present

## 2022-01-06 DIAGNOSIS — I480 Paroxysmal atrial fibrillation: Secondary | ICD-10-CM

## 2022-01-06 DIAGNOSIS — E785 Hyperlipidemia, unspecified: Secondary | ICD-10-CM | POA: Diagnosis not present

## 2022-01-06 DIAGNOSIS — G459 Transient cerebral ischemic attack, unspecified: Secondary | ICD-10-CM | POA: Diagnosis not present

## 2022-01-06 DIAGNOSIS — I351 Nonrheumatic aortic (valve) insufficiency: Secondary | ICD-10-CM

## 2022-01-06 DIAGNOSIS — E7800 Pure hypercholesterolemia, unspecified: Secondary | ICD-10-CM

## 2022-01-06 LAB — CBC
Hematocrit: 36.8 % — ABNORMAL LOW (ref 37.5–51.0)
Hemoglobin: 12.4 g/dL — ABNORMAL LOW (ref 13.0–17.7)
MCH: 30.9 pg (ref 26.6–33.0)
MCHC: 33.7 g/dL (ref 31.5–35.7)
MCV: 92 fL (ref 79–97)
Platelets: 172 10*3/uL (ref 150–450)
RBC: 4.01 x10E6/uL — ABNORMAL LOW (ref 4.14–5.80)
RDW: 13 % (ref 11.6–15.4)
WBC: 6.1 10*3/uL (ref 3.4–10.8)

## 2022-01-06 LAB — BASIC METABOLIC PANEL
BUN/Creatinine Ratio: 16 (ref 10–24)
BUN: 14 mg/dL (ref 8–27)
CO2: 24 mmol/L (ref 20–29)
Calcium: 9.5 mg/dL (ref 8.6–10.2)
Chloride: 104 mmol/L (ref 96–106)
Creatinine, Ser: 0.9 mg/dL (ref 0.76–1.27)
Glucose: 94 mg/dL (ref 70–99)
Potassium: 4.3 mmol/L (ref 3.5–5.2)
Sodium: 137 mmol/L (ref 134–144)
eGFR: 87 mL/min/{1.73_m2} (ref >59)

## 2022-01-06 NOTE — Patient Instructions (Signed)
Medication Instructions:  ?Your physician recommends that you continue on your current medications as directed. Please refer to the Current Medication list given to you today.  ?*If you need a refill on your cardiac medications before your next appointment, please call your pharmacy* ? ? ?Lab Work: ?TODAY: BMET, CBC ?If you have labs (blood work) drawn today and your tests are completely normal, you will receive your results only by: ?MyChart Message (if you have MyChart) OR ?A paper copy in the mail ?If you have any lab test that is abnormal or we need to change your treatment, we will call you to review the results. ? ? ?Testing/Procedures: ?SEE INSTRUCTION LETTER ? ?Follow-Up: ?At [facility], you and your health needs are our priority.  As part of our continuing mission to provide you with exceptional heart care, we have created designated Provider Care Teams.  These Care Teams include your primary Cardiologist (physician) and Advanced Practice Providers (APPs -  Physician Assistants and Nurse Practitioners) who all work together to provide you with the care you need, when you need it. ? ?We recommend signing up for the patient portal called "MyChart".  Sign up information is provided on this After Visit Summary.  MyChart is used to connect with patients for Virtual Visits (Telemedicine).  Patients are able to view lab/test results, encounter notes, upcoming appointments, etc.  Non-urgent messages can be sent to your provider as well.   ?To learn more about what you can do with MyChart, go to [email or web address].   ? ?Your next appointment:   ?KEEP SCHEDULED FOLLOW-UP ? ?Important Information About Sugar ? ? ? ? ?  ?

## 2022-01-08 ENCOUNTER — Ambulatory Visit: Payer: Medicare Other | Admitting: Cardiology

## 2022-01-09 ENCOUNTER — Encounter (HOSPITAL_COMMUNITY)
Admission: RE | Disposition: A | Discharge status: Home / Self Care | Payer: Self-pay | Source: Home / Self Care | Attending: Cardiology

## 2022-01-09 ENCOUNTER — Inpatient Hospital Stay (HOSPITAL_COMMUNITY): Payer: Medicare Other | Admitting: Certified Registered Nurse Anesthetist

## 2022-01-09 ENCOUNTER — Encounter (HOSPITAL_COMMUNITY): Payer: Self-pay | Admitting: Cardiology

## 2022-01-09 ENCOUNTER — Inpatient Hospital Stay (HOSPITAL_COMMUNITY)
Admission: RE | Admit: 2022-01-09 | Discharge: 2022-01-09 | DRG: 274 | Disposition: A | Discharge status: Home / Self Care | Payer: Medicare Other | Attending: Cardiology | Admitting: Cardiology

## 2022-01-09 ENCOUNTER — Inpatient Hospital Stay (HOSPITAL_COMMUNITY): Payer: Medicare Other

## 2022-01-09 ENCOUNTER — Other Ambulatory Visit: Payer: Self-pay

## 2022-01-09 DIAGNOSIS — Z885 Allergy status to narcotic agent status: Secondary | ICD-10-CM | POA: Diagnosis not present

## 2022-01-09 DIAGNOSIS — Z8673 Personal history of transient ischemic attack (TIA), and cerebral infarction without residual deficits: Secondary | ICD-10-CM

## 2022-01-09 DIAGNOSIS — Z7901 Long term (current) use of anticoagulants: Secondary | ICD-10-CM | POA: Diagnosis not present

## 2022-01-09 DIAGNOSIS — N400 Enlarged prostate without lower urinary tract symptoms: Secondary | ICD-10-CM | POA: Diagnosis present

## 2022-01-09 DIAGNOSIS — I480 Paroxysmal atrial fibrillation: Principal | ICD-10-CM | POA: Diagnosis present

## 2022-01-09 DIAGNOSIS — Z006 Encounter for examination for normal comparison and control in clinical research program: Secondary | ICD-10-CM

## 2022-01-09 DIAGNOSIS — Z880 Allergy status to penicillin: Secondary | ICD-10-CM | POA: Diagnosis not present

## 2022-01-09 DIAGNOSIS — G459 Transient cerebral ischemic attack, unspecified: Secondary | ICD-10-CM

## 2022-01-09 DIAGNOSIS — Z8616 Personal history of COVID-19: Secondary | ICD-10-CM | POA: Diagnosis not present

## 2022-01-09 DIAGNOSIS — Z79899 Other long term (current) drug therapy: Secondary | ICD-10-CM

## 2022-01-09 DIAGNOSIS — Z87442 Personal history of urinary calculi: Secondary | ICD-10-CM | POA: Diagnosis not present

## 2022-01-09 DIAGNOSIS — Z95818 Presence of other cardiac implants and grafts: Principal | ICD-10-CM

## 2022-01-09 DIAGNOSIS — I2510 Atherosclerotic heart disease of native coronary artery without angina pectoris: Secondary | ICD-10-CM | POA: Diagnosis present

## 2022-01-09 DIAGNOSIS — I4891 Unspecified atrial fibrillation: Secondary | ICD-10-CM

## 2022-01-09 DIAGNOSIS — I10 Essential (primary) hypertension: Secondary | ICD-10-CM | POA: Diagnosis present

## 2022-01-09 DIAGNOSIS — E785 Hyperlipidemia, unspecified: Secondary | ICD-10-CM | POA: Diagnosis present

## 2022-01-09 DIAGNOSIS — Z87891 Personal history of nicotine dependence: Secondary | ICD-10-CM

## 2022-01-09 DIAGNOSIS — Z881 Allergy status to other antibiotic agents status: Secondary | ICD-10-CM | POA: Diagnosis not present

## 2022-01-09 DIAGNOSIS — Z9861 Coronary angioplasty status: Secondary | ICD-10-CM

## 2022-01-09 HISTORY — PX: TEE WITHOUT CARDIOVERSION: SHX5443

## 2022-01-09 HISTORY — PX: LEFT ATRIAL APPENDAGE OCCLUSION: EP1229

## 2022-01-09 HISTORY — DX: Presence of other cardiac implants and grafts: Z95.818

## 2022-01-09 LAB — SURGICAL PCR SCREEN
MRSA, PCR: NEGATIVE
Staphylococcus aureus: NEGATIVE

## 2022-01-09 LAB — TYPE AND SCREEN
ABO/RH(D): B POS
Antibody Screen: NEGATIVE

## 2022-01-09 LAB — ABO/RH: ABO/RH(D): B POS

## 2022-01-09 LAB — ECHO TEE: AV Vena cont: 0.8 cm

## 2022-01-09 LAB — POCT ACTIVATED CLOTTING TIME: Activated Clotting Time: 347 seconds

## 2022-01-09 SURGERY — LEFT ATRIAL APPENDAGE OCCLUSION
Anesthesia: General

## 2022-01-09 MED ORDER — PROPOFOL 10 MG/ML IV BOLUS
INTRAVENOUS | Status: DC | PRN
  Administered 2022-01-09: 130 mg via INTRAVENOUS

## 2022-01-09 MED ORDER — PROTAMINE SULFATE 10 MG/ML IV SOLN
INTRAVENOUS | Status: DC | PRN
Start: 2022-01-09 — End: 2022-01-09
  Administered 2022-01-09 (×3): 10 mg via INTRAVENOUS

## 2022-01-09 MED ORDER — LACTATED RINGERS IV SOLN: INTRAVENOUS | Status: DC | PRN

## 2022-01-09 MED ORDER — SODIUM CHLORIDE 0.9 % IV SOLN: 250.0000 mL | INTRAVENOUS | Status: DC | PRN

## 2022-01-09 MED ORDER — VANCOMYCIN HCL IN DEXTROSE 1-5 GM/200ML-% IV SOLN
1000.0000 mg | INTRAVENOUS | Status: AC
  Administered 2022-01-09: 1000 mg via INTRAVENOUS
  Filled 2022-01-09: qty 200

## 2022-01-09 MED ORDER — IOHEXOL 350 MG/ML SOLN
INTRAVENOUS | Status: DC | PRN
  Administered 2022-01-09 (×3): 10 mL

## 2022-01-09 MED ORDER — ONDANSETRON HCL 4 MG/2ML IJ SOLN
INTRAMUSCULAR | Status: DC | PRN
  Administered 2022-01-09: 4 mg via INTRAVENOUS

## 2022-01-09 MED ORDER — ONDANSETRON HCL 4 MG/2ML IJ SOLN: 4.0000 mg | Freq: Four times a day (QID) | INTRAMUSCULAR | Status: DC | PRN

## 2022-01-09 MED ORDER — SUGAMMADEX SODIUM 200 MG/2ML IV SOLN
INTRAVENOUS | Status: DC | PRN
  Administered 2022-01-09: 200 mg via INTRAVENOUS

## 2022-01-09 MED ORDER — SODIUM CHLORIDE 0.9 % IV SOLN: INTRAVENOUS | Status: DC

## 2022-01-09 MED ORDER — SODIUM CHLORIDE 0.9% FLUSH: 3.0000 mL | Freq: Two times a day (BID) | INTRAVENOUS | Status: DC

## 2022-01-09 MED ORDER — HEPARIN (PORCINE) IN NACL 1000-0.9 UT/500ML-% IV SOLN
INTRAVENOUS | Status: DC | PRN
  Administered 2022-01-09: 500 mL

## 2022-01-09 MED ORDER — PHENYLEPHRINE HCL (PRESSORS) 10 MG/ML IV SOLN
INTRAVENOUS | Status: DC | PRN
  Administered 2022-01-09 (×2): 80 ug via INTRAVENOUS

## 2022-01-09 MED ORDER — CHLORHEXIDINE GLUCONATE 0.12 % MT SOLN
15.0000 mL | Freq: Once | OROMUCOSAL | Status: AC
  Administered 2022-01-09: 15 mL via OROMUCOSAL
  Filled 2022-01-09: qty 15

## 2022-01-09 MED ORDER — HEPARIN SODIUM (PORCINE) 1000 UNIT/ML IJ SOLN
INTRAMUSCULAR | Status: AC
  Filled 2022-01-09: qty 10

## 2022-01-09 MED ORDER — LIDOCAINE 2% (20 MG/ML) 5 ML SYRINGE
INTRAMUSCULAR | Status: DC | PRN
Start: 2022-01-09 — End: 2022-01-09
  Administered 2022-01-09: 50 mg via INTRAVENOUS

## 2022-01-09 MED ORDER — ROCURONIUM BROMIDE 10 MG/ML (PF) SYRINGE
PREFILLED_SYRINGE | INTRAVENOUS | Status: DC | PRN
  Administered 2022-01-09: 60 mg via INTRAVENOUS

## 2022-01-09 MED ORDER — ACETAMINOPHEN 325 MG PO TABS: 650.0000 mg | ORAL_TABLET | ORAL | Status: DC | PRN

## 2022-01-09 MED ORDER — PHENYLEPHRINE HCL-NACL 20-0.9 MG/250ML-% IV SOLN
INTRAVENOUS | Status: DC | PRN
  Administered 2022-01-09: 25 ug/min via INTRAVENOUS

## 2022-01-09 MED ORDER — EPHEDRINE SULFATE (PRESSORS) 50 MG/ML IJ SOLN
INTRAMUSCULAR | Status: DC | PRN
  Administered 2022-01-09: 5 mg via INTRAVENOUS
  Administered 2022-01-09: 2.5 mg via INTRAVENOUS

## 2022-01-09 MED ORDER — SODIUM CHLORIDE 0.9% FLUSH: 3.0000 mL | INTRAVENOUS | Status: DC | PRN

## 2022-01-09 MED ORDER — PHENYLEPHRINE HCL-NACL 20-0.9 MG/250ML-% IV SOLN: INTRAVENOUS | Status: DC | PRN

## 2022-01-09 MED ORDER — HEPARIN SODIUM (PORCINE) 1000 UNIT/ML IJ SOLN
INTRAMUSCULAR | Status: DC | PRN
  Administered 2022-01-09: 2000 [IU] via INTRAVENOUS

## 2022-01-09 MED ORDER — FENTANYL CITRATE (PF) 250 MCG/5ML IJ SOLN
INTRAMUSCULAR | Status: DC | PRN
  Administered 2022-01-09: 100 ug via INTRAVENOUS

## 2022-01-09 MED ORDER — HEPARIN SODIUM (PORCINE) 1000 UNIT/ML IJ SOLN
INTRAMUSCULAR | Status: DC | PRN
  Administered 2022-01-09: 10000 [IU] via INTRAVENOUS

## 2022-01-09 MED ORDER — AMLODIPINE BESYLATE 5 MG PO TABS
5.0000 mg | ORAL_TABLET | Freq: Every day | ORAL | Status: DC
  Administered 2022-01-09: 5 mg via ORAL
  Filled 2022-01-09: qty 1

## 2022-01-09 MED ORDER — ACETAMINOPHEN 500 MG PO TABS
500.0000 mg | ORAL_TABLET | Freq: Four times a day (QID) | ORAL | Status: DC | PRN
Start: 2022-01-09 — End: 2022-01-09

## 2022-01-09 SURGICAL SUPPLY — 19 items
BLANKET WARM UNDERBOD FULL ACC (MISCELLANEOUS) ×3 IMPLANT
CATH INFINITI 5FR ANG PIGTAIL (CATHETERS) ×1 IMPLANT
CLOSURE PERCLOSE PROSTYLE (VASCULAR PRODUCTS) ×2 IMPLANT
DEVICE WATCHMAN FLX PROC (KITS) IMPLANT
DILATOR VESSEL 38 20CM 12FR (INTRODUCER) ×1 IMPLANT
KIT HEART LEFT (KITS) ×3 IMPLANT
KIT SHEA VERSACROSS LAAC CONNE (KITS) ×1 IMPLANT
PACK CARDIAC CATHETERIZATION (CUSTOM PROCEDURE TRAY) ×3 IMPLANT
PAD DEFIB RADIO PHYSIO CONN (PAD) ×3 IMPLANT
SHEATH PERFORMER 16FR 30 (SHEATH) ×1 IMPLANT
SHEATH PINNACLE 8F 10CM (SHEATH) ×1 IMPLANT
SHEATH PROBE COVER 6X72 (BAG) ×3 IMPLANT
SYS WATCHMAN FXD DBL (SHEATH) ×2
SYSTEM WATCHMAN FXD DBL (SHEATH) IMPLANT
TRANSDUCER W/STOPCOCK (MISCELLANEOUS) ×3 IMPLANT
TUBING CIL FLEX 10 FLL-RA (TUBING) ×3 IMPLANT
WATCHMAN FLX 27 (Prosthesis & Implant Heart) ×1 IMPLANT
WATCHMAN FLX PROCEDURE DEVICE (KITS) ×2 IMPLANT
WATCHMAN PROCED TRUSEAL ACCESS (SHEATH) ×1 IMPLANT

## 2022-01-09 NOTE — Anesthesia Preprocedure Evaluation (Signed)
Anesthesia Evaluation  Patient identified by MRN, date of birth, ID band Patient awake    Reviewed: Allergy & Precautions, H&P , NPO status , Patient's Chart, lab work & pertinent test results  Airway Mallampati: II   Neck ROM: full    Dental   Pulmonary former smoker,    breath sounds clear to auscultation       Cardiovascular hypertension, + CAD  + dysrhythmias Atrial Fibrillation  Rhythm:regular Rate:Normal     Neuro/Psych TIA   GI/Hepatic   Endo/Other    Renal/GU      Musculoskeletal  (+) Arthritis ,   Abdominal   Peds  Hematology   Anesthesia Other Findings   Reproductive/Obstetrics                             Anesthesia Physical Anesthesia Plan  ASA: 3  Anesthesia Plan: General   Post-op Pain Management:    Induction: Intravenous  PONV Risk Score and Plan: 2 and Ondansetron, Dexamethasone and Treatment may vary due to age or medical condition  Airway Management Planned: Oral ETT  Additional Equipment: ClearSight and TEE  Intra-op Plan:   Post-operative Plan: Extubation in OR  Informed Consent: I have reviewed the patients History and Physical, chart, labs and discussed the procedure including the risks, benefits and alternatives for the proposed anesthesia with the patient or authorized representative who has indicated his/her understanding and acceptance.     Dental advisory given  Plan Discussed with: CRNA, Anesthesiologist and Surgeon  Anesthesia Plan Comments:         Anesthesia Quick Evaluation

## 2022-01-09 NOTE — H&P (Signed)
Electrophysiology Office Note:     Date:  01/09/2022    ID:  Edward [name], DOB 1940/09/21, MRN [id number]   PCP:  Prince Solian, MD         [facility] Cardiologist:  Peter Martinique, MD    Referring MD: Edward Lance, MD    Chief Complaint: Watchman consult   History of Present Illness:     Edward [name] is a 81 y.o. male who presents for an evaluation of watchman consult at the request of Edward [name]. Their medical history includes hypertension, hyperlipidemia, stroke, coronary artery disease.  The patient last all Edward [name] August 14, 2021.  At that appointment he reported being interested in [place] given his strong desire to avoid long-term exposure anticoagulation.  At that appointment a loop recorder was also removed.   He presents today for a Watchman implant.      Objective        Past Medical History:  Diagnosis Date   Arthritis      right hand,  left foot   At risk for sleep apnea      STOP-BANG = 4    SENT TO PCP 02-10-215   BPH (benign prostatic hypertrophy)     Coronary artery disease CARIOLOGIST-- DR Wynonia Lawman    ANGIOPLASTY TO LAD   COVID 2022    mild case   Dysrhythmia      A-fib   Elevated PSA     Frequency of urination     History of kidney stones     Hyperlipidemia     Hypertension     Nocturia     Spermatocele      left   Stroke [facility])      TIA's   Urgency of urination     Vertigo             Past Surgical History:  Procedure Laterality Date   CORONARY ANGIOPLASTY   1995  DR [name]    LAD   CYSTOSCOPY N/A 09/18/2015    Procedure: CYSTOSCOPY;  Surgeon: Festus Aloe, MD;  Location: [facility];  Service: Urology;  Laterality: N/A;   CYSTOSCOPY WITH RETROGRADE PYELOGRAM, URETEROSCOPY AND STENT PLACEMENT Left 03/01/2013    Procedure: CYSTOSCOPY WITH LEFT RETROGRADE PYELOGRAM, LEFT URETEROSCOPY and stent PLACEMENT;  Surgeon: Fredricka Bonine, MD;  Location: [facility];  Service: Urology;  Laterality: Left;   FOOT  FUSION Left      SECONDARY TO FX'S   GREEN LIGHT LASER TURP (TRANSURETHRAL RESECTION OF PROSTATE N/A 10/04/2013    Procedure: GREEN LIGHT LASER TURP (TRANSURETHRAL RESECTION OF PROSTATE;  Surgeon: Fredricka Bonine, MD;  Location: [facility];  Service: Urology;  Laterality: N/A;   INGUINAL HERNIA REPAIR Bilateral     LOOP RECORDER INSERTION N/A 04/20/2018    Procedure: LOOP RECORDER INSERTION;  Surgeon: Edward Lance, MD;  Location: [place] [facility];  Service: Cardiovascular;  Laterality: N/A;   SHOULDER ARTHROSCOPY Right 2004   SHOULDER ARTHROSCOPY WITH DEBRIDEMENT AND BICEP TENDON REPAIR Left 04-28-2011   SPERMATOCELECTOMY Left 10/04/2013    Procedure: LEFT SPERMATOCELECTOMY;  Surgeon: Fredricka Bonine, MD;  Location: [facility];  Service: Urology;  Laterality: Left;   TEE WITHOUT CARDIOVERSION N/A 02/16/2018    Procedure: TRANSESOPHAGEAL ECHOCARDIOGRAM (TEE);  Surgeon: Pixie Casino, MD;  Location: [place];  Service: Cardiovascular;  Laterality: N/A;   TRANSURETHRAL RESECTION OF PROSTATE N/A 12/11/2014    Procedure: CYSTOSCOPY, CLOT EVACUATION, WITH FULGURATION ;  Surgeon:  Irine Seal, MD;  Location: [facility];  Service: Urology;  Laterality: N/A;   TRANSURETHRAL RESECTION OF PROSTATE N/A 09/18/2015    Procedure: TRANSURETHRAL RESECTION OF THE PROSTATE (TURP);  Surgeon: Festus Aloe, MD;  Location: [facility];  Service: Urology;  Laterality: N/A;   UMBILICAL HERNIA REPAIR   [id number]   UMBILICAL HERNIA REPAIR N/A 11/12/2021    Procedure: UMBILICAL HERNIA REPAIR;  Surgeon: Coralie Keens, MD;  Location: [place];  Service: General;  Laterality: N/A;  LMA      Current Medications: Active Medications      Current Meds  Medication Sig   acetaminophen (TYLENOL) 500 MG tablet Take 500 mg by mouth every 6 (six) hours as needed for moderate pain or mild pain.   amLODipine (NORVASC) 5 MG tablet TAKE 1 TABLET BY MOUTH  DAILY (Patient taking differently: Take 5 mg by mouth daily in the  afternoon. 1300-1400)   apixaban (ELIQUIS) 5 MG TABS tablet Take 1 tablet (5 mg total) by mouth 2 (two) times daily. (Patient taking differently: Take 5 mg by mouth 2 (two) times daily. 0700 & 1900)   Artificial Tear Ointment (DRY EYES OP) Place 1 drop into both eyes daily as needed (Dry or red eye).   budesonide-formoterol (SYMBICORT) 160-4.5 MCG/ACT inhaler Inhale 1 puff into the lungs 2 (two) times daily as needed (for shortness of breath).   Coenzyme Q10 (CO Q 10) 100 MG CAPS Take 100 mg by mouth daily at 12 noon. 1300-1400   losartan (COZAAR) 100 MG tablet TAKE 1 TABLET BY MOUTH  DAILY (Patient taking differently: Take 100 mg by mouth every evening. 1900)   rosuvastatin (CRESTOR) 20 MG tablet TAKE 1 TABLET BY MOUTH IN  THE EVENING (Patient taking differently: 1900)   tamsulosin (FLOMAX) 0.4 MG CAPS capsule Take 0.4 mg by mouth at bedtime. 2000   traMADol (ULTRAM) 50 MG tablet Take 1 tablet (50 mg total) by mouth every 6 (six) hours as needed for moderate pain or severe pain.        Allergies:   Amoxicillin-pot clavulanate, Codeine, Levofloxacin, and Zocor [simvastatin]    Social History         Socioeconomic History   Marital status: Married      Spouse name: Not on file   Number of children: 0   Years of education: Not on file   Highest education level: Not on file  Occupational History   Occupation: Acupuncturist      Comment: retired  Tobacco Use   Smoking status: Former      Packs/day: 0.50      Years: 3.00      Pack years: 1.50      Types: Cigarettes      Quit date: 12/18/1970      Years since quitting: 51.0   Smokeless tobacco: Never  Vaping Use   Vaping Use: Never used  Substance and Sexual Activity   Alcohol use: Yes      Comment: OCCASIONAL   Drug use: No   Sexual activity: Not on file  Other Topics Concern   Not on file  Social History Narrative   Not on file    Social Determinants of Health    Financial Resource Strain: Not on file  Food  Insecurity: Not on file  Transportation Needs: Not on file  Physical Activity: Not on file  Stress: Not on file  Social Connections: Not on file      Family History: The patient's family history  is not on file.   ROS:   Please see the history of present illness.    All other systems reviewed and are negative.   EKGs/Labs/Other Studies Reviewed:     The following studies were reviewed today:   February 12, 2021 Loop recorder interrogation showed less than 0.1% A-fib burden.         Recent Labs: 11/12/2021: BUN 10; Creatinine, Ser 1.05; Hemoglobin 13.3; Platelets 150; Potassium 3.7; Sodium 139  Recent Lipid Panel Labs (Brief)          Component Value Date/Time    CHOL 117 01/15/2018 0453    TRIG 64 01/15/2018 0453    HDL 41 01/15/2018 0453    CHOLHDL 2.9 01/15/2018 0453    VLDL 13 01/15/2018 0453    LDLCALC 63 01/15/2018 0453        Physical Exam:     VS:  BP 153/67   Pulse 71   Ht 5\' 6"  (1.676 m)   Wt 150 lb (68 kg)   SpO2 96%   BMI 24.21 kg/m         Wt Readings from Last 3 Encounters:  12/06/21 150 lb (68 kg)  11/12/21 150 lb (68 kg)  08/14/21 154 lb 6.4 oz (70 kg)      GEN:  Well nourished, well developed in no acute distress HEENT: Normal NECK: No JVD; No carotid bruits LYMPHATICS: No lymphadenopathy CARDIAC: RRR, no murmurs, rubs, gallops RESPIRATORY:  Clear to auscultation without rales, wheezing or rhonchi  ABDOMEN: Soft, non-tender, non-distended MUSCULOSKELETAL:  No edema; No deformity  SKIN: Warm and dry NEUROLOGIC:  Alert and oriented x 3 PSYCHIATRIC:  Normal affect          Assessment     ASSESSMENT:     1. Paroxysmal atrial fibrillation (HCC)   2. TIA (transient ischemic attack)   3. Coronary artery disease involving native coronary artery of native heart without angina pectoris   4. Primary hypertension     PLAN:     In order of problems listed above:   #Paroxysmal atrial fibrillation #TIA #Coronary artery  disease #Hypertension The patient presents for watchman consideration.  He has a strong desire to avoid long-term exposure anticoagulation which I think is reasonable.  I discussed the Watchman procedure in detail include the risks and recovery.  He is planning on discussing the Watchman procedure with Edward [name] before making a final decision which I think is very reasonable.  He will let us know if he would like to proceed.  I have seen Edward [name] in the office today who is being considered for a Watchman left atrial appendage closure device. I believe they will benefit from this procedure given their history of atrial fibrillation, CHA2DS2-VASc score of 6 and unadjusted ischemic stroke rate of 9.7% per year. The patient's chart has been reviewed and I feel that they would be a candidate for short term oral anticoagulation after Watchman implant.    It is my belief that after undergoing a LAA closure procedure, Edward [name] will not need long term anticoagulation which eliminates anticoagulation side effects and major bleeding risk.    Procedural risks for the Watchman implant have been reviewed with the patient including a 0.5% risk of stroke, <1% risk of perforation and <1% risk of device embolization.      The published clinical data on the safety and effectiveness of WATCHMAN include but are not limited to the following: - Vertell Limber DR, Mechele Claude, Sick  P et al. for the PROTECT AF Investigators. Percutaneous closure of the left atrial appendage versus warfarin therapy for prevention of stroke in patients with atrial fibrillation: a randomised non-inferiority trial. Lancet 2009; 374: 534-42. Mechele Claude, Doshi SK, Abelardo Diesel D et al. on behalf of the PROTECT AF Investigators. Percutaneous Left Atrial Appendage Closure for Stroke Prophylaxis in Patients With Atrial Fibrillation 2.3-Year Follow-up of the PROTECT AF (Watchman Left Atrial Appendage System for Embolic Protection in Patients  With Atrial Fibrillation) Trial. Circulation 2013; 127:720-729. - Alli O, Doshi S,  Kar S, Reddy VY, Sievert H et al. Quality of Life Assessment in the Randomized PROTECT AF (Percutaneous Closure of the Left Atrial Appendage Versus Warfarin Therapy for Prevention of Stroke in Patients With Atrial Fibrillation) Trial of Patients at Risk for Stroke With Nonvalvular Atrial Fibrillation. J Am Coll Cardiol 2013; N8865744. Vertell Limber DR, Tarri Abernethy, Price M, [place], Sievert H, Doshi S, Huber K, Reddy V. Prospective randomized evaluation of the Watchman left atrial appendage Device in patients with atrial fibrillation versus long-term warfarin therapy; the PREVAIL trial. Journal of the [employer] of Cardiology, Vol. 4, No. 1, 2014, 1-11. - Kar S, Doshi SK, Sadhu A, Horton R, Osorio J et al. Primary outcome evaluation of a next-generation left atrial appendage closure device: results from the PINNACLE FLX trial. Circulation 2021;143(18)1754-1762.         HAS-BLED score 3 Hypertension Yes  Abnormal renal and liver function (Dialysis, transplant, Cr >2.26 mg/dL /Cirrhosis or Bilirubin >2x Normal or AST/ALT/AP >3x Normal) No  Stroke Yes  Bleeding No  Labile INR (Unstable/high INR) No  Elderly (>65) Yes  Drugs or alcohol (? 8 drinks/week, anti-plt or NSAID) No    CHA2DS2-VASc Score = 6  The patient's score is based upon: CHF History: 0 HTN History: 1 Diabetes History: 0 Stroke History: 2 Vascular Disease History: 1 Age Score: 2 Gender Score: 0   Edward [name] presents today for Watchman implant. Procedural details have been reviewed as detailed above and he wishes to proceed.   Signed, Edward [name]. Quentin Ore, MD, [facility], [facility] 01/09/2022 Electrophysiology [facility]

## 2022-01-09 NOTE — Discharge Summary (Signed)
[facility]    Patient ID: Edward [name],  MRN: [id number], DOB/AGE: [birth year] 79 y.o.  Admit date: 01/09/2022 Discharge date: 01/09/2022  Primary Care Physician: Prince Solian, MD  Primary Cardiologist: Peter Martinique, MD  Electrophysiologist: Dr. Lovena Le, MD/ Dr. Quentin Ore, MD [facility])  Primary Discharge Diagnosis:  Paroxysmal Atrial Fibrillation Poor candidacy for long term anticoagulation due to h/o intracranial hemorrhage  Secondary Discharge Diagnosis:  -Cryptogenic CVA -CAD s/p angioplasty -HLD -HTN  Procedures This Admission:  CONCLUSIONS:  1.Successful implantation of a WATCHMAN FLX 63mm left atrial appendage occlusive device    2. TEE demonstrating no LAA thrombus 3. No early apparent complications.    Post Implant Anticoagulation Strategy: Continue Eliquis post implant. Stop Eliquis 45 days after implant and transition to Plavix 75mg  PO daily. CT scan imaging planned 60 days post implant.   Brief HPI: Edward [name] is a 81 y.o. male with a history of cryogenic stroke s/p ILR found to have paroxysmal atrial fibrillation who is currently taking Eliquis. Also a hx of CAD s/p angioplasty of the LAD 1995, HLD, and HTN who was referred to Dr. Quentin Ore 12/06/21 for the evaluation of possible Watchman.    Edward [name] is followed by Dr. Martinique for his cardiology care. He was noted to have had a TIA in 2018 with recurrent episode 12/2017. His evaluation included an echocardiogram, carotid ultrasound, CT and MRI studies of the brain. All of these were unrevealing. There was no evidence of cortical infarction on his neuroimaging studies.  He had no arrhythmia documented. His TEE demonstrated a small PFO and the patient was seen by Dr. Burt Knack for consideration of PFO closure however after discussion with Neurology, this was not felt to be indicated. He subsequently had an implantable loop recorder placed by Dr Lovena Le which showed new atrial fibrillation and he was  therefore started on Eliquis.    He has a history of CAD with remote angioplasty of the LAD in 1995. He has never had recurrent angina.    Edward [name] last saw Dr. Lovena Le 08/14/2021 for atrial fibrillation and IRL follow up and showed interest in Watchman implant to avoid long term exposure to anticoagulation. IRL was nearing end of life and the patient wished to have this removed.    On Dr. Mardene Speak evaluation, plan was to proceed with Watchman workup with CT imagining that showed anatomy suitable for implant.    Hospital Course:  The patient was admitted and underwent left atrial appendage occlusive device placement with Watchman FLX 52mm. He was monitored on telemetry after his procedure which demonstrated NSR. Groin site has been without complication. The patient was examined and considered to be stable for same day discharge.  Wound care and restrictions were reviewed with the patient. The patient has been scheduled for post procedure follow up with Kathyrn Drown, NP. Medication plan will be to continue Eliquis 5mg  BID post implant. Stop Eliquis 45 days after implant (02/20/2022) and transition to Plavix 75mg  PO daily. Post Watchman CT scan imaging planned 60 days post implant. He will be scheduled for follow up with myself in approximately 3-4 weeks. He will need SBE for 6 months post implant. Given Amoxicillin allergy, will RX Azithromycin.   Paroxsymal atrial fibrillation: Followed by Dr. Lovena Le for atrial fibrillation found on IRL after cryptogenic stroke in 2019 who voiced interest in pursuing Watchman implant to avoid long term exposure to [facility]. Watchman imaging with anatomy suitable for implant. He underwent Watchman procedure 5/25 as  above.Medication plan will be to continue Eliquis 5mg  BID post implant. Stop Eliquis 45 days after implant (02/20/2022) and transition to Plavix 75mg  PO daily. Post Watchman CT scan imaging planned 60 days post implant. He will be scheduled for follow up with myself in  approximately 3-4 weeks. He will need SBE for 6 months post implant. Given Amoxicillin allergy, will RX Azithromycin.    Hx of CVA: No deficits. Continue current regimen.    CAD: s/p LAD angioplasty 1995 with no anginal symptoms. Continue statin. No ASA due to [facility]. No beta blocker given asymptomatic bradycardia.    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 Noted to have moderate AR on last echocardiogram from 12/16/21 with two separate jets. Continue to follow with surveillance imaging.    HTN: Stable, no medication changes.   HLD: Last LDL, 63 on 01/15/18. Continue Crestor.   Physical Exam: Vitals:   01/09/22 1300 01/09/22 1315 01/09/22 1330 01/09/22 1431  BP: 132/62   (!) 123/55  Pulse: 84 74 78 80  Resp:      Temp:      TempSrc:      SpO2: 97% 98% 96% 97%  Weight:      Height:       General: Well developed, well nourished, NAD Lungs:Clear to ausculation bilaterally. No wheezes, rales, or rhonchi. Breathing is unlabored. Cardiovascular: RRR with S1 S2. No murmurs Extremities: No edema. Groin site with no bleeding or hematoma  Neuro: Alert and oriented. No focal deficits. No facial asymmetry. MAE spontaneously. Psych: Responds to questions appropriately with normal affect.    Labs:   Lab Results  Component Value Date   WBC 6.1 01/06/2022   HGB 12.4 (L) 01/06/2022   HCT 36.8 (L) 01/06/2022   MCV 92 01/06/2022   PLT 172 01/06/2022    Recent Labs  Lab 01/06/22 1132  NA 137  K 4.3  CL 104  CO2 24  BUN 14  CREATININE 0.90  CALCIUM 9.5  GLUCOSE 94    Discharge Medications:  Allergies as of 01/09/2022       Reactions   Augmentin [amoxicillin-pot Clavulanate] Diarrhea   Codeine Nausea Only   Levaquin [levofloxacin]    Other reaction(s): severe stomach pain   Zocor [simvastatin]    Other reaction(s): myalgia        Medication List     TAKE these medications    acetaminophen 500 MG tablet Commonly known as: TYLENOL Take 500 mg by mouth every 6 (six) hours as  needed for moderate pain or mild pain.   amLODipine 5 MG tablet Commonly known as: NORVASC TAKE 1 TABLET BY MOUTH  DAILY What changed: when to take this   apixaban 5 MG Tabs tablet Commonly known as: Eliquis Take 1 tablet (5 mg total) by mouth 2 (two) times daily. Notes to patient: Restart Eliquis tonight, 01/09/22   budesonide-formoterol 160-4.5 MCG/ACT inhaler Commonly known as: SYMBICORT Inhale 1 puff into the lungs 2 (two) times daily as needed (chest tightness).   Co Q 10 100 MG Caps Take 100 mg by mouth daily at 12 noon.   losartan 100 MG tablet Commonly known as: COZAAR TAKE 1 TABLET BY MOUTH  DAILY What changed: when to take this   MAGNESIUM CITRATE PO Take 250 mg by mouth every other day.   multivitamin with minerals Tabs tablet Take 1 tablet by mouth every other day.   PROBIOTIC PO Take 1 capsule by mouth in the morning. In the morning   rosuvastatin  20 MG tablet Commonly known as: CRESTOR TAKE 1 TABLET BY MOUTH IN  THE EVENING   tamsulosin 0.4 MG Caps capsule Commonly known as: FLOMAX Take 0.4 mg by mouth at bedtime.   traMADol 50 MG tablet Commonly known as: ULTRAM Take 1 tablet (50 mg total) by mouth every 6 (six) hours as needed for moderate pain or severe pain.   VITAMIN B-12 PO Take 1 tablet by mouth every other day. Opposite the days of the multivitamin   VITAMIN D3 PO Take by mouth.   VITAMIN E PO Take by mouth. Opposite the days of the zinc, vitamin e, vitamin b-12, vitamin d3   ZINC PO Take by mouth.        Disposition:  Home  Discharge Instructions     Call MD for:  difficulty breathing, headache or visual disturbances   Complete by: As directed    Call MD for:  extreme fatigue   Complete by: As directed    Call MD for:  hives   Complete by: As directed    Call MD for:  persistant dizziness or light-headedness   Complete by: As directed    Call MD for:  persistant nausea and vomiting   Complete by: As directed    Call MD  for:  redness, tenderness, or signs of infection (pain, swelling, redness, odor or green/yellow discharge around incision site)   Complete by: As directed    Call MD for:  severe uncontrolled pain   Complete by: As directed    Call MD for:  temperature >100.4   Complete by: As directed    Diet - low sodium heart healthy   Complete by: As directed    Discharge instructions   Complete by: As directed    [facility] Procedure, Care After  Procedure MD: Dr. Benson Norway Clinical Coordinator: Lenice Llamas, RN  This sheet gives you information about how to care for yourself after your procedure. Your health care provider may also give you more specific instructions. If you have problems or questions, contact your health care provider.  What can I expect after the procedure? After the procedure, it is common to have: Bruising around your puncture site. Tenderness around your puncture site. Tiredness (fatigue).  Medication instructions It is very important to continue to take your blood thinner as directed by your doctor after the Watchman procedure. Call your procedure doctor's office with question or concerns. If you are on Coumadin (warfarin), you will have your INR checked the week after your procedure, with a goal INR of 2.0 - 3.0. Please follow your medication instructions on your discharge summary. Only take the medications listed on your discharge paperwork.  Follow up You will be seen in approximately 1 month after your procedure  You will have post procedure imaging around 8 weeks after your procedure mark to check your device You will follow up the MD/APP who performed your procedure 6 months after your procedure The Watchman Clinical Coordinator will check in with you from time to time, including 1 and 2 years after your procedure.    Follow these instructions at home: Puncture site care  Follow instructions from your health care provider about how to take care of your  puncture site. Make sure you: If present, leave stitches (sutures), skin glue, or adhesive strips in place.  If a large square bandage is present, this may be removed 24 hours after surgery.  Check your puncture site every day for signs of infection. Check for:  Redness, swelling, or pain. Fluid or blood. If your puncture site starts to bleed, lie down on your back, apply firm pressure to the area, and contact your health care provider. Warmth. Pus or a bad smell. Driving Do not drive yourself home if you received sedation Do not drive for at least 4 days after your procedure or however long your health care provider recommends. (Do not resume driving if you have previously been instructed not to drive for other health reasons.) Do not spend greater than 1 hour at a time in a car for the first 3 days. Stop and take a break with a 5 minute walk at least every hour.  Do not drive or use heavy machinery while taking prescription pain medicine.  Activity Avoid activities that take a lot of effort, including exercise, for at least 7 days after your procedure. For the first 3 days, avoid sitting for longer than one hour at a time.  Avoid alcoholic beverages, signing paperwork, or participating in legal proceedings for 24 hours after receiving sedation Do not lift anything that is heavier than 10 lb (4.5 kg) for one week.  No sexual activity for 1 week.  Return to your normal activities as told by your health care provider. Ask your health care provider what activities are safe for you. General instructions Take over-the-counter and prescription medicines only as told by your health care provider. Do not use any products that contain nicotine or tobacco, such as cigarettes and e-cigarettes. If you need help quitting, ask your health care provider. You may shower after 24 hours, but Do not take baths, swim, or use a hot tub for 1 week.  Do not drink alcohol for 24 hours after your procedure. Keep  all follow-up visits as told by your health care provider. This is important. Dental Work: You will require antibiotics prior to any dental work, including cleanings, for 6 months after your Watchman implantation to help protect you from infection. After 6 months, antibiotics are no longer required. Contact a health care provider if: You have redness, mild swelling, or pain around your puncture site. You have soreness in your throat or at your puncture site that does not improve after several days You have fluid or blood coming from your puncture site that stops after applying firm pressure to the area. Your puncture site feels warm to the touch. You have pus or a bad smell coming from your puncture site. You have a fever. You have chest pain or discomfort that spreads to your neck, jaw, or arm. You are sweating a lot. You feel nauseous. You have a fast or irregular heartbeat. You have shortness of breath. You are dizzy or light-headed and feel the need to lie down. You have pain or numbness in the arm or leg closest to your puncture site. Get help right away if: Your puncture site suddenly swells. Your puncture site is bleeding and the bleeding does not stop after applying firm pressure to the area. These symptoms may represent a serious problem that is an emergency. Do not wait to see if the symptoms will go away. Get medical help right away. Call your local emergency services (911 in the [place] Do not drive yourself to the hospital. Summary After the procedure, it is normal to have bruising and tenderness at the puncture site in your groin, neck, or forearm. Check your puncture site every day for signs of infection. Get help right away if your puncture site is bleeding and the  bleeding does not stop after applying firm pressure to the area. This is a medical emergency.  This information is not intended to replace advice given to you by your health care provider. Make sure you discuss any  questions you have with your health care provider.   Increase activity slowly   Complete by: As directed        Follow-up Information     Martinique, Peter M, MD Follow up on 02/12/2022.   Specialty: Cardiology Why: at 9:40am. Please arrive at 9:20am. Contact information: [address] [place] [address] [phone]                Duration of Discharge Encounter: Greater than 30 minutes including physician time.  Signed, Kathyrn Drown, NP  01/09/2022 3:55 PM

## 2022-01-09 NOTE — Progress Notes (Signed)
  [facility]    Patient doing well s/p Watchman implant. He is hemodynamically stable. Groin site is stable. Plan for early ambulation after bedrest completed and discharge later today.   Kathyrn Drown NP-C Structural Heart Team  Pager: [phone] Phone: [phone]

## 2022-01-09 NOTE — TOC Initial Note (Signed)
Transition of Care [facility]) - Initial/Assessment Note    Patient Details  Name: EVERRETT DAVIDOVICH MRN: [id number] Date of Birth: 30-Jan-1941  Transition of Care [facility]) CM/SW Contact:    Angelita Ingles, RN Phone Number:[phone]  01/09/2022, 3:55 PM  Clinical Narrative:                  Transition of Care [facility]) Screening Note   Patient Details  Name: FOUNTAIN LICEAGA Date of Birth: 1941-05-01   Transition of Care [facility]) CM/SW Contact:    Angelita Ingles, RN Phone Number: 01/09/2022, 3:55 PM    Transition of Care Department [facility]) has reviewed patient and no TOC needs have been identified at this time. We will continue to monitor patient advancement through interdisciplinary progression rounds. If new patient transition needs arise, please place a TOC consult.          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) Does the patient have difficulty seeing, even when wearing glasses/contacts?: Yes Does the patient have difficulty concentrating, remembering, or making decisions?: No Patient able to express need for assistance with ADLs?: Yes Does the patient have difficulty dressing or bathing?: No Independently performs ADLs?: Yes (appropriate for developmental age) Does the patient have difficulty walking or climbing stairs?: No Weakness of Legs: None Weakness of Arms/Hands: None  Permission Sought/Granted                  Emotional Assessment              Admission diagnosis:  Atrial fibrillation [facility]) [I48.91] Patient Active  Problem List   Diagnosis Date Noted   Presence of Watchman left atrial appendage closure device 01/09/2022   Atrial fibrillation ([place]) 01/09/2022   Paroxysmal atrial fibrillation ([place]) 08/14/2021   TIA (transient ischemic attack) 10/09/2016   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 [place] - [address] [place] [place] [address] Phone: [phone] Fax: [phone]     Social Determinants of Health (SDOH) Interventions    Readmission Risk Interventions     View : No data to display.

## 2022-01-09 NOTE — Anesthesia Procedure Notes (Signed)
Procedure Name: Intubation Date/Time: 01/09/2022 9:52 AM Performed by: Glynda Jaeger, CRNA Pre-anesthesia Checklist: Patient identified, Patient being monitored, Timeout performed, Emergency Drugs available and Suction available Patient Re-evaluated:Patient Re-evaluated prior to induction Oxygen Delivery Method: Circle System Utilized Preoxygenation: Pre-oxygenation with 100% oxygen Induction Type: IV induction Ventilation: Mask ventilation without difficulty Laryngoscope Size: Mac and 4 Grade View: Grade I Tube type: Oral Tube size: 7.5 mm Number of attempts: 1 Airway Equipment and Method: Stylet Placement Confirmation: ETT inserted through vocal cords under direct vision, positive ETCO2 and breath sounds checked- equal and bilateral Secured at: 22 cm Tube secured with: Tape Dental Injury: Teeth and Oropharynx as per pre-operative assessment

## 2022-01-09 NOTE — Transfer of Care (Signed)
Immediate Anesthesia Transfer of Care Note  Patient: Edward [name]  Procedure(s) Performed: LEFT ATRIAL APPENDAGE OCCLUSION TRANSESOPHAGEAL ECHOCARDIOGRAM (TEE)  Patient Location: PACU  Anesthesia Type:General  Level of Consciousness: drowsy  Airway & Oxygen Therapy: Patient Spontanous Breathing and Patient connected to nasal cannula oxygen  Post-op Assessment: Report given to RN and Post -op Vital signs reviewed and stable  Post vital signs: Reviewed and stable  Last Vitals:  Vitals Value Taken Time  BP 133/58 01/09/22 1113  Temp    Pulse 74 01/09/22 1113  Resp 20 01/09/22 1113  SpO2 100 % 01/09/22 1113  Vitals shown include unvalidated device data.  Last Pain:  Vitals:   01/09/22 0555  TempSrc:   PainSc: 0-No pain         Complications: No notable events documented.

## 2022-01-10 ENCOUNTER — Telehealth: Payer: Self-pay | Admitting: Cardiology

## 2022-01-10 ENCOUNTER — Telehealth: Payer: Self-pay

## 2022-01-10 NOTE — Telephone Encounter (Signed)
Patient called in regards to new blood in his urine this afternoon. He underwent Watchman implant yesterday and was discharged home without complication. He reports urinating several times throughout the day with no evidence of bleeding. Most recently, he noticed bright red blood in the toilet. He has no urethral or back pain with this. Groin site is stable per his report. He states he has had similar symptoms in the past however this was generally associated with kidney stones. He has been on Eliquis for many years with no issues and no new medications were added after Watchman implant. I have asked him to monitor his symptoms for worsening. My hope is that he may have had a small ureteral clot that passed and he will have resolution of bleeding. If bleeding worsens, he will need to call our on call team through the [facility] office over the weekend/holiday.    Kathyrn Drown NP-C Structural Heart Team  Pager: [phone] Phone: [phone]

## 2022-01-10 NOTE — Telephone Encounter (Signed)
  [facility] Team  Contacted the patient regarding discharge from [facility] on 01/09/2022   The patient understands to follow up with Dr. Martinique on 02/12/2022 in preparation for CT on 03/10/2022.  The patient understands discharge instructions? Yes  The patient understands medications and regimen? Yes   The patient reports he has not looked at his groins site yet, but there is no visible bleeding/signs of infection/pain. He will remove the dressing when he showers later tonight.  The patient understands to call with any questions or concerns prior to scheduled visit.

## 2022-01-10 NOTE — Anesthesia Postprocedure Evaluation (Signed)
Anesthesia Post Note  Patient: Edward [name]  Procedure(s) Performed: LEFT ATRIAL APPENDAGE OCCLUSION TRANSESOPHAGEAL ECHOCARDIOGRAM (TEE)     Patient location during evaluation: PACU Anesthesia Type: General Level of consciousness: awake and alert Pain management: pain level controlled Vital Signs Assessment: post-procedure vital signs reviewed and stable Respiratory status: spontaneous breathing, nonlabored ventilation, respiratory function stable and patient connected to nasal cannula oxygen Cardiovascular status: blood pressure returned to baseline and stable Postop Assessment: no apparent nausea or vomiting Anesthetic complications: no   No notable events documented.  Last Vitals:  Vitals:   01/09/22 1600 01/09/22 1613  BP: 128/65 (!) 126/57  Pulse: 81 80  Resp:  13  Temp:  36.6 C  SpO2: 98% 99%    Last Pain:  Vitals:   01/09/22 1613  TempSrc: Oral  PainSc:                  [place]

## 2022-01-15 ENCOUNTER — Encounter: Payer: Self-pay | Admitting: *Deleted

## 2022-02-09 NOTE — Progress Notes (Deleted)
Cardiology Office Note Date:  02/09/2022   ID:  Edward [name], DOB 05/18/1941, MRN [id number]  PCP:  [name], MD  Cardiologist:  Tashai Catino [place] MD  No chief complaint on file.  History of Present Illness: Edward [name] is a 81 y.o. male who is seen for evaluation of CAD.   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 This was his second episode, the previous occurring approximately one year earlier. He had been compliant with aspirin prior to the first event, and has subsequently been compliant with plavix which was started  at the time of his first event.  His evaluation included an echocardiogram, carotid ultrasound, CT and MRI studies of the brain.  All of these were unrevealing.  There was no evidence of cortical infarction on his neuroimaging studies.  He had no arrhythmia documented.  His  TEE demonstrated a small PFO. He was seen by Dr. Excell Seltzer for consideration of PFO closure but after discussion with Neurology this was not felt to be indication. He subsequently had an implantable loop recorder placed by Dr Ladona Ridgel. There is a nursing note from Jan 3 stating he had an episode of AFib and he was started on Eliquis. 2 days after starting Eliquis he developed a hematoma in his left shoulder. Eliquis was reduced for 2 weeks then increase back to 5 mg bid. Plavix was stoppped.   He has a history of CAD with remote angioplasty of the LAD in 1995.  He really has never had recurrent angina. He does have  a history of vertigo. Last loop recorder check on Dec 29 showed only one episode of Afib lasting minutes.  He was seen in January with transient confusion of unclear etiology. Carotid dopplers showed no significant stenosis. Seen by Dr Everlena Cooper. EEG on 10/15/2020 was normal. MRI of brain without contrast on 10/30/2020  showed moderate  Cerebral chronic small vessel ischemic changes but no stroke or other acute intracranial  abnormalities. We discussed his PFO but given uncertainty regarding diagnosis did not feel that closure of PFO was indicated.   He was seen by Dr Ladona Ridgel in Dec and was doing well. Wanted to be considered for a Watchman device. Also wanted ILR removed. He had an umbilical hernia repair in March. He was seen by Dr Lalla Brothers in April. He did undergo placement of a Watchman device on 01/09/22.   On follow up today he is doing well. Notes some feeling of dizziness that has been present for at least a couple of years with imbalance. He is active around his house. No chest pain, SOB, palpitations.    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 Lalla Brothers   Spermatocele    left   Stroke [facility])    TIA's   Urgency  of urination    Vertigo     Past Surgical History:  Procedure Laterality Date   CORONARY ANGIOPLASTY  1995  DR [place]   LAD   CYSTOSCOPY N/A 09/18/2015   Procedure: CYSTOSCOPY;  Surgeon: Jerilee Field, MD;  Location: [facility];  Service: Urology;  Laterality: N/A;   CYSTOSCOPY WITH RETROGRADE PYELOGRAM, URETEROSCOPY AND STENT PLACEMENT Left 03/01/2013   Procedure: CYSTOSCOPY WITH LEFT RETROGRADE PYELOGRAM, LEFT URETEROSCOPY and stent PLACEMENT;  Surgeon: Antony Haste, MD;  Location: [facility];  Service: Urology;  Laterality: Left;   FOOT FUSION Left    SECONDARY TO FX'S   GREEN LIGHT LASER TURP (TRANSURETHRAL RESECTION OF PROSTATE N/A 10/04/2013   Procedure: GREEN LIGHT LASER TURP  (TRANSURETHRAL RESECTION OF PROSTATE;  Surgeon: Antony Haste, MD;  Location: [facility];  Service: Urology;  Laterality: N/A;   INGUINAL HERNIA REPAIR Bilateral    LEFT ATRIAL APPENDAGE OCCLUSION N/A 01/09/2022   Procedure: LEFT ATRIAL APPENDAGE OCCLUSION;  Surgeon: Lanier Prude, MD;  Location: [facility];  Service: Cardiovascular;  Laterality: N/A;   LOOP RECORDER INSERTION N/A 04/20/2018   Procedure: LOOP RECORDER INSERTION;  Surgeon: Marinus Maw, MD;  Location: [facility];  Service: Cardiovascular;  Laterality: N/A;   SHOULDER ARTHROSCOPY Right 2004   SHOULDER ARTHROSCOPY WITH DEBRIDEMENT AND BICEP TENDON REPAIR Left 04-28-2011   SPERMATOCELECTOMY Left 10/04/2013   Procedure: LEFT SPERMATOCELECTOMY;  Surgeon: Antony Haste, MD;  Location: [facility];  Service: Urology;  Laterality: Left;   TEE WITHOUT CARDIOVERSION N/A 02/16/2018   Procedure: TRANSESOPHAGEAL ECHOCARDIOGRAM (TEE);  Surgeon: Chrystie Nose, MD;  Location: [facility];  Service: Cardiovascular;  Laterality: N/A;   TEE WITHOUT CARDIOVERSION N/A 01/09/2022   Procedure: TRANSESOPHAGEAL ECHOCARDIOGRAM (TEE);  Surgeon: Lanier Prude, MD;  [facility] CV LAB;  Service: Cardiovascular;  Laterality: N/A;   TRANSURETHRAL RESECTION OF PROSTATE N/A 12/11/2014   Procedure: CYSTOSCOPY, CLOT EVACUATION, WITH FULGURATION ;  Surgeon: Bjorn Pippin, MD;  Location: [facility];  Service: Urology;  Laterality: N/A;   TRANSURETHRAL RESECTION OF PROSTATE N/A 09/18/2015   Procedure: TRANSURETHRAL RESECTION OF THE PROSTATE (TURP);  Surgeon: Jerilee Field, MD;  Location: [facility];  Service: Urology;  Laterality: N/A;   UMBILICAL HERNIA REPAIR  06-02-2008   UMBILICAL HERNIA REPAIR N/A 11/12/2021   Procedure: UMBILICAL HERNIA REPAIR;  Surgeon: Abigail Miyamoto, MD;  Location: [facility];  Service: General;  Laterality: N/A;  LMA    Current Outpatient Medications  Medication Sig Dispense Refill   acetaminophen  (TYLENOL) 500 MG tablet Take 500 mg by mouth every 6 (six) hours as needed for moderate pain or mild pain.     amLODipine (NORVASC) 5 MG tablet TAKE 1 TABLET BY MOUTH  DAILY (Patient taking differently: Take 5 mg by mouth daily at 12 noon.) 90 tablet 3   apixaban (ELIQUIS) 5 MG TABS tablet Take 1 tablet (5 mg total) by mouth 2 (two) times daily. 180 tablet 3   budesonide-formoterol (SYMBICORT) 160-4.5 MCG/ACT inhaler Inhale 1 puff into the lungs 2 (two) times daily as needed (chest tightness).     Cholecalciferol (VITAMIN D3 PO) Take by mouth.     Coenzyme Q10 (CO Q 10) 100 MG CAPS Take 100 mg by mouth daily at 12 noon.     Cyanocobalamin (VITAMIN B-12 PO) Take 1 tablet by mouth every other day. Opposite the days of the multivitamin     losartan (COZAAR) 100 MG tablet TAKE 1 TABLET  BY MOUTH  DAILY (Patient taking differently: Take 100 mg by mouth every evening.) 90 tablet 3   MAGNESIUM CITRATE PO Take 250 mg by mouth every other day.     Multiple Vitamin (MULTIVITAMIN WITH MINERALS) TABS tablet Take 1 tablet by mouth every other day.     Multiple Vitamins-Minerals (ZINC PO) Take by mouth.     Probiotic Product (PROBIOTIC PO) Take 1 capsule by mouth in the morning. In the morning     rosuvastatin (CRESTOR) 20 MG tablet TAKE 1 TABLET BY MOUTH IN  THE EVENING 90 tablet 3   tamsulosin (FLOMAX) 0.4 MG CAPS capsule Take 0.4 mg by mouth at bedtime.     traMADol (ULTRAM) 50 MG tablet Take 1 tablet (50 mg total) by mouth every 6 (six) hours as needed for moderate pain or severe pain. 25 tablet 0   VITAMIN E PO Take by mouth. Opposite the days of the zinc, vitamin e, vitamin b-12, vitamin d3     No current facility-administered medications for this visit.    Allergies:   Augmentin [amoxicillin-pot clavulanate], Codeine, Levaquin [levofloxacin], and Zocor [simvastatin]   Social History:  The patient  reports that he quit smoking about 51 years ago. His smoking use included cigarettes. He has a 1.50  pack-year smoking history. He has never used smokeless tobacco. He reports current alcohol use. He reports that he does not use drugs.   Family History:  The patient's family history is not on file.    ROS:  Please see the history of present illness.  All other systems are reviewed and negative.    PHYSICAL EXAM: VS:  There were no vitals taken for this visit. , BMI There is no height or weight on file to calculate BMI. GEN: Well nourished, well developed, in no acute distress  HEENT: normal  Neck: no JVD, no masses. No carotid bruits Cardiac: RRR  No gallop. There is a gr 1-2/6 diastolic murmur LSB to apex.        Respiratory:  clear to auscultation bilaterally, normal work of breathing GI: soft, nontender, nondistended, + BS MS: no deformity or atrophy. There is a large mature bruise in the left shoulder and upper arm. Ext: no pretibial edema, pedal pulses 2+= bilaterally Skin: warm and dry, no rash Neuro:  Strength and sensation are intact Psych: euthymic mood, full affect  EKG:  EKG is not ordered today.     Recent Labs: 01/06/2022: BUN 14; Creatinine, Ser 0.90; Hemoglobin 12.4; Platelets 172; Potassium 4.3; Sodium 137   Lipid Panel     Component Value Date/Time   CHOL 117 01/15/2018 0453   TRIG 64 01/15/2018 0453   HDL 41 01/15/2018 0453   CHOLHDL 2.9 01/15/2018 0453   VLDL 13 01/15/2018 0453   LDLCALC 63 01/15/2018 0453   Dated 04/09/18: cholesterol 134, triglycerides 87, HDL 45, LDL 72. CBC, chemistries, TSH normal. Dated 04/21/19: cholesterol 118, triglycerides 66, HDL 40, LDL 65. CMET, CBC, TSH normal Dated 04/25/20: cholesterol 120, triglycerides 46, HDL 44, LDL 67.  Dated 09/24/20: normal CMET Dated 05/17/21: cholesterol 130, triglycerides 71, HDL 48, LDL 68. LFTs and TSH normal.  Wt Readings from Last 3 Encounters:  01/09/22 150 lb (68 kg)  01/06/22 150 lb 6.4 oz (68.2 kg)  12/06/21 150 lb (68 kg)     Cardiac Studies Reviewed: TEE 02-16-2018: Study Conclusions    - Left ventricle: The cavity size was normal. There was mild   concentric hypertrophy. Systolic function was normal.  The   estimated ejection fraction was in the range of 55% to 60%. Wall   motion was normal; there were no regional wall motion   abnormalities. - Aortic valve: Trileaflet. Mild to moderate eccentric AI, 2 jets,   the larger jet is toward the anterior mitral valve leaflet. - Aorta: Dilated sinus of valsalva to 4.1 cm. - Left atrium: Mildly dilated. No evidence of thrombus in the   atrial cavity or appendage. - Right atrium: No evidence of thrombus in the atrial cavity or   appendage. - Atrial septum: There is a small PFO by saline microbubble   contrast which demonstrates intermittent right to left flow.   Impressions:   - Small PFO with intermittent right to left flow noted by saline   microbubble contrast. This can be a possible mechanism for his   TIA. Consider referral to Dr. Excell Seltzer with the structural heart   clinic for closure evaluation.  Assessment/Plan:  1. CAD remote PTCA of the LAD in 1995. No recurrent angina. Continue amlodipine and statin. No antiplatelet therapy due to being on Eliquis 2. Paroxysmal Afib. Noted on loop recorder. Marland Kitchen He is  asymptomatic.  Continue  Eliquis.  3. HLD excellent control on Crestor 4. HTN well controlled.  5. Transient confusion. Negative Neurologic evaluation. No indication for PFO closure now.  6. 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  I will follow up in one year   Signed, Riyan Haile [place], MD  02/09/2022 12:39 PM    [facility] [address] [place], [place], [place]  [address] Phone: [phone]; Fax: [phone]

## 2022-02-12 ENCOUNTER — Telehealth: Payer: Self-pay | Admitting: Cardiology

## 2022-02-12 ENCOUNTER — Ambulatory Visit: Payer: Medicare Other | Admitting: Cardiology

## 2022-02-12 NOTE — Telephone Encounter (Signed)
Patient cancelled follow up OV with Dr. [place] today. Called the patient to re-schedule however he was driving and unable to look at his calender. I have tentatively scheduled him for 7/14 with myself. He will need to be called for medication changes 7/6 (stop Eliquis and transition to Plavix). He has follow up CT imaging 7/24.    Georgie Chard NP-C Structural Heart Team  Pager: [phone] Phone: [phone]

## 2022-02-20 ENCOUNTER — Other Ambulatory Visit: Payer: Self-pay

## 2022-02-20 ENCOUNTER — Telehealth: Payer: Self-pay | Admitting: Cardiology

## 2022-02-20 DIAGNOSIS — I480 Paroxysmal atrial fibrillation: Secondary | ICD-10-CM

## 2022-02-20 MED ORDER — ROSUVASTATIN CALCIUM 20 MG PO TABS: 20.0000 mg | ORAL_TABLET | Freq: Every evening | ORAL | 3 refills | Status: DC

## 2022-02-20 MED ORDER — LOSARTAN POTASSIUM 100 MG PO TABS: 100.0000 mg | ORAL_TABLET | Freq: Every day | ORAL | 3 refills | Status: DC

## 2022-02-20 MED ORDER — AMLODIPINE BESYLATE 5 MG PO TABS: 5.0000 mg | ORAL_TABLET | Freq: Every day | ORAL | 3 refills | Status: DC

## 2022-02-20 NOTE — Telephone Encounter (Signed)
*  STAT* If patient is at the pharmacy, call can be transferred to refill team.   1. Which medications need to be refilled? (please list name of each medication and dose if known)  amLODipine (NORVASC) 5 MG tablet losartan (COZAAR) 100 MG tablet rosuvastatin (CRESTOR) 20 MG tablet apixaban (ELIQUIS) 5 MG TABS tablet  2. Which pharmacy/location (including street and city if local pharmacy) is medication to be sent to? [facility] [address] [place] - [address]  3. Do they need a 30 day or 90 day supply? 90 day supply

## 2022-02-20 NOTE — Telephone Encounter (Signed)
Called to confirm appointment with Edward [name] 7/14 at noon.  Left message to call back.

## 2022-02-21 ENCOUNTER — Other Ambulatory Visit: Payer: Self-pay

## 2022-02-21 MED ORDER — APIXABAN 5 MG PO TABS: 5.0000 mg | ORAL_TABLET | Freq: Two times a day (BID) | ORAL | 2 refills | Status: DC

## 2022-02-21 MED ORDER — CLOPIDOGREL BISULFATE 75 MG PO TABS: 75.0000 mg | ORAL_TABLET | Freq: Every day | ORAL | 1 refills | Status: DC

## 2022-02-21 NOTE — Telephone Encounter (Signed)
Patient is requesting Eliquis 5 mg sent to [facility] [address] with 90 day supply

## 2022-02-21 NOTE — Telephone Encounter (Signed)
Eliquis 5mg  refill request received. Patient is 81 years old, weight-68kg, Crea-0.90 on 01/06/2022, Diagnosis-Afib, and last seen by 01/08/2022 on 01/06/2022 Dose is appropriate based on dosing criteria. Will send in refill to requested pharmacy.

## 2022-02-21 NOTE — Telephone Encounter (Signed)
The patient called and confirmed office visit 7/14. He understands he will have lab work drawn that day and will receive his instruction letter for his post-Watchman CT. He has limited Eliquis left (he does not remember how many tablets), but will try to make it to 7/14 visit for medication transition. If not, he will start Plavix 75 mg daily when he runs out of Eliquis. Plavix called in to [facility] for him to pick up. He was grateful for call and agrees with plan.

## 2022-02-21 NOTE — Addendum Note (Signed)
Addended by: Gunnar Fusi A on: 02/21/2022 11:00 AM   Modules accepted: Orders

## 2022-02-26 NOTE — Progress Notes (Unsigned)
[facility]ENTER   MULTIDISCIPLINARY HEART VALVE CLINIC                                     Cardiology Office Note:    Date:  03/03/2022   ID:  Edward [name], DOB May 05, 1941, MRN [id number]  PCP:  [name], MD  [facility] Cardiologist:  Peter Swaziland, MD /Dr. Lalla Brothers, MD [facility] Electrophysiologist:  None   Referring MD: [name], MD   Chief Complaint  Patient presents with   Follow-up    S/p Watchman implant    History of Present Illness:    TAMARCUS CONDIE is a 81 y.o. male with a hx of cryogenic stroke s/p ILR found to have paroxysmal atrial fibrillation who is currently taking Eliquis. Also a hx of CAD s/p angioplasty of the LAD 1995, HLD, and HTN who was referred to Dr. Lalla Brothers 12/06/21 for the evaluation of possible Watchman.    Mr. Kalas is followed by Dr. Swaziland for his cardiology care. He was noted to have had a TIA in 2018 with recurrent episode 12/2017. His evaluation included an echocardiogram, carotid ultrasound, CT and MRI studies of the brain. All of these were unrevealing. There was no evidence of cortical infarction on his neuroimaging studies.  He had no arrhythmia documented. His TEE demonstrated a small PFO and the patient was seen by Dr. Excell Seltzer for consideration of PFO closure however after discussion with Neurology, this was not felt to be indicated. He subsequently had an implantable loop recorder placed by Dr Ladona Ridgel which showed new atrial fibrillation and he was therefore started on Eliquis.    He has a history of CAD with remote angioplasty of the LAD in 1995. He has never had recurrent angina.    Mr. Zhen last saw Dr. Ladona Ridgel 08/14/2021 for atrial fibrillation and IRL follow up and showed interest in Watchman implant to avoid long term exposure to anticoagulation. IRL was nearing end of life and the patient wished to have this removed.    On Dr. Lovena Neighbours evaluation, plan was to proceed with Watchman workup with  CT imagining that showed anatomy suitable for implant.   After discharge, the Patient called in regards to new blood in his urine this afternoon. He underwent Watchman implant yesterday and was discharged home without complication. He reports urinating several times throughout the day with no evidence of bleeding. Most recently, he noticed bright red blood in the toilet. He has no urethral or back pain with this. Groin site is stable per his report. He states he has had similar symptoms in the past however this was generally associated with kidney stones. He has been on Eliquis for many years with no issues and no new medications were added after Watchman implant. I have asked him to monitor his symptoms for worsening. My hope is that he may have had a small ureteral clot that passed and he will have resolution of bleeding. If bleeding worsens, he will need to call our on call team through the [facility] office over the weekend/holiday  Patient cancelled follow up OV with Dr. Swaziland today. Called the patient to re-schedule however he was driving and unable to look at his calender. I tentatively scheduled him for 7/14 with myself. He will need to be called for medication changes 7/6 (stop Eliquis and transition to Plavix). He has follow up CT imaging 7/24.  Today he presents for post Watchman follow up. He has been doing well since implant. He has already transitioned to Plavix without complication. No chest pain, palpitations, LE edema, orthopnea, bleeding in stool or urine, dizziness or syncope.   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 Lalla Brothers   Spermatocele    left   Stroke [facility])    TIA's   Urgency of urination    Vertigo     Past Surgical History:  Procedure Laterality Date   CORONARY ANGIOPLASTY  1995  DR Swaziland   LAD   CYSTOSCOPY N/A 09/18/2015   Procedure: CYSTOSCOPY;  Surgeon: Jerilee Field, MD;  Location: [facility];  Service: Urology;  Laterality: N/A;   CYSTOSCOPY WITH RETROGRADE PYELOGRAM, URETEROSCOPY AND STENT PLACEMENT Left 03/01/2013   Procedure: CYSTOSCOPY WITH LEFT RETROGRADE PYELOGRAM, LEFT URETEROSCOPY and stent PLACEMENT;  Surgeon: Antony Haste, MD;  Location: [facility];  Service: Urology;  Laterality: Left;   FOOT FUSION Left    SECONDARY TO FX'S   GREEN LIGHT LASER TURP (TRANSURETHRAL RESECTION OF PROSTATE N/A 10/04/2013   Procedure: GREEN LIGHT LASER TURP (TRANSURETHRAL RESECTION OF PROSTATE;  Surgeon: Antony Haste, MD;  Location: [facility];  Service: Urology;  Laterality: N/A;   INGUINAL HERNIA REPAIR Bilateral    LEFT ATRIAL APPENDAGE OCCLUSION N/A 01/09/2022   Procedure: LEFT ATRIAL APPENDAGE OCCLUSION;  Surgeon: Lanier Prude, MD;  Location: [facility];  Service: Cardiovascular;  Laterality: N/A;   LOOP RECORDER INSERTION N/A 04/20/2018   Procedure: LOOP RECORDER INSERTION;  Surgeon: Marinus Maw, MD;  Location: [facility];  Service: Cardiovascular;  Laterality: N/A;   SHOULDER ARTHROSCOPY Right 2004   SHOULDER ARTHROSCOPY WITH DEBRIDEMENT AND BICEP TENDON REPAIR Left 04-28-2011   SPERMATOCELECTOMY Left 10/04/2013   Procedure: LEFT SPERMATOCELECTOMY;  Surgeon: Antony Haste, MD;  Location: [facility];  Service: Urology;  Laterality: Left;   TEE WITHOUT CARDIOVERSION N/A 02/16/2018   Procedure: TRANSESOPHAGEAL ECHOCARDIOGRAM (TEE);  Surgeon: Chrystie Nose, MD;  Location: [facility] ENDOSCOPY;  Service:  Cardiovascular;  Laterality: N/A;   TEE WITHOUT CARDIOVERSION N/A 01/09/2022   Procedure: TRANSESOPHAGEAL ECHOCARDIOGRAM (TEE);  Surgeon: Lanier Prude, MD;  Location: [facility] CV LAB;  Service: Cardiovascular;  Laterality: N/A;   TRANSURETHRAL RESECTION OF PROSTATE N/A 12/11/2014   Procedure: CYSTOSCOPY, CLOT EVACUATION, WITH FULGURATION ;  Surgeon: Bjorn Pippin, MD;  Location: [facility];  Service: Urology;  Laterality: N/A;   TRANSURETHRAL RESECTION OF PROSTATE N/A 09/18/2015   Procedure: TRANSURETHRAL RESECTION OF THE PROSTATE (TURP);  Surgeon: Jerilee Field, MD;  Location: [facility];  Service: Urology;  Laterality: N/A;   UMBILICAL HERNIA REPAIR  06-02-2008   UMBILICAL HERNIA REPAIR N/A 11/12/2021   Procedure: UMBILICAL HERNIA REPAIR;  Surgeon: Abigail Miyamoto, MD;  Location: [facility] OR;  Service: General;  Laterality: N/A;  LMA    Current Medications: Current Meds  Medication Sig   acetaminophen (TYLENOL) 500 MG tablet Take 500 mg by mouth every 6 (six) hours as needed for moderate pain or mild pain.   amLODipine (NORVASC) 5 MG tablet Take 1 tablet (5 mg total) by mouth daily.   azithromycin (ZITHROMAX) 500 MG tablet Take 1 tablet by mouth 1 hour prior to dental procedures or cleanings   budesonide-formoterol (SYMBICORT) 160-4.5 MCG/ACT inhaler Inhale 1 puff into the lungs 2 (two) times daily as needed (chest tightness).   Cholecalciferol (VITAMIN D3 PO) Take by mouth.   clopidogrel (PLAVIX) 75 MG tablet Take 1 tablet (75 mg total) by mouth daily.   Coenzyme Q10 (CO Q 10) 100 MG CAPS Take 100 mg by mouth daily at 12 noon.   Cyanocobalamin (VITAMIN B-12 PO) Take 1 tablet by mouth every other day. Opposite the days of the multivitamin   losartan (COZAAR) 100 MG tablet Take 1 tablet (100 mg total) by mouth daily.   MAGNESIUM CITRATE PO Take 250 mg by mouth every other day.   Multiple Vitamin (MULTIVITAMIN WITH MINERALS) TABS tablet Take 1 tablet by mouth every other day.   Multiple  Vitamins-Minerals (ZINC PO) Take by mouth.   Probiotic Product (PROBIOTIC PO) Take 1 capsule by mouth in the morning. In the morning   rosuvastatin (CRESTOR) 20 MG tablet Take 1 tablet (20 mg total) by mouth every evening.   tamsulosin (FLOMAX) 0.4 MG CAPS capsule Take 0.4 mg by mouth at bedtime.   traMADol (ULTRAM) 50 MG tablet Take 1 tablet (50 mg total) by mouth every 6 (six) hours as needed for moderate pain or severe pain.   VITAMIN E PO Take by mouth. Opposite the days of the zinc, vitamin e, vitamin b-12, vitamin d3   [DISCONTINUED] apixaban (ELIQUIS) 5 MG TABS tablet Take 1 tablet (5 mg total) by mouth 2 (two) times daily.     Allergies:   Augmentin [amoxicillin-pot clavulanate], Codeine, Levaquin [levofloxacin], and Zocor [simvastatin]   Social History   Socioeconomic History   Marital status: Married    Spouse name: Not on file   Number of children: 0   Years of education: Not on file   Highest education level: Not on file  Occupational History   Occupation: Technical sales engineer    Comment: retired  Tobacco Use   Smoking status: Former    Packs/day: 0.50    Years: 3.00    Total pack years: 1.50    Types: Cigarettes    Quit date: 12/18/1970    Years since quitting: 51.2   Smokeless tobacco: Never  Vaping Use   Vaping Use: Never used  Substance and Sexual Activity   Alcohol use: Yes    Comment: OCCASIONAL   Drug use: No   Sexual activity: Not on file  Other Topics Concern   Not on file  Social History Narrative   Not on file   Social Determinants of Health   Financial Resource Strain: Not on file  Food Insecurity: Not on file  Transportation Needs: Not on file  Physical Activity: Not on file  Stress: Not on file  Social Connections: Not on file     Family History: The patient's family history is not on file.  ROS:   Please see the history of present illness.    All other systems reviewed and are negative.  EKGs/Labs/Other Studies Reviewed:     The following studies were reviewed today:  CT cardiac morph/pul 12/19/21:   IMPRESSION: 1.  Wind Sock LAA with no thrombus   2. Landing zone average diameter 23.9 mm suitable for a 27 mm Watchman FLX device   3.  Normal PV anatomy with no anomaly   4.  Elongated atrial septum with no ASD/PFO noted   5.  Normal ascending thoracic aorta 3.6 cm   6.  No pericardial effusion   7.  Moderate bi atrial enlargement   TEE 02/16/2018: Study Conclusions   - Left ventricle: The cavity size was normal. There was mild   concentric hypertrophy. Systolic function was normal. The   estimated ejection fraction was in the range of 55% to 60%. Wall   motion was normal; there were no regional wall motion   abnormalities. - Aortic valve: Trileaflet. Mild to moderate eccentric AI, 2 jets,   the larger jet is toward the anterior mitral valve leaflet. - Aorta: Dilated sinus of valsalva to 4.1 cm. - Left atrium: Mildly dilated. No evidence of thrombus in the   atrial cavity or appendage. - Right atrium: No evidence of thrombus in the atrial cavity or   appendage. - Atrial septum: There is a small PFO by saline microbubble   contrast which demonstrates intermittent right to left flow.   Impressions:   - Small PFO with intermittent right to left flow noted by saline   microbubble contrast. This can be a possible mechanism for his   TIA. Consider referral to Dr. Excell Seltzer with the structural heart   clinic for closure evaluation.  EKG:  EKG is ordered today.  The ekg ordered today demonstrates NSR with HR 67bpm.   Recent Labs: 02/28/2022: BUN 12; Creatinine, Ser 0.96; Hemoglobin 11.2; Platelets 176; Potassium 3.9; Sodium 140  Recent Lipid Panel    Component Value Date/Time   CHOL 117 01/15/2018 0453   TRIG 64 01/15/2018 0453   HDL 41 01/15/2018 0453   CHOLHDL 2.9 01/15/2018 0453   VLDL 13 01/15/2018 0453   LDLCALC 63 01/15/2018 0453   Physical Exam:    VS:  BP (!) 118/58   Pulse 67    Ht 5\' 6"  (1.676 m)   Wt 147 lb 6.4 oz (66.9 kg)   SpO2 97%   BMI 23.79 kg/m     Wt Readings from Last 3 Encounters:  02/28/22 147 lb 6.4 oz (66.9 kg)  01/09/22 150 lb (68 kg)  01/06/22 150 lb 6.4 oz (68.2 kg)    General: Well developed, well nourished, NAD Lungs:Clear to ausculation bilaterally. No wheezes, rales, or rhonchi. Breathing is unlabored. Cardiovascular: RRR with S1 S2. Extremities: No edema.  Neuro: Alert and oriented. No focal deficits. No facial asymmetry. MAE spontaneously. Psych: Responds to questions appropriately with normal affect.    ASSESSMENT/PLAN:    PAF: Patient is now s/p implant with Watchman FLX 26mm device. Medication plan was to stop Eliquis 45 days after implant (02/20/2022) and transition to Plavix 75mg  PO daily. He has tolerated this well with no issues. He is now scheduled for post implant imaging with CT on 03/10/22. Obtain BMET, CBC. Discussed the need for SBE for 6 months post implant (. /Given Amoxicillin allergy, will RX Azithromycin.   Hx of CVA: No deficits. Continue current regimen.    CAD: s/p LAD angioplasty 1995 with no anginal symptoms. Continue statin. No ASA due to [facility]. No beta blocker given asymptomatic bradycardia.    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 Noted to have moderate AR on last echocardiogram  from 12/16/21 with two separate jets. Continue to follow with surveillance imaging.    HTN: Stable, no medication changes.   HLD: Last LDL, 63 on 01/15/18. Continue Crestor.  Medication Adjustments/Labs and Tests Ordered: Current medicines are reviewed at length with the patient today.  Concerns regarding medicines are outlined above.  Orders Placed This Encounter  Procedures   CBC with Differential/Platelet   Basic metabolic panel   EKG 12-Lead   Meds ordered this encounter  Medications   azithromycin (ZITHROMAX) 500 MG tablet    Sig: Take 1 tablet by mouth 1 hour prior to dental procedures or cleanings    Dispense:  2 tablet     Refill:  0    Patient Instructions  Medication Instructions:  Your physician has recommended you make the following change in your medication:  1-STOP Eliquis 2-START Plavix 75 mg by mouth daily 3-TAKE Azithromycin 500 mg  *If you need a refill on your cardiac medications before your next appointment, please call your pharmacy*  Lab Work: Your physician recommends that you have lab work today- BMET and CBC  If you have labs (blood work) drawn today and your tests are completely normal, you will receive your results only by: [employer]MyChart Message (if you have MyChart) OR A paper copy in the mail If you have any lab test that is abnormal or we need to change your treatment, we will call you to review the results.  Follow-Up: At [facility], you and your health needs are our priority.  As part of our continuing mission to provide you with exceptional heart care, we have created designated Provider Care Teams.  These Care Teams include your primary Cardiologist (physician) and Advanced Practice Providers (APPs -  Physician Assistants and Nurse Practitioners) who all work together to provide you with the care you need, when you need it.  We recommend signing up for the patient portal called "MyChart".  Sign up information is provided on this After Visit Summary.  MyChart is used to connect with patients for Virtual Visits (Telemedicine).  Patients are able to view lab/test results, encounter notes, upcoming appointments, etc.  Non-urgent messages can be sent to your provider as well.   To learn more about what you can do with MyChart, go to [email or web address].    Your next appointment:   already scheduled with Dr. SwazilandJordan    Important Information About Sugar         Signed, Georgie ChardJill Jadan Hinojos, NP  03/03/2022 8:09 AM    [facility]

## 2022-02-28 ENCOUNTER — Ambulatory Visit (INDEPENDENT_AMBULATORY_CARE_PROVIDER_SITE_OTHER): Payer: Medicare Other | Admitting: Cardiology

## 2022-02-28 VITALS — BP 118/58 | HR 67 | Ht 66.0 in | Wt 147.4 lb

## 2022-02-28 DIAGNOSIS — I351 Nonrheumatic aortic (valve) insufficiency: Secondary | ICD-10-CM | POA: Diagnosis not present

## 2022-02-28 DIAGNOSIS — G459 Transient cerebral ischemic attack, unspecified: Secondary | ICD-10-CM

## 2022-02-28 DIAGNOSIS — I480 Paroxysmal atrial fibrillation: Secondary | ICD-10-CM | POA: Diagnosis not present

## 2022-02-28 DIAGNOSIS — Z95818 Presence of other cardiac implants and grafts: Secondary | ICD-10-CM

## 2022-02-28 DIAGNOSIS — I2510 Atherosclerotic heart disease of native coronary artery without angina pectoris: Secondary | ICD-10-CM | POA: Diagnosis not present

## 2022-02-28 MED ORDER — AZITHROMYCIN 500 MG PO TABS: ORAL_TABLET | ORAL | 0 refills | Status: DC

## 2022-02-28 NOTE — Patient Instructions (Signed)
Medication Instructions:  Your physician has recommended you make the following change in your medication:  1-STOP Eliquis 2-START Plavix 75 mg by mouth daily 3-TAKE Azithromycin 500 mg  *If you need a refill on your cardiac medications before your next appointment, please call your pharmacy*  Lab Work: Your physician recommends that you have lab work today- BMET and CBC  If you have labs (blood work) drawn today and your tests are completely normal, you will receive your results only by: [employer] (if you have MyChart) OR A paper copy in the mail If you have any lab test that is abnormal or we need to change your treatment, we will call you to review the results.  Follow-Up: At [facility], you and your health needs are our priority.  As part of our continuing mission to provide you with exceptional heart care, we have created designated Provider Care Teams.  These Care Teams include your primary Cardiologist (physician) and Advanced Practice Providers (APPs -  Physician Assistants and Nurse Practitioners) who all work together to provide you with the care you need, when you need it.  We recommend signing up for the patient portal called "MyChart".  Sign up information is provided on this After Visit Summary.  MyChart is used to connect with patients for Virtual Visits (Telemedicine).  Patients are able to view lab/test results, encounter notes, upcoming appointments, etc.  Non-urgent messages can be sent to your provider as well.   To learn more about what you can do with MyChart, go to [email or web address].    Your next appointment:   already scheduled with Dr. Swaziland    Important Information About Sugar

## 2022-03-01 LAB — CBC WITH DIFFERENTIAL/PLATELET
Basophils Absolute: 0.1 10*3/uL (ref 0.0–0.2)
Basos: 1 %
EOS (ABSOLUTE): 0.3 10*3/uL (ref 0.0–0.4)
Eos: 5 %
Hematocrit: 34.1 % — ABNORMAL LOW (ref 37.5–51.0)
Hemoglobin: 11.2 g/dL — ABNORMAL LOW (ref 13.0–17.7)
Immature Grans (Abs): 0 10*3/uL (ref 0.0–0.1)
Immature Granulocytes: 0 %
Lymphocytes Absolute: 1.2 10*3/uL (ref 0.7–3.1)
Lymphs: 21 %
MCH: 30.7 pg (ref 26.6–33.0)
MCHC: 32.8 g/dL (ref 31.5–35.7)
MCV: 93 fL (ref 79–97)
Monocytes Absolute: 0.6 10*3/uL (ref 0.1–0.9)
Monocytes: 10 %
Neutrophils Absolute: 3.6 10*3/uL (ref 1.4–7.0)
Neutrophils: 63 %
Platelets: 176 10*3/uL (ref 150–450)
RBC: 3.65 x10E6/uL — ABNORMAL LOW (ref 4.14–5.80)
RDW: 12.4 % (ref 11.6–15.4)
WBC: 5.7 10*3/uL (ref 3.4–10.8)

## 2022-03-01 LAB — BASIC METABOLIC PANEL
BUN/Creatinine Ratio: 13 (ref 10–24)
BUN: 12 mg/dL (ref 8–27)
CO2: 22 mmol/L (ref 20–29)
Calcium: 9.6 mg/dL (ref 8.6–10.2)
Chloride: 106 mmol/L (ref 96–106)
Creatinine, Ser: 0.96 mg/dL (ref 0.76–1.27)
Glucose: 103 mg/dL — ABNORMAL HIGH (ref 70–99)
Potassium: 3.9 mmol/L (ref 3.5–5.2)
Sodium: 140 mmol/L (ref 134–144)
eGFR: 80 mL/min/{1.73_m2} (ref >59)

## 2022-03-04 ENCOUNTER — Other Ambulatory Visit: Payer: Self-pay

## 2022-03-07 ENCOUNTER — Telehealth (HOSPITAL_COMMUNITY): Payer: Self-pay | Admitting: *Deleted

## 2022-03-07 NOTE — Telephone Encounter (Signed)
Reaching out to patient to offer assistance regarding upcoming cardiac imaging study; pt verbalizes understanding of appt date/time, parking situation and where to check in, pre-test NPO status, and verified current allergies; name and call back number provided for further questions should they arise  Larey Brick RN Navigator Cardiac Imaging [facility] [phone] office [phone] cell  He is aware to arrive at 9:30am.

## 2022-03-10 ENCOUNTER — Ambulatory Visit (HOSPITAL_COMMUNITY)
Admission: RE | Admit: 2022-03-10 | Discharge: 2022-03-10 | Disposition: A | Discharge status: Home / Self Care | Payer: Medicare Other | Source: Ambulatory Visit | Attending: Cardiology | Admitting: Cardiology

## 2022-03-10 DIAGNOSIS — I480 Paroxysmal atrial fibrillation: Secondary | ICD-10-CM | POA: Diagnosis present

## 2022-03-10 MED ORDER — IOHEXOL 350 MG/ML SOLN
100.0000 mL | Freq: Once | INTRAVENOUS | Status: AC | PRN
  Administered 2022-03-10: 100 mL via INTRAVENOUS

## 2022-03-12 ENCOUNTER — Telehealth: Payer: Self-pay | Admitting: Cardiology

## 2022-03-12 NOTE — Telephone Encounter (Signed)
Patient transitioned from Eliquis to Plavix after CT reviewed by Dr. Lalla Brothers 03/11/22. [place] updated.   Georgie Chard NP-C Structural Heart Team  Pager: [phone] Phone: [phone]

## 2022-04-04 ENCOUNTER — Telehealth: Payer: Self-pay | Admitting: Cardiology

## 2022-04-04 ENCOUNTER — Other Ambulatory Visit: Payer: Self-pay | Admitting: Urology

## 2022-04-04 NOTE — Telephone Encounter (Signed)
   Name: Edward [name]  DOB: 1941-03-19  MRN: [id number]  Primary Cardiologist: Peter Swaziland, MD  Chart reviewed as part of pre-operative protocol coverage.  Patient underwent Watchman procedure in 12/2021.  He was transitioned from Eliquis to Plavix on 03/12/2022.  Ideally, patient will remain on Plavix therapy uninterrupted for 6 months to prevent device thrombosis.  I discussed this recommendation with patient in the setting of surgical clearance request for upcoming urethral dilation. Patient states this is an elective procedure and is therefore able to wait until he may safely hold Plavix. Therefore, patient states he intends to postpone surgery until he has completed 6 months of Plavix therapy.   I will route this recommendation to the requesting party and remove from preop coverage pool.   Joylene Grapes, NP  04/04/2022, 3:58 PM

## 2022-04-04 NOTE — Telephone Encounter (Signed)
   Pre-operative Risk Assessment    Patient Name: Edward [name]  DOB: 06-Jan-1941 MRN: [id number]      Request for Surgical Clearance    Procedure:   Urethral Dilation   Date of Surgery:  Clearance 04/29/22                                 Surgeon:  Donato Heinz Surgeon's Group or Practice Name:  [facility]  Phone number:  [phone] Fax number:  [phone]   Type of Clearance Requested:   - Medical  - Pharmacy:  Hold Clopidogrel (Plavix) 5 days prior    Type of Anesthesia:  General    Additional requests/questions:    Alben Spittle   04/04/2022, 1:19 PM

## 2022-04-29 ENCOUNTER — Ambulatory Visit (HOSPITAL_BASED_OUTPATIENT_CLINIC_OR_DEPARTMENT_OTHER): Admit: 2022-04-29 | Payer: Medicare Other | Admitting: Urology

## 2022-04-29 ENCOUNTER — Encounter (HOSPITAL_BASED_OUTPATIENT_CLINIC_OR_DEPARTMENT_OTHER): Payer: Self-pay

## 2022-04-29 SURGERY — BALLOON DILATION
Anesthesia: General

## 2022-05-07 NOTE — Progress Notes (Unsigned)
Cardiology Office Note Date:  05/12/2022   ID:  Edward [name], DOB 04-18-1941, MRN [id number]  PCP:  [name], MD  Cardiologist:  Harlene Petralia [place] MD  Chief Complaint  Patient presents with   Coronary Artery Disease   History of Present Illness: Edward [name] is a 81 y.o. male who is seen for evaluation of CAD.   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 This was his second episode, the previous occurring approximately one year earlier. He had been compliant with aspirin prior to the first event, and has subsequently been compliant with plavix which was started  at the time of his first event.  His evaluation included an echocardiogram, carotid ultrasound, CT and MRI studies of the brain.  All of these were unrevealing.  There was no evidence of cortical infarction on his neuroimaging studies.  He had no arrhythmia documented.  His  TEE demonstrated a small PFO. He was seen by Dr. Excell Seltzer for consideration of PFO closure but after discussion with Neurology this was not felt to be indication. He subsequently had an implantable loop recorder placed by Dr Ladona Ridgel. There is a nursing note from Jan 3 stating he had an episode of AFib and he was started on Eliquis. 2 days after starting Eliquis he developed a hematoma in his left shoulder. Eliquis was reduced for 2 weeks then increase back to 5 mg bid. Plavix was stoppped.   He has a history of CAD with remote angioplasty of the LAD in 1995.  He really has never had recurrent angina. He does have  a history of vertigo. Last loop recorder check on Dec 29 showed only one episode of Afib lasting minutes.  He was seen in January with transient confusion of unclear etiology. Carotid dopplers showed no significant stenosis. Seen by Dr Everlena Cooper. EEG on 10/15/2020 was normal.  MRI of brain without contrast on 10/30/2020  showed moderate  Cerebral chronic small vessel ischemic changes but  no stroke or other acute intracranial abnormalities. We discussed his PFO but given uncertainty regarding diagnosis did not feel that closure of PFO was indicated.   Since my last visit patient wished to be considered for a Watchman device in order not to have to be on anticoagulation. He was seen by Dr Lalla Brothers and underwent left atrial appendage occlusive device placement with Watchman FLX 66mm in May.   He was having significant hematuria. 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 Treatment pending being able to come off Plavix. He is now on antibiotics. He denies any chest pain or SOB. No edema. No palpitations. Does get off balance easily. Only slight dizziness.    Past Medical History:  Diagnosis Date   Arthritis    right hand,  left foot   At risk for sleep apnea    STOP-BANG = 4    SENT TO PCP 02-10-215   BPH (benign prostatic hypertrophy)    Coronary artery disease CARIOLOGIST-- DR Donnie Aho   ANGIOPLASTY TO LAD   COVID 2022   mild case   Dysrhythmia    A-fib   Elevated PSA    Frequency of urination    History of kidney stones    Hyperlipidemia    Hypertension    Nocturia    Presence of Watchman left atrial appendage closure device 01/09/2022   Watchman FLX 81mm performed by Dr. Lalla Brothers   Spermatocele    left  Stroke [facility])    TIA's   Urgency of urination    Vertigo     Past Surgical History:  Procedure Laterality Date   CORONARY ANGIOPLASTY  1995  DR [place]   LAD   CYSTOSCOPY N/A 09/18/2015   Procedure: CYSTOSCOPY;  Surgeon: Jerilee Field, MD;  Location: [facility];  Service: Urology;  Laterality: N/A;   CYSTOSCOPY WITH RETROGRADE PYELOGRAM, URETEROSCOPY AND STENT PLACEMENT Left 03/01/2013   Procedure: CYSTOSCOPY WITH LEFT RETROGRADE PYELOGRAM, LEFT URETEROSCOPY and stent PLACEMENT;  Surgeon: Antony Haste, MD;  Location: [facility];  Service: Urology;  Laterality: Left;   FOOT FUSION Left    SECONDARY TO FX'S   GREEN LIGHT  LASER TURP (TRANSURETHRAL RESECTION OF PROSTATE N/A 10/04/2013   Procedure: GREEN LIGHT LASER TURP (TRANSURETHRAL RESECTION OF PROSTATE;  Surgeon: Antony Haste, MD;  Location: [facility];  Service: Urology;  Laterality: N/A;   INGUINAL HERNIA REPAIR Bilateral    LEFT ATRIAL APPENDAGE OCCLUSION N/A 01/09/2022   Procedure: LEFT ATRIAL APPENDAGE OCCLUSION;  Surgeon: Lanier Prude, MD;  Location: [facility];  Service: Cardiovascular;  Laterality: N/A;   LOOP RECORDER INSERTION N/A 04/20/2018   Procedure: LOOP RECORDER INSERTION;  Surgeon: Marinus Maw, MD;  Location: [facility];  Service: Cardiovascular;  Laterality: N/A;   SHOULDER ARTHROSCOPY Right 2004   SHOULDER ARTHROSCOPY WITH DEBRIDEMENT AND BICEP TENDON REPAIR Left 04-28-2011   SPERMATOCELECTOMY Left 10/04/2013   Procedure: LEFT SPERMATOCELECTOMY;  Surgeon: Antony Haste, MD;  Location: [facility];  Service: Urology;  Laterality: Left;   TEE WITHOUT CARDIOVERSION N/A 02/16/2018   Procedure: TRANSESOPHAGEAL ECHOCARDIOGRAM (TEE);  Surgeon: Chrystie Nose, MD;  Location[facility];  Service: Cardiovascular;  Laterality: N/A;   TEE WITHOUT CARDIOVERSION N/A 01/09/2022   Procedure: TRANSESOPHAGEAL ECHOCARDIOGRAM (TEE);  Surgeon: Lanier Prude, MD;  Location: [facility];  Service: Cardiovascular;  Laterality: N/A;   TRANSURETHRAL RESECTION OF PROSTATE N/A 12/11/2014   Procedure: CYSTOSCOPY, CLOT EVACUATION, WITH FULGURATION ;  Surgeon: Bjorn Pippin, MD;  Location: [facility];  Service: Urology;  Laterality: N/A;   TRANSURETHRAL RESECTION OF PROSTATE N/A 09/18/2015   Procedure: TRANSURETHRAL RESECTION OF THE PROSTATE (TURP);  Surgeon: Jerilee Field, MD;  Location: [facility];  Service: Urology;  Laterality: N/A;   UMBILICAL HERNIA REPAIR  06-02-2008   UMBILICAL HERNIA REPAIR N/A 11/12/2021   Procedure: UMBILICAL HERNIA REPAIR;  Surgeon: Abigail Miyamoto, MD;  Location: [facility];  Service: General;  Laterality:  N/A;  LMA    Current Outpatient Medications  Medication Sig Dispense Refill   acetaminophen (TYLENOL) 500 MG tablet Take 500 mg by mouth every 6 (six) hours as needed for moderate pain or mild pain.     amLODipine (NORVASC) 5 MG tablet Take 1 tablet (5 mg total) by mouth daily. 90 tablet 3   azithromycin (ZITHROMAX) 500 MG tablet Take 1 tablet by mouth 1 hour prior to dental procedures or cleanings 2 tablet 0   budesonide-formoterol (SYMBICORT) 160-4.5 MCG/ACT inhaler Inhale 1 puff into the lungs 2 (two) times daily as needed (chest tightness).     cephALEXin (KEFLEX) 500 MG capsule Take 500 mg by mouth 3 (three) times daily.     Cholecalciferol (VITAMIN D3 PO) Take by mouth.     clopidogrel (PLAVIX) 75 MG tablet Take 1 tablet (75 mg total) by mouth daily. 90 tablet 1   Coenzyme Q10 (CO Q 10) 100 MG CAPS Take 100 mg by mouth daily at  12 noon.     Cyanocobalamin (VITAMIN B-12 PO) Take 1 tablet by mouth every other day. Opposite the days of the multivitamin     losartan (COZAAR) 100 MG tablet Take 1 tablet (100 mg total) by mouth daily. 90 tablet 3   MAGNESIUM CITRATE PO Take 250 mg by mouth every other day.     Multiple Vitamin (MULTIVITAMIN WITH MINERALS) TABS tablet Take 1 tablet by mouth every other day.     Multiple Vitamins-Minerals (ZINC PO) Take by mouth.     Probiotic Product (PROBIOTIC PO) Take 1 capsule by mouth in the morning. In the morning     rosuvastatin (CRESTOR) 20 MG tablet Take 1 tablet (20 mg total) by mouth every evening. 90 tablet 3   tamsulosin (FLOMAX) 0.4 MG CAPS capsule Take 0.4 mg by mouth at bedtime.     VITAMIN E PO Take by mouth. Opposite the days of the zinc, vitamin e, vitamin b-12, vitamin d3     traMADol (ULTRAM) 50 MG tablet Take 1 tablet (50 mg total) by mouth every 6 (six) hours as needed for moderate pain or severe pain. (Patient not taking: Reported on 05/12/2022) 25 tablet 0   No current facility-administered medications for this visit.    Allergies:    Augmentin [amoxicillin-pot clavulanate], Codeine, Levaquin [levofloxacin], and Zocor [simvastatin]   Social History:  The patient  reports that he quit smoking about 51 years ago. His smoking use included cigarettes. He has a 1.50 pack-year smoking history. He has never used smokeless tobacco. He reports current alcohol use. He reports that he does not use drugs.   Family History:  The patient's family history is not on file.    ROS:  Please see the history of present illness.  All other systems are reviewed and negative.    PHYSICAL EXAM: VS:  BP 126/67   Pulse 68   Ht 5\' 6"  (1.676 m)   Wt 147 lb 3.2 oz (66.8 kg)   SpO2 98%   BMI 23.76 kg/m  , BMI Body mass index is 23.76 kg/m. GEN: Well nourished, well developed, in no acute distress  HEENT: normal  Neck: no JVD, no masses. No carotid bruits Cardiac: RRR  No gallop. There is a gr 3/6 holo-diastolic murmur LSB to apex.        Respiratory:  clear to auscultation bilaterally, normal work of breathing GI: soft, nontender, nondistended, + BS MS: no deformity or atrophy. There is a large mature bruise in the left shoulder and upper arm. Ext: no pretibial edema, pedal pulses 2+= bilaterally Skin: warm and dry, no rash Neuro:  Strength and sensation are intact Psych: euthymic mood, full affect  EKG:  EKG is  ordered today. NSR rate 68. Possible old anterior infarct. I have personally reviewed and interpreted this study.     Recent Labs: 02/28/2022: BUN 12; Creatinine, Ser 0.96; Hemoglobin 11.2; Platelets 176; Potassium 3.9; Sodium 140   Lipid Panel     Component Value Date/Time   CHOL 117 01/15/2018 0453   TRIG 64 01/15/2018 0453   HDL 41 01/15/2018 0453   CHOLHDL 2.9 01/15/2018 0453   VLDL 13 01/15/2018 0453   LDLCALC 63 01/15/2018 0453   Dated 04/09/18: cholesterol 134, triglycerides 87, HDL 45, LDL 72. CBC, chemistries, TSH normal. Dated 04/21/19: cholesterol 118, triglycerides 66, HDL 40, LDL 65. CMET, CBC, TSH  normal Dated 04/25/20: cholesterol 120, triglycerides 46, HDL 44, LDL 67.  Dated 09/24/20: normal CMET Dated 02/24/22: cholesterol 111,  triglycerides 79, HDL 47, LDL 49. CMET and TSH normal Dated 04/15/22: Hgb 11.2.   Wt Readings from Last 3 Encounters:  05/12/22 147 lb 3.2 oz (66.8 kg)  02/28/22 147 lb 6.4 oz (66.9 kg)  01/09/22 150 lb (68 kg)     Cardiac Studies Reviewed: TEE 02-16-2018: Study Conclusions   - Left ventricle: The cavity size was normal. There was mild   concentric hypertrophy. Systolic function was normal. The   estimated ejection fraction was in the range of 55% to 60%. Wall   motion was normal; there were no regional wall motion   abnormalities. - Aortic valve: Trileaflet. Mild to moderate eccentric AI, 2 jets,   the larger jet is toward the anterior mitral valve leaflet. - Aorta: Dilated sinus of valsalva to 4.1 cm. - Left atrium: Mildly dilated. No evidence of thrombus in the   atrial cavity or appendage. - Right atrium: No evidence of thrombus in the atrial cavity or   appendage. - Atrial septum: There is a small PFO by saline microbubble   contrast which demonstrates intermittent right to left flow.   Impressions:   - Small PFO with intermittent right to left flow noted by saline   microbubble contrast. This can be a possible mechanism for his   TIA. Consider referral to Dr. Excell Seltzerooper with the structural heart   clinic for closure evaluation.  Assessment/Plan:  1. CAD remote PTCA of the LAD in 1995. No recurrent angina. Continue amlodipine and statin. Now on Plavix post Watchman but plan to resume after he comes off Plavix.  2. Paroxysmal Afib. Noted on loop recorder. Marland Kitchen. He is  asymptomatic.  S/p placement of Watchman device. Off anticoagulation now. Did have significant hematuria. Urology work up pending.  3. HLD excellent control on Crestor 4. HTN well controlled.  5. Transient confusion. Negative Neurologic evaluation.  6. 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  Moderate to severe by TEE - asymptomatic. LV size and function normal. Will follow with serial Echo.   I will follow up in 6 months   Signed, Taquila Leys [place], MD  05/12/2022 8:17 AM    [facility] [address], [place], [place]  [address] Phone: [phone]; Fax: [phone]

## 2022-05-12 ENCOUNTER — Ambulatory Visit: Payer: Medicare Other | Attending: Cardiology | Admitting: Cardiology

## 2022-05-12 ENCOUNTER — Encounter: Payer: Self-pay | Admitting: Cardiology

## 2022-05-12 VITALS — BP 126/67 | HR 68 | Ht 66.0 in | Wt 147.2 lb

## 2022-05-12 DIAGNOSIS — I10 Essential (primary) hypertension: Secondary | ICD-10-CM | POA: Diagnosis present

## 2022-05-12 DIAGNOSIS — I2510 Atherosclerotic heart disease of native coronary artery without angina pectoris: Secondary | ICD-10-CM | POA: Insufficient documentation

## 2022-05-12 DIAGNOSIS — E7800 Pure hypercholesterolemia, unspecified: Secondary | ICD-10-CM | POA: Diagnosis not present

## 2022-05-12 DIAGNOSIS — I480 Paroxysmal atrial fibrillation: Secondary | ICD-10-CM | POA: Insufficient documentation

## 2022-05-12 DIAGNOSIS — G459 Transient cerebral ischemic attack, unspecified: Secondary | ICD-10-CM | POA: Diagnosis not present

## 2022-05-13 ENCOUNTER — Telehealth: Payer: Self-pay

## 2022-05-13 NOTE — Telephone Encounter (Signed)
Scheduled the patient for 6 month LAAO visit 07/09/2022.  He was grateful for call and agrees with plan.

## 2022-06-03 ENCOUNTER — Telehealth: Payer: Self-pay

## 2022-06-03 NOTE — Telephone Encounter (Signed)
   Pre-operative Risk Assessment    Patient Name: Edward [name]  DOB: 07-03-1941 MRN: [id number]      Request for Surgical Clearance    Procedure:   colonoscopy  Date of Surgery:  Clearance TBD                           Surgeon:  Dr. Wilford Corner Surgeon's Group or Practice Name:  [facility] Phone number:  [phone] Fax number:  [phone]   Type of Clearance Requested:   - Medical    Type of Anesthesia:   propfol   Additional requests/questions:      Signed, Monia Pouch   06/03/2022, 3:00 PM

## 2022-06-04 NOTE — Telephone Encounter (Signed)
   Patient Name: Edward [name]  DOB: 06/26/1941 MRN: [id number]  Primary Cardiologist: Peter Martinique, MD  Chart reviewed as part of pre-operative protocol coverage.  Spoke with patient who states that he is going to defer colonoscopy at this time given that he is to remain on Plavix therapy uninterrupted until at least the end of November 2023.  He will notify us when he is ready to proceed.  I will route this recommendation to the requesting party via Epic fax function and remove from pre-op pool.  Please call with questions.  Lenna Sciara, NP 06/04/2022, 4:07 PM

## 2022-06-24 ENCOUNTER — Telehealth: Payer: Self-pay | Admitting: Cardiology

## 2022-06-24 ENCOUNTER — Other Ambulatory Visit: Payer: Self-pay | Admitting: Urology

## 2022-06-24 NOTE — Telephone Encounter (Signed)
   Pre-operative Risk Assessment    Patient Name: Edward [name]  DOB: 1940/09/18 MRN: [id number]      Request for Surgical Clearance    Procedure:  urethral dilation  Date of Surgery:  Clearance 07/22/22                                 Surgeon:  Dr. Junious Silk  Surgeon's Group or Practice Name:  [facility] Phone number:  [phone] Fax number:  [phone]   Type of Clearance Requested:   - Medical  - Pharmacy:  Hold Clopidogrel (Plavix) 5 days   Type of Anesthesia:  General    Additional requests/questions:      [name]   06/24/2022, 10:20 AM

## 2022-06-24 NOTE — Telephone Encounter (Signed)
Preoperative team, patient has upcoming cardiology appointment on 07/09/2022.  I will defer recommendations for preoperative cardiac evaluation and Plavix hold to appointment at that time.  Please add preoperative cardiac evaluation to appointment notes.  Thank you for your help.  Jossie Ng. Dim Meisinger NP-C     06/24/2022, [address] Office [phone] Fax [phone]

## 2022-06-24 NOTE — Telephone Encounter (Signed)
Per pre op provider today pt has appt 07/09/22, will defer clearance to provider for appt 07/09/22.

## 2022-07-08 NOTE — Progress Notes (Unsigned)
[facility]                                     Cardiology Office Note:    Date:  07/09/2022   ID:  Edward BeetsVictor X Badders, DOB 02-04-1941, MRN [id number]  PCP:  Chilton GreathouseAvva, Ravisankar, MD  [facility] Cardiologist:  Peter SwazilandJordan, MD  [facility] Electrophysiologist:  None   Referring MD: Chilton GreathouseAvva, Ravisankar, MD   Chief Complaint  Patient presents with   Follow-up    6 month s/p LAAO    History of Present Illness:    Edward BeetsVictor X [name] is a 81 y.o. male with a hx of cryogenic stroke s/p ILR found to have paroxysmal atrial fibrillation who is currently taking Eliquis. Also a hx of CAD s/p angioplasty of the LAD 1995, HLD, and HTN who was referred to Dr. Lalla BrothersLambert 12/06/21 for the evaluation of possible Watchman.    Edward [name] is followed by Dr. SwazilandJordan for his cardiology care. He was noted to have had a TIA in 2018 with recurrent episode 12/2017. His evaluation included an echocardiogram, carotid ultrasound, CT and MRI studies of the brain. All of these were unrevealing. There was no evidence of cortical infarction on his neuroimaging studies.  He had no arrhythmia documented. His TEE demonstrated a small PFO and the patient was seen by Dr. Excell Seltzerooper for consideration of PFO closure however after discussion with Neurology, this was not felt to be indicated. He subsequently had an implantable loop recorder placed by Dr Ladona Ridgelaylor which showed new atrial fibrillation and he was therefore started on Eliquis.    He has a history of CAD with remote angioplasty of the LAD in 1995. He has never had recurrent angina.    Edward [name] saw Dr. Ladona Ridgelaylor 08/14/2021 for atrial fibrillation and IRL follow up and showed interest in Watchman implant to avoid long term exposure to anticoagulation. IRL was nearing end of life and the patient wished to have this removed.    On Dr. Lovena NeighboursLambert's evaluation, plan was to proceed with Watchman workup with CT imagining that showed anatomy suitable for implant and is now s/p implant  with Watchman FLX 27mm device. Medication plan was to stop Eliquis 45 days after implant (02/20/2022) and transition to Plavix 75mg  PO daily for a total of 6 months.     Today he presents and reports he has been doing well despite multiple urologic issues. He is scheduled for a procedure soon which will require him to hold Plavix for 5 days however he will stop Plavix 07/12/22 (6 months of therapy) so this will not be a problem. Otherwise, he denies chest pain, palpitations, LE edema, orthopnea, bleeding, dizziness, or syncope.   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 Lalla BrothersLambert  Spermatocele    left   Stroke [facility])    TIA's   Urgency of urination    Vertigo     Past Surgical History:  Procedure Laterality Date   CORONARY ANGIOPLASTY  1995  DR Swaziland   LAD   CYSTOSCOPY N/A 09/18/2015   Procedure: CYSTOSCOPY;  Surgeon: Jerilee Field, MD;  Location: [facility];  Service: Urology;  Laterality: N/A;   CYSTOSCOPY WITH RETROGRADE PYELOGRAM, URETEROSCOPY AND STENT PLACEMENT Left 03/01/2013   Procedure: CYSTOSCOPY WITH LEFT RETROGRADE PYELOGRAM, LEFT URETEROSCOPY and stent PLACEMENT;  Surgeon: Antony Haste, MD;  Location[facility];  Service: Urology;  Laterality: Left;   FOOT FUSION Left    SECONDARY TO FX'S   GREEN LIGHT LASER TURP (TRANSURETHRAL RESECTION OF PROSTATE N/A 10/04/2013   Procedure: GREEN LIGHT LASER TURP (TRANSURETHRAL RESECTION OF PROSTATE;  Surgeon: Antony Haste, MD;  Location: [facility];  Service: Urology;  Laterality: N/A;   INGUINAL HERNIA REPAIR Bilateral    LEFT ATRIAL APPENDAGE OCCLUSION N/A 01/09/2022   Procedure: LEFT ATRIAL APPENDAGE OCCLUSION;  Surgeon: Lanier Prude, MD;  [facility]: [facility];  Service: Cardiovascular;  Laterality: N/A;   LOOP RECORDER INSERTION N/A 04/20/2018   Procedure: LOOP RECORDER INSERTION;  Surgeon: Marinus Maw, MD;  Location: [facility];  Service: Cardiovascular;  Laterality: N/A;   SHOULDER ARTHROSCOPY Right 2004   SHOULDER ARTHROSCOPY WITH DEBRIDEMENT AND BICEP TENDON REPAIR Left 04-28-2011   SPERMATOCELECTOMY Left 10/04/2013   Procedure: LEFT SPERMATOCELECTOMY;  Surgeon: Antony Haste, MD;  Location: [facility];  Service: Urology;  Laterality: Left;   TEE WITHOUT CARDIOVERSION N/A 02/16/2018   Procedure: TRANSESOPHAGEAL ECHOCARDIOGRAM (TEE);  Surgeon: Chrystie Nose, MD;  Location: [facility];  Service: Cardiovascular;  Laterality: N/A;   TEE WITHOUT CARDIOVERSION N/A 01/09/2022   Procedure: TRANSESOPHAGEAL ECHOCARDIOGRAM (TEE);  Surgeon: Lanier Prude, MD;  Location: [facility] INVASIVE CV LAB;  Service: Cardiovascular;  Laterality: N/A;   TRANSURETHRAL RESECTION OF PROSTATE N/A 12/11/2014   Procedure: CYSTOSCOPY, CLOT EVACUATION, WITH FULGURATION ;  Surgeon: Bjorn Pippin, MD;  Location: [facility];  Service: Urology;  Laterality: N/A;   TRANSURETHRAL RESECTION OF PROSTATE N/A 09/18/2015   Procedure: TRANSURETHRAL RESECTION OF THE PROSTATE (TURP);  Surgeon: Jerilee Field, MD;  Location: [facility];  Service: Urology;  Laterality: N/A;   UMBILICAL HERNIA REPAIR  06-02-2008   UMBILICAL HERNIA REPAIR N/A 11/12/2021   Procedure: UMBILICAL HERNIA REPAIR;  Surgeon: Abigail Miyamoto, MD;  Location: [facility];  Service: General;  Laterality: N/A;  LMA    Current Medications: Current Meds  Medication Sig   acetaminophen (TYLENOL) 500 MG tablet Take 500 mg by mouth every 6 (six) hours as needed for  moderate pain or mild pain.   amLODipine (NORVASC) 5 MG tablet Take 1 tablet (5 mg total) by mouth daily.   aspirin EC 81 MG tablet Take 81 mg by mouth daily. Swallow whole.   budesonide-formoterol (SYMBICORT) 160-4.5 MCG/ACT inhaler Inhale 1 puff into the lungs 2 (two) times daily as needed (chest tightness).   Cephalexin 250 MG tablet Take 500 mg by mouth at bedtime.   Cholecalciferol (VITAMIN D3 PO) Take 1 tablet by mouth every other day.   Coenzyme Q10 (CO Q 10 PO) Take 300 mg by mouth daily at 12 noon.   Cyanocobalamin (VITAMIN B-12 PO) Take 1 tablet by mouth every other day.   losartan (COZAAR) 100 MG tablet Take 1 tablet (100 mg total) by mouth daily.  MAGNESIUM CITRATE PO Take 250 mg by mouth every other day.   Multiple Vitamin (MULTIVITAMIN WITH MINERALS) TABS tablet Take 1 tablet by mouth every other day.   Multiple Vitamins-Minerals (ZINC PO) Take 1 tablet by mouth every other day.   Probiotic Product (PROBIOTIC PO) Take 1 capsule by mouth in the morning.   rosuvastatin (CRESTOR) 20 MG tablet Take 1 tablet (20 mg total) by mouth every evening.   tamsulosin (FLOMAX) 0.4 MG CAPS capsule Take 0.4 mg by mouth at bedtime.   VITAMIN E PO Take 1 tablet by mouth every other day.   [DISCONTINUED] azithromycin (ZITHROMAX) 500 MG tablet Take 1 tablet by mouth 1 hour prior to dental procedures or cleanings   [DISCONTINUED] clopidogrel (PLAVIX) 75 MG tablet Take 1 tablet (75 mg total) by mouth daily.     Allergies:   Augmentin [amoxicillin-pot clavulanate], Codeine, Levaquin [levofloxacin], and Zocor [simvastatin]   Social History   Socioeconomic History   Marital status: Married    Spouse name: Not on file   Number of children: 0   Years of education: Not on file   Highest education level: Not on file  Occupational History   Occupation: Technical sales engineer    Comment: retired  Tobacco Use   Smoking status: Former    Packs/day: 0.50    Years: 3.00    Total pack years: 1.50     Types: Cigarettes    Quit date: 12/18/1970    Years since quitting: 51.5   Smokeless tobacco: Never  Vaping Use   Vaping Use: Never used  Substance and Sexual Activity   Alcohol use: Yes    Comment: OCCASIONAL   Drug use: No   Sexual activity: Not on file  Other Topics Concern   Not on file  Social History Narrative   Not on file   Social Determinants of Health   Financial Resource Strain: Not on file  Food Insecurity: Not on file  Transportation Needs: Not on file  Physical Activity: Not on file  Stress: Not on file  Social Connections: Not on file     Family History: The patient's family history is not on file.  ROS:   Please see the history of present illness.    All other systems reviewed and are negative.  EKGs/Labs/Other Studies Reviewed:    The following studies were reviewed today:  CT cardiac morph/pul 03/10/22: MPRESSION: 1. There is normal pulmonary vein drainage into the left atrium.   2. There is no peri-device leak and there is suitable compression of the Watchman FLX device.   3. There is delayed endothelialization (incomplete endothelialization). Anticoagulation strategy as per heart team approach.   4. Coronary calcium score of 528.    CT cardiac morph/pul 12/19/21:   IMPRESSION: 1.  Wind Sock LAA with no thrombus   2. Landing zone average diameter 23.9 mm suitable for a 27 mm Watchman FLX device   3.  Normal PV anatomy with no anomaly   4.  Elongated atrial septum with no ASD/PFO noted   5.  Normal ascending thoracic aorta 3.6 cm   6.  No pericardial effusion   7.  Moderate bi atrial enlargement   TEE 02/16/2018: Study Conclusions   - Left ventricle: The cavity size was normal. There was mild   concentric hypertrophy. Systolic function was normal. The   estimated ejection fraction was in the range of 55% to 60%. Wall   motion was normal; there were no regional wall motion  abnormalities. - Aortic valve: Trileaflet. Mild to  moderate eccentric AI, 2 jets,   the larger jet is toward the anterior mitral valve leaflet. - Aorta: Dilated sinus of valsalva to 4.1 cm. - Left atrium: Mildly dilated. No evidence of thrombus in the   atrial cavity or appendage. - Right atrium: No evidence of thrombus in the atrial cavity or   appendage. - Atrial septum: There is a small PFO by saline microbubble   contrast which demonstrates intermittent right to left flow.   Impressions:   - Small PFO with intermittent right to left flow noted by saline   microbubble contrast. This can be a possible mechanism for his   TIA. Consider referral to Dr. Excell Seltzer with the structural heart   clinic for closure evaluation.  EKG:  EKG is not ordered today.    Recent Labs: 02/28/2022: BUN 12; Creatinine, Ser 0.96; Hemoglobin 11.2; Platelets 176; Potassium 3.9; Sodium 140   Recent Lipid Panel    Component Value Date/Time   CHOL 117 01/15/2018 0453   TRIG 64 01/15/2018 0453   HDL 41 01/15/2018 0453   CHOLHDL 2.9 01/15/2018 0453   VLDL 13 01/15/2018 0453   LDLCALC 63 01/15/2018 0453   Physical Exam:    VS:  BP 120/60 (BP Location: Left Arm, Patient Position: Sitting, Cuff Size: Normal)   Pulse 62   Ht 5\' 6"  (1.676 m)   Wt 146 lb (66.2 kg)   SpO2 99%   BMI 23.57 kg/m     Wt Readings from Last 3 Encounters:  07/09/22 146 lb (66.2 kg)  05/12/22 147 lb 3.2 oz (66.8 kg)  02/28/22 147 lb 6.4 oz (66.9 kg)     General: Well developed, well nourished, NAD Lungs:Clear to ausculation bilaterally. No wheezes, rales, or rhonchi. Breathing is unlabored. Cardiovascular: RRR with S1 S2. + murmurs Extremities: No edema.  Neuro: Alert and oriented. No focal deficits. No facial asymmetry. MAE spontaneously. Psych: Responds to questions appropriately with normal affect.    ASSESSMENT/PLAN:    PAF: Patient is now s/p implant with Watchman FLX 33mm device. Medication plan was to stop Eliquis 45 days after implant (02/20/2022) and transition to  Plavix 75mg  PO daily for 6 months (07/12/22. He no longer will require Plavix after this time. He will restart ASA due to underlying CAD per Dr. request. He is stable from a CV standpoint to proceed with urologic procedure. He no longer will require dental SBE.    Hx of CVA: No deficits. Continue current regimen.    CAD: s/p LAD angioplasty 1995 with no anginal symptoms. Continue statin. Now that he no longer requires AC, will start ASA 81mg  QD. No beta blocker given asymptomatic bradycardia.    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 Noted to have moderate AR on last echocardiogram from 12/16/21 with two separate jets. Continue to follow with surveillance imaging. Discussed TAVR with the patient due to his questioning. Sees Dr. 1996 10/2022.    HTN: Stable, no medication changes.   HLD: Last LDL, 63 on 01/15/18. Continue Crestor.  Medication Adjustments/Labs and Tests Ordered: Current medicines are reviewed at length with the patient today.  Concerns regarding medicines are outlined above.  No orders of the defined types were placed in this encounter.  No orders of the defined types were placed in this encounter.   Patient Instructions  Medication Instructions:   Stop  Plavix 11-25  Start  Aspirin  81 11-26   *If you need a refill  on your cardiac medications before your next appointment, please call your pharmacy*   Lab Work:  NONE ORDERED  TODAY    If you have labs (blood work) drawn today and your tests are completely normal, you will receive your results only by: MyChart Message (if you have MyChart) OR A paper copy in the mail If you have any lab test that is abnormal or we need to change your treatment, we will call you to review the results.   Testing/Procedures: NONE ORDERED  TODAY     Follow-Up: At [facility], you and your health needs are our priority.  As part of our continuing mission to provide you with exceptional heart care, we have  created designated Provider Care Teams.  These Care Teams include your primary Cardiologist (physician) and Advanced Practice Providers (APPs -  Physician Assistants and Nurse Practitioners) who all work together to provide you with the care you need, when you need it.  We recommend signing up for the patient portal called "MyChart".  Sign up information is provided on this After Visit Summary.  MyChart is used to connect with patients for Virtual Visits (Telemedicine).  Patients are able to view lab/test results, encounter notes, upcoming appointments, etc.  Non-urgent messages can be sent to your provider as well.   To learn more about what you can do with MyChart, go to [email or web address].    Your next appointment:  you will be ocntacted back for appointment  The format for your next appointment:   In Person  Provider:   Georgie Chard, NP or Carlean Jews, PA-C   Other Instructions   Important Information About Sugar         Signed, Georgie Chard, NP  07/09/2022 3:50 PM    [facility]

## 2022-07-09 ENCOUNTER — Ambulatory Visit: Payer: Medicare Other

## 2022-07-09 ENCOUNTER — Telehealth: Payer: Self-pay

## 2022-07-09 ENCOUNTER — Ambulatory Visit: Payer: Medicare Other | Attending: Cardiology | Admitting: Cardiology

## 2022-07-09 VITALS — BP 120/60 | HR 62 | Ht 66.0 in | Wt 146.0 lb

## 2022-07-09 DIAGNOSIS — I350 Nonrheumatic aortic (valve) stenosis: Secondary | ICD-10-CM | POA: Diagnosis present

## 2022-07-09 DIAGNOSIS — Z01818 Encounter for other preprocedural examination: Secondary | ICD-10-CM | POA: Diagnosis present

## 2022-07-09 DIAGNOSIS — I2510 Atherosclerotic heart disease of native coronary artery without angina pectoris: Secondary | ICD-10-CM | POA: Insufficient documentation

## 2022-07-09 DIAGNOSIS — E7800 Pure hypercholesterolemia, unspecified: Secondary | ICD-10-CM | POA: Insufficient documentation

## 2022-07-09 DIAGNOSIS — I480 Paroxysmal atrial fibrillation: Secondary | ICD-10-CM | POA: Diagnosis present

## 2022-07-09 DIAGNOSIS — I10 Essential (primary) hypertension: Secondary | ICD-10-CM | POA: Insufficient documentation

## 2022-07-09 DIAGNOSIS — Z95818 Presence of other cardiac implants and grafts: Secondary | ICD-10-CM | POA: Insufficient documentation

## 2022-07-09 NOTE — Patient Instructions (Addendum)
Medication Instructions:   Stop  Plavix 11-25  Start  Aspirin  81 11-26   *If you need a refill on your cardiac medications before your next appointment, please call your pharmacy*   Lab Work:  NONE ORDERED  TODAY    If you have labs (blood work) drawn today and your tests are completely normal, you will receive your results only by: MyChart Message (if you have MyChart) OR A paper copy in the mail If you have any lab test that is abnormal or we need to change your treatment, we will call you to review the results.   Testing/Procedures: NONE ORDERED  TODAY     Follow-Up: At [facility], you and your health needs are our priority.  As part of our continuing mission to provide you with exceptional heart care, we have created designated Provider Care Teams.  These Care Teams include your primary Cardiologist (physician) and Advanced Practice Providers (APPs -  Physician Assistants and Nurse Practitioners) who all work together to provide you with the care you need, when you need it.  We recommend signing up for the patient portal called "MyChart".  Sign up information is provided on this After Visit Summary.  MyChart is used to connect with patients for Virtual Visits (Telemedicine).  Patients are able to view lab/test results, encounter notes, upcoming appointments, etc.  Non-urgent messages can be sent to your provider as well.   To learn more about what you can do with MyChart, go to [email or web address].    Your next appointment:  you will be ocntacted back for appointment  The format for your next appointment:   In Person  Provider:   Georgie Chard, NP or Carlean Jews, PA-C   Other Instructions   Important Information About Sugar

## 2022-07-09 NOTE — Telephone Encounter (Signed)
   Pre-operative Risk Assessment    Patient Name: Edward [name]  DOB: 1941-05-20 MRN: [id number]      Request for Surgical Clearance    Procedure:   Colonoscopy   Date of Surgery:  Clearance 08/13/22                                 Surgeon:  Dr. Bosie Clos  Surgeon's Group or Practice Name:  [facility]  Phone number:  [phone] Fax number:  [phone]   Type of Clearance Requested:   - Medical    Type of Anesthesia:  Not Indicated   Additional requests/questions:    SignedDorris Fetch   07/09/2022, 1:51 PM

## 2022-07-12 NOTE — Patient Instructions (Signed)
SURGICAL WAITING ROOM VISITATION Patients having surgery or a procedure may have no more than 2 support people in the waiting area - these visitors may rotate in the visitor waiting room.   Children under the age of 54 must have an adult with them who is not the patient. If the patient needs to stay at the hospital during part of their recovery, the visitor guidelines for inpatient rooms apply.  PRE-OP VISITATION  Pre-op nurse will coordinate an appropriate time for 1 support person to accompany the patient in pre-op.  This support person may not rotate.  This visitor will be contacted when the time is appropriate for the visitor to come back in the pre-op area.  Please refer to the [facility] website for the visitor guidelines for Inpatients (after your surgery is over and you are in a regular room).  You are not required to quarantine at this time prior to your surgery. However, you must do this: Hand Hygiene often Do NOT share personal items Notify your provider if you are in close contact with someone who has COVID or you develop fever 100.4 or greater, new onset of sneezing, cough, sore throat, shortness of breath or body aches.  If you test positive for Covid or have been in contact with anyone that has tested positive in the last 10 days please notify you surgeon.    Your procedure is scheduled on:  Tuesday  July 22, 2022  Report to [facility] Main Entrance: [address] entrance where the [employer] is available.   Report to admitting at: 07:00 AM  +++++Call this number if you have any questions or problems the morning of surgery [phone]  DO NOT EAT OR DRINK ANYTHING AFTER MIDNIGHT THE NIGHT PRIOR TO YOUR SURGERY / PROCEDURE.   FOLLOW ANY ADDITIONAL PRE OP INSTRUCTIONS YOU RECEIVED FROM YOUR SURGEON'S OFFICE!!!   Oral Hygiene is also important to reduce your risk of infection.        Remember - BRUSH YOUR TEETH THE MORNING OF SURGERY WITH YOUR REGULAR  TOOTHPASTE   Take ONLY these medicines the morning of surgery with A SIP OF WATER: amlodipine, You may take Tylenol and use your Symbicort inhaler if needed.  If You have been diagnosed with Sleep Apnea - Bring CPAP mask and tubing day of surgery. We will provide you with a CPAP machine on the day of your surgery.                   You may not have any metal on your body including  jewelry, and body piercing  Do not wear lotions, powders, cologne, or deodorant  Men may shave face and neck.  Contacts, Hearing Aids, dentures or bridgework may not be worn into surgery.   Patients discharged on the day of surgery will not be allowed to drive home.  Someone NEEDS to stay with you for the first 24 hours after anesthesia.  Do not bring your home medications to the hospital. The Pharmacy will dispense medications listed on your medication list to you during your admission in the Hospital.   Please read over the following fact sheets you were given: IF YOU HAVE QUESTIONS ABOUT YOUR PRE-OP INSTRUCTIONS, PLEASE CALL [phone]  (KAY)   [place] - Preparing for Surgery Before surgery, you can play an important role.  Because skin is not sterile, your skin needs to be as free of germs as possible.  You can reduce the number of germs on  your skin by washing with CHG (chlorahexidine gluconate) soap before surgery.  CHG is an antiseptic cleaner which kills germs and bonds with the skin to continue killing germs even after washing. Please DO NOT use if you have an allergy to CHG or antibacterial soaps.  If your skin becomes reddened/irritated stop using the CHG and inform your nurse when you arrive at Short Stay. Do not shave (including legs and underarms) for at least 48 hours prior to the first CHG shower.  You may shave your face/neck.  Please follow these instructions carefully:  1.  Shower with CHG Soap the night before surgery and the  morning of surgery.  2.  If you choose to wash your hair,  wash your hair first as usual with your normal  shampoo.  3.  After you shampoo, rinse your hair and body thoroughly to remove the shampoo.                             4.  Use CHG as you would any other liquid soap.  You can apply chg directly to the skin and wash.  Gently with a scrungie or clean washcloth.  5.  Apply the CHG Soap to your body ONLY FROM THE NECK DOWN.   Do not use on face/ open                           Wound or open sores. Avoid contact with eyes, ears mouth and genitals (private parts).                       Wash face,  Genitals (private parts) with your normal soap.             6.  Wash thoroughly, paying special attention to the area where your  surgery  will be performed.  7.  Thoroughly rinse your body with warm water from the neck down.  8.  DO NOT shower/wash with your normal soap after using and rinsing off the CHG Soap.            9.  Pat yourself dry with a clean towel.            10.  Wear clean pajamas.            11.  Place clean sheets on your bed the night of your first shower and do not  sleep with pets.  ON THE DAY OF SURGERY : Do not apply any lotions/deodorants the morning of surgery.  Please wear clean clothes to the hospital/surgery center.    FAILURE TO FOLLOW THESE INSTRUCTIONS MAY RESULT IN THE CANCELLATION OF YOUR SURGERY  PATIENT SIGNATURE_________________________________  NURSE SIGNATURE__________________________________  ________________________________________________________________________

## 2022-07-12 NOTE — Progress Notes (Addendum)
COVID Vaccine received:  _0  No _1  Yes Date of any COVID positive Test in last 90 days: none  PCP - Prince Solian, MD Cardiologist - Geanie Cooley   Kathyrn Drown, NP- cardiac clearance 07-09-22 Epic note EP- Lars Mage MD  Chest x-ray - 01-10-2022 EKG -  05-12-2022 Stress Test -  ECHO -TEE  01-09-2022 Cardiac Cath - 1995  Dr. Martinique   PCR screen: _2  Ordered & Completed                      _3   No Order but Needs PROFEND                      _4   N/A for this surgery  Surgery Plan:  _5  Ambulatory                            _6  Outpatient in bed                            _7  Admit  Anesthesia:    _8  General  _9  Spinal                           _10   Choice _11   MAC  Pacemaker / ICD device _12  No _13  Yes        Device order form faxed _14  No    _15   Yes      Faxed to:  Other Implants: Watchman device 12-2021,                    History of Sleep Apnea? _16  No _17  Yes   CPAP used?- _18  No _19  Yes   Past history of STOPBANG 4, Patient has lost weight and today's score was 3.  Does the patient monitor blood sugar? _20  No _21  Yes  _22  N/A  Blood Thinner / Instructions: ASA 81 mg, (Recently stopped Plavix after 6 months post Watchman) Aspirin Instructions: stop 7 days prior to DOS, Last dose was Monday 07-14-2022  ERAS Protocol Ordered: _23  No  _24  Yes Patient is to be NPO after: Midnight prior  Comments: Had [employer] Loop Recorder but it was removed [phone]  Activity level: Patient can climb a flight of stairs without difficulty; _25  No CP  _26  No SOB   Patient can perform ADLs without assistance.   Anesthesia review: CAD (PCI 1995), A. Fib- Has Watchman device 12-2021, Hx TIAs, HTN, small PFO,   Patient denies shortness of breath, fever, cough and chest pain at PAT appointment.  Patient verbalized understanding and agreement to the Pre-Surgical Instructions that were given to them at this PAT appointment. Patient was also educated of the need to review these PAT  instructions again prior to his/her surgery.I reviewed the appropriate phone numbers to call if they have any and questions or concerns.

## 2022-07-14 ENCOUNTER — Other Ambulatory Visit (INDEPENDENT_AMBULATORY_CARE_PROVIDER_SITE_OTHER): Payer: Medicare Other | Admitting: Cardiology

## 2022-07-14 DIAGNOSIS — I2510 Atherosclerotic heart disease of native coronary artery without angina pectoris: Secondary | ICD-10-CM | POA: Diagnosis not present

## 2022-07-14 DIAGNOSIS — Z95818 Presence of other cardiac implants and grafts: Secondary | ICD-10-CM

## 2022-07-14 NOTE — Telephone Encounter (Signed)
   Name: Edward [name]  DOB: 1941/05/02  MRN: [id number]   Primary Cardiologist: Peter Swazil[place], MD  Chart reviewed as part of pre-operative protocol coverage.  Edward [name] was last seen on 07/09/22 by Georgie Chard NP.  Pt is status post watchman device and has completed eliquis treatment. He has also completed plavix therapy and remains on ASA monotherapy. He was deemed acceptable risk to proceed with upcoming urological procedure during his recent office visit. Given this recent evaluation, it is reasonable to proceed with colonoscopy.  Given the patient's comorbid conditions, we prefer to continue ASA throughout the perioperative period. However, if doing so significantly increases morbidity or mortality, may hold for 5-7 days.  Therefore, based on ACC/AHA guidelines, the patient would be at acceptable risk for the planned procedure without further cardiovascular testing.   I will route this recommendation to the requesting party via Epic fax function and remove from pre-op pool. Please call with questions.  [name], PA 07/14/2022, 8:23 AM

## 2022-07-15 ENCOUNTER — Encounter (HOSPITAL_COMMUNITY)
Admission: RE | Admit: 2022-07-15 | Discharge: 2022-07-15 | Disposition: A | Discharge status: Home / Self Care | Payer: Medicare Other | Source: Ambulatory Visit | Attending: Urology | Admitting: Urology

## 2022-07-15 ENCOUNTER — Encounter (HOSPITAL_COMMUNITY): Payer: Self-pay

## 2022-07-15 ENCOUNTER — Other Ambulatory Visit: Payer: Self-pay

## 2022-07-15 VITALS — BP 122/61 | HR 72 | Temp 97.8°F | Resp 16 | Ht 66.0 in | Wt 143.0 lb

## 2022-07-15 DIAGNOSIS — Z01818 Encounter for other preprocedural examination: Secondary | ICD-10-CM | POA: Insufficient documentation

## 2022-07-15 DIAGNOSIS — I10 Essential (primary) hypertension: Secondary | ICD-10-CM | POA: Insufficient documentation

## 2022-07-15 LAB — BASIC METABOLIC PANEL
Anion gap: 10 (ref 5–15)
BUN: 12 mg/dL (ref 8–23)
CO2: 23 mmol/L (ref 22–32)
Calcium: 9.6 mg/dL (ref 8.9–10.3)
Chloride: 106 mmol/L (ref 98–111)
Creatinine, Ser: 0.95 mg/dL (ref 0.61–1.24)
GFR, Estimated: >60 mL/min (ref >60)
Glucose, Bld: 97 mg/dL (ref 70–99)
Potassium: 4.1 mmol/L (ref 3.5–5.1)
Sodium: 139 mmol/L (ref 135–145)

## 2022-07-15 LAB — CBC
HCT: 39.7 % (ref 39.0–52.0)
Hemoglobin: 12.8 g/dL — ABNORMAL LOW (ref 13.0–17.0)
MCH: 30.5 pg (ref 26.0–34.0)
MCHC: 32.2 g/dL (ref 30.0–36.0)
MCV: 94.7 fL (ref 80.0–100.0)
Platelets: 191 10*3/uL (ref 150–400)
RBC: 4.19 MIL/uL — ABNORMAL LOW (ref 4.22–5.81)
RDW: 13.1 % (ref 11.5–15.5)
WBC: 7 10*3/uL (ref 4.0–10.5)
nRBC: 0 % (ref 0.0–0.2)

## 2022-07-16 NOTE — Anesthesia Preprocedure Evaluation (Addendum)
Anesthesia Evaluation  Patient identified by MRN, date of birth, ID band Patient awake    Reviewed: Allergy & Precautions, H&P , NPO status , Patient's Chart, lab work & pertinent test results  Airway Mallampati: II  TM Distance: >3 FB Neck ROM: Full    Dental  (+) Dental Advisory Given, Teeth Intact   Pulmonary former smoker   Pulmonary exam normal breath sounds clear to auscultation       Cardiovascular hypertension, Pt. on medications + CAD  + dysrhythmias Atrial Fibrillation + Valvular Problems/Murmurs AI  Rhythm:Regular Rate:Normal + Diastolic murmurs Echo 01/09/2022 1. Interventional TEE for LAA-O Procedure.   2. Prior to procedure, no left atrial appendage thrombus was noted. Maximal diameter 21 mm, mean diameter 18 mm.   3. Mid- Mid transeptal puncture with no issues.   4. Initial deployment of a 27 mm Watchman FLX device with moderate mitral shoulder and slight leak, re-captured and redeployed.   5. Final deployment- 27 mm Watchman device. No peri-device leak. Small mitral shoulder. Mean device diamter 2.24 cm; 16% compressed.   6. Post procedure left to right shunting from transeptal puncture. No significant pericardial effusion.   7. Left ventricular ejection fraction, by estimation, is 50%. The left ventricle has low normal function.   8. Right ventricular systolic function is normal. The right ventricular size is normal.   9. Aortic dilatation noted. 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  10. The aortic valve is tricuspid. 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 There is near holo-diastolic flow reversal in the ascending aorta. There are multiple jets. Pressure half time of largest jet 286 msec.  11. The mitral valve is normal in structure. Mild mitral valve    Echo 12/16/2021  1. The aortic valve is tricuspid. Aortic valve regurgitation is at least moderate- there are two  separate jets.   2. Left ventricular ejection fraction, by estimation, is 55 to 60%. Left ventricular ejection fraction by 3D volume is 57 %. The left ventricle has normal function. The left ventricle has no regional wall motion abnormalities. Left ventricular diastolic  parameters are consistent with Grade I diastolic dysfunction (impaired relaxation).   3. Right ventricular systolic function is normal. The right ventricular size is normal. There is normal pulmonary artery systolic pressure. The estimated right ventricular systolic pressure is 23.8 mmHg.   4. The mitral valve is normal in structure. Mild mitral valve regurgitation. Moderate mitral annular calcification.       Neuro/Psych TIACVA, No Residual Symptoms    GI/Hepatic negative GI ROS, Neg liver ROS,,,  Endo/Other  negative endocrine ROS    Renal/GU negative Renal ROS     Musculoskeletal  (+) Arthritis ,    Abdominal   Peds  Hematology negative hematology ROS (+)   Anesthesia Other Findings   Reproductive/Obstetrics                              Anesthesia Physical Anesthesia Plan  ASA: 4  Anesthesia Plan: General   Post-op Pain Management: Tylenol PO (pre-op)*   Induction: Intravenous  PONV Risk Score and Plan: 2 and Ondansetron, Dexamethasone and Treatment may vary due to age or medical condition  Airway Management Planned: LMA  Additional Equipment: None  Intra-op Plan:   Post-operative Plan: Extubation in OR  Informed Consent: I have reviewed the patients History and Physical, chart, labs and discussed the procedure including the risks, benefits  and alternatives for the proposed anesthesia with the patient or authorized representative who has indicated his/her understanding and acceptance.     Dental advisory given  Plan Discussed with: CRNA  Anesthesia Plan Comments: (See PAT note 07/15/2022)         Anesthesia Quick Evaluation

## 2022-07-16 NOTE — Progress Notes (Signed)
Anesthesia Chart Review   Case: [id number] Date/Time: 07/22/22 0900   Procedure: OPTILUME BALLOON DILATION - 60 MINS   Anesthesia type: General   Pre-op diagnosis: URETHRAL STRICTURE   Location: [facility]   Surgeons: Jerilee Field, MD       DISCUSSION:81 y.o. former smoker with h/o HTN, CAD s/p angioplasty of the LAD 1995, a-fib s/p implant with Watchman FLX 5mm device, urethral stricture scheduled for above procedure 07/22/2022 with Dr. Lindajo Royal.   Per cardiology preoperative evaluation 07/14/2022, "Chart reviewed as part of pre-operative protocol coverage.  Edward [name] was last seen on 07/09/22 by Georgie Chard NP.  Pt is status post watchman device and has completed eliquis treatment. He has also completed plavix therapy and remains on ASA monotherapy. He was deemed acceptable risk to proceed with upcoming urological procedure during his recent office visit. Given this recent evaluation, it is reasonable to proceed with colonoscopy.   Given the patient's comorbid conditions, we prefer to continue ASA throughout the perioperative period. However, if doing so significantly increases morbidity or mortality, may hold for 5-7 days.   Therefore, based on ACC/AHA guidelines, the patient would be at acceptable risk for the planned procedure without further cardiovascular testing."  Anticipate pt can proceed with planned procedure barring acute status change.   VS: BP 122/61 Comment: repeat  BP,  right arm sitting  Pulse 72   Temp 36.6 C (Oral)   Resp 16   Ht 5\' 6"  (1.676 m)   Wt 64.9 kg   SpO2 99%   BMI 23.08 kg/m   PROVIDERS: , MD is PCP   Cardiologist - [name]    LABS: Labs reviewed: Acceptable for surgery. (all labs ordered are listed, but only abnormal results are displayed)  Labs Reviewed  CBC - Abnormal; Notable for the following components:      Result Value   RBC 4.19 (*)    Hemoglobin 12.8 (*)    All other  components within normal limits  BASIC METABOLIC PANEL     IMAGES:   EKG:   CV: Echo 01/09/2022 1. Interventional TEE for LAA-O Procedure.   2. Prior to procedure, no left atrial appendage thrombus was noted.  Maximal diameter 21 mm, mean diameter 18 mm.   3. Mid- Mid transeptal puncture with no issues.   4. Initial deployment of a 27 mm Watchman FLX device with moderate mitral  shoulder and slight leak, re-captured and redeployed.   5. Final deployment- 27 mm Watchman device. No peri-device leak. Small  mitral shoulder. Mean device diamter 2.24 cm; 16% compressed.   6. Post procedure left to right shunting from transeptal puncture. No  significant pericardial effusion.   7. Left ventricular ejection fraction, by estimation, is 50%. The left  ventricle has low normal function.   8. Right ventricular systolic function is normal. The right ventricular  size is normal.   9. Aortic dilatation noted. There is moderate dilatation of the sinus of  Valsalva, measuring 45 mm.  10. The aortic valve is tricuspid. Aortic valve regurgitation is moderate  to severe. There is near holo-diastolic flow reversal in the ascending  aorta. There are multiple jets. Pressure half time of largest jet 286  msec.  11. The mitral valve is normal in structure. Mild mitral valve  Past Medical History:  Diagnosis Date   Arthritis    right hand,  left foot   At risk for sleep apnea    STOP-BANG =  4    SENT TO PCP 02-10-215   BPH (benign prostatic hypertrophy)    Coronary artery disease CARIOLOGIST-- DR Donnie Aho   ANGIOPLASTY TO LAD   COVID 2022   mild case   Dysrhythmia    A-fib   Elevated PSA    Frequency of urination    History of kidney stones    Hyperlipidemia    Hypertension    Nocturia    Presence of Watchman left atrial appendage closure device 01/09/2022   Watchman FLX 63mm performed by Dr. Lalla Brothers   Spermatocele    left   Stroke [facility])    TIA's   Urgency of urination    Vertigo      Past Surgical History:  Procedure Laterality Date   CORONARY ANGIOPLASTY  1995  DR [place]   LAD   CYSTOSCOPY N/A 09/18/2015   Procedure: CYSTOSCOPY;  Surgeon: Jerilee Field, MD;  Location: [facility];  Service: Urology;  Laterality: N/A;   CYSTOSCOPY WITH RETROGRADE PYELOGRAM, URETEROSCOPY AND STENT PLACEMENT Left 03/01/2013   Procedure: CYSTOSCOPY WITH LEFT RETROGRADE PYELOGRAM, LEFT URETEROSCOPY and stent PLACEMENT;  Surgeon: Antony Haste, MD;  [facility];  Service: Urology;  Laterality: Left;   EYE SURGERY Left    cataract surgery   FOOT FUSION Left    SECONDARY TO FX'S   GREEN LIGHT LASER TURP (TRANSURETHRAL RESECTION OF PROSTATE N/A 10/04/2013   Procedure: GREEN LIGHT LASER TURP (TRANSURETHRAL RESECTION OF PROSTATE;  Surgeon: Antony Haste, MD;  Location: [facility];  Service: Urology;  Laterality: N/A;   INGUINAL HERNIA REPAIR Bilateral    LEFT ATRIAL APPENDAGE OCCLUSION N/A 01/09/2022   Procedure: LEFT ATRIAL APPENDAGE OCCLUSION;  Surgeon: Lanier Prude, MD;  Location: [facility];  Service: Cardiovascular;  Laterality: N/A;   LOOP RECORDER INSERTION N/A 04/20/2018   Procedure: LOOP RECORDER INSERTION;  Surgeon: Marinus Maw, MD;  Location: [facility];  Service: Cardiovascular;  Laterality: N/A;   SHOULDER ARTHROSCOPY Right 08/18/2002   SHOULDER ARTHROSCOPY WITH DEBRIDEMENT AND BICEP TENDON REPAIR Left 04/28/2011   SPERMATOCELECTOMY Left 10/04/2013   Procedure: LEFT SPERMATOCELECTOMY;  Surgeon: Antony Haste, MD;  Location: [facility];  Service: Urology;  Laterality: Left;   TEE WITHOUT CARDIOVERSION N/A 02/16/2018   Procedure: TRANSESOPHAGEAL ECHOCARDIOGRAM (TEE);  Surgeon: Chrystie Nose, MD;  Location: [facility] ENDOSCOPY;  Service: Cardiovascular;  Laterality: N/A;   TEE WITHOUT CARDIOVERSION N/A 01/09/2022   Procedure: TRANSESOPHAGEAL ECHOCARDIOGRAM (TEE);  Surgeon: Lanier Prude, MD;  Location: [facility]  INVASIVE CV LAB;  Service: Cardiovascular;  Laterality: N/A;   TRANSURETHRAL RESECTION OF PROSTATE N/A 12/11/2014   Procedure: CYSTOSCOPY, CLOT EVACUATION, WITH FULGURATION ;  Surgeon: Bjorn Pippin, MD;  Location: [facility];  Service: Urology;  Laterality: N/A;   TRANSURETHRAL RESECTION OF PROSTATE N/A 09/18/2015   Procedure: TRANSURETHRAL RESECTION OF THE PROSTATE (TURP);  Surgeon: Jerilee Field, MD;  Location: [facility];  Service: Urology;  Laterality: N/A;   UMBILICAL HERNIA REPAIR  06/02/2008   UMBILICAL HERNIA REPAIR N/A 11/12/2021   Procedure: UMBILICAL HERNIA REPAIR;  Surgeon: Abigail Miyamoto, MD;  Location: [facility];  Service: General;  Laterality: N/A;  LMA    MEDICATIONS:  acetaminophen (TYLENOL) 500 MG tablet   amLODipine (NORVASC) 5 MG tablet   aspirin EC 81 MG tablet   budesonide-formoterol (SYMBICORT) 160-4.5 MCG/ACT inhaler   Cephalexin 250 MG tablet   Cholecalciferol (VITAMIN D3 PO)   Coenzyme Q10 (CO Q 10 PO)   Cyanocobalamin (VITAMIN  B-12 PO)   losartan (COZAAR) 100 MG tablet   MAGNESIUM CITRATE PO   Multiple Vitamin (MULTIVITAMIN WITH MINERALS) TABS tablet   Multiple Vitamins-Minerals (ZINC PO)   Probiotic Product (PROBIOTIC PO)   rosuvastatin (CRESTOR) 20 MG tablet   tamsulosin (FLOMAX) 0.4 MG CAPS capsule   VITAMIN E PO   No current facility-administered medications for this encounter.     Edward Cipro Ward, PA-C [facility] [phone]

## 2022-07-20 NOTE — H&P (Signed)
Office Visit Report     07/16/2022   --------------------------------------------------------------------------------   Edward [name]  [id number]  DOB: 22-Jul-1941, 81 year old Male  SSN: [id number]   PRIMARY CARE:  Montez Morita, MD  PRIMARY CARE FAX:  [phone]  REFERRING:  Daine Gravel, NP  PROVIDER:  Festus Aloe, M.D.  LOCATION:  [facility] [phone]     --------------------------------------------------------------------------------   CC/HPI: F/u -   1) BPH - since 2012. GLPVP in 2016 and TURP in 2017. AUASS = 1, pleased. H/o urethral sx. Voiding with a good flow and PVR 7-13 ml.   Stricture was dilated Jan 2020 to eval for hematuria. Well resected prostatic fossa noted. AUASS was 10 -15. He is voiding adequately. PVR 000 ml. PVR 004 ml and 13 ml. He asked about PUL or WVT. Restarted tamsulosin.   AUASS = 15-16. He asked about PAE and PUL. Anything without a foley.    2) PSA elevation - PSA runs in the 8-12 range. He had biopsies done in 2005, 2009 and 2010 --> 85 g then 59 gram prostate. His 04/20 PSA was 6.21 Dr. Dagmar Hait had it at 5.4 in Sep 2020. PSA was 7.9 10/20. PSA 10/21 6.94 with nl DRE. His 04/22 PSA is 7.4.   His May 2023 PSA is lower at 6.27    3) MH - UA 10/21 with > 60 rbcs. No gross heme. CT of his abdomen and pelvis with contrast in 04/28/17, which showed non obstructing right renal stones. Prostate showed TURP defect, but no other gross abnormalities were noted. Renal US today, 12/21, benign. Bilateral stones, large prostate. He passed a stone since last OV and UA is clear. Cysto 10/21 benign.    4) kidney stones - URS in the past. 2016 KUB with Aug 2016 - KUB - normal. Maybe some small stones in the kidneys. Stable phleboliths. CT 2018 with bilat stones.    He passed a stone 10/21. [place] with bilat stones 12/21. He passed two stones. CT A/P done 02/22 with a 3 mm RMP stone and a 5 mm LLP stone. KUB 04/22, with the same - 3 mm  RMP stone and a 5 mm LLP stone   KUB with stable 5 mm LLP stones.    5) urethral stricture - Dilated Jul 2016, Jan 2016 and in office Jan 2017. He did CIC up to 2018-2019.   Today, he is seen for the above. Prostate [place] = prostate 87 grams in Aug 2023 and cysto benign but he had an 8Fr penile stricture. He was scheduled for Optilume balloon of urethral sx but this was delayed due to Watchman procedure. Now off blood thinner. His stream is weak. No dysuria or gross hematuria. He started nightly cephalexin Aug 2023 and continues.     ALLERGIES: Cipro TABS - pt does not think he is allergic to cipro Codeine Zocor - body aches    MEDICATIONS: Plavix 75 mg tablet  Tamsulosin Hcl 0.4 mg capsule 1 capsule PO Q HS  Amlodipine Besylate 5 mg tablet  Co Q10  Losartan Potassium 100 mg tablet  Rosuvastatin Calcium 20 mg tablet     GU PSH: Cysto Dilate Stricture (M or F) - 2020, 2017 Cystoscopy - 04/03/2022, 08/14/2020 Cystoscopy Fulguration - 2016 Cystoscopy Insert Stent - 2014 Cystoscopy Irrigate Clot - 2016 Laser Surgery Prostate - 2015, 2015 Removal of spermatocele - 2015 Remove Prostate Regrowth - 2017 Rpr Umbil Hern; Reduc < 5 Yr - 2010 Ureteroscopic stone  removal - 2014       PSH Notes: Transuret Resect Of Regrow Obstr Tis Longer Than 1 Yr Postop, Cystoscopy For Urethral Stricture, Cystourethroscopy With Irrigation And Evacuation Of Clots, Cystoscopy With Fulguration, Surgery Excision Of Spermatocele With Epididymectomy Left, Laser Vaporization, Laser Vaporization With Transurethral Resection Of Prostate, Cystoscopy With Ureteroscopy With Removal Of Calculus, Cystoscopy With Insertion Of Ureteral Stent Left, Shoulder Surgery Left, Umbilical Hernia Repair, Inguinal Hernia Repair, Shoulder Surgery, Foot Surgery   NON-GU PSH: No Non-GU PSH    GU PMH: Acute Cystitis/UTI - 04/15/2022, - 2022 Gross hematuria - 04/15/2022, (Stable), - 03/17/2022, - 2020 (Stable), - 2019, Gross hematuria, -  2017 Renal calculus - 04/15/2022, (Stable), - 03/17/2022, - 01/02/2022, - 2022, - 2022, - 2022, - 2022, check KUB in 4 mo , - 08/14/2020, Bilateral kidney stones, - 2016 BPH w/LUTS, cont tamsulosin - 04/03/2022, We discussed the nature r/b of surveillance, alpha blocker, 5ari, daily pdei, OAB meds and procedures such as TURP, LVP, LEP, RWJ, RSLP in the OR or PUL or WVT in the office. Also discussed PAE - not standard of care for AUA> Check [place] and cystoscopy. tams refilled , - 01/02/2022, On tams. , - 2022, disc nature r/b of alpha, 5ari, supplements, WVT, and PUL. Disc cysto for MH eval and BPH eval and again he declines. Will start tams QHS. , - 08/14/2020, He asked about PUL or RFWVT if "he ever needed it". He remembers some bleeding p KTP laser and wouldn't do "anything like that". Discussed the procedures and discussed cystoscopy. He will consider. , - 2021, Benign localized prostatic hyperplasia with lower urinary tract symptoms (LUTS), - 2017, Benign prostatic hyperplasia with urinary obstruction, - 2014 Elevated PSA, normal PSAD - 04/03/2022, lower and DRE benign , - 01/02/2022, PSA stable and DRE was nl last visit. Check PSA in 4 mo, - 08/14/2020, DRE normal. PSA sent. , - 2021 (Stable), - 2020, - 2020, - 2019, - 2018, - 2017, Elevated prostate specific antigen (PSA), - 2017 Post-Trauma urethral stricture, male, meatal, I could not pass a 14 Fr catheter today. Disc office dilation vs Optilume in OR and the nature r/b/a to each. We discussed continuing surveillance. Proceed with optilume. - 04/03/2022, - 2017 Weak Urinary Stream, tams refilled - 04/03/2022, (Stable), - 2021, Weak urinary stream, - 2016 Splitting of urinary stream - 01/02/2022, - 2022, - 2020 Flank Pain - 2022 Other microscopic hematuria, passed a small stone,UA clear. - 08/14/2020, check renal US and cystoscopy , - 2021, Microscopic hematuria, - 2014 Urinary Frequency - 08/14/2020, Increased urinary frequency, - 2016 BPH w/o LUTS - 2020, -  2019, - 2018, - 2017, Benign localized prostatic hyperplasia without lower urinary tract symptoms (LUTS), - 2017 Postprocedural anterior bulbous urethral stricture, Postprocedural stricture of anterior urethra - 2017 Urinary Tract Inf, Unspec site, Pyuria - 2017 Spermatocele of epididymis, Unspec, Spermatocele - 2015 Acute prostatitis, Prostatitis, acute - 2014 History of urolithiasis, Nephrolithiasis - 2014 Ureteral calculus, Calculus of ureter - 2014      PMH Notes:  2007-04-16 14:49:23 - Note: Post-angioplasty   NON-GU PMH: Encounter for general adult medical examination without abnormal findings, Encounter for preventive health examination - 2015 Personal history of other diseases of the circulatory system, History of hypertension - 2014 Personal history of other endocrine, nutritional and metabolic disease, History of hypercholesterolemia - 2014 Atrial Fibrillation Stroke/TIA    FAMILY HISTORY: Death In The Family Father - Father Research officer, trade union Accident - Father   SOCIAL HISTORY: Marital  Status: Married     Notes: Former smoker, Tobacco Use, Alcohol Use, Caffeine Use, Marital History - Currently Married, Occupation:   REVIEW OF SYSTEMS:    GU Review Male:   Patient denies get up at night to urinate, penile pain, burning/ pain with urination, hard to postpone urination, leakage of urine, erection problems, stream starts and stops, frequent urination, have to strain to urinate , and trouble starting your stream.  Gastrointestinal (Upper):   Patient denies nausea, vomiting, and indigestion/ heartburn.  Gastrointestinal (Lower):   Patient denies diarrhea and constipation.  Constitutional:   Patient denies fever, night sweats, weight loss, and fatigue.  Skin:   Patient denies skin rash/ lesion and itching.  Eyes:   Patient denies blurred vision and double vision.  Ears/ Nose/ Throat:   Patient denies sore throat and sinus problems.  Hematologic/Lymphatic:   Patient denies  swollen glands and easy bruising.  Cardiovascular:   Patient denies leg swelling and chest pains.  Respiratory:   Patient denies cough and shortness of breath.  Endocrine:   Patient denies excessive thirst.  Musculoskeletal:   Patient denies back pain and joint pain.  Neurological:   Patient denies headaches and dizziness.  Psychologic:   Patient denies depression and anxiety.   VITAL SIGNS: None   MULTI-SYSTEM PHYSICAL EXAMINATION:    Constitutional: Well-nourished. No physical deformities. Normally developed. Good grooming.  Neck: Neck symmetrical, not swollen. Normal tracheal position.  Respiratory: No labored breathing, no use of accessory muscles.   Cardiovascular: Normal temperature, normal extremity pulses, no swelling, no varicosities.  Skin: No paleness, no jaundice, no cyanosis. No lesion, no ulcer, no rash.  Neurologic / Psychiatric: Oriented to time, oriented to place, oriented to person. No depression, no anxiety, no agitation.  Gastrointestinal: No mass, no tenderness, no rigidity, non obese abdomen.     Complexity of Data:   12/26/21 12/04/20 06/14/20 05/26/19 11/29/18 08/02/18 06/23/16 01/18/16  PSA  Total PSA 6.27 ng/mL 7.41 ng/mL 6.94 ng/mL 7.92 ng/mL 6.21 ng/mL 7.13 ng/mL 4.57  4.93   Free PSA   1.74 ng/mL 1.56 ng/mL  1.96 ng/mL 1.03  1.05   % Free PSA   25 % PSA 20 % PSA  27 % PSA 23  [id number]     11/11/10  Hormones  Testosterone, Total 314.35     PROCEDURES:          Urinalysis w/Scope Dipstick Dipstick Cont'd Micro  Color: Yellow Bilirubin: Neg mg/dL WBC/hpf: 10 - 20/hpf  Appearance: Clear Ketones: Neg mg/dL RBC/hpf: NS (Not Seen)  Specific Gravity: 1.025 Blood: Neg ery/uL Bacteria: Few (10-25/hpf)  pH: <=5.0 Protein: Trace mg/dL Cystals: NS (Not Seen)  Glucose: Neg mg/dL Urobilinogen: 0.2 mg/dL Casts: NS (Not Seen)    Nitrites: Neg Trichomonas: Not Present    Leukocyte Esterase: 1+ leu/uL Mucous: Present      Epithelial Cells: 0 - 5/hpf       Yeast: NS (Not Seen)      Sperm: Not Present    ASSESSMENT:      ICD-10 Details  1 GU:   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 He will proceed. Disc post-op foley.   2 NON-GU:   Bacteriuria - R82.71 Undiagnosed New Problem - not clinically infected - urine repeated and abx refilled to conitnue about a week p the procedure    PLAN:            Medications New Meds: Cephalexin  500 mg capsule 1 capsule PO Q HS   #10  0 Refill(s)  Pharmacy Name:  [id number]  Address:  [address]   [place], [place] [address]  Phone:  [phone]  Fax:  [phone]            Orders Labs Urine Culture          Schedule Return Visit/Planned Activity: Keep Scheduled Appointment - Extender          Document Letter(s):  Created for Patient: Clinical Summary         Next Appointment:      Next Appointment: 07/22/2022 09:15 AM    Appointment Type: Surgery     Location: [facility] [phone] [id number]    Provider: Jerilee Field, M.D.    Reason for Visit: OP Hadassah Pais DIL      * Signed by Jerilee Field, M.D. on 07/17/22 at 3:48 PM (EST)*      The information contained in this medical record document is considered private and confidential patient information. This information can only be used for the medical diagnosis and/or medical services that are being provided by the patient's selected caregivers. This information can only be distributed outside of the patient's care if the patient agrees and signs waivers of authorization for this information to be sent to an outside source or route.  Urine cx + klebseilla S to ceazolin. He is on nightly cephalexin. No dysuria and afebrile.

## 2022-07-22 ENCOUNTER — Ambulatory Visit (HOSPITAL_BASED_OUTPATIENT_CLINIC_OR_DEPARTMENT_OTHER): Payer: Medicare Other | Admitting: Anesthesiology

## 2022-07-22 ENCOUNTER — Ambulatory Visit (HOSPITAL_COMMUNITY): Payer: Medicare Other

## 2022-07-22 ENCOUNTER — Other Ambulatory Visit: Payer: Self-pay

## 2022-07-22 ENCOUNTER — Encounter (HOSPITAL_COMMUNITY): Payer: Self-pay | Admitting: Urology

## 2022-07-22 ENCOUNTER — Ambulatory Visit (HOSPITAL_COMMUNITY)
Admission: RE | Admit: 2022-07-22 | Discharge: 2022-07-22 | Disposition: A | Discharge status: Home / Self Care | Payer: Medicare Other | Source: Ambulatory Visit | Attending: Urology | Admitting: Urology

## 2022-07-22 ENCOUNTER — Ambulatory Visit (HOSPITAL_COMMUNITY): Payer: Medicare Other | Admitting: Physician Assistant

## 2022-07-22 ENCOUNTER — Encounter (HOSPITAL_COMMUNITY)
Admission: RE | Disposition: A | Discharge status: Home / Self Care | Payer: Self-pay | Source: Ambulatory Visit | Attending: Urology

## 2022-07-22 DIAGNOSIS — I351 Nonrheumatic aortic (valve) insufficiency: Secondary | ICD-10-CM | POA: Diagnosis present

## 2022-07-22 DIAGNOSIS — I10 Essential (primary) hypertension: Secondary | ICD-10-CM

## 2022-07-22 DIAGNOSIS — N401 Enlarged prostate with lower urinary tract symptoms: Secondary | ICD-10-CM | POA: Diagnosis not present

## 2022-07-22 DIAGNOSIS — I080 Rheumatic disorders of both mitral and aortic valves: Secondary | ICD-10-CM | POA: Diagnosis not present

## 2022-07-22 DIAGNOSIS — I4891 Unspecified atrial fibrillation: Secondary | ICD-10-CM | POA: Diagnosis not present

## 2022-07-22 DIAGNOSIS — Z87891 Personal history of nicotine dependence: Secondary | ICD-10-CM | POA: Insufficient documentation

## 2022-07-22 DIAGNOSIS — Z87442 Personal history of urinary calculi: Secondary | ICD-10-CM | POA: Diagnosis not present

## 2022-07-22 DIAGNOSIS — M1990 Unspecified osteoarthritis, unspecified site: Secondary | ICD-10-CM | POA: Insufficient documentation

## 2022-07-22 DIAGNOSIS — I2510 Atherosclerotic heart disease of native coronary artery without angina pectoris: Secondary | ICD-10-CM | POA: Insufficient documentation

## 2022-07-22 DIAGNOSIS — N35912 Unspecified bulbous urethral stricture, male: Secondary | ICD-10-CM | POA: Insufficient documentation

## 2022-07-22 DIAGNOSIS — R3912 Poor urinary stream: Secondary | ICD-10-CM | POA: Insufficient documentation

## 2022-07-22 DIAGNOSIS — N35919 Unspecified urethral stricture, male, unspecified site: Secondary | ICD-10-CM

## 2022-07-22 DIAGNOSIS — N35013 Post-traumatic anterior urethral stricture: Secondary | ICD-10-CM

## 2022-07-22 HISTORY — PX: BALLOON DILATION: SHX5330

## 2022-07-22 SURGERY — BALLOON DILATION
Anesthesia: General

## 2022-07-22 MED ORDER — SODIUM CHLORIDE 0.9 % IR SOLN
Status: DC | PRN
  Administered 2022-07-22: 3000 mL

## 2022-07-22 MED ORDER — LACTATED RINGERS IV SOLN: INTRAVENOUS | Status: DC

## 2022-07-22 MED ORDER — CHLORHEXIDINE GLUCONATE 0.12 % MT SOLN
15.0000 mL | Freq: Once | OROMUCOSAL | Status: AC
  Administered 2022-07-22: 15 mL via OROMUCOSAL

## 2022-07-22 MED ORDER — FENTANYL CITRATE (PF) 100 MCG/2ML IJ SOLN
INTRAMUSCULAR | Status: AC
  Filled 2022-07-22: qty 2

## 2022-07-22 MED ORDER — PROPOFOL 10 MG/ML IV BOLUS
INTRAVENOUS | Status: DC | PRN
  Administered 2022-07-22: 100 mg via INTRAVENOUS

## 2022-07-22 MED ORDER — ONDANSETRON HCL 4 MG/2ML IJ SOLN
INTRAMUSCULAR | Status: DC | PRN
  Administered 2022-07-22: 4 mg via INTRAVENOUS

## 2022-07-22 MED ORDER — ACETAMINOPHEN 500 MG PO TABS
1000.0000 mg | ORAL_TABLET | Freq: Once | ORAL | Status: AC
  Administered 2022-07-22: 1000 mg via ORAL
  Filled 2022-07-22: qty 2

## 2022-07-22 MED ORDER — ONDANSETRON HCL 4 MG/2ML IJ SOLN
INTRAMUSCULAR | Status: AC
  Filled 2022-07-22: qty 2

## 2022-07-22 MED ORDER — PHENYLEPHRINE HCL (PRESSORS) 10 MG/ML IV SOLN
INTRAVENOUS | Status: DC | PRN
  Administered 2022-07-22 (×5): 80 ug via INTRAVENOUS

## 2022-07-22 MED ORDER — DEXAMETHASONE SODIUM PHOSPHATE 10 MG/ML IJ SOLN
INTRAMUSCULAR | Status: DC | PRN
  Administered 2022-07-22: 5 mg via INTRAVENOUS

## 2022-07-22 MED ORDER — PROPOFOL 10 MG/ML IV BOLUS
INTRAVENOUS | Status: AC
  Filled 2022-07-22: qty 20

## 2022-07-22 MED ORDER — LIDOCAINE HCL (PF) 2 % IJ SOLN
INTRAMUSCULAR | Status: AC
  Filled 2022-07-22: qty 5

## 2022-07-22 MED ORDER — FENTANYL CITRATE (PF) 100 MCG/2ML IJ SOLN
INTRAMUSCULAR | Status: DC | PRN
  Administered 2022-07-22 (×3): 50 ug via INTRAVENOUS

## 2022-07-22 MED ORDER — IOHEXOL 300 MG/ML  SOLN
INTRAMUSCULAR | Status: DC | PRN
  Administered 2022-07-22: 10 mL

## 2022-07-22 MED ORDER — DEXAMETHASONE SODIUM PHOSPHATE 10 MG/ML IJ SOLN
INTRAMUSCULAR | Status: AC
  Filled 2022-07-22: qty 1

## 2022-07-22 MED ORDER — CEFAZOLIN SODIUM-DEXTROSE 2-4 GM/100ML-% IV SOLN
2.0000 g | Freq: Once | INTRAVENOUS | Status: AC
  Administered 2022-07-22: 2 g via INTRAVENOUS
  Filled 2022-07-22: qty 100

## 2022-07-22 MED ORDER — LIDOCAINE HCL (CARDIAC) PF 100 MG/5ML IV SOSY
PREFILLED_SYRINGE | INTRAVENOUS | Status: DC | PRN
  Administered 2022-07-22: 60 mg via INTRAVENOUS

## 2022-07-22 MED ORDER — ORAL CARE MOUTH RINSE: 15.0000 mL | Freq: Once | OROMUCOSAL | Status: AC

## 2022-07-22 MED ORDER — STERILE WATER FOR IRRIGATION IR SOLN
Status: DC | PRN
  Administered 2022-07-22: 30 mL

## 2022-07-22 MED ORDER — PHENYLEPHRINE 80 MCG/ML (10ML) SYRINGE FOR IV PUSH (FOR BLOOD PRESSURE SUPPORT)
PREFILLED_SYRINGE | INTRAVENOUS | Status: AC
  Filled 2022-07-22: qty 10

## 2022-07-22 MED ORDER — 0.9 % SODIUM CHLORIDE (POUR BTL) OPTIME
TOPICAL | Status: DC | PRN
  Administered 2022-07-22: 1000 mL

## 2022-07-22 SURGICAL SUPPLY — 24 items
BAG DRN RND TRDRP ANRFLXCHMBR (UROLOGICAL SUPPLIES) ×1
BAG URINE DRAIN 2000ML AR STRL (UROLOGICAL SUPPLIES) ×2 IMPLANT
BALLN NEPHROSTOMY (BALLOONS)
BALLN OPTILUME DCB 30X5X75 (BALLOONS) ×1
BALLOON NEPHROSTOMY (BALLOONS) IMPLANT
BALLOON OPTILUME DCB 30X5X75 (BALLOONS) IMPLANT
CATH FOLEY 2W COUNCIL 5CC 16FR (CATHETERS) IMPLANT
CATH FOLEY 2WAY SLVR  5CC 16FR (CATHETERS) ×1
CATH FOLEY 2WAY SLVR 5CC 16FR (CATHETERS) IMPLANT
CATH SET URETHRAL DILATOR (CATHETERS) IMPLANT
CATH URETL OPEN END 6FR 70 (CATHETERS) IMPLANT
CLOTH BEACON ORANGE TIMEOUT ST (SAFETY) ×2 IMPLANT
DEVICE INFLATION ATRION QL4015 (MISCELLANEOUS) IMPLANT
GLOVE BIO SURGEON STRL SZ7.5 (GLOVE) ×2 IMPLANT
GOWN STRL REUS W/ TWL XL LVL3 (GOWN DISPOSABLE) ×2 IMPLANT
GOWN STRL REUS W/TWL XL LVL3 (GOWN DISPOSABLE) ×1
GUIDEWIRE ANG ZIPWIRE 038X150 (WIRE) IMPLANT
GUIDEWIRE STR DUAL SENSOR (WIRE) ×2 IMPLANT
HOLDER FOLEY CATH W/STRAP (MISCELLANEOUS) IMPLANT
MANIFOLD NEPTUNE II (INSTRUMENTS) IMPLANT
NS IRRIG 1000ML POUR BTL (IV SOLUTION) IMPLANT
PACK CYSTO (CUSTOM PROCEDURE TRAY) ×2 IMPLANT
PENCIL SMOKE EVACUATOR (MISCELLANEOUS) IMPLANT
WATER STERILE IRR 3000ML UROMA (IV SOLUTION) ×2 IMPLANT

## 2022-07-22 NOTE — Transfer of Care (Signed)
Immediate Anesthesia Transfer of Care Note  Patient: Edward [name]  Procedure(s) Performed: Willa Rough DILATION  Patient Location: PACU  Anesthesia Type:General  Level of Consciousness: drowsy and patient cooperative  Airway & Oxygen Therapy: Patient Spontanous Breathing and Patient connected to face mask oxygen  Post-op Assessment: Report given to RN and Post -op Vital signs reviewed and stable  Post vital signs: Reviewed and stable  Last Vitals:  Vitals Value Taken Time  BP 153/70 1014  Temp    Pulse 71 07/22/22 1014  Resp 10 07/22/22 1014  SpO2 100 % 07/22/22 1014  Vitals shown include unvalidated device data.  Last Pain:  Vitals:   07/22/22 0725  TempSrc:   PainSc: 0-No pain         Complications: No notable events documented.

## 2022-07-22 NOTE — Discharge Instructions (Signed)
OPTILUME Balloon Urethral Dilation  Urethral dilation is a procedure to stretch open (dilate) the urethra. The urethra is the tube that drains urine from the bladder out of the body. In women, the urethra opens above the vaginal opening. In men, the urethra opens at the tip of the penis. Urethral dilation is usually done to treat narrowing of the urethra (urethral stricture), which can make it difficult to pass urine. Urethral dilation widens the urethra so that you can pass urine normally. Urethral dilation is done through the urethral opening. There are no incisions made during the procedure. General instructions Use a condom for 30 days after the procedure during intercourse and birth control for 6 months  Plan to have someone take you home from the hospital or clinic. If you will be going home right after the procedure, plan to have someone with you for 24 hours. Ask your health care provider what steps will be taken to help prevent infection. These may include: Washing skin with a germ-killing soap. Taking antibiotic medicine. What happens during the procedure? An IV may be inserted into one of your veins. You will be given one or more of the following medicines: A local anesthetic to numb your urethral opening. This will be applied as a gel that will also lubricate the urethral opening. A sedative to help you relax. A thin tube with a light and camera on the end (cystoscope) will be inserted into your urethra. Your urethra will be rinsed (irrigated) with a germ-free (sterile) water solution. Narrow parts of your urethra will be stretched open using a dilator tool. Your surgeon will start with a very thin dilator, then use wider dilators as needed. A thin tube with an inflatable balloon on the tip may be inserted into your urethra. The balloon may be inflated to help stretch your urethra open. Your urethra will be irrigated. The procedure may vary among health care providers and  hospitals. What can I expect after the procedure? After the procedure, it is common to have: Burning pain when urinating. Blood in your urine. A need to urinate frequently. You will be asked to urinate before you leave the hospital or clinic. Your urine flow should improve within a few days. Follow these instructions at home: Medicines Take over-the-counter and prescription medicines only as told by your health care provider. If you were prescribed an antibiotic medicine, take it as told by your health care provider. Do not stop taking the antibiotic even if you start to feel better. Ask your health care provider if the medicine prescribed to you: Requires you to avoid driving or using heavy machinery. Can cause constipation. You may need to take these actions to prevent or treat constipation: Take over-the-counter or prescription medicines. Eat foods that are high in fiber, such as beans, whole grains, and fresh fruits and vegetables. Limit foods that are high in fat and processed sugars, such as fried or sweet foods. General instructions Do not drive for 24 hours if you were given a sedative during your procedure. If you were sent home with a small, lubricated tube (catheter) to help keep your urethra open, follow your health care provider's instructions about how and when to use it. Drink enough fluid to keep your urine pale yellow. Return to your normal activities as told by your health care provider. Ask your health care provider what activities are safe for you. Keep all follow-up visits as told by your health care provider. This is important. Contact a health care  provider if: Your urine is cloudy and smells bad. You develop new bleeding when you urinate. You pass blood clots when you urinate. You have pain that does not get better with medicine. You have a fever. You have swelling, bruising, or discoloration of your genital area. This includes the penis, scrotum, and inner  thighs for men, and the outer genital organs (vulva) and inner thighs for women. Get help right away if: You develop new bleeding that does not stop. You cannot pass urine. Summary Urethral dilation is a procedure to stretch open (dilate) the urethra. Urethral dilation is usually done to treat narrowing of the urethra (urethral stricture), which can make it difficult to pass urine. Ask your health care provider about changing or stopping your regular medicines before the procedure. After the procedure, it is common to have burning pain when urinating, blood in your urine, and a need to urinate frequently. This information is not intended to replace advice given to you by your health care provider. Make sure you discuss any questions you have with your health care provider. Document Revised: 09/16/2018 Document Reviewed: 09/16/2018 Elsevier Patient Education  2023 [employer].

## 2022-07-22 NOTE — Op Note (Signed)
Preoperative diagnosis: Penile urethral stricture Postoperative diagnosis: Same  Procedure: Retrograde urethrogram, OptiLume balloon dilation of urethral stricture  Surgeon: Junious Silk  Anesthesia: General  Indication for procedure: Edward [name] is a 81 year old male with a history of weak stream.  Office cystoscopy revealed a pinpoint penile urethral stricture about 15 mm proximal to the fossa navicularis.  He had a prior dilation that recurred.  Findings:  On cystoscopy there was a pinpoint penile urethral stricture.  After stricture dilation the stricture was about 15 mm in length and about 15 mm in from the fossa navicularis.  Once dilated the bulbar urethra appeared normal, membranous urethra appeared normal, prostate showed bilobar hypertrophy but a good channel with a widely patent bladder neck from his prior laser vaporization.  Bladder was inspected with no mucosal lesion.  No stone or foreign body in the bladder.  I could visualize the right ureteral orifice but the left ureteral orifice was just tucked under some intravesical prostate.  Retrograde urethrogram-was difficult to perform given the distal nature of the stricture but contrast appeared to fill out a normal bulbar urethra with narrowing of the penile urethra. I was unable to clearly see the length of the stricture but did see filling of a normal bulbar urethra with narrowing to a normal membranous urethra, prostatic urethra visualized and then contrast in the bladder with reflux into the left distal ureter.  On exam under anesthesia the penis was circumcised without mass or lesion.  The glans and meatus appeared normal.  Testicles were descended bilaterally and palpably normal.  On DRE the prostate was about 80 g and smooth without hard area or nodule.  Description of procedure: After consent was obtained patient brought to the operating room.  After adequate anesthesia he is placed lithotomy position and prepped and draped in the usual  sterile fashion.  Timeout was performed from the patient and procedure.  Cystoscope was passed per urethra where the stricture was located.  I then placed a 16 French Foley catheter into the meatus and filled the balloon with 2 to 3 mL given the distal nature of the stricture the balloon and catheter were get pushed out of the meatus.  I secured at the best I could and after multiple attempts was able to inject retrograde contrast.  I was unable to clearly see the length of the stricture but did see filling of a normal bulbar urethra with narrowing to a normal membranous urethra, prostatic urethra visualized and then contrast in the bladder with reflux into the left distal ureter.  Then under cystoscopic visualization I passed a sensor wire and coiled in the bladder under fluoroscopy.  I then used the Bard urethral dilators to dilate from 12-24 [place].  Now the 21 French cystoscope went without difficulty.  The remainder of the urethra and the bladder was carefully inspected.  The scope was backed out.  The wire was backloaded on the cystoscope and a 30 French 50 mm OptiLume balloon was then advanced and allowed to presoaked.  Under direct visualization the balloon was inflated with only to 6 atm given it was a 30 [place] balloon.  We slowly went to 7 and then 8 atm.  There was a slight waist in the balloon as expected in the penile urethra at the stricture site.  However there was good contact and good drug delivery.  Balloon was left inflated for 7 minutes deflated and then removed.  A 16 French council tip catheter was then advanced with the balloon inflated  and seated at the bladder neck.  It was left gravity drainage.  Urine was clear.  No bleeding per meatus.  Exam under anesthesia performed.  He was awakened and taken the cover room in stable condition.  Complications: None  Blood loss: Minimal  Specimen: None  Drains: 16 French Foley  Disposition: Patient stable to PACU

## 2022-07-22 NOTE — Anesthesia Procedure Notes (Signed)
Procedure Name: LMA Insertion Date/Time: 07/22/2022 9:13 AM  Performed by: Yolonda Kida, CRNAPre-anesthesia Checklist: Patient identified, Emergency Drugs available, Suction available and Patient being monitored Patient Re-evaluated:Patient Re-evaluated prior to induction Oxygen Delivery Method: Circle system utilized Preoxygenation: Pre-oxygenation with 100% oxygen Induction Type: IV induction Ventilation: Mask ventilation without difficulty LMA: LMA inserted LMA Size: 4.0 Tube type: Oral Number of attempts: 1 Placement Confirmation: positive ETCO2 and breath sounds checked- equal and bilateral Tube secured with: Tape Dental Injury: Teeth and Oropharynx as per pre-operative assessment

## 2022-07-22 NOTE — Anesthesia Postprocedure Evaluation (Signed)
Anesthesia Post Note  Patient: Edward [name]  Procedure(s) Performed: Willa Rough DILATION     Patient location during evaluation: PACU Anesthesia Type: General Level of consciousness: sedated and patient cooperative Pain management: pain level controlled Vital Signs Assessment: post-procedure vital signs reviewed and stable Respiratory status: spontaneous breathing Cardiovascular status: stable Anesthetic complications: no   No notable events documented.  Last Vitals:  Vitals:   07/22/22 1045 07/22/22 1115  BP: (!) 143/68 105/89  Pulse: 75 74  Resp: 15 14  Temp:  36.5 C  SpO2: 97% 99%    Last Pain:  Vitals:   07/22/22 1115  TempSrc:   PainSc: 0-No pain                 Lewie Loron

## 2022-07-22 NOTE — Interval H&P Note (Signed)
History and Physical Interval Note:  07/22/2022 7:25 AM  Edward [name]  has presented today for surgery, with the diagnosis of URETHRAL STRICTURE.  The various methods of treatment have been discussed with the patient and family. After consideration of risks, benefits and other options for treatment, the patient has consented to  Procedure(s) with comments: OPTILUME BALLOON DILATION (N/A) - 60 MINS as a surgical intervention.  The patient's history has been reviewed, patient examined, no change in status, stable for surgery.  I have reviewed the patient's chart and labs. Again he confirms he is taking nightly antibiotic.  No dysuria or gross hematuria.  No fever.  Again discussed risk of penile pain, bladder pain or stricture recurrence among others.  Discussed postoperative Foley.  All questions answered. He elects to proceed.    Jerilee Field

## 2022-07-23 ENCOUNTER — Encounter (HOSPITAL_COMMUNITY): Payer: Self-pay | Admitting: Urology

## 2022-11-12 ENCOUNTER — Ambulatory Visit: Payer: Medicare Other | Admitting: Cardiology

## 2022-12-09 NOTE — Progress Notes (Signed)
Cardiology Office Note Date:  12/16/2022   ID:  Edward [name], Edward [name] [birth year], MRN [id number]  PCP:  [name], MD  Cardiologist:  Ibrahem Volkman [place] MD  Chief Complaint  Patient presents with   Coronary Artery Disease   Atrial Fibrillation   History of Present Illness: Edward [name] is a 82 y.o. male who is seen for evaluation of CAD.   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 This was his second episode, the previous occurring approximately one year earlier. He had been compliant with aspirin prior to the first event, and has subsequently been compliant with plavix which was started  at the time of his first event.  His evaluation included an echocardiogram, carotid ultrasound, CT and MRI studies of the brain.  All of these were unrevealing.  There was no evidence of cortical infarction on his neuroimaging studies.  He had no arrhythmia documented.  His  TEE demonstrated a small PFO. He was seen by Dr. Excell Seltzer for consideration of PFO closure but after discussion with Neurology this was not felt to be indication. He subsequently had an implantable loop recorder placed by Dr Ladona Ridgel. There is a nursing note from Jan 3 stating he had an episode of AFib and he was started on Eliquis. 2 days after starting Eliquis he developed a hematoma in his left shoulder. Eliquis was reduced for 2 weeks then increase back to 5 mg bid. Plavix was stoppped.   He has a history of CAD with remote angioplasty of the LAD in 1995.  He really has never had recurrent angina. He does have  a history of vertigo. Last loop recorder check on Dec 29 showed only one episode of Afib lasting minutes.  He was seen in January with transient confusion of unclear etiology. Carotid dopplers showed no significant stenosis. Seen by Dr Everlena Cooper. EEG on 10/15/2020 was normal.  MRI of brain without contrast on 10/30/2020  showed moderate  Cerebral chronic small vessel  ischemic changes but no stroke or other acute intracranial abnormalities. We discussed his PFO but given uncertainty regarding diagnosis did not feel that closure of PFO was indicated.   Since my last visit patient wished to be considered for a Watchman device in order not to have to be on anticoagulation. He was seen by Dr Lalla Brothers and underwent left atrial appendage occlusive device placement with Watchman FLX 27mm in May.   On follow up today he is doing OK. Notes his chronic dizziness and imbalance are unchanged. Denies any palpitations, chest pain or SOB. Not as active as he has been in the past. Shoulders ache. He does walk his dog and keeps up with mowing but not as motivated as in the past.    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 Lalla Brothers   Spermatocele    left   Stroke [facility])    TIA's   Urgency of urination    Vertigo     Past Surgical History:  Procedure Laterality Date   BALLOON DILATION N/A 07/22/2022   Procedure: OPTILUME BALLOON DILATION;  Surgeon: Jerilee Field, MD;  Location: [facility];  Service: Urology;  Laterality: N/A;  60 MINS   CORONARY ANGIOPLASTY  1995  DR [place]   LAD   CYSTOSCOPY N/A 09/18/2015   Procedure: CYSTOSCOPY;  Surgeon: Jerilee Field, MD;  Location: [facility];  Service: Urology;  Laterality: N/A;   CYSTOSCOPY WITH RETROGRADE PYELOGRAM, URETEROSCOPY AND STENT PLACEMENT Left 03/01/2013   Procedure: CYSTOSCOPY WITH LEFT RETROGRADE PYELOGRAM, LEFT URETEROSCOPY and  stent PLACEMENT;  Surgeon: Antony Haste, MD;  [facility];  Service: Urology;  Laterality: Left;   EYE SURGERY Left    cataract surgery   FOOT FUSION Left    SECONDARY TO FX'S   GREEN LIGHT LASER TURP (TRANSURETHRAL RESECTION OF PROSTATE N/A 10/04/2013   Procedure: GREEN LIGHT LASER TURP (TRANSURETHRAL RESECTION OF PROSTATE;  Surgeon: Antony Haste, MD;  Location: [facility];  Service: Urology;  Laterality: N/A;   INGUINAL HERNIA REPAIR Bilateral    LEFT ATRIAL APPENDAGE OCCLUSION N/A 01/09/2022   Procedure: LEFT ATRIAL APPENDAGE OCCLUSION;  Surgeon: Lanier Prude, MD;  Location: [facility];  Service: Cardiovascular;  Laterality: N/A;   LOOP RECORDER INSERTION N/A 04/20/2018   Procedure: LOOP RECORDER INSERTION;  Surgeon: Marinus Maw, MD;  Location: [facility];  Service: Cardiovascular;  Laterality: N/A;   SHOULDER ARTHROSCOPY Right 08/18/2002   SHOULDER ARTHROSCOPY WITH DEBRIDEMENT AND BICEP TENDON REPAIR Left 04/28/2011   SPERMATOCELECTOMY Left 10/04/2013   Procedure: LEFT SPERMATOCELECTOMY;  Surgeon: Antony Haste, MD;  Location: [facility];  Service: Urology;  Laterality: Left;   TEE WITHOUT CARDIOVERSION N/A 02/16/2018   Procedure: TRANSESOPHAGEAL ECHOCARDIOGRAM (TEE);  Surgeon: Chrystie Nose, MD;  Location: [facility]COPY;  Service: Cardiovascular;  Laterality: N/A;   TEE WITHOUT CARDIOVERSION N/A 01/09/2022   Procedure: TRANSESOPHAGEAL ECHOCARDIOGRAM (TEE);  Surgeon: Lanier Prude, MD;  Location: [facility] INVASIVE CV [facility];  Service: Cardiovascular;  Laterality: N/A;   TRANSURETHRAL RESECTION OF PROSTATE N/A 12/11/2014   Procedure: CYSTOSCOPY, CLOT EVACUATION, WITH FULGURATION ;  Surgeon: Bjorn Pippin, MD;  Location: [facility];  Service: Urology;  Laterality: N/A;   TRANSURETHRAL RESECTION OF PROSTATE N/A 09/18/2015   Procedure: TRANSURETHRAL RESECTION OF THE PROSTATE (TURP);  Surgeon: Jerilee Field, MD;  Location: [facility];  Service: Urology;  Laterality: N/A;   UMBILICAL HERNIA REPAIR  06/02/2008   UMBILICAL HERNIA REPAIR N/A 11/12/2021   Procedure: UMBILICAL HERNIA REPAIR;  Surgeon: Abigail Miyamoto, MD;  Location: [facility];  Service: General;  Laterality: N/A;  LMA    Current Outpatient Medications  Medication Sig Dispense Refill   acetaminophen (TYLENOL) 500 MG tablet Take 500 mg by mouth every 6 (six) hours as needed for moderate pain or mild pain.     amLODipine (NORVASC) 5 MG tablet Take 1 tablet (5 mg total) by mouth daily. 90 tablet 3   aspirin EC 81 MG tablet Take 81 mg by mouth daily. Swallow whole.     budesonide-formoterol (SYMBICORT) 160-4.5 MCG/ACT inhaler Inhale 1 puff into the lungs 2 (two) times daily as needed (chest tightness).     Cholecalciferol (VITAMIN D3 PO) Take 1 tablet by mouth every other day.     Coenzyme Q10 (  CO Q 10 PO) Take 300 mg by mouth daily at 12 noon.     Cyanocobalamin (VITAMIN B-12 PO) Take 1 tablet by mouth every other day.     losartan (COZAAR) 100 MG tablet Take 1 tablet (100 mg total) by mouth daily. 90 tablet 3   MAGNESIUM CITRATE PO Take 250 mg by mouth every other day.     Multiple Vitamin (MULTIVITAMIN WITH MINERALS) TABS tablet Take 1 tablet by mouth every other day.     Multiple Vitamins-Minerals (ZINC PO) Take 1 tablet by mouth every other day.     Probiotic Product (PROBIOTIC PO) Take 1 capsule by mouth in the morning.     rosuvastatin (CRESTOR) 20 MG tablet Take 1 tablet (20 mg total) by mouth every evening. 90 tablet 3   tamsulosin (FLOMAX) 0.4 MG CAPS capsule Take 0.4 mg by mouth at bedtime.     VITAMIN E PO Take 1 tablet by mouth every other day.     No current facility-administered medications for this visit.    Allergies:   Augmentin [amoxicillin-pot clavulanate], Codeine, Levaquin [levofloxacin], and Zocor [simvastatin]   Social History:  The patient  reports that he quit smoking about 59 years ago. His smoking use included cigarettes. He has a  1.50 pack-year smoking history. He has never used smokeless tobacco. He reports current alcohol use. He reports that he does not use drugs.   Family History:  The patient's family history is not on file.    ROS:  Please see the history of present illness.  All other systems are reviewed and negative.    PHYSICAL EXAM: VS:  BP (!) 140/53   Pulse 61   Ht 5\' 6"  (1.676 m)   Wt 150 lb 12.8 oz (68.4 kg)   SpO2 100%   BMI 24.34 kg/m  , BMI Body mass index is 24.34 kg/m. GEN: Well nourished, well developed, in no acute distress  HEENT: normal  Neck: no JVD, no masses. No carotid bruits Cardiac: RRR  No gallop. There is a gr 3/6 holo-diastolic murmur LSB to apex.        Respiratory:  clear to auscultation bilaterally, normal work of breathing GI: soft, nontender, nondistended, + BS MS: no deformity or atrophy. There is a large mature bruise in the left shoulder and upper arm. Ext: no pretibial edema, pedal pulses 2+= bilaterally Skin: warm and dry, no rash Neuro:  Strength and sensation are intact Psych: euthymic mood, full affect  EKG:  EKG is  not ordered today.      Recent Labs: 07/15/2022: BUN 12; Creatinine, Ser 0.95; Hemoglobin 12.8; Platelets 191; Potassium 4.1; Sodium 139   Lipid Panel     Component Value Date/Time   CHOL 117 01/15/2018 0453   TRIG 64 01/15/2018 0453   HDL 41 01/15/2018 0453   CHOLHDL 2.9 01/15/2018 0453   VLDL 13 01/15/2018 0453   LDLCALC 63 01/15/2018 0453   Dated 04/09/18: cholesterol 134, triglycerides 87, HDL 45, LDL 72. CBC, chemistries, TSH normal. Dated 04/21/19: cholesterol 118, triglycerides 66, HDL 40, LDL 65. CMET, CBC, TSH normal Dated 04/25/20: cholesterol 120, triglycerides 46, HDL 44, LDL 67.  Dated 09/24/20: normal CMET Dated 02/24/22: cholesterol 111, triglycerides 79, HDL 47, LDL 49. CMET and TSH normal Dated 04/15/22: Hgb 11.2.   Wt Readings from Last 3 Encounters:  12/16/22 150 lb 12.8 oz (68.4 kg)  07/22/22 143 lb 1.3 oz (64.9 kg)   07/15/22 143 lb (64.9 kg)  Cardiac Studies Reviewed: TEE 02-16-2018: Study Conclusions   - Left ventricle: The cavity size was normal. There was mild   concentric hypertrophy. Systolic function was normal. The   estimated ejection fraction was in the range of 55% to 60%. Wall   motion was normal; there were no regional wall motion   abnormalities. - Aortic valve: Trileaflet. Mild to moderate eccentric AI, 2 jets,   the larger jet is toward the anterior mitral valve leaflet. - Aorta: Dilated sinus of valsalva to 4.1 cm. - Left atrium: Mildly dilated. No evidence of thrombus in the   atrial cavity or appendage. - Right atrium: No evidence of thrombus in the atrial cavity or   appendage. - Atrial septum: There is a small PFO by saline microbubble   contrast which demonstrates intermittent right to left flow.   Impressions:   - Small PFO with intermittent right to left flow noted by saline   microbubble contrast. This can be a possible mechanism for his   TIA. Consider referral to Dr. Excell Seltzer with the structural heart   clinic for closure evaluation.  Assessment/Plan:  1. CAD remote PTCA of the LAD in 1995. No recurrent angina. Continue amlodipine and statin. Continue ASA and statin therapy. Continue amlodipine.  2. Paroxysmal Afib. Noted on loop recorder. Marland Kitchen He is  asymptomatic.  S/p placement of Watchman device. Off anticoagulation now. Did have significant hematuria resolved.  3. HLD excellent control on Crestor 4. HTN well controlled.  5. Transient confusion. Negative Neurologic evaluation.  6. 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 Moderate to severe by TEE - asymptomatic. LV size and function normal. Will update Echo now.   I will follow up in 6 months   Signed, Jazara Swiney [place], MD  12/16/2022 8:46 AM    [facility] [address] [place], [place], [place]  [address] Phone: [phone]; Fax: [phone]

## 2022-12-16 ENCOUNTER — Encounter: Payer: Self-pay | Admitting: Cardiology

## 2022-12-16 ENCOUNTER — Ambulatory Visit: Payer: Medicare Other | Attending: Cardiology | Admitting: Cardiology

## 2022-12-16 VITALS — BP 140/53 | HR 61 | Ht 66.0 in | Wt 150.8 lb

## 2022-12-16 DIAGNOSIS — Z95818 Presence of other cardiac implants and grafts: Secondary | ICD-10-CM | POA: Diagnosis present

## 2022-12-16 DIAGNOSIS — I351 Nonrheumatic aortic (valve) insufficiency: Secondary | ICD-10-CM | POA: Insufficient documentation

## 2022-12-16 DIAGNOSIS — I10 Essential (primary) hypertension: Secondary | ICD-10-CM | POA: Diagnosis present

## 2022-12-16 DIAGNOSIS — I2510 Atherosclerotic heart disease of native coronary artery without angina pectoris: Secondary | ICD-10-CM | POA: Diagnosis present

## 2022-12-16 DIAGNOSIS — I480 Paroxysmal atrial fibrillation: Secondary | ICD-10-CM | POA: Insufficient documentation

## 2022-12-16 NOTE — Patient Instructions (Signed)
Medication Instructions:  Continue same medications *If you need a refill on your cardiac medications before your next appointment, please call your pharmacy*   Lab Work: None ordered   Testing/Procedures: Echo   Follow-Up: At [facility], you and your health needs are our priority.  As part of our continuing mission to provide you with exceptional heart care, we have created designated Provider Care Teams.  These Care Teams include your primary Cardiologist (physician) and Advanced Practice Providers (APPs -  Physician Assistants and Nurse Practitioners) who all work together to provide you with the care you need, when you need it.  We recommend signing up for the patient portal called "MyChart".  Sign up information is provided on this After Visit Summary.  MyChart is used to connect with patients for Virtual Visits (Telemedicine).  Patients are able to view lab/test results, encounter notes, upcoming appointments, etc.  Non-urgent messages can be sent to your provider as well.   To learn more about what you can do with MyChart, go to [email or web address].    Your next appointment:  6 months    Call in [facility] May to schedule Oct appointment     Provider:  Dr.Jordan

## 2022-12-25 ENCOUNTER — Other Ambulatory Visit: Payer: Self-pay

## 2022-12-25 ENCOUNTER — Encounter (HOSPITAL_BASED_OUTPATIENT_CLINIC_OR_DEPARTMENT_OTHER): Payer: Self-pay

## 2022-12-25 DIAGNOSIS — M545 Low back pain, unspecified: Secondary | ICD-10-CM | POA: Insufficient documentation

## 2022-12-25 DIAGNOSIS — Z5321 Procedure and treatment not carried out due to patient leaving prior to being seen by health care provider: Secondary | ICD-10-CM | POA: Insufficient documentation

## 2022-12-25 DIAGNOSIS — R509 Fever, unspecified: Secondary | ICD-10-CM | POA: Diagnosis present

## 2022-12-25 DIAGNOSIS — R5383 Other fatigue: Secondary | ICD-10-CM | POA: Diagnosis not present

## 2022-12-25 NOTE — ED Triage Notes (Signed)
Patient reports starting to feel fatigued on Wednesday, fever starting today. Patient endorses increased urgency to urinate with decreased output. Patient also reports pain in the lower left back.

## 2022-12-26 ENCOUNTER — Emergency Department (HOSPITAL_BASED_OUTPATIENT_CLINIC_OR_DEPARTMENT_OTHER)
Admission: EM | Admit: 2022-12-26 | Discharge: 2022-12-26 | Discharge status: Against Medical Advice | Payer: Medicare Other | Attending: Emergency Medicine | Admitting: Emergency Medicine

## 2022-12-30 ENCOUNTER — Ambulatory Visit (HOSPITAL_BASED_OUTPATIENT_CLINIC_OR_DEPARTMENT_OTHER): Payer: Medicare Other | Admitting: Family

## 2022-12-30 ENCOUNTER — Ambulatory Visit (INDEPENDENT_AMBULATORY_CARE_PROVIDER_SITE_OTHER): Payer: Medicare Other | Admitting: Family

## 2022-12-30 ENCOUNTER — Encounter (HOSPITAL_BASED_OUTPATIENT_CLINIC_OR_DEPARTMENT_OTHER): Payer: Self-pay | Admitting: Family

## 2022-12-30 ENCOUNTER — Telehealth: Payer: Self-pay

## 2022-12-30 ENCOUNTER — Telehealth: Payer: Self-pay | Admitting: Cardiology

## 2022-12-30 VITALS — BP 122/74 | HR 82 | Ht 66.0 in | Wt 147.0 lb

## 2022-12-30 DIAGNOSIS — I480 Paroxysmal atrial fibrillation: Secondary | ICD-10-CM | POA: Diagnosis not present

## 2022-12-30 DIAGNOSIS — I25118 Atherosclerotic heart disease of native coronary artery with other forms of angina pectoris: Secondary | ICD-10-CM

## 2022-12-30 DIAGNOSIS — E785 Hyperlipidemia, unspecified: Secondary | ICD-10-CM

## 2022-12-30 DIAGNOSIS — Z95818 Presence of other cardiac implants and grafts: Secondary | ICD-10-CM

## 2022-12-30 DIAGNOSIS — I351 Nonrheumatic aortic (valve) insufficiency: Secondary | ICD-10-CM

## 2022-12-30 NOTE — Telephone Encounter (Signed)
Spoke to patient appointment scheduled with [name] today 5/14 at 1:55 pm.

## 2022-12-30 NOTE — Patient Instructions (Signed)
Medication Instructions:  Continue your current medications.   *If you need a refill on your cardiac medications before your next appointment, please call your pharmacy*  Testing/Procedures: Echocardiogram Friday as scheduled.   Follow-Up: At [facility], you and your health needs are our priority.  As part of our continuing mission to provide you with exceptional heart care, we have created designated Provider Care Teams.  These Care Teams include your primary Cardiologist (physician) and Advanced Practice Providers (APPs -  Physician Assistants and Nurse Practitioners) who all work together to provide you with the care you need, when you need it.  We recommend signing up for the patient portal called "MyChart".  Sign up information is provided on this After Visit Summary.  MyChart is used to connect with patients for Virtual Visits (Telemedicine).  Patients are able to view lab/test results, encounter notes, upcoming appointments, etc.  Non-urgent messages can be sent to your provider as well.   To learn more about what you can do with MyChart, go to [email or web address].    Your next appointment:   June 10, 2023 as scheduled with Dr. [place]

## 2022-12-30 NOTE — Progress Notes (Signed)
Report received from primary care provider after clinic visit.  Office visit dated 12/30/2022 with Edward [name], AG-NP.  Weight 146.7 pounds, heart rate 87 bpm, BP 140/70, O2 99% BMP glucose 120, creatinine 1.1, GFR 64.4 WBC 5.27, hemoglobin 12.4, hematocrit 38.3  Given exertional fatigue and vague atypical chest discomfort was recommended to follow-up with cardiology.  EKG independently reviewed from 12/30/2022 demonstrated normal sinus rhythm with no acute ST/T wave changes.  Alver Sorrow, NP

## 2022-12-30 NOTE — Telephone Encounter (Signed)
Patient c/o Palpitations:  High priority if patient c/o lightheadedness, shortness of breath, or chest pain  How long have you had palpitations/irregular HR/ Afib? Are you having the symptoms now? Since last Wednesday, describes it as a "feeling" in chest that occurs every 30 seconds or so  Are you currently experiencing lightheadedness, SOB or CP? No   Do you have a history of afib (atrial fibrillation) or irregular heart rhythm? Yes  Have you checked your BP or HR? (document readings if available): 127/66   Are you experiencing any other symptoms? No    Patient wants to come in for an appt today to have heart listened too. Denies the feeling being pain or pressure. He states he got sick Wednesday night 05/08 with what he believes was the flu due to testing negative for covid with a home test. Other symptoms have subsided, but the feeling in chest started same day and is still present. Please advise.

## 2022-12-30 NOTE — Telephone Encounter (Signed)
Spoke to patient he stated he just saw PCP and was advised to see Dr.Jordan.Advised Dr.Jordan out of office this week.Appointment scheduled with [name] tomorrow 5/14 at 11:20 am at [facility] office.

## 2022-12-30 NOTE — Progress Notes (Signed)
Office Visit    Patient Name: Edward [name] Date of Encounter: 12/30/2022  PCP:  Edward Greathouse, MD   [facility]  Cardiologist:  Edward Swaziland, MD  Advanced Practice Provider:  No care team member to display Electrophysiologist:  None      Chief Complaint    Edward [name] is a 82 y.o. male presents today for chest pain, palpitations   Past Medical History    Past Medical History:  Diagnosis Date   Arthritis    right hand,  left foot   At risk for sleep apnea    STOP-BANG = 4    SENT TO PCP 02-10-215   BPH (benign prostatic hypertrophy)    Coronary artery disease CARIOLOGIST-- DR Edward [name]   ANGIOPLASTY TO LAD   COVID 2022   mild case   Dysrhythmia    A-fib   Elevated PSA    Frequency of urination    History of kidney stones    Hyperlipidemia    Hypertension    Nocturia    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 Lalla [name]   Spermatocele    left   Stroke [facility])    TIA's   Urgency of urination    Vertigo    Past Surgical History:  Procedure Laterality Date   BALLOON DILATION N/A 07/22/2022   Procedure: Varney Biles BALLOON DILATION;  Surgeon: Edward Field, MD;  Location: [facility];  Service: Urology;  Laterality: N/A;  60 MINS   CORONARY ANGIOPLASTY  1995  DR [name]   LAD   CYSTOSCOPY N/A 09/18/2015   Procedure: CYSTOSCOPY;  Surgeon: Edward Field, MD;  Location: [facility];  Service: Urology;  Laterality: N/A;   CYSTOSCOPY WITH RETROGRADE PYELOGRAM, URETEROSCOPY AND STENT PLACEMENT Left 03/01/2013   Procedure: CYSTOSCOPY WITH LEFT RETROGRADE PYELOGRAM, LEFT URETEROSCOPY and stent PLACEMENT;  Surgeon: Edward Haste, MD;  Location: [facility];  Service: Urology;  Laterality: Left;   EYE SURGERY Left    cataract surgery   FOOT FUSION Left    SECONDARY TO FX'S   GREEN LIGHT LASER TURP (TRANSURETHRAL RESECTION OF PROSTATE N/A 10/04/2013   Procedure:  GREEN LIGHT LASER TURP (TRANSURETHRAL RESECTION OF PROSTATE;  Surgeon: Edward Haste, MD;  Location: [facility];  Service: Urology;  Laterality: N/A;   INGUINAL HERNIA REPAIR Bilateral    LEFT ATRIAL APPENDAGE OCCLUSION N/A 01/09/2022   Procedure: LEFT ATRIAL APPENDAGE OCCLUSION;  Surgeon: Edward Prude, MD;  Location: [facility];  Service: Cardiovascular;  Laterality: N/A;   LOOP RECORDER INSERTION N/A 04/20/2018   Procedure: LOOP RECORDER INSERTION;  Surgeon: Edward Maw, MD;  Location: [facility];  Service: Cardiovascular;  Laterality: N/A;   SHOULDER ARTHROSCOPY Right 08/18/2002   SHOULDER ARTHROSCOPY WITH DEBRIDEMENT AND BICEP TENDON REPAIR Left 04/28/2011   SPERMATOCELECTOMY Left 10/04/2013   Procedure: LEFT SPERMATOCELECTOMY;  Surgeon: Edward Haste, MD;  Location: [facility];  Service: Urology;  Laterality: Left;   TEE WITHOUT CARDIOVERSION N/A 02/16/2018   Procedure: TRANSESOPHAGEAL ECHOCARDIOGRAM (TEE);  Surgeon: Edward Nose, MD;  Location: [facility] ENDOSCOPY;  Service: Cardiovascular;  Laterality: N/A;   TEE WITHOUT CARDIOVERSION N/A 01/09/2022   Procedure: TRANSESOPHAGEAL ECHOCARDIOGRAM (TEE);  Surgeon: Edward Prude, MD;  Location: [facility] INVASIVE CV LAB;  Service: Cardiovascular;  Laterality: N/A;   TRANSURETHRAL RESECTION OF PROSTATE N/A 12/11/2014   Procedure: CYSTOSCOPY, CLOT EVACUATION, WITH FULGURATION ;  Surgeon:  Edward Pippin, MD;  Location: [facility];  Service: Urology;  Laterality: N/A;   TRANSURETHRAL RESECTION OF PROSTATE N/A 09/18/2015   Procedure: TRANSURETHRAL RESECTION OF THE PROSTATE (TURP);  Surgeon: Edward Field, MD;  Location: [facility];  Service: Urology;  Laterality: N/A;   UMBILICAL HERNIA REPAIR  06/02/2008   UMBILICAL HERNIA REPAIR N/A 11/12/2021   Procedure: UMBILICAL HERNIA REPAIR;  Surgeon: Edward Miyamoto, MD;  Location: [facility];  Service: General;  Laterality: N/A;  LMA    Allergies  Allergies  Allergen Reactions    Augmentin [Amoxicillin-Pot Clavulanate] Diarrhea   Codeine Nausea Only   Levaquin [Levofloxacin]     Other reaction(s): severe stomach pain   Zocor [Simvastatin]     Other reaction(s): myalgia    History of Present Illness    Edward [name] is a 82 y.o. 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  After TIA May 2019 workup revealed small PFO. Seen by Dr. Excell [name] and neurology, PFO closure not felt to be indicated. Loop recorder placed which noted atrial fibrillation. Eleiquis initiated. He eventually underwent Watchman devivce with Dr. Lalla [name] May 2023. No known recurrent angina since prior intervention in 1995. Beta blocker (Atenolol) previously discontinued due to bradycardia, dizziness.   Presents today after contacting the office noting chest pain and palpitations. Notes last Wednesday he was out mowing the yard, came in to shower and eat. Then felt fatigued and had to go to bed at 5pm. Tells me "I was sick big time" with COVID and flu-like symptoms. Negative COVID home test. Went to ED 12/26/22 but did not stay be evaluated. Felt weak, poor appetite. Notes this gradually got better. Still feels he is not quite back to normal. He saw PCP earlier today with EKG, CXR, labs collected which are not available for my review. Was told CXR and EKG looked good.   Describes a "feeling" in his left chest. He is not certain whether it was a flutter or a pain. Tells me he cannot describe the sensation. Tells me "just want a stethoscope on me" to check on things. He is particularly worried about his valve. Reports no shortness of breath nor dyspnea on exertion.  No edema, orthopnea, PND.   EKGs/Labs/Other Studies Reviewed:   The following studies were reviewed today: Cardiac Studies & Procedures     STRESS TESTS  EXERCISE TOLERANCE TEST (ETT) 09/13/2018   ECHOCARDIOGRAM  ECHOCARDIOGRAM COMPLETE 12/16/2021  Narrative ECHOCARDIOGRAM  REPORT    Patient Name:   Edward [name] Date of Exam: 12/16/2021 Medical Rec #:  [id number]       Height:       66.0 in Accession #:    [id number]      Weight:       150.0 lb Date of Birth:  06/04/1941       BSA:          1.770 m Patient Age:    81 years        BP:           145/69 mmHg Patient Gender: M               HR:           62 bpm. Exam Location:  [address]  Procedure: 2D Echo, 3D Echo, Cardiac Doppler and Color Doppler  Indications:    I48.91 Atrial Fibrillation  History:        Patient has  prior history of Echocardiogram examinations, most recent 01/15/2018. Stroke; Risk Factors:Hypertension and HLD.  Sonographer:    Clearence Ped RCS Referring Phys: [id number] Rossie Muskrat LAMBERT  IMPRESSIONS   1. The aortic valve is tricuspid. Aortic valve regurgitation is at least moderate- there are two separate jets. 2. Left ventricular ejection fraction, by estimation, is 55 to 60%. Left ventricular ejection fraction by 3D volume is 57 %. The left ventricle has normal function. The left ventricle has no regional wall motion abnormalities. Left ventricular diastolic parameters are consistent with Grade I diastolic dysfunction (impaired relaxation). 3. Right ventricular systolic function is normal. The right ventricular size is normal. There is normal pulmonary artery systolic pressure. The estimated right ventricular systolic pressure is 23.8 mmHg. 4. The mitral valve is normal in structure. Mild mitral valve regurgitation. Moderate mitral annular calcification.  Comparison(s): No significant change from prior study.  Conclusion(s)/Recommendation(s): At least moderate aortic regurgitation (multiple jets) with slight increase in LV size and similar LV function from prior. Consider CMR for quantification vs earlier interval follow up.  FINDINGS Left Ventricle: Left ventricular ejection fraction, by estimation, is 55 to 60%. Left ventricular ejection fraction by 3D volume is 57 %. The  left ventricle has normal function. The left ventricle has no regional wall motion abnormalities. The left ventricular internal cavity size was normal in size. There is no left ventricular hypertrophy. Left ventricular diastolic parameters are consistent with Grade I diastolic dysfunction (impaired relaxation).  Right Ventricle: The right ventricular size is normal. No increase in right ventricular wall thickness. Right ventricular systolic function is normal. There is normal pulmonary artery systolic pressure. The tricuspid regurgitant velocity is 2.28 m/s, and with an assumed right atrial pressure of 3 mmHg, the estimated right ventricular systolic pressure is 23.8 mmHg.  Left Atrium: Left atrial size was normal in size.  Right Atrium: Right atrial size was normal in size.  Pericardium: There is no evidence of pericardial effusion.  Mitral Valve: The mitral valve is normal in structure. Moderate mitral annular calcification. Mild mitral valve regurgitation.  Tricuspid Valve: The tricuspid valve is normal in structure. Tricuspid valve regurgitation is not demonstrated. No evidence of tricuspid stenosis.  Aortic Valve: The aortic valve is tricuspid. Aortic valve regurgitation is moderate. Aortic regurgitation PHT measures 348 msec.  Pulmonic Valve: The pulmonic valve was normal in structure. Pulmonic valve regurgitation is mild to moderate. No evidence of pulmonic stenosis.  Aorta: The aortic root and ascending aorta are structurally normal, with no evidence of dilitation.  IAS/Shunts: No atrial level shunt detected by color flow Doppler.  Additional Comments: A device lead is visualized.   LEFT VENTRICLE PLAX 2D LVIDd:         5.60 cm         Diastology LVIDs:         3.70 cm         LV e' medial:    9.68 cm/s LV PW:         1.00 cm         LV E/e' medial:  9.7 LV IVS:        0.80 cm         LV e' lateral:   10.70 cm/s LVOT diam:     2.10 cm         LV E/e' lateral: 8.8 LV SV:          81 LV SV Index:   46 LVOT Area:     3.46 cm  3D Volume EF LV 3D EF:    Left ventricul ar ejection fraction by 3D volume is 57 %.  3D Volume EF: 3D EF:        57 % LV EDV:       139 ml LV ESV:       60 ml LV SV:        79 ml  RIGHT VENTRICLE RV Basal diam:  3.70 cm RV S prime:     15.90 cm/s TAPSE (M-mode): 2.4 cm RVSP:           23.8 mmHg  LEFT ATRIUM             Index        RIGHT ATRIUM           Index LA diam:        3.00 cm 1.70 cm/m   RA Pressure: 3.00 mmHg LA Vol (A2C):   49.9 ml 28.20 ml/m  RA Area:     17.20 cm LA Vol (A4C):   57.7 ml 32.61 ml/m  RA Volume:   49.80 ml  28.14 ml/m LA Biplane Vol: 54.5 ml 30.80 ml/m AORTIC VALVE LVOT Vmax:   110.00 cm/s LVOT Vmean:  67.100 cm/s LVOT VTI:    0.233 m AI PHT:      348 msec  AORTA Ao Root diam: 3.50 cm Ao Asc diam:  3.70 cm  MITRAL VALVE               TRICUSPID VALVE MV Area (PHT):             TR Peak grad:   20.8 mmHg MV Decel Time:             TR Vmax:        228.00 cm/s MV E velocity: 94.30 cm/s  Estimated RAP:  3.00 mmHg MV A velocity: 76.50 cm/s  RVSP:           23.8 mmHg MV E/A ratio:  1.23 SHUNTS Systemic VTI:  0.23 m Systemic Diam: 2.10 cm  Riley Lam MD Electronically signed by Riley Lam MD Signature Date/Time: 12/16/2021/5:06:42 PM    Final   TEE  ECHO TEE 01/09/2022  Narrative TRANSESOPHOGEAL ECHO REPORT    Patient Name:   Juanetta Beets Date of Exam: 01/09/2022 Medical Rec #:  [id number]       Height:       66.0 in Accession #:    [id number]      Weight:       150.0 lb Date of Birth:  Oct 04, 1940       BSA:          1.770 m Patient Age:    81 years        BP:           161/86 mmHg Patient Gender: M               HR:           62 bpm. Exam Location:  Inpatient  Procedure: Transesophageal Echo, Cardiac Doppler, Color Doppler and 3D Echo  Indications:     Afib  History:         Patient has prior history of Echocardiogram examinations, most recent  12/16/2021. CAD; Risk Factors:Hypertension and Dyslipidemia. 27mm Watchman placed on 01/09/22.  Sonographer:     Leta Jungling RDCS Referring Phys:  [id number] Edward [name] Diagnosing Phys: Riley Lam MD  PROCEDURE: After discussion of the risks and  benefits of a TEE, an informed consent was obtained from the patient. The patient was intubated. The transesophogeal probe was passed without difficulty through the esophogus of the patient. Imaged were obtained with the patient in a supine position. Sedation performed by different physician. The patient was monitored while under deep sedation. Anesthestetic sedation was provided intravenously by Anesthesiology: 130mg  of Propofol, 50mg  of Lidocaine. Image quality was good. The patient's vital signs; including heart rate, blood pressure, and oxygen saturation; remained stable throughout the procedure. The patient developed no complications during the procedure.  IMPRESSIONS   1. Interventional TEE for LAA-O Procedure. 2. Prior to procedure, no left atrial appendage thrombus was noted. Maximal diameter 21 mm, mean diameter 18 mm. 3. Mid- Mid transeptal puncture with no issues. 4. Initial deployment of a 27 mm Watchman FLX device with moderate mitral shoulder and slight leak, re-captured and redeployed. 5. Final deployment- 27 mm Watchman device. No peri-device leak. Small mitral shoulder. Mean device diamter 2.24 cm; 16% compressed. 6. Post procedure left to right shunting from transeptal puncture. No significant pericardial effusion. 7. Left ventricular ejection fraction, by estimation, is 50%. The left ventricle has low normal function. 8. Right ventricular systolic function is normal. The right ventricular size is normal. 9. Aortic dilatation noted. 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 10. The aortic valve is tricuspid. 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 There is near holo-diastolic flow  reversal in the ascending aorta. There are multiple jets. Pressure half time of largest jet 286 msec. 11. The mitral valve is normal in structure. Mild mitral valve regurgitation. No evidence of mitral stenosis.  FINDINGS Left Ventricle: Left ventricular ejection fraction, by estimation, is 50%. The left ventricle has low normal function. The left ventricular internal cavity size was normal in size. There is no left ventricular hypertrophy.  Right Ventricle: The right ventricular size is normal. No increase in right ventricular wall thickness. Right ventricular systolic function is normal.  Left Atrium: Left atrial size was normal in size. No left atrial/left atrial appendage thrombus was detected.  Right Atrium: Right atrial size was normal in size.  Pericardium: Trivial pericardial effusion is present.  Mitral Valve: The mitral valve is normal in structure. Mild mitral valve regurgitation. No evidence of mitral valve stenosis.  Tricuspid Valve: The tricuspid valve is normal in structure. Tricuspid valve regurgitation is trivial. No evidence of tricuspid stenosis.  Aortic Valve: The aortic valve is tricuspid. 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  Pulmonic Valve: The pulmonic valve was grossly normal. Pulmonic valve regurgitation is not visualized. No evidence of pulmonic stenosis.  Aorta: Aortic dilatation noted. There is moderate dilatation of the aortic root, measuring 45 mm.  IAS/Shunts: No atrial level shunt detected by color flow Doppler.   AORTIC VALVE LVOT Vmax:         86.70 cm/s LVOT Vmean:        49.500 cm/s LVOT VTI:          0.188 m AR Vena Contracta: 0.80 cm  AORTA Ao Root diam: 4.50 cm  MR PISA: 1.57 cm SHUNTS Systemic VTI: 0.19 m  Riley Lam MD Electronically signed by Riley Lam MD Signature Date/Time: 01/09/2022/2:41:28 PM    Final             EKG:  EKG is not ordered today.   Recent Labs: 07/15/2022: BUN 12;  Creatinine, Ser 0.95; Hemoglobin 12.8; Platelets 191; Potassium 4.1; Sodium 139  Recent Lipid Panel  Component Value Date/Time   CHOL 117 01/15/2018 0453   TRIG 64 01/15/2018 0453   HDL 41 01/15/2018 0453   CHOLHDL 2.9 01/15/2018 0453   VLDL 13 01/15/2018 0453   LDLCALC 63 01/15/2018 0453    Risk Assessment/Calculations:   CHA2DS2-VASc Score = 6   This indicates a 9.7% annual risk of stroke. The patient's score is based upon: CHF History: 0 HTN History: 1 Diabetes History: 0 Stroke History: 2 Vascular Disease History: 1 Age Score: 2 Gender Score: 0     Home Medications   Current Meds  Medication Sig   acetaminophen (TYLENOL) 500 MG tablet Take 500 mg by mouth every 6 (six) hours as needed for moderate pain or mild pain.   amLODipine (NORVASC) 5 MG tablet Take 1 tablet (5 mg total) by mouth daily.   aspirin EC 81 MG tablet Take 81 mg by mouth daily. Swallow whole.   budesonide-formoterol (SYMBICORT) 160-4.5 MCG/ACT inhaler Inhale 1 puff into the lungs 2 (two) times daily as needed (chest tightness).   Cholecalciferol (VITAMIN D3 PO) Take 1 tablet by mouth every other day.   Coenzyme Q10 (CO Q 10 PO) Take 300 mg by mouth daily at 12 noon.   Cyanocobalamin (VITAMIN B-12 PO) Take 1 tablet by mouth every other day.   losartan (COZAAR) 100 MG tablet Take 1 tablet (100 mg total) by mouth daily.   MAGNESIUM CITRATE PO Take 250 mg by mouth every other day.   Multiple Vitamin (MULTIVITAMIN WITH MINERALS) TABS tablet Take 1 tablet by mouth every other day.   Multiple Vitamins-Minerals (ZINC PO) Take 1 tablet by mouth every other day.   Probiotic Product (PROBIOTIC PO) Take 1 capsule by mouth in the morning.   rosuvastatin (CRESTOR) 20 MG tablet Take 1 tablet (20 mg total) by mouth every evening.   tamsulosin (FLOMAX) 0.4 MG CAPS capsule Take 0.4 mg by mouth at bedtime.   VITAMIN E PO Take 1 tablet by mouth every other day.     Review of Systems      All other systems reviewed  and are otherwise negative except as noted above.  Physical Exam    VS:  BP 122/74   Pulse 82   Ht 5\' 6"  (1.676 m)   Wt 147 lb (66.7 kg)   BMI 23.73 kg/m  , BMI Body mass index is 23.73 kg/m.  Wt Readings from Last 3 Encounters:  12/30/22 147 lb (66.7 kg)  12/25/22 152 lb (68.9 kg)  12/16/22 150 lb 12.8 oz (68.4 kg)    GEN: Well nourished, well developed, in no acute distress. HEENT: normal. Neck: Supple, no JVD, carotid bruits, or masses. Cardiac: RRR, no murmurs, rubs, or gallops. No clubbing, cyanosis, edema.  Radials/PT 2+ and equal bilaterally.  Respiratory:  Respirations regular and unlabored, clear to auscultation bilaterally. GI: Soft, nontender, nondistended. MS: No deformity or atrophy. Skin: Warm and dry, no rash. Neuro:  Strength and sensation are intact. Psych: Normal affect.  Assessment & Plan    Chest pain / Palpitations - Chest pain and palpitations in setting of viral illness which is improving. Atypical for angina as occurred at rest. Anticipate palpitations related to illness, dehydration. No indication for ischemic evaluation. Reassurance provided that if AI worsened would be more persistent symptoms and echo upcoming Friday for monitoring.   CAD s/p remote PTCA of LAD 1995 - No exertional chest discomfort. No indication for ischemic evaluation. EKG at PCP earlier today unremarkable per his report - not available  for my review but we have requested. GDMT Aspirin, Amlodipine, Rosuvastatin.   Paroxysmal atrial fibrillation-s/p Watchman device.  No beta-blocker due to previous bradycardia, dizziness. RRR on auscultation today. Per his report EKG earlier today at PCP was unremarkable.   HLD, LDL goal less than 70-continue Crestor  HTN- BP well controlled. Continue current antihypertensive regimen.  Discussed to monitor BP at home at least 2 hours after medications and sitting for 5-10 minutes.   Chronic AI-moderate to severe by TEE 01/09/2022.  He has upcoming  echocardiogram Friday of this week for continued monitoring.  Recommend optimal blood pressure control.       Disposition: Follow up in October 2024  with Edward Swaziland, MD or APP.  Signed, Alver Sorrow, NP 12/30/2022, 2:14 PM [facility]

## 2022-12-30 NOTE — Telephone Encounter (Signed)
Had watchman about a year ago Dr Swaziland said that he has a heart valve issue, has an echo scheduled Friday  Last Wednesday- Friday- got sick. "Was sick as a dog." Was negative for Covid with a home test. Went to ER- left w/o being seen because he was there for 4 hrs. He said, "Never go to the Emergency Room, you will just sit there and die."  He reports feeling "something" on the left side of chest- Pain rated at a 2, it comes and goes sporadically. (He thought it was associated with him being sick last week). This has been happening for the last few days- this discomfort was not there before he got sick. It is a little "twinge" for a second or two- happens anywhere between every  15 seconds to 5 minutes or so. He reports that this pain happens whether he is active or not active. It is not accompanied by any other s/s- No sob, no sweating, no n/v, no other pains.   BP this morning 140/66- diastolic is a little lower than normal. He wants to come in to be seen- just wants someone to listen to his chest. He is worried about this "valve"  There are not any appt's available before his Echo and told him that he cannot just drop in and have a provider listen to his chest, unfortunately.   Told him he could go to the ER to be evaluated; Given ER precautions. Told him that I would also send all this information to his provider. He verbalized understanding.

## 2022-12-30 NOTE — Telephone Encounter (Signed)
Called to discuss s/s; no answer, left message and call back number.

## 2022-12-31 ENCOUNTER — Encounter (HOSPITAL_BASED_OUTPATIENT_CLINIC_OR_DEPARTMENT_OTHER): Payer: Self-pay

## 2023-01-02 ENCOUNTER — Ambulatory Visit (HOSPITAL_COMMUNITY): Payer: Medicare Other | Attending: Internal Medicine

## 2023-01-02 DIAGNOSIS — I480 Paroxysmal atrial fibrillation: Secondary | ICD-10-CM

## 2023-01-02 DIAGNOSIS — I351 Nonrheumatic aortic (valve) insufficiency: Secondary | ICD-10-CM

## 2023-01-02 DIAGNOSIS — Z95818 Presence of other cardiac implants and grafts: Secondary | ICD-10-CM

## 2023-01-02 DIAGNOSIS — I2510 Atherosclerotic heart disease of native coronary artery without angina pectoris: Secondary | ICD-10-CM | POA: Diagnosis present

## 2023-01-02 DIAGNOSIS — I10 Essential (primary) hypertension: Secondary | ICD-10-CM | POA: Diagnosis present

## 2023-01-02 LAB — ECHOCARDIOGRAM COMPLETE
AR max vel: 2.23 cm2
AV Area VTI: 2.3 cm2
AV Area mean vel: 2.18 cm2
AV Mean grad: 6 mmHg
AV Peak grad: 10.9 mmHg
Ao pk vel: 1.65 m/s
Area-P 1/2: 4.54 cm2
P 1/2 time: 376 msec
S' Lateral: 4.6 cm

## 2023-01-06 ENCOUNTER — Telehealth: Payer: Self-pay | Admitting: Cardiology

## 2023-01-06 NOTE — Telephone Encounter (Signed)
Patient wants to know if he has any restrictions he needs to be concerned about (i.e. no heavy lifting, no yard mowing, etc.)

## 2023-01-06 NOTE — Telephone Encounter (Signed)
Spoke to patient Dr.Jordan's advice given. 

## 2023-03-28 ENCOUNTER — Other Ambulatory Visit: Payer: Self-pay | Admitting: Cardiology

## 2023-03-28 NOTE — Progress Notes (Unsigned)
Cardiology Office Note Date:  03/30/2023   ID:  TAWHID CORSO, DOB [birth year], MRN [id number]  PCP:  [name], MD  Cardiologist:  Lilyann Gravelle [place] MD  Chief Complaint  Patient presents with   Atrial Fibrillation   Coronary Artery Disease   aortic insufficiency   History of Present Illness: Edward [name] is a 82 y.o. male who is seen for evaluation of CAD.   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  His evaluation included an echocardiogram, carotid ultrasound, CT and MRI studies of the brain.  All of these were unrevealing.  There was no evidence of cortical infarction on his neuroimaging studies.  He had no arrhythmia documented.  His  TEE demonstrated a small PFO. He was seen by Dr. Excell Seltzer for consideration of PFO closure but after discussion with Neurology this was not felt to be indication. He subsequently had an implantable loop recorder placed by Dr Ladona Ridgel. He had a single  episode of AFib and he was started on Eliquis. 2 days after starting Eliquis he developed a hematoma in his left shoulder. Eliquis was reduced for 2 weeks then increase back to 5 mg bid. Plavix was stoppped.   He has a history of CAD with remote angioplasty of the LAD in 1995.  He really has never had recurrent angina. He does have  a history of vertigo. Last loop recorder check on Dec 29 showed only one episode of Afib lasting minutes.  He was seen in January with transient confusion of unclear etiology. Carotid dopplers showed no significant stenosis. Seen by Dr Everlena Cooper. EEG on 10/15/2020 was normal.  MRI of brain without contrast on 10/30/2020  showed moderate  Cerebral chronic small vessel ischemic changes but no stroke or other acute intracranial abnormalities. We discussed his PFO but given uncertainty regarding diagnosis did not feel that closure of PFO was indicated.   He was then considered for a Watchman device in order not to have  to be on anticoagulation. He was seen by Dr Lalla Brothers and underwent left atrial appendage occlusive device placement with Watchman FLX 27mm in May.   He was seen in May by [name] for some atypical chest pain and palpitations following an acute viral illness. States he woke up with flu like symptoms. Home Covid test was negative. Was so weak he couldn't stand. Went to ED but left after not being seen for 4 hours. Was seen in our office and Ecg was stable.  He did have follow up Echo which showed some reduction in LV function with moderate to severe AI and multiple jets. Watchman device was stable.   On follow up today he is doing Ok. He is doing his yard work. He denies any chest pain, dyspnea, palpitations, edema. He does stay tired a lot and doesn't sleep well.     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 Lalla Brothers   Spermatocele    left   Stroke [facility])    TIA's   Urgency of urination    Vertigo     Past Surgical History:  Procedure Laterality Date   BALLOON DILATION N/A 07/22/2022   Procedure: Varney Biles BALLOON DILATION;  Surgeon: Jerilee Field, MD;  Location: [facility];  Service: Urology;  Laterality: N/A;  60 MINS   CORONARY ANGIOPLASTY  1995  DR [place]   LAD   CYSTOSCOPY N/A 09/18/2015   Procedure: CYSTOSCOPY;  Surgeon: Jerilee Field, MD;  Location: [facility];  Service: Urology;  Laterality: N/A;   CYSTOSCOPY WITH RETROGRADE PYELOGRAM, URETEROSCOPY AND STENT PLACEMENT Left 03/01/2013    Procedure: CYSTOSCOPY WITH LEFT RETROGRADE PYELOGRAM, LEFT URETEROSCOPY and stent PLACEMENT;  Surgeon: Antony Haste, MD;  Location[facility];  Service: Urology;  Laterality: Left;   EYE SURGERY Left    cataract surgery   FOOT FUSION Left    SECONDARY TO FX'S   GREEN LIGHT LASER TURP (TRANSURETHRAL RESECTION OF PROSTATE N/A 10/04/2013   Procedure: GREEN LIGHT LASER TURP (TRANSURETHRAL RESECTION OF PROSTATE;  Surgeon: Antony Haste, MD;  Location: [facility];  Service: Urology;  Laterality: N/A;   INGUINAL HERNIA REPAIR Bilateral    LEFT ATRIAL APPENDAGE OCCLUSION N/A 01/09/2022   Procedure: LEFT ATRIAL APPENDAGE OCCLUSION;  Surgeon: Lanier Prude, MD;  Location: [facility];  Service: Cardiovascular;  Laterality: N/A;   LOOP RECORDER INSERTION N/A 04/20/2018   Procedure: LOOP RECORDER INSERTION;  Surgeon: Marinus Maw, MD;  Location: [facility];  Service: Cardiovascular;  Laterality: N/A;   SHOULDER ARTHROSCOPY Right 08/18/2002   SHOULDER ARTHROSCOPY WITH DEBRIDEMENT AND BICEP TENDON REPAIR Left 04/28/2011   SPERMATOCELECTOMY Left 10/04/2013   Procedure: LEFT SPERMATOCELECTOMY;  Surgeon: Antony Haste, MD;  Location: [facility];  Service: Urology;  Laterality: Left;   TEE WITHOUT CARDIOVERSION N/A 02/16/2018   Procedure: TRANSESOPHAGEAL ECHOCARDIOGRAM (TEE);  Surgeon: Chrystie Nose, MD;  Location: [facility]OSCOPY;  Service: Cardiovascular;  Laterality: N/A;   TEE WITHOUT CARDIOVERSION N/A 01/09/2022   Procedure: TRANSESOPHAGEAL ECHOCARDIOGRAM (TEE);  Surgeon: Lanier Prude, MD;  Location: [facility] INVASIVE CV LAB;  Service: Cardiovascular;  Laterality: N/A;   TRANSURETHRAL RESECTION OF PROSTATE N/A 12/11/2014   Procedure: CYSTOSCOPY, CLOT EVACUATION, WITH FULGURATION ;  Surgeon: Bjorn Pippin, MD;  Location: [facility];  Service: Urology;  Laterality: N/A;   TRANSURETHRAL RESECTION OF PROSTATE N/A 09/18/2015   Procedure: TRANSURETHRAL  RESECTION OF THE PROSTATE (TURP);  Surgeon: Jerilee Field, MD;  Location: [facility];  Service: Urology;  Laterality: N/A;   UMBILICAL HERNIA REPAIR  06/02/2008   UMBILICAL HERNIA REPAIR N/A 11/12/2021   Procedure: UMBILICAL HERNIA REPAIR;  Surgeon: Abigail Miyamoto, MD;  Location: [facility];  Service: General;  Laterality: N/A;  LMA    Current Outpatient Medications  Medication Sig Dispense Refill   acetaminophen (TYLENOL) 500 MG tablet Take 500 mg by mouth every 6 (six) hours as needed for moderate pain or mild pain.     amLODipine (NORVASC) 5 MG tablet Take 1 tablet (5 mg total) by mouth daily. 90 tablet 3   aspirin EC 81 MG tablet Take 81 mg by mouth daily. Swallow whole.     budesonide-formoterol (SYMBICORT) 160-4.5 MCG/ACT inhaler Inhale 1 puff into the lungs as needed (chest tightness).     Cholecalciferol (VITAMIN  D3 PO) Take 1 tablet by mouth every other day.     Coenzyme Q10 (CO Q 10 PO) Take 300 mg by mouth daily at 12 noon.     Cyanocobalamin (VITAMIN B-12 PO) Take 1 tablet by mouth every other day.     losartan (COZAAR) 100 MG tablet Take 1 tablet (100 mg total) by mouth daily. 90 tablet 3   MAGNESIUM CITRATE PO Take 250 mg by mouth every other day.     Multiple Vitamin (MULTIVITAMIN WITH MINERALS) TABS tablet Take 1 tablet by mouth every other day.     rosuvastatin (CRESTOR) 20 MG tablet Take 1 tablet (20 mg total) by mouth every evening. 90 tablet 3   tamsulosin (FLOMAX) 0.4 MG CAPS capsule Take 0.4 mg by mouth at bedtime.     VITAMIN E PO Take 1 tablet by mouth every other day.     No current facility-administered medications for this visit.    Allergies:   Augmentin [amoxicillin-pot clavulanate], Codeine, Levaquin [levofloxacin], and Zocor [simvastatin]   Social History:  The patient  reports that he quit smoking about 59 years ago. His smoking use included cigarettes. He started smoking about 62 years ago. He has a 1.5 pack-year smoking history. He has never used smokeless  tobacco. He reports current alcohol use. He reports that he does not use drugs.   Family History:  The patient's family history is not on file.    ROS:  Please see the history of present illness.  All other systems are reviewed and negative.    PHYSICAL EXAM: VS:  BP 138/60 (BP Location: Left Arm, Patient Position: Sitting, Cuff Size: Normal)   Pulse 68   Ht 5\' 7"  (1.702 m)   Wt 148 lb 12.8 oz (67.5 kg)   SpO2 97%   BMI 23.31 kg/m  , BMI Body mass index is 23.31 kg/m. GEN: Well nourished, well developed, in no acute distress  HEENT: normal  Neck: no JVD, no masses. No carotid bruits Cardiac: RRR  No gallop. There is a gr 3/6 holo-diastolic murmur LSB to apex.        Respiratory:  clear to auscultation bilaterally, normal work of breathing GI: soft, nontender, nondistended, + BS MS: no deformity or atrophy. There is a large mature bruise in the left shoulder and upper arm. Ext: no pretibial edema, pedal pulses 2+= bilaterally Skin: warm and dry, no rash Neuro:  Strength and sensation are intact Psych: euthymic mood, full affect  EKG:  EKG is not ordered today.      Recent Labs: 07/15/2022: BUN 12; Creatinine, Ser 0.95; Hemoglobin 12.8; Platelets 191; Potassium 4.1; Sodium 139   Lipid Panel     Component Value Date/Time   CHOL 117 01/15/2018 0453   TRIG 64 01/15/2018 0453   HDL 41 01/15/2018 0453   CHOLHDL 2.9 01/15/2018 0453   VLDL 13 01/15/2018 0453   LDLCALC 63 01/15/2018 0453   Dated 04/09/18: cholesterol 134, triglycerides 87, HDL 45, LDL 72. CBC, chemistries, TSH normal. Dated 04/21/19: cholesterol 118, triglycerides 66, HDL 40, LDL 65. CMET, CBC, TSH normal Dated 04/25/20: cholesterol 120, triglycerides 46, HDL 44, LDL 67.  Dated 09/24/20: normal CMET Dated 02/24/22: cholesterol 111, triglycerides 79, HDL 47, LDL 49. CMET and TSH normal Dated 04/15/22: Hgb 11.2.   Wt Readings from Last 3 Encounters:  03/30/23 148 lb 12.8 oz (67.5 kg)  12/30/22 147 lb (66.7 kg)   12/25/22 152 lb (68.9 kg)     Cardiac Studies Reviewed:  TEE 02-16-2018: Study Conclusions   - Left ventricle: The cavity size was normal. There was mild   concentric hypertrophy. Systolic function was normal. The   estimated ejection fraction was in the range of 55% to 60%. Wall   motion was normal; there were no regional wall motion   abnormalities. - Aortic valve: Trileaflet. Mild to moderate eccentric AI, 2 jets,   the larger jet is toward the anterior mitral valve leaflet. - Aorta: Dilated sinus of valsalva to 4.1 cm. - Left atrium: Mildly dilated. No evidence of thrombus in the   atrial cavity or appendage. - Right atrium: No evidence of thrombus in the atrial cavity or   appendage. - Atrial septum: There is a small PFO by saline microbubble   contrast which demonstrates intermittent right to left flow.   Impressions:   - Small PFO with intermittent right to left flow noted by saline   microbubble contrast. This can be a possible mechanism for his   TIA. Consider referral to Dr. Excell Seltzer with the structural heart   clinic for closure evaluation.  Echo 01/02/23:  IMPRESSIONS     1. Left ventricular ejection fraction, by estimation, is 40 to 45%. The  left ventricle has mildly decreased function. The left ventricle  demonstrates global hypokinesis. The left ventricular internal cavity size  was mildly dilated. Left ventricular  diastolic parameters are consistent with Grade II diastolic dysfunction  (pseudonormalization).   2. Right ventricular systolic function is normal. The right ventricular  size is normal.   3. Left atrial size was severely dilated.   4. No obvious residual ASD from Watchman procedure noted on TTE.   5. Right atrial size was moderately dilated.   6. The mitral valve is normal in structure. Mild mitral valve  regurgitation. No evidence of mitral stenosis.   7. There are several jets of AI overall moderate to severe. . The aortic  valve is tricuspid.  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 No  aortic stenosis is present. Aortic regurgitation PHT measures 376 msec.   8. Aortic dilatation noted. There is borderline dilatation of the aortic  root, measuring 38 mm.   9. The inferior vena cava is normal in size with greater than 50%  respiratory variability, suggesting right atrial pressure of 3 mmHg.   Assessment/Plan:  1. CAD remote PTCA of the LAD in 1995. No recurrent angina. Continue amlodipine and statin. Continue ASA and statin therapy. Continue amlodipine.  2. Paroxysmal Afib. Noted on loop recorder. He is  asymptomatic but has had prior TIAs.  S/p placement of Watchman device. Off anticoagulation now. Did have significant hematuria resolved.  3. HLD  on Crestor- follow up lab work with Dr Felipa Eth this week.  4. HTN well controlled.  5. Transient confusion. Negative Neurologic evaluation.  6. 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 Moderate to severe by TTE - asymptomatic. LV function mildly impaired on recent Echo. I would like to repeat Echo in 6 months. If LV function worse will switch losartan to Entresto. He is not a good candidate for AVR at this point  I will follow up in 6 months   Signed, Kerline Trahan [place], MD  03/30/2023 1:30 PM    [facility] [address] [place], [place], [place]  [address] Phone: [phone]; Fax: [phone]

## 2023-03-30 ENCOUNTER — Ambulatory Visit: Payer: Medicare Other | Attending: Cardiology | Admitting: Cardiology

## 2023-03-30 ENCOUNTER — Encounter: Payer: Self-pay | Admitting: Cardiology

## 2023-03-30 VITALS — BP 138/60 | HR 68 | Ht 67.0 in | Wt 148.8 lb

## 2023-03-30 DIAGNOSIS — I480 Paroxysmal atrial fibrillation: Secondary | ICD-10-CM | POA: Insufficient documentation

## 2023-03-30 DIAGNOSIS — I25118 Atherosclerotic heart disease of native coronary artery with other forms of angina pectoris: Secondary | ICD-10-CM | POA: Diagnosis not present

## 2023-03-30 DIAGNOSIS — E785 Hyperlipidemia, unspecified: Secondary | ICD-10-CM | POA: Diagnosis not present

## 2023-03-30 DIAGNOSIS — I351 Nonrheumatic aortic (valve) insufficiency: Secondary | ICD-10-CM | POA: Diagnosis not present

## 2023-03-30 DIAGNOSIS — I10 Essential (primary) hypertension: Secondary | ICD-10-CM | POA: Insufficient documentation

## 2023-03-30 DIAGNOSIS — Z95818 Presence of other cardiac implants and grafts: Secondary | ICD-10-CM | POA: Diagnosis not present

## 2023-03-30 NOTE — Patient Instructions (Signed)
Medication Instructions:  Continue same medications *If you need a refill on your cardiac medications before your next appointment, please call your pharmacy*   Lab Work: None ordered   Testing/Procedures: Schedule Echo 09/2023   Follow-Up: At [facility], you and your health needs are our priority.  As part of our continuing mission to provide you with exceptional heart care, we have created designated Provider Care Teams.  These Care Teams include your primary Cardiologist (physician) and Advanced Practice Providers (APPs -  Physician Assistants and Nurse Practitioners) who all work together to provide you with the care you need, when you need it.  We recommend signing up for the patient portal called "MyChart".  Sign up information is provided on this After Visit Summary.  MyChart is used to connect with patients for Virtual Visits (Telemedicine).  Patients are able to view lab/test results, encounter notes, upcoming appointments, etc.  Non-urgent messages can be sent to your provider as well.   To learn more about what you can do with MyChart, go to [email or web address].    Your next appointment:  6 months   Call in Oct to schedule Feb appointment    Provider:  Dr.Jordan

## 2023-03-31 ENCOUNTER — Other Ambulatory Visit: Payer: Self-pay

## 2023-03-31 MED ORDER — AMLODIPINE BESYLATE 5 MG PO TABS: 5.0000 mg | ORAL_TABLET | Freq: Every day | ORAL | 3 refills | Status: DC

## 2023-03-31 MED ORDER — LOSARTAN POTASSIUM 100 MG PO TABS: 100.0000 mg | ORAL_TABLET | Freq: Every day | ORAL | 3 refills | Status: DC

## 2023-03-31 NOTE — Addendum Note (Signed)
Addended by: Margaret Pyle D on: 03/31/2023 11:42 AM   Modules accepted: Orders

## 2023-04-10 ENCOUNTER — Other Ambulatory Visit: Payer: Self-pay | Admitting: Cardiology

## 2023-05-06 ENCOUNTER — Emergency Department (HOSPITAL_COMMUNITY): Payer: Medicare Other

## 2023-05-06 ENCOUNTER — Telehealth: Payer: Self-pay

## 2023-05-06 ENCOUNTER — Emergency Department (HOSPITAL_COMMUNITY)
Admission: EM | Admit: 2023-05-06 | Discharge: 2023-05-06 | Disposition: A | Discharge status: Home / Self Care | Payer: Medicare Other | Attending: Emergency Medicine | Admitting: Emergency Medicine

## 2023-05-06 ENCOUNTER — Other Ambulatory Visit: Payer: Self-pay

## 2023-05-06 ENCOUNTER — Ambulatory Visit: Payer: Medicare Other | Admitting: General Practice

## 2023-05-06 ENCOUNTER — Encounter (HOSPITAL_COMMUNITY): Payer: Self-pay | Admitting: Emergency Medicine

## 2023-05-06 DIAGNOSIS — I10 Essential (primary) hypertension: Secondary | ICD-10-CM | POA: Insufficient documentation

## 2023-05-06 DIAGNOSIS — Z20822 Contact with and (suspected) exposure to covid-19: Secondary | ICD-10-CM | POA: Diagnosis not present

## 2023-05-06 DIAGNOSIS — R5381 Other malaise: Secondary | ICD-10-CM | POA: Diagnosis present

## 2023-05-06 DIAGNOSIS — I2510 Atherosclerotic heart disease of native coronary artery without angina pectoris: Secondary | ICD-10-CM | POA: Insufficient documentation

## 2023-05-06 DIAGNOSIS — I4891 Unspecified atrial fibrillation: Secondary | ICD-10-CM | POA: Insufficient documentation

## 2023-05-06 DIAGNOSIS — R531 Weakness: Secondary | ICD-10-CM | POA: Insufficient documentation

## 2023-05-06 DIAGNOSIS — I480 Paroxysmal atrial fibrillation: Secondary | ICD-10-CM | POA: Diagnosis present

## 2023-05-06 DIAGNOSIS — Z79899 Other long term (current) drug therapy: Secondary | ICD-10-CM | POA: Diagnosis not present

## 2023-05-06 DIAGNOSIS — Z7982 Long term (current) use of aspirin: Secondary | ICD-10-CM | POA: Diagnosis not present

## 2023-05-06 LAB — CBC WITH DIFFERENTIAL/PLATELET
Abs Immature Granulocytes: 0.02 10*3/uL (ref 0.00–0.07)
Basophils Absolute: 0.1 10*3/uL (ref 0.0–0.1)
Basophils Relative: 1 %
Eosinophils Absolute: 0.1 10*3/uL (ref 0.0–0.5)
Eosinophils Relative: 1 %
HCT: 40 % (ref 39.0–52.0)
Hemoglobin: 12.7 g/dL — ABNORMAL LOW (ref 13.0–17.0)
Immature Granulocytes: 0 %
Lymphocytes Relative: 10 %
Lymphs Abs: 1 10*3/uL (ref 0.7–4.0)
MCH: 29.5 pg (ref 26.0–34.0)
MCHC: 31.8 g/dL (ref 30.0–36.0)
MCV: 93 fL (ref 80.0–100.0)
Monocytes Absolute: 1.1 10*3/uL — ABNORMAL HIGH (ref 0.1–1.0)
Monocytes Relative: 12 %
Neutro Abs: 7.2 10*3/uL (ref 1.7–7.7)
Neutrophils Relative %: 76 %
Platelets: 188 10*3/uL (ref 150–400)
RBC: 4.3 MIL/uL (ref 4.22–5.81)
RDW: 13.3 % (ref 11.5–15.5)
WBC: 9.4 10*3/uL (ref 4.0–10.5)
nRBC: 0 % (ref 0.0–0.2)

## 2023-05-06 LAB — SARS CORONAVIRUS 2 BY RT PCR: SARS Coronavirus 2 by RT PCR: NEGATIVE

## 2023-05-06 LAB — BASIC METABOLIC PANEL WITH GFR
Anion gap: 9 (ref 5–15)
BUN: 26 mg/dL — ABNORMAL HIGH (ref 8–23)
CO2: 21 mmol/L — ABNORMAL LOW (ref 22–32)
Calcium: 9 mg/dL (ref 8.9–10.3)
Chloride: 102 mmol/L (ref 98–111)
Creatinine, Ser: 1.49 mg/dL — ABNORMAL HIGH (ref 0.61–1.24)
GFR, Estimated: 47 mL/min — ABNORMAL LOW (ref >60)
Glucose, Bld: 113 mg/dL — ABNORMAL HIGH (ref 70–99)
Potassium: 3.8 mmol/L (ref 3.5–5.1)
Sodium: 132 mmol/L — ABNORMAL LOW (ref 135–145)

## 2023-05-06 LAB — TROPONIN I (HIGH SENSITIVITY)
Troponin I (High Sensitivity): 34 ng/L — ABNORMAL HIGH
Troponin I (High Sensitivity): 12 ng/L

## 2023-05-06 MED ORDER — SODIUM CHLORIDE 0.9 % IV BOLUS
1000.0000 mL | Freq: Once | INTRAVENOUS | Status: AC
  Administered 2023-05-06: 1000 mL via INTRAVENOUS

## 2023-05-06 MED ORDER — METOPROLOL TARTRATE 5 MG/5ML IV SOLN
5.0000 mg | Freq: Once | INTRAVENOUS | Status: DC
  Filled 2023-05-06: qty 5

## 2023-05-06 NOTE — Telephone Encounter (Signed)
Discussed with Edd Fabian, FNP-C have pt go directly to the ER to be DCCV'd. Called and spoke with pt, he states that his heart is racing, low energy and very lethargic and tired. Informed pt to do directly to the ER at [facility]. Verbalized understanding. He states that he will go to [facility] ER ASAP

## 2023-05-06 NOTE — ED Provider Triage Note (Signed)
Emergency Medicine Provider Triage Evaluation Note  Edward [name] , a 82 y.o. male  was evaluated in triage.  Pt complains of fatigue, shortness of breath.  This been ongoing for a few days.  Presented to his PCP who found that he was in A-fib with RVR and sent him to the ED.  He states he has been in A-fib before but not recently.  He takes metoprolol and amlodipine but recently cut back on the amlodipine.  He is not anticoagulated.  No chest pain.  Review of Systems  Positive: As above Negative: As above  Physical Exam  BP 92/66   Pulse 82   Temp 97.6 F (36.4 C)   Resp 20   Ht 5\' 7"  (1.702 m)   Wt 67.1 kg   SpO2 100%   BMI 23.18 kg/m  Gen:   Awake, no distress   Resp:  Normal effort  MSK:   Moves extremities without difficulty  Other:  Heart rate tachycardic and irregular  Medical Decision Making  Medically screening exam initiated at 12:48 PM.  Appropriate orders placed.  Edward [name] was informed that the remainder of the evaluation will be completed by another provider, this initial triage assessment does not replace that evaluation, and the importance of remaining in the ED until their evaluation is complete.  Workup initiated.  Patient will be next back   Michelle Piper, [place] 05/06/23 1249

## 2023-05-06 NOTE — Discharge Instructions (Addendum)
We evaluated you for your weakness and atrial fibrillation.  You returned back to a normal rhythm while in the emergency department.  You were seen by cardiology and they feel that it is safe to go home.  We have placed a referral for cardiology follow-up.  Please return to the emergency department if you have any new or worsening symptoms such as chest pain, lightheadedness or dizziness, sweating, fevers or chills, abdominal pain, or any other new symptoms.

## 2023-05-06 NOTE — ED Triage Notes (Signed)
Patient arrives ambulatory by POV states starting on Sunday feeling very fatigued and brain fog. States been has been in bed since Sunday night with no energy but unable to sleep. States he is having a hard time describing his symptoms. Reports pain "all over from either old age or from my leaky heart." Decreased appetite.

## 2023-05-06 NOTE — ED Provider Notes (Addendum)
[name] [facility] AT [facility] Provider Note   CSN: [id number] Arrival date & time: 05/06/23  1209     History  Chief Complaint  Patient presents with   Weakness    Edward [name] is a 82 y.o. male with a history of paroxysmal A-fib, watchman's procedure, presenting to the ED with complaint of generalized weakness, malaise.  Patient reports he has felt this way for approximately 4 days.  He says he is feels very similar to when he had paroxysmal A-fib several months ago.  He takes 81 mg aspirin, no anticoagulation.  He says he is not typically in A-fib.  He went to his PCPs office who took an EKG and said that he was in RVR and needed to come to the hospital.  He reports he feels "rundown".  He denies headache, chest pain, fevers, chills, productive cough.  He is here with his wife at the bedside.  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  Placement of watchman's procedure in May with electrophysiology.  Cardiology team did not feel that small PFO indicated closure.  There is concern that echo showed moderate to severe AI and reduced left ventricular function at the time.  His cardiologist noted the patient is "not a good candidate for AVR at this point".  Of note, patient reports he had a negative COVID and flu test performed earlier today and yesterday.  HPI     Home Medications Prior to Admission medications   Medication Sig Start Date End Date Taking? Authorizing Provider  acetaminophen  (TYLENOL) 500 MG tablet Take 500 mg by mouth every 6 (six) hours as needed for moderate pain or mild pain.    [provider]  amLODipine (NORVASC) 5 MG tablet Take 1 tablet (5 mg total) by mouth daily. 03/31/23   Swaziland, Peter M, MD  aspirin EC 81 MG tablet Take 81 mg by mouth daily. Swallow whole.    [provider]  budesonide-formoterol (SYMBICORT) 160-4.5 MCG/ACT inhaler Inhale 1 puff into the lungs as needed (chest tightness).    [provider]  Cholecalciferol (VITAMIN D3 PO) Take 1 tablet by mouth every other day.    [provider]  Coenzyme Q10 (CO Q 10 PO) Take 300 mg by mouth daily at 12 noon.    [provider]  Cyanocobalamin (VITAMIN B-12 PO) Take 1 tablet by mouth every other day.    [provider]  losartan (COZAAR) 100 MG tablet Take 1 tablet (100 mg total) by mouth daily. 03/31/23   Swaziland, Peter M, MD  MAGNESIUM CITRATE PO Take 250 mg by mouth every other day.    [provider]  Multiple Vitamin (MULTIVITAMIN WITH MINERALS) TABS tablet Take 1 tablet by mouth every other day.    [provider]  rosuvastatin (CRESTOR) 20 MG tablet TAKE 1 TABLET BY MOUTH ONCE DAILY IN THE EVENING 04/10/23   Swaziland, Peter M, MD  tamsulosin (FLOMAX) 0.4 MG CAPS capsule Take 0.4 mg by mouth at bedtime. 08/14/20   [provider]  VITAMIN E PO Take 1  tablet by mouth every other day.    [provider]      Allergies    Augmentin [amoxicillin-pot clavulanate], Codeine, Levaquin [levofloxacin], and Zocor [simvastatin]    Review of Systems   Review of Systems  Physical Exam Updated Vital Signs BP 116/76   Pulse 93   Temp 97.6 F (36.4 C)   Resp (!) 28   Ht 5\' 7"  (1.702 m)   Wt 67.1 kg   SpO2 97%   BMI 23.18 kg/m  Physical Exam Constitutional:      General: He is not in acute distress. HENT:     Head: Normocephalic and atraumatic.  Eyes:     Conjunctiva/sclera: Conjunctivae normal.     Pupils:  Pupils are equal, round, and reactive to light.  Cardiovascular:     Rate and Rhythm: Tachycardia present. Rhythm irregular.     Heart sounds: Murmur heard.  Pulmonary:     Effort: Pulmonary effort is normal. No respiratory distress.  Abdominal:     General: There is no distension.     Tenderness: There is no abdominal tenderness.  Skin:    General: Skin is warm and dry.  Neurological:     General: No focal deficit present.     Mental Status: He is alert. Mental status is at baseline.  Psychiatric:        Mood and Affect: Mood normal.        Behavior: Behavior normal.     ED Results / Procedures / Treatments   Labs (all labs ordered are listed, but only abnormal results are displayed) Labs Reviewed  BASIC METABOLIC PANEL - Abnormal; Notable for the following components:      Result Value   Sodium 132 (*)    CO2 21 (*)    Glucose, Bld 113 (*)    BUN 26 (*)    Creatinine, Ser 1.49 (*)    GFR, Estimated 47 (*)    All other components within normal limits  CBC WITH DIFFERENTIAL/PLATELET - Abnormal; Notable for the following components:   Hemoglobin 12.7 (*)    Monocytes Absolute 1.1 (*)    All other components within normal limits  SARS CORONAVIRUS 2 BY RT PCR  TROPONIN I (HIGH SENSITIVITY)    EKG EKG Interpretation Date/Time:  Wednesday May 06 2023 14:24:13 EDT Ventricular Rate:  117 PR Interval:    QRS Duration:  98 QT Interval:  327 QTC Calculation: 457 R Axis:   72  Text Interpretation: Atrial fibrillation Consider anterior infarct Borderline T abnormalities, inferior leads Confirmed by Alvester Chou [phone]) on 05/06/2023 2:49:24 PM  Radiology DG Chest 2 View  Result Date: 05/06/2023 CLINICAL DATA:  Shortness of breath.  Weakness. EXAM: CHEST - 2 VIEW COMPARISON:  01/09/2022. FINDINGS: Bilateral lung fields are clear. Note is made of elevated right hemidiaphragm. Bilateral costophrenic angles are clear. Normal cardio-mediastinal silhouette. No acute  osseous abnormalities. The soft tissues are within normal limits. IMPRESSION: 1. No active cardiopulmonary disease. 2. Elevated right hemidiaphragm. Electronically Signed   By: Jules Schick M.D.   On: 05/06/2023 14:09    Procedures Procedures    Medications Ordered in ED Medications  sodium chloride 0.9 % bolus 1,000 mL (has no administration in time range)    ED Course/ Medical Decision Making/ A&P Clinical Course as of 05/06/23 1524  Wed May 06, 2023  1443 Consulted cardiology, spoke to Trish [MT]  1516 Received sign out from Dr. Renaye Rakers pending cardiology consult. Presenting with afib. Has watchman  procedure. He is not anticoagulated and feels bad [WS]  1523 HR 70's in A fib, metoprolol canceled now.  Awaiting cardiology consult [MT]    Clinical Course User Index [MT] Krystale Rinkenberger, Kermit Balo, MD [WS] Lonell Grandchild, MD                                 Medical Decision Making Amount and/or Complexity of Data Reviewed ECG/medicine tests: ordered.  Risk Prescription drug management.   This patient presents to the ED with concern for fatigue, weakness, A-fib. This involves an extensive number of treatment options, and is a complaint that carries with it a high risk of complications and morbidity.  The differential diagnosis includes A Fib with RVR versus anemia versus dehydration versus viral illness versus other  Co-morbidities that complicate the patient evaluation: History of paroxysmal A-fib and cardiac disease at high risk of cardiac complication   External records from outside source obtained and reviewed including cardiac office records as noted above  I ordered and personally interpreted labs.  The pertinent results include: Mild hyponatremia, creatinine 1.49  I ordered imaging studies including x-ray of the chest I independently visualized and interpreted imaging which showed no emergent findings, elevated right hemidiaphragm I agree with the radiologist  interpretation  The patient was maintained on a cardiac monitor.  I personally viewed and interpreted the cardiac monitored which showed an underlying rhythm of: A-fib with heart rate relatively well-controlled, 90-110 bpm  Per my interpretation the patient's ECG shows A-fib with RVR  I ordered medication including IV metoprolol for A-fib with RVR  I have reviewed the patients home medicines and have made adjustments as needed  Test Considered: Low suspicion for acute PE, CVA  I requested consultation with the cardiology,  and discussed lab and imaging findings as well as pertinent plan - they recommend: Consult pending at the time of signout  After the interventions noted above, I reevaluated the patient and found that they have: stayed the same  Dispostion:  Patient is signed out to Dr Alvino Blood EDP pending cardiology consult, follow up on labs and heart rate reassessment.         Final Clinical Impression(s) / ED Diagnoses Final diagnoses:  Weakness  Atrial fibrillation with RVR [facility])    Rx / DC Orders ED Discharge Orders     None         Jerian Morais, Kermit Balo, MD 05/06/23 1502    Terald Sleeper, MD 05/06/23 1524

## 2023-05-06 NOTE — ED Provider Notes (Signed)
    ED Course / MDM   Clinical Course as of 05/06/23 1733  Wed May 06, 2023  1443 Consulted cardiology, spoke to Trish [MT]  1516 Received sign out from Dr. Renaye Rakers pending cardiology consult. Presenting with afib. Has watchman procedure. He is not anticoagulated and feels bad [WS]  1523 HR 70's in A fib, metoprolol canceled now.  Awaiting cardiology consult [MT]  1701 Patient back in sinus rhythm.  Initial troponin is elevated, repeat is normal.  Patient was seen by Dr. Antoine Poche with cardiology.  He agrees with discharge.  Patient can follow-up with cardiology as an outpatient.  Patient is feeling much better. Will discharge patient to home. All questions answered. Patient comfortable with plan of discharge. Return precautions discussed with patient and specified on the after visit summary.  [WS]    Clinical Course User Index [MT] Trifan, Kermit Balo, MD [WS] Lonell Grandchild, MD   Medical Decision Making Amount and/or Complexity of Data Reviewed ECG/medicine tests: ordered.         Lonell Grandchild, MD 05/06/23 [id number]

## 2023-05-06 NOTE — Consult Note (Signed)
CONSULTATION NOTE   Patient Name: Edward [name] Date of Encounter: 05/06/2023 Cardiologist: Peter Swaziland, MD Electrophysiologist: None Advanced Heart Failure: None   Chief Complaint   Fatigue and dyspnea  Patient Profile   82 year old male with a history paroxysmal atrial fibrillation coronary artery disease and recurrent TIA who recently underwent left atrial appendage closure in May, presents with progressive fatigue and dyspnea and is noted to be back in atrial fibrillation.  HPI   Edward [name] is a 82 y.o. male who is being seen today for the evaluation of atrial fibrillation at the request of Dr. Suezanne Jacquet.  This is an 82 year old male patient of Dr. Swaziland with a history of coronary artery disease with prior stent to the LAD in 1995, dyslipidemia, recurrent TIA and aortic insufficiency.  He was noted to have a very small PFO after TIA workup in 2019 and closure was not felt to be indicated.  He is also had concomitant atrial fibrillation and was started on Eliquis.  Eventually underwent Watchman device implantation in May 2023 after struggling with severe hematuria.  He was recently seen in the office by Dr. Swaziland in August and although an EKG was not performed exam suggest that he was in a regular rate and rhythm.  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  He was not felt to be a good candidate for AVR.   He now presents with 4 days of generalized weakness and malaise.  He took a COVID test which was negative.  He reports he is generally been in normal sinus rhythm but he went to his primary care provider's office who noted on EKG that he was in A-fib with RVR and recommended he present to the hospital.  His most recent echo showed LVEF of 40 to 45% with global hypokinesis and moderate to severe AI in May 2024.  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 Lalla Brothers   Spermatocele    left   Stroke [facility])    TIA's   Urgency of urination    Vertigo     Past Surgical History:  Procedure Laterality Date   BALLOON DILATION N/A 07/22/2022   Procedure: Varney Biles BALLOON DILATION;  Surgeon: Jerilee Field, MD;  Location: [facility];  Service: Urology;  Laterality: N/A;  60 MINS   CORONARY ANGIOPLASTY  1995  DR Swaziland   LAD   CYSTOSCOPY N/A 09/18/2015   Procedure: CYSTOSCOPY;  Surgeon: Jerilee Field, MD;  Location: [facility];  Service: Urology;  Laterality: N/A;   CYSTOSCOPY WITH RETROGRADE PYELOGRAM, URETEROSCOPY AND STENT PLACEMENT Left 03/01/2013   Procedure: CYSTOSCOPY WITH LEFT RETROGRADE PYELOGRAM, LEFT URETEROSCOPY and stent PLACEMENT;  Surgeon: Antony Haste, MD;  Location: [facility];  Service: Urology;  Laterality: Left;   EYE SURGERY Left    cataract surgery   FOOT FUSION Left    SECONDARY TO FX'S   GREEN LIGHT LASER TURP (TRANSURETHRAL RESECTION OF PROSTATE N/A 10/04/2013   Procedure: GREEN LIGHT LASER TURP (TRANSURETHRAL RESECTION OF PROSTATE;  Surgeon: Antony Haste, MD;  Location: [facility];  Service: Urology;  Laterality: N/A;   INGUINAL HERNIA REPAIR Bilateral    LEFT ATRIAL APPENDAGE OCCLUSION N/A 01/09/2022   Procedure: LEFT ATRIAL APPENDAGE OCCLUSION;  Surgeon: Lanier Prude, MD;  Location: [facility];  Service: Cardiovascular;  Laterality: N/A;   LOOP RECORDER INSERTION N/A 04/20/2018   Procedure: LOOP  RECORDER INSERTION;  Surgeon: Marinus Maw, MD;  Location: [facility];  Service: Cardiovascular;  Laterality: N/A;   SHOULDER ARTHROSCOPY Right 08/18/2002   SHOULDER ARTHROSCOPY WITH DEBRIDEMENT AND BICEP TENDON REPAIR Left 04/28/2011   SPERMATOCELECTOMY Left 10/04/2013   Procedure: LEFT SPERMATOCELECTOMY;  Surgeon: Antony Haste, MD;  Location: [facility];  Service: Urology;  Laterality: Left;   TEE WITHOUT CARDIOVERSION N/A 02/16/2018   Procedure: TRANSESOPHAGEAL ECHOCARDIOGRAM (TEE);  Surgeon: Chrystie Nose, MD;  Location: [facility];  Service: Cardiovascular;  Laterality: N/A;   TEE WITHOUT CARDIOVERSION N/A 01/09/2022   Procedure: TRANSESOPHAGEAL ECHOCARDIOGRAM (TEE);  Surgeon: Lanier Prude, MD;  Location: [facility];  Service: Cardiovascular;  Laterality: N/A;   TRANSURETHRAL RESECTION OF PROSTATE N/A 12/11/2014   Procedure: CYSTOSCOPY, CLOT EVACUATION, WITH FULGURATION ;  Surgeon: Bjorn Pippin, MD;  Location: [facility];  Service: Urology;  Laterality: N/A;   TRANSURETHRAL RESECTION OF PROSTATE N/A 09/18/2015   Procedure: TRANSURETHRAL RESECTION OF THE PROSTATE (TURP);  Surgeon: Jerilee Field, MD;  Location: [facility];  Service: Urology;  Laterality: N/A;   UMBILICAL HERNIA REPAIR  06/02/2008   UMBILICAL HERNIA REPAIR N/A 11/12/2021   Procedure: UMBILICAL HERNIA REPAIR;  Surgeon: Abigail Miyamoto, MD;  Location: [facility] OR;  Service: General;  Laterality: N/A;  LMA    FAMHx   History reviewed. No pertinent family history.  SOCHx    reports that he quit smoking about 59 years ago. His smoking use included cigarettes. He started smoking about 62 years ago. He has a 1.5 pack-year smoking history. He has never used smokeless tobacco. He reports current alcohol use. He reports that he does not use drugs.  Outpatient Medications   No current facility-administered medications on file prior to encounter.   Current Outpatient Medications on File Prior to  Encounter  Medication Sig Dispense Refill   acetaminophen (TYLENOL) 500 MG tablet Take 500 mg by mouth every 6 (six) hours as needed for moderate pain or mild pain.     amLODipine (NORVASC) 5 MG tablet Take 1 tablet (5 mg total) by mouth daily. 90 tablet 3   aspirin EC 81 MG tablet Take 81 mg by mouth daily. Swallow whole.     budesonide-formoterol (SYMBICORT) 160-4.5 MCG/ACT inhaler Inhale 1 puff into the lungs as needed (chest tightness).     Cholecalciferol (VITAMIN D3 PO) Take 1 tablet by mouth every other day.     Coenzyme Q10 (CO Q 10 PO) Take 300 mg by mouth daily at 12 noon.     Cyanocobalamin (VITAMIN B-12 PO) Take 1 tablet by mouth every other day.     losartan (COZAAR) 100 MG tablet Take 1 tablet (100 mg total) by mouth daily. 90 tablet 3   MAGNESIUM CITRATE PO Take 250 mg by mouth  every other day.     Multiple Vitamin (MULTIVITAMIN WITH MINERALS) TABS tablet Take 1 tablet by mouth every other day.     rosuvastatin (CRESTOR) 20 MG tablet TAKE 1 TABLET BY MOUTH ONCE DAILY IN THE EVENING 90 tablet 3   tamsulosin (FLOMAX) 0.4 MG CAPS capsule Take 0.4 mg by mouth at bedtime.     VITAMIN E PO Take 1 tablet by mouth every other day.      Inpatient Medications    Scheduled Meds:   Continuous Infusions:   PRN Meds:    ALLERGIES   Allergies  Allergen Reactions   Augmentin [Amoxicillin-Pot Clavulanate] Diarrhea   Codeine Nausea Only   Levaquin [Levofloxacin]     Other reaction(s): severe stomach pain   Zocor [Simvastatin]     Other reaction(s): myalgia    ROS   Pertinent items noted in HPI and remainder of comprehensive ROS otherwise negative.  Vitals   Vitals:   05/06/23 1500 05/06/23 1615 05/06/23 1617 05/06/23 1700  BP: 116/76 (!) 127/54  (!) 158/67  Pulse: 93 65  77  Resp: (!) 28 (!) 25  19  Temp:   97.9 F (36.6 C)   SpO2: 97% 100%  100%  Weight:      Height:       No intake or output data in the 24 hours ending 05/06/23 1716 Filed Weights   05/06/23  1230  Weight: 67.1 kg    Physical Exam   General appearance: alert and no distress Neck: no carotid bruit, no JVD, and thyroid not enlarged, symmetric, no tenderness/mass/nodules Lungs: clear to auscultation bilaterally Heart: regular rate and rhythm, S1, S2 normal, no murmur, click, rub or gallop Abdomen: soft, non-tender; bowel sounds normal; no masses,  no organomegaly Extremities: extremities normal, atraumatic, no cyanosis or edema Pulses: 2+ and symmetric Skin: Skin color, texture, turgor normal. No rashes or lesions Neurologic: Grossly normal Psych: Pleasant  Labs   Results for orders placed or performed during the hospital encounter of 05/06/23 (from the past 48 hour(s))  Basic metabolic panel     Status: Abnormal   Collection Time: 05/06/23 12:48 PM  Result Value Ref Range   Sodium 132 (L) 135 - 145 mmol/L   Potassium 3.8 3.5 - 5.1 mmol/L   Chloride 102 98 - 111 mmol/L   CO2 21 (L) 22 - 32 mmol/L   Glucose, Bld 113 (H) 70 - 99 mg/dL    Comment: Glucose reference range applies only to samples taken after fasting for at least 8 hours.   BUN 26 (H) 8 - 23 mg/dL   Creatinine, Ser 4.09 (H) 0.61 - 1.24 mg/dL   Calcium 9.0 8.9 - 81.1 mg/dL   GFR, Estimated 47 (L) >60 mL/min    Comment: (NOTE) Calculated using the CKD-EPI Creatinine Equation (2021)    Anion gap 9 5 - 15    Comment: Performed at [facility], [address] [place], [place] [address]  CBC with Differential     Status: Abnormal   Collection Time: 05/06/23 12:48 PM  Result Value Ref Range   WBC 9.4 4.0 - 10.5 K/uL   RBC 4.30 4.22 - 5.81 MIL/uL   Hemoglobin 12.7 (L) 13.0 - 17.0 g/dL   HCT 29.5 62.1 - 30.8 %   MCV 93.0 80.0 - 100.0 fL   MCH 29.5 26.0 - 34.0 pg   MCHC 31.8 30.0 - 36.0 g/dL   RDW 65.7 84.6 - 96.2 %   Platelets 188 150 -  400 K/uL   nRBC 0.0 0.0 - 0.2 %   Neutrophils Relative % 76 %   Neutro Abs 7.2 1.7 - 7.7 K/uL   Lymphocytes Relative 10 %   Lymphs Abs 1.0 0.7 - 4.0 K/uL    Monocytes Relative 12 %   Monocytes Absolute 1.1 (H) 0.1 - 1.0 K/uL   Eosinophils Relative 1 %   Eosinophils Absolute 0.1 0.0 - 0.5 K/uL   Basophils Relative 1 %   Basophils Absolute 0.1 0.0 - 0.1 K/uL   Immature Granulocytes 0 %   Abs Immature Granulocytes 0.02 0.00 - 0.07 K/uL    Comment: Performed at [facility], [address], [place], [place] [address]  Troponin I (High Sensitivity)     Status: Abnormal   Collection Time: 05/06/23  2:21 PM  Result Value Ref Range   Troponin I (High Sensitivity) 34 (H) <18 ng/L    Comment: (NOTE) Elevated high sensitivity troponin I (hsTnI) values and significant  changes across serial measurements may suggest ACS but many other  chronic and acute conditions are known to elevate hsTnI results.  Refer to the "Links" section for chest pain algorithms and additional  guidance. Performed at [facility], [address], [place], [place] [address]   SARS Coronavirus 2 by RT PCR (hospital order, performed in [facility] hospital lab) *cepheid single result test* Anterior Nasal Swab     Status: None   Collection Time: 05/06/23  2:32 PM   Specimen: Anterior Nasal Swab  Result Value Ref Range   SARS Coronavirus 2 by RT PCR NEGATIVE NEGATIVE    Comment: Performed at [facility], [address], [place], [place] [address]  Troponin I (High Sensitivity)     Status: None   Collection Time: 05/06/23  4:13 PM  Result Value Ref Range   Troponin I (High Sensitivity) 12 <18 ng/L    Comment: (NOTE) Elevated high sensitivity troponin I (hsTnI) values and significant  changes across serial measurements may suggest ACS but many other  chronic and acute conditions are known to elevate hsTnI results.  Refer to the "Links" section for chest pain algorithms and additional  guidance. Performed at [facility] [place], [address], [place], [place] [address]     ECG   AFib with RVR at 117 - Personally Reviewed  Telemetry   Sinus rhythm  (presently) - Personally Reviewed  Radiology   DG Chest 2 View  Result Date: 05/06/2023 CLINICAL DATA:  Shortness of breath.  Weakness. EXAM: CHEST - 2 VIEW COMPARISON:  01/09/2022. FINDINGS: Bilateral lung fields are clear. Note is made of elevated right hemidiaphragm. Bilateral costophrenic angles are clear. Normal cardio-mediastinal silhouette. No acute osseous abnormalities. The soft tissues are within normal limits. IMPRESSION: 1. No active cardiopulmonary disease. 2. Elevated right hemidiaphragm. Electronically Signed   By: Jules Schick M.D.   On: 05/06/2023 14:09    Cardiac Studies   N/A  Impression   Active Problems:   PAF (paroxysmal atrial fibrillation) (HCC)   Recommendation   Mr. Lemieux presented with paroxysmal atrial fibrillation but fortunately converted to sinus rhythm spontaneously in the ER. He feels much better at this point. He was seen by his PCP today and filled an Rx for metoprolol - I advised him to start taking this. He should follow-up with Dr. Swaziland to discuss possible anti-arrythmic therapy. Fortunately, he is s/p Watchman LAA occluder and does not require anticoagulation for stroke prophylaxis. Ok to d/c home  from the ER.  Time Spent Directly with Patient:  I have spent a total of 35 minutes with the patient reviewing hospital notes, telemetry, EKGs, labs and examining the patient as well as establishing an assessment and plan that was discussed personally with the patient.  > 50% of time was spent in direct patient care.  Length of Stay:  LOS: 0 days   Chrystie Nose, MD, [facility], FACP  [place]  [facility]  Medical Director of the Advanced Lipid Disorders &  Cardiovascular Risk Reduction Clinic Diplomate of the American Board of Clinical Lipidology Attending Cardiologist  Direct Dial: [phone]  Fax: [phone]  Website:  [email or web address] 05/06/2023, 5:16 PM

## 2023-05-17 ENCOUNTER — Other Ambulatory Visit: Payer: Self-pay

## 2023-05-17 ENCOUNTER — Emergency Department (HOSPITAL_COMMUNITY): Payer: Medicare Other

## 2023-05-17 ENCOUNTER — Encounter (HOSPITAL_COMMUNITY): Payer: Self-pay

## 2023-05-17 ENCOUNTER — Emergency Department (HOSPITAL_COMMUNITY)
Admission: EM | Admit: 2023-05-17 | Discharge: 2023-05-18 | Discharge status: Against Medical Advice | Payer: Medicare Other | Attending: Emergency Medicine | Admitting: Emergency Medicine

## 2023-05-17 DIAGNOSIS — I2510 Atherosclerotic heart disease of native coronary artery without angina pectoris: Secondary | ICD-10-CM | POA: Diagnosis not present

## 2023-05-17 DIAGNOSIS — I351 Nonrheumatic aortic (valve) insufficiency: Secondary | ICD-10-CM | POA: Diagnosis not present

## 2023-05-17 DIAGNOSIS — Z5321 Procedure and treatment not carried out due to patient leaving prior to being seen by health care provider: Secondary | ICD-10-CM | POA: Diagnosis not present

## 2023-05-17 DIAGNOSIS — I480 Paroxysmal atrial fibrillation: Secondary | ICD-10-CM | POA: Diagnosis not present

## 2023-05-17 DIAGNOSIS — I10 Essential (primary) hypertension: Secondary | ICD-10-CM | POA: Insufficient documentation

## 2023-05-17 DIAGNOSIS — D649 Anemia, unspecified: Secondary | ICD-10-CM | POA: Diagnosis not present

## 2023-05-17 DIAGNOSIS — R000 Tachycardia, unspecified: Secondary | ICD-10-CM | POA: Insufficient documentation

## 2023-05-17 DIAGNOSIS — Z95818 Presence of other cardiac implants and grafts: Secondary | ICD-10-CM | POA: Diagnosis not present

## 2023-05-17 LAB — BASIC METABOLIC PANEL
Anion gap: 8 (ref 5–15)
BUN: 19 mg/dL (ref 8–23)
CO2: 25 mmol/L (ref 22–32)
Calcium: 9.2 mg/dL (ref 8.9–10.3)
Chloride: 105 mmol/L (ref 98–111)
Creatinine, Ser: 1.08 mg/dL (ref 0.61–1.24)
GFR, Estimated: >60 mL/min (ref >60)
Glucose, Bld: 98 mg/dL (ref 70–99)
Potassium: 4.2 mmol/L (ref 3.5–5.1)
Sodium: 138 mmol/L (ref 135–145)

## 2023-05-17 LAB — CBC
HCT: 34.1 % — ABNORMAL LOW (ref 39.0–52.0)
Hemoglobin: 11 g/dL — ABNORMAL LOW (ref 13.0–17.0)
MCH: 30.1 pg (ref 26.0–34.0)
MCHC: 32.3 g/dL (ref 30.0–36.0)
MCV: 93.2 fL (ref 80.0–100.0)
Platelets: 249 10*3/uL (ref 150–400)
RBC: 3.66 MIL/uL — ABNORMAL LOW (ref 4.22–5.81)
RDW: 12.9 % (ref 11.5–15.5)
WBC: 7.4 10*3/uL (ref 4.0–10.5)
nRBC: 0 % (ref 0.0–0.2)

## 2023-05-17 NOTE — ED Triage Notes (Addendum)
Pt came in via POV d/t his heart rate being elevated after mowing the yard about 1 hr before arrival, states his hearty rate was 145 bpm. Does have Hx of A-Fib, denies feeling dizzy, no chest flutters/palpitations. A/Ox4, denies pain. Pulse 106 bpm during triage.

## 2023-05-17 NOTE — ED Notes (Signed)
Pt left AMA. States that as per his pulse oximeter, his heart rate has gone down and he feels like he is good to go. He also mentioned that he has an appointment with the cardiologist tomorrow morning.

## 2023-05-17 NOTE — Progress Notes (Unsigned)
Cardiology Office Note    Date:  05/18/2023  ID:  Edward, [name] 01-24-1941, MRN [id number] PCP:  [name], MD  Cardiologist:  Peter Swaziland, MD  Electrophysiologist:  None   Chief Complaint: Hospital follow up for Afib   History of Present Illness: Edward [name]    Edward [name] is a 82 y.o. 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  In 12/2017 patient had a TIA, workup revealed small PFO.  Seen by Dr. Excell Seltzer in neurology, PFO closure not felt to be indicated.  Loop recorder placed which noted a single episode of atrial fibrillation.  Eliquis was initiated.  He eventually underwent Watchman device with Dr. Lalla Brothers in May 2023.  Beta-blocker (atenolol) previously discontinued due to bradycardia and dizziness.  In 12/2022 he was evaluated by Gillian Shields, NP for atypical chest pain and palpitations following an acute viral illness.  COVID test was negative, stated he woke up with flulike symptoms.  Went to the ED but left after not being seen for 4 hours.  Follow-up echo on 01/02/2023 showed LVEF of 40 to 45%, LV with mildly decreased function, global hypokinesis LV internal cavity size was mildly dilated, G2 DD, left atrial size was severely dilated no obvious residual ASD from Watchman procedure noted on TTE, right atrial size was moderately dilated mild MR, there were several jets of AI overall moderate to severe, aortic regurgitation PHT measures .   Edward [name] presented to the ED on 05/06/23 with 4 days of generalized weakness and malaise.  He took a COVID test which was negative.  He gone to his PCPs office who noted on EKG that he was in atrial fibrillation with RVR and recommended he present to the hospital.  In the ED he spontaneously converted to sinus rhythm and was feeling better.  Evaluated by Dr. Rennis Golden in the ED and he was encouraged to start metoprolol that  was filled by his PCP.  His troponin was initially elevated at 34, decreased to 12 prior to discharge.   On 05/17/23 he presented to the ED for elevated heart rate after mowing the lawn for about 1 hour, he noted increasing fatigue. He checked his pulse ox monitor and his heart rate was increased into the 120s.  EKG in the ED showed atrial fibrillation at a rate of 122 bpm. Chest xray was unremarkable, labs were reassuring aside from hemoglobin of 11. His heart rate normalized and he left AMA prior to further evaluation.   Today he presents for hospital follow-up.  He reports that he is feeling well today.  Discussing his atrial fibrillation he reports that he is overall unaware of being in atrial fibrillation aside from some increased fatigue.  He denies chest pain, shortness of breath, palpitations or lower extremity edema.  Reports that he has not been taking the metoprolol succinate that his PCP gave him as he has a history of bradycardia and is concerned it would cause increased dizziness.   Labwork independently reviewed: 05/06/2023: Sodium 132, potassium 3.8, creatinine 1.49, hemoglobin 12.7, hematocrit 40 05/17/23: Sodium 138, potassium 4.2, creatinine 1.08, hemoglobin 11, hematocrit 34.1 ROS: .   Today he denies chest pain, shortness of breath, lower extremity edema, palpitations, melena, hematuria, hemoptysis, diaphoresis, weakness, presyncope, syncope, orthopnea, and PND.  All other systems are reviewed and otherwise negative. Studies Reviewed: Edward [name]    EKG:  EKG is ordered today, personally reviewed, demonstrating  EKG Interpretation Date/Time:  Monday May 18 2023 13:59:00 EDT Ventricular Rate:  55 PR Interval:  180 QRS Duration:  98 QT Interval:  462 QTC Calculation: 441 R Axis:   63  Text Interpretation: Sinus bradycardia Nonspecific T wave abnormality When compared with ECG of 17-May-2023 16:25, Sinus rhythm has replaced Atrial fibrillation Confirmed by Reather Littler [phone]) on  05/18/2023 2:04:11 PM    CV Studies:  Cardiac Studies & Procedures     STRESS TESTS  EXERCISE TOLERANCE TEST (ETT) 11/22/2015   ECHOCARDIOGRAM  ECHOCARDIOGRAM COMPLETE 01/02/2023  Narrative ECHOCARDIOGRAM REPORT    Patient Name:   Edward [name] Date of Exam: 01/02/2023 Medical Rec #:  [id number]       Height:       66.0 in Accession #:    [id number]      Weight:       147.0 lb Date of Birth:  06-30-1941       BSA:          1.755 m Patient Age:    82 years        BP:           122/74 mmHg Patient Gender: M               HR:           68 bpm. Exam Location:  [address]  Procedure: 2D Echo, Cardiac Doppler and Color Doppler  Indications:    I35.9 Aortic valve Disorder  History:        Patient has prior history of Echocardiogram examinations, most recent 12/16/2021. CAD, LAA Closure Device-Watchman FLX 27 mm., Stroke; Risk Factors:Hypertension and Dyslipidemia.  Sonographer:    Sedonia Small Rodgers-Jones RDCS Referring Phys: [id number] PETER M Swaziland  IMPRESSIONS   1. Left ventricular ejection fraction, by estimation, is 40 to 45%. The left ventricle has mildly decreased function. The left ventricle demonstrates global hypokinesis. The left ventricular internal cavity size was mildly dilated. Left ventricular diastolic parameters are consistent with Grade II diastolic dysfunction (pseudonormalization). 2. Right ventricular systolic function is normal. The right ventricular size is normal. 3. Left atrial size was severely dilated. 4. No obvious residual ASD from Watchman procedure noted on TTE. 5. Right atrial size was moderately dilated. 6. The mitral valve is normal in structure. Mild mitral valve regurgitation. No evidence of mitral stenosis. 7. There are several jets of AI overall moderate to severe. . The aortic valve is tricuspid. 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 No aortic stenosis is present. Aortic regurgitation PHT measures 376 msec. 8. Aortic dilatation noted.  There is borderline dilatation of the aortic root, measuring 38 mm. 9. The inferior vena cava is normal in size with greater than 50% respiratory variability, suggesting right atrial pressure of 3 mmHg.  FINDINGS Left Ventricle: Left ventricular ejection fraction, by estimation, is 40 to 45%. The left ventricle has mildly decreased function. The left ventricle demonstrates global hypokinesis. The left ventricular internal cavity size was mildly dilated. There is no left ventricular hypertrophy. Left ventricular diastolic parameters are consistent with Grade II diastolic dysfunction (pseudonormalization).  Right Ventricle: The right ventricular size is normal. No increase in right ventricular wall thickness. Right ventricular systolic function is normal.  Left Atrium: Left atrial size was severely dilated.  Right Atrium: Right atrial size was moderately dilated.  Pericardium: There is no evidence of pericardial effusion.  Mitral Valve: The mitral valve is normal in structure. Mild mitral valve regurgitation. No evidence of mitral valve  stenosis.  Tricuspid Valve: The tricuspid valve is normal in structure. Tricuspid valve regurgitation is trivial. No evidence of tricuspid stenosis.  Aortic Valve: There are several jets of AI overall moderate to severe. The aortic valve is tricuspid. 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 Aortic regurgitation PHT measures 376 msec. No aortic stenosis is present. Aortic valve mean gradient measures 6.0 mmHg. Aortic valve peak gradient measures 10.9 mmHg. Aortic valve area, by VTI measures 2.30 cm.  Pulmonic Valve: The pulmonic valve was normal in structure. Pulmonic valve regurgitation is not visualized. No evidence of pulmonic stenosis.  Aorta: Aortic dilatation noted. There is borderline dilatation of the aortic root, measuring 38 mm.  Venous: The inferior vena cava is normal in size with greater than 50% respiratory variability, suggesting right  atrial pressure of 3 mmHg.  IAS/Shunts: No atrial level shunt detected by color flow Doppler.   LEFT VENTRICLE PLAX 2D LVIDd:         5.70 cm   Diastology LVIDs:         4.60 cm   LV e' medial:    6.76 cm/s LV PW:         0.90 cm   LV E/e' medial:  15.7 LV IVS:        0.80 cm   LV e' lateral:   8.02 cm/s LVOT diam:     2.40 cm   LV E/e' lateral: 13.2 LV SV:         85 LV SV Index:   48 LVOT Area:     4.52 cm   RIGHT VENTRICLE             IVC RV Basal diam:  4.60 cm     IVC diam: 1.60 cm RV S prime:     16.25 cm/s TAPSE (M-mode): 3.2 cm  LEFT ATRIUM             Index        RIGHT ATRIUM           Index LA diam:        4.10 cm 2.34 cm/m   RA Area:     17.60 cm LA Vol (A2C):   83.1 ml 47.36 ml/m  RA Volume:   56.50 ml  32.20 ml/m LA Vol (A4C):   49.5 ml 28.21 ml/m LA Biplane Vol: 67.1 ml 38.24 ml/m AORTIC VALVE AV Area (Vmax):    2.23 cm AV Area (Vmean):   2.18 cm AV Area (VTI):     2.30 cm AV Vmax:           165.00 cm/s AV Vmean:          112.500 cm/s AV VTI:            0.368 m AV Peak Grad:      10.9 mmHg AV Mean Grad:      6.0 mmHg LVOT Vmax:         81.17 cm/s LVOT Vmean:        54.200 cm/s LVOT VTI:          0.187 m LVOT/AV VTI ratio: 0.51 AI PHT:            376 msec  AORTA Ao Root diam: 3.60 cm Ao Asc diam:  3.80 cm  MITRAL VALVE MV Area (PHT): 4.54 cm     SHUNTS MV Decel Time: 167 msec     Systemic VTI:  0.19 m MV E velocity: 106.25 cm/s  Systemic Diam: 2.40  cm MV A velocity: 75.20 cm/s MV E/A ratio:  1.41  Arvilla Meres MD Electronically signed by Arvilla Meres MD Signature Date/Time: 01/02/2023/5:15:39 PM    Final   TEE  ECHO TEE 01/09/2022  Narrative TRANSESOPHOGEAL ECHO REPORT    Patient Name:   Edward [name] Date of Exam: 01/09/2022 Medical Rec #:  [id number]       Height:       66.0 in Accession #:    [id number]      Weight:       150.0 lb Date of Birth:  1941-06-19       BSA:          1.770 m Patient Age:    81 years         BP:           161/86 mmHg Patient Gender: M               HR:           62 bpm. Exam Location:  Inpatient  Procedure: Transesophageal Echo, Cardiac Doppler, Color Doppler and 3D Echo  Indications:     Afib  History:         Patient has prior history of Echocardiogram examinations, most recent 12/16/2021. CAD; Risk Factors:Hypertension and Dyslipidemia. 27mm Watchman placed on 01/09/22.  Sonographer:     Leta Jungling RDCS Referring Phys:  [id number] Lanier Prude Diagnosing Phys: Riley Lam MD  PROCEDURE: After discussion of the risks and benefits of a TEE, an informed consent was obtained from the patient. The patient was intubated. The transesophogeal probe was passed without difficulty through the esophogus of the patient. Imaged were obtained with the patient in a supine position. Sedation performed by different physician. The patient was monitored while under deep sedation. Anesthestetic sedation was provided intravenously by Anesthesiology: 130mg  of Propofol, 50mg  of Lidocaine. Image quality was good. The patient's vital signs; including heart rate, blood pressure, and oxygen saturation; remained stable throughout the procedure. The patient developed no complications during the procedure.  IMPRESSIONS   1. Interventional TEE for LAA-O Procedure. 2. Prior to procedure, no left atrial appendage thrombus was noted. Maximal diameter 21 mm, mean diameter 18 mm. 3. Mid- Mid transeptal puncture with no issues. 4. Initial deployment of a 27 mm Watchman FLX device with moderate mitral shoulder and slight leak, re-captured and redeployed. 5. Final deployment- 27 mm Watchman device. No peri-device leak. Small mitral shoulder. Mean device diamter 2.24 cm; 16% compressed. 6. Post procedure left to right shunting from transeptal puncture. No significant pericardial effusion. 7. Left ventricular ejection fraction, by estimation, is 50%. The left ventricle has low normal  function. 8. Right ventricular systolic function is normal. The right ventricular size is normal. 9. Aortic dilatation noted. 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 10. The aortic valve is tricuspid. 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 There is near holo-diastolic flow reversal in the ascending aorta. There are multiple jets. Pressure half time of largest jet 286 msec. 11. The mitral valve is normal in structure. Mild mitral valve regurgitation. No evidence of mitral stenosis.  FINDINGS Left Ventricle: Left ventricular ejection fraction, by estimation, is 50%. The left ventricle has low normal function. The left ventricular internal cavity size was normal in size. There is no left ventricular hypertrophy.  Right Ventricle: The right ventricular size is normal. No increase in right ventricular wall thickness. Right ventricular systolic function is normal.  Left Atrium:  Left atrial size was normal in size. No left atrial/left atrial appendage thrombus was detected.  Right Atrium: Right atrial size was normal in size.  Pericardium: Trivial pericardial effusion is present.  Mitral Valve: The mitral valve is normal in structure. Mild mitral valve regurgitation. No evidence of mitral valve stenosis.  Tricuspid Valve: The tricuspid valve is normal in structure. Tricuspid valve regurgitation is trivial. No evidence of tricuspid stenosis.  Aortic Valve: The aortic valve is tricuspid. 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  Pulmonic Valve: The pulmonic valve was grossly normal. Pulmonic valve regurgitation is not visualized. No evidence of pulmonic stenosis.  Aorta: Aortic dilatation noted. There is moderate dilatation of the aortic root, measuring 45 mm.  IAS/Shunts: No atrial level shunt detected by color flow Doppler.   AORTIC VALVE LVOT Vmax:         86.70 cm/s LVOT Vmean:        49.500 cm/s LVOT VTI:          0.188 m AR Vena  Contracta: 0.80 cm  AORTA Ao Root diam: 4.50 cm  MR PISA: 1.57 cm SHUNTS Systemic VTI: 0.19 m  Riley Lam MD Electronically signed by Riley Lam MD Signature Date/Time: 01/09/2022/2:41:28 PM    Final             Current Reported Medications:.    Current Meds  Medication Sig   acetaminophen (TYLENOL) 500 MG tablet Take 500 mg by mouth every 6 (six) hours as needed for moderate pain or mild pain.   aspirin EC 81 MG tablet Take 81 mg by mouth daily. Swallow whole.   budesonide-formoterol (SYMBICORT) 160-4.5 MCG/ACT inhaler Inhale 1 puff into the lungs as needed (chest tightness).   Cholecalciferol (VITAMIN D3 PO) Take 1 tablet by mouth every other day.   Coenzyme Q10 (CO Q 10 PO) Take 300 mg by mouth daily at 12 noon.   Cyanocobalamin (VITAMIN B-12 PO) Take 1 tablet by mouth every other day.   losartan (COZAAR) 100 MG tablet Take 1 tablet (100 mg total) by mouth daily. (Patient taking differently: Take 50 mg by mouth daily.)   MAGNESIUM CITRATE PO Take 250 mg by mouth every other day.   metoprolol tartrate (LOPRESSOR) 25 MG tablet Take 1 tab twice daily as needed only for heart rate greater than 100 beats per minute.   Multiple Vitamin (MULTIVITAMIN WITH MINERALS) TABS tablet Take 1 tablet by mouth every other day.   rosuvastatin (CRESTOR) 20 MG tablet TAKE 1 TABLET BY MOUTH ONCE DAILY IN THE EVENING   tamsulosin (FLOMAX) 0.4 MG CAPS capsule Take 0.4 mg by mouth at bedtime.   VITAMIN E PO Take 1 tablet by mouth every other day.   [DISCONTINUED] metoprolol succinate (TOPROL-XL) 50 MG 24 hr tablet Take 50 mg by mouth daily.   Physical Exam:    VS:  BP 114/64 (BP Location: Left Arm, Patient Position: Sitting, Cuff Size: Normal)   Pulse (!) 55   Ht 5\' 6"  (1.676 m)   Wt 146 lb 12.8 oz (66.6 kg)   SpO2 95%   BMI 23.69 kg/m    Wt Readings from Last 3 Encounters:  05/18/23 146 lb 12.8 oz (66.6 kg)  05/17/23 150 lb (68 kg)  05/06/23 148 lb (67.1 kg)    GEN:  Well nourished, well developed in no acute distress NECK: No JVD; No carotid bruits CARDIAC: RRR, grade 3/6 holo-diastolic murmur, no rubs, gallops RESPIRATORY:  Clear to auscultation without rales, wheezing or rhonchi  ABDOMEN: Soft, non-tender, non-distended EXTREMITIES:  No edema; No acute deformity   Asessement and Plan:.    PAF: Noted to have an episode of PAF when he had an ILR in 2019.  He underwent watchman placement in 2023.  Recent breakthrough atrial fibrillation resulting in two ED visits. EKG today shows sinus bradycardia at 55 bpm. He has not taken metoprolol succinate recently. He notes increased fatigue when in afib but denies feeling of palpitations or increased heart rate, he is unsure how often he is in afib. Will have him wear a 2 week ZIO monitor to assess burden and refer him to the afib clinic. Reviewed ED precautions. No need for anticoagulation for stroke prophylaxis given Watchman LAA occluder. He has taken Toprol-XL only twice, notes concern for bradycardia as he has experienced in the past. Stop metoprolol succinate. Start metoprolol tartrate 25 mg twice daily as needed for heart rate greater than 100 bpm.   CAD: s/p remote PTCA of LAD in 1995. Today he denies anginal symptoms. No indication for ischemic evaluations at this time. Heart healthy diet and regular cardiovascular exercise encouraged.  Continue aspirin 81 mg daily, Losartan 50 mg daily and rosuvastatin 20 mg daily.   Hyperlipidemia: Continue rosuvastatin 20 mg daily, consider checking fasting lipid profile on follow up.   Hypertension: Blood pressure today 114/64. His PCP recently discontinued his amlodipine and decreased his Losartan to 50 mg daily. Continue Losartan 50 mg daily.   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  Echo in 12/2022 showed moderate to severe LVEF 40 to 45%.  Moderate to severe AI. Repeat echo scheduled for February, 2025. It was noted that if LV function worsens to will switch losartan to  Entresto.  Recommend optimal blood pressure control. Noted to not be a good candidate for AVR.   Anemia: Hemoglobin 12.7 on 05/06/23, at ED 05/17/23 his hemoglobin was 11. He denies recent bleeding problems. He will follow up with his PCP for further review.     Disposition: F/u with Dr. Swaziland as scheduled on 06/23/23.   Signed, Rip Harbour, NP

## 2023-05-18 ENCOUNTER — Ambulatory Visit (INDEPENDENT_AMBULATORY_CARE_PROVIDER_SITE_OTHER): Payer: Medicare Other

## 2023-05-18 ENCOUNTER — Encounter: Payer: Self-pay | Admitting: Nurse Practitioner

## 2023-05-18 ENCOUNTER — Ambulatory Visit (INDEPENDENT_AMBULATORY_CARE_PROVIDER_SITE_OTHER): Payer: Medicare Other | Admitting: Cardiology

## 2023-05-18 VITALS — BP 114/64 | HR 55 | Ht 66.0 in | Wt 146.8 lb

## 2023-05-18 DIAGNOSIS — R000 Tachycardia, unspecified: Secondary | ICD-10-CM | POA: Diagnosis not present

## 2023-05-18 DIAGNOSIS — I480 Paroxysmal atrial fibrillation: Secondary | ICD-10-CM

## 2023-05-18 DIAGNOSIS — I351 Nonrheumatic aortic (valve) insufficiency: Secondary | ICD-10-CM

## 2023-05-18 DIAGNOSIS — Z95818 Presence of other cardiac implants and grafts: Secondary | ICD-10-CM | POA: Insufficient documentation

## 2023-05-18 DIAGNOSIS — I10 Essential (primary) hypertension: Secondary | ICD-10-CM

## 2023-05-18 DIAGNOSIS — D649 Anemia, unspecified: Secondary | ICD-10-CM

## 2023-05-18 DIAGNOSIS — I2510 Atherosclerotic heart disease of native coronary artery without angina pectoris: Secondary | ICD-10-CM

## 2023-05-18 DIAGNOSIS — G459 Transient cerebral ischemic attack, unspecified: Secondary | ICD-10-CM

## 2023-05-18 MED ORDER — METOPROLOL TARTRATE 25 MG PO TABS: ORAL_TABLET | ORAL | 3 refills | Status: AC

## 2023-05-18 NOTE — Patient Instructions (Addendum)
Medication Instructions:  Start Metoprolol Tartrate 25 mg twice daily as needed only for heart reate greater than 100 bpm.  Stop Metoprolol Succinate as directed  *If you need a refill on your cardiac medications before your next appointment, please call your pharmacy*   Lab Work: Magnesium & TSH today.   Testing/Procedures: ZIO AT Long term monitor-Live Telemetry  Your physician has requested you wear a ZIO patch monitor for 14 days.  This is a single patch monitor. [employer] supplies one patch monitor per enrollment. Additional  stickers are not available.  Please do not apply patch if you will be having a Nuclear Stress Test, Echocardiogram, Cardiac CT, MRI,  or Chest Xray during the period you would be wearing the monitor. The patch cannot be worn during  these tests. You cannot remove and re-apply the ZIO AT patch monitor.  Your ZIO patch monitor will be mailed 3 day USPS to your address on file. It may take 3-5 days to  receive your monitor after you have been enrolled.  Once you have received your monitor, please review the enclosed instructions. Your monitor has  already been registered assigning a specific monitor serial # to you.   Billing and Patient Assistance Program information  Meredeth Ide has been supplied with any insurance information on record for billing. [employer] offers a sliding scale Patient Assistance Program for patients without insurance, or whose  insurance does not completely cover the cost of the ZIO patch monitor. You must apply for the  Patient Assistance Program to qualify for the discounted rate. To apply, call [employer] at [phone],  select option 4, select option 2 , ask to apply for the Patient Assistance Program, (you can request an  interpreter if needed). [employer] will ask your household income and how many people are in your  household. [employer] will quote your out-of-pocket cost based on this information. They will also be able  to set up a 12 month  interest free payment plan if needed.  Applying the monitor   Shave hair from upper left chest.  Hold the abrader disc by orange tab. Rub the abrader in 40 strokes over left upper chest as indicated in  your monitor instructions.  Clean area with 4 enclosed alcohol pads. Use all pads to ensure the area is cleaned thoroughly. Let  dry.  Apply patch as indicated in monitor instructions. Patch will be placed under collarbone on left side of  chest with arrow pointing upward.  Rub patch adhesive wings for 2 minutes. Remove the white label marked "1". Remove the white label  marked "2". Rub patch adhesive wings for 2 additional minutes.  While looking in a mirror, press and release button in center of patch. A small green light will flash 3-4  times. This will be your only indicator that the monitor has been turned on.  Do not shower for the first 24 hours. You may shower after the first 24 hours.  Press the button if you feel a symptom. You will hear a small click. Record Date, Time and Symptom in  the Patient Log.   Starting the Gateway  In your kit there is a Audiological scientist box the size of a cellphone. This is Buyer, retail. It transmits all your  recorded data to [facility]. This box must always stay within 10 feet of you. Open the box and push the *  button. There will be a light that blinks orange and then green a few times. When the light stops  blinking, the Gateway is connected to the ZIO patch. Call [employer] at [phone] to confirm your monitor is transmitting.  Returning your monitor  Remove your patch and place it inside the Gateway. In the lower half of the Gateway there is a white  bag with prepaid postage on it. Place Gateway in bag and seal. Mail package back to [place] as soon as  possible. Your physician should have your final report approximately 7 days after you have mailed back  your monitor. Call [facility] Customer Care at [phone] if you have questions  regarding your ZIO AT  patch monitor. Call them immediately if you see an orange light blinking on your monitor.  If your monitor falls off in less than 4 days, contact our Monitor department at [phone]. If your  monitor becomes loose or falls off after 4 days call [employer] at [phone] for suggestions on  securing your monitor    Follow-Up: At [facility], you and your health needs are our priority.  As part of our continuing mission to provide you with exceptional heart care, we have created designated Provider Care Teams.  These Care Teams include your primary Cardiologist (physician) and Advanced Practice Providers (APPs -  Physician Assistants and Nurse Practitioners) who all work together to provide you with the care you need, when you need it.  We recommend signing up for the patient portal called "MyChart".  Sign up information is provided on this After Visit Summary.  MyChart is used to connect with patients for Virtual Visits (Telemedicine).  Patients are able to view lab/test results, encounter notes, upcoming appointments, etc.  Non-urgent messages can be sent to your provider as well.   To learn more about what you can do with MyChart, go to [email or web address].    Your next appointment:    Keep follow up   Provider:   Peter Swaziland, MD     Other Instructions Please see your Primary Care Physician as directed.

## 2023-05-18 NOTE — Progress Notes (Unsigned)
Enrolled for [employer] to mail a ZIO XT long term holter monitor to the patients address on file.   Dr. Jordan to read. 

## 2023-05-19 LAB — TSH: TSH: 1.6 u[IU]/mL (ref 0.450–4.500)

## 2023-05-19 LAB — MAGNESIUM: Magnesium: 2.3 mg/dL (ref 1.6–2.3)

## 2023-05-20 NOTE — Progress Notes (Deleted)
Cardiology Office Note Date:  05/20/2023   ID:  IZEYAH [name], DOB 25-Aug-1940, MRN [id number]  PCP:  [name], MD  Cardiologist:  Norvil Martensen [place] MD  No chief complaint on file.  History of Present Illness: Edward [name] is a 82 y.o. male who is seen for evaluation of CAD.   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  His evaluation included an echocardiogram, carotid ultrasound, CT and MRI studies of the brain.  All of these were unrevealing.  There was no evidence of cortical infarction on his neuroimaging studies.  He had no arrhythmia documented.  His  TEE demonstrated a small PFO. He was seen by Dr. Excell Seltzer for consideration of PFO closure but after discussion with Neurology this was not felt to be indication. He subsequently had an implantable loop recorder placed by Dr Ladona Ridgel. He had a single  episode of AFib and he was started on Eliquis. 2 days after starting Eliquis he developed a hematoma in his left shoulder. Eliquis was reduced for 2 weeks then increase back to 5 mg bid. Plavix was stoppped.   He has a history of CAD with remote angioplasty of the LAD in 1995.  He really has never had recurrent angina. He does have  a history of vertigo. Last loop recorder check on Dec 29 showed only one episode of Afib lasting minutes.  He was seen in January with transient confusion of unclear etiology. Carotid dopplers showed no significant stenosis. Seen by Dr Everlena Cooper. EEG on 10/15/2020 was normal.  MRI of brain without contrast on 10/30/2020  showed moderate  Cerebral chronic small vessel ischemic changes but no stroke or other acute intracranial abnormalities. We discussed his PFO but given uncertainty regarding diagnosis did not feel that closure of PFO was indicated.   He was then considered for a Watchman device in order not to have to be on anticoagulation. He was seen by Dr Lalla Brothers and underwent left atrial appendage  occlusive device placement with Watchman FLX 27mm in May.   He was seen in May by [name] for some atypical chest pain and palpitations following an acute viral illness. States he woke up with flu like symptoms. Home Covid test was negative. Was so weak he couldn't stand. Went to ED but left after not being seen for 4 hours. Was seen in our office and Ecg was stable.  He did have follow up Echo which showed some reduction in LV function with moderate to severe AI and multiple jets. Watchman device was stable.   [name] presented to the ED on 05/06/23 with 4 days of generalized weakness and malaise.  He took a COVID test which was negative.  He gone to his PCPs office who noted on EKG that he was in atrial fibrillation with RVR and recommended he present to the hospital.  In the ED he spontaneously converted to sinus rhythm and was feeling better.  Evaluated by Dr. Rennis Golden in the ED and he was encouraged to start metoprolol that was filled by his PCP.  His troponin was initially elevated at 34, decreased to 12 prior to discharge.    On 05/17/23 he presented to the ED for elevated heart rate after mowing the lawn for about 1 hour, he noted increasing fatigue. He checked his pulse ox monitor and his heart rate was increased into the 120s.  EKG in the ED showed atrial fibrillation at a  rate of 122 bpm. Chest xray was unremarkable, labs were reassuring aside from hemoglobin of 11. His heart rate normalized and he left AMA prior to further evaluation. He was seen in our office on 9/30 and felt OK. Did not take metoprolol due to concern with bradycardia. 2 week event monitor placed.   On follow up today he is doing Ok. He is doing his yard work. He denies any chest pain, dyspnea, palpitations, edema. He does stay tired a lot and doesn't sleep well.     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 Lalla Brothers   Spermatocele    left   Stroke [facility])    TIA's   Urgency of urination    Vertigo     Past Surgical History:  Procedure Laterality Date   BALLOON DILATION N/A 07/22/2022   Procedure: Varney Biles BALLOON DILATION;  Surgeon: Jerilee Field, MD;  Location: [facility];  Service: Urology;  Laterality: N/A;  60 MINS   CORONARY ANGIOPLASTY  1995  DR [place]   LAD   CYSTOSCOPY N/A 09/18/2015   Procedure: CYSTOSCOPY;  Surgeon: Jerilee Field, MD;  Location: [facility];  Service: Urology;  Laterality: N/A;   CYSTOSCOPY WITH RETROGRADE PYELOGRAM, URETEROSCOPY AND STENT PLACEMENT Left 03/01/2013   Procedure: CYSTOSCOPY WITH LEFT RETROGRADE PYELOGRAM, LEFT URETEROSCOPY and stent PLACEMENT;  Surgeon: Antony Haste, MD;  Location[facility];  Service: Urology;  Laterality: Left;   EYE SURGERY Left    cataract surgery   FOOT FUSION Left    SECONDARY TO FX'S   GREEN LIGHT LASER TURP (TRANSURETHRAL RESECTION OF PROSTATE N/A 10/04/2013   Procedure: GREEN LIGHT LASER TURP (TRANSURETHRAL RESECTION OF PROSTATE;  Surgeon: Antony Haste, MD;  Location: [facility];  Service: Urology;  Laterality: N/A;   INGUINAL HERNIA REPAIR Bilateral    LEFT ATRIAL APPENDAGE OCCLUSION N/A 01/09/2022   Procedure: LEFT ATRIAL APPENDAGE OCCLUSION;  Surgeon: Lanier Prude, MD;  Location: [facility];  Service: Cardiovascular;  Laterality: N/A;   LOOP RECORDER INSERTION N/A 04/20/2018   Procedure: LOOP RECORDER INSERTION;  Surgeon: Marinus Maw, MD;  Location: [facility];  Service:  Cardiovascular;  Laterality: N/A;   SHOULDER ARTHROSCOPY Right 08/18/2002   SHOULDER ARTHROSCOPY WITH DEBRIDEMENT AND BICEP TENDON REPAIR Left 04/28/2011   SPERMATOCELECTOMY Left 10/04/2013   Procedure: LEFT SPERMATOCELECTOMY;  Surgeon: Antony Haste, MD;  Location: [facility];  Service: Urology;  Laterality: Left;   TEE WITHOUT CARDIOVERSION N/A 02/16/2018   Procedure: TRANSESOPHAGEAL ECHOCARDIOGRAM (TEE);  Surgeon: Chrystie Nose, MD;  Location: [facility] ENDOSCOPY;  Service: Cardiovascular;  Laterality: N/A;   TEE WITHOUT CARDIOVERSION N/A 01/09/2022   Procedure: TRANSESOPHAGEAL ECHOCARDIOGRAM (TEE);  Surgeon: Lanier Prude, MD;  [facility]: [facility] INVASIVE CV LAB;  Service: Cardiovascular;  Laterality: N/A;   TRANSURETHRAL RESECTION OF PROSTATE  N/A 12/11/2014   Procedure: CYSTOSCOPY, CLOT EVACUATION, WITH FULGURATION ;  Surgeon: Bjorn Pippin, MD;  Location: [facility];  Service: Urology;  Laterality: N/A;   TRANSURETHRAL RESECTION OF PROSTATE N/A 09/18/2015   Procedure: TRANSURETHRAL RESECTION OF THE PROSTATE (TURP);  Surgeon: Jerilee Field, MD;  Location: [facility];  Service: Urology;  Laterality: N/A;   UMBILICAL HERNIA REPAIR  06/02/2008   UMBILICAL HERNIA REPAIR N/A 11/12/2021   Procedure: UMBILICAL HERNIA REPAIR;  Surgeon: Abigail Miyamoto, MD;  Location: [facility];  Service: General;  Laterality: N/A;  LMA    Current Outpatient Medications  Medication Sig Dispense Refill   acetaminophen (TYLENOL) 500 MG tablet Take 500 mg by mouth every 6 (six) hours as needed for moderate pain or mild pain.     aspirin EC 81 MG tablet Take 81 mg by mouth daily. Swallow whole.     budesonide-formoterol (SYMBICORT) 160-4.5 MCG/ACT inhaler Inhale 1 puff into the lungs as needed (chest tightness).     Cholecalciferol (VITAMIN D3 PO) Take 1 tablet by mouth every other day.     Coenzyme Q10 (CO Q 10 PO) Take 300 mg by mouth daily at 12 noon.     Cyanocobalamin (VITAMIN B-12 PO) Take 1 tablet by mouth every other  day.     losartan (COZAAR) 100 MG tablet Take 1 tablet (100 mg total) by mouth daily. (Patient taking differently: Take 50 mg by mouth daily.) 90 tablet 3   MAGNESIUM CITRATE PO Take 250 mg by mouth every other day.     metoprolol tartrate (LOPRESSOR) 25 MG tablet Take 1 tab twice daily as needed only for heart rate greater than 100 beats per minute. 180 tablet 3   Multiple Vitamin (MULTIVITAMIN WITH MINERALS) TABS tablet Take 1 tablet by mouth every other day.     rosuvastatin (CRESTOR) 20 MG tablet TAKE 1 TABLET BY MOUTH ONCE DAILY IN THE EVENING 90 tablet 3   tamsulosin (FLOMAX) 0.4 MG CAPS capsule Take 0.4 mg by mouth at bedtime.     VITAMIN E PO Take 1 tablet by mouth every other day.     No current facility-administered medications for this visit.    Allergies:   Augmentin [amoxicillin-pot clavulanate], Codeine, Levaquin [levofloxacin], and Zocor [simvastatin]   Social History:  The patient  reports that he quit smoking about 59 years ago. His smoking use included cigarettes. He started smoking about 62 years ago. He has a 1.5 pack-year smoking history. He has never used smokeless tobacco. He reports current alcohol use. He reports that he does not use drugs.   Family History:  The patient's family history is not on file.    ROS:  Please see the history of present illness.  All other systems are reviewed and negative.    PHYSICAL EXAM: VS:  There were no vitals taken for this visit. , BMI There is no height or weight on file to calculate BMI. GEN: Well nourished, well developed, in no acute distress  HEENT: normal  Neck: no JVD, no masses. No carotid bruits Cardiac: RRR  No gallop. There is a gr 3/6 holo-diastolic murmur LSB to apex.        Respiratory:  clear to auscultation bilaterally, normal work of breathing GI: soft, nontender, nondistended, + BS MS: no deformity or atrophy. There is a large mature bruise in the left shoulder and upper arm. Ext: no pretibial edema, pedal  pulses 2+= bilaterally Skin: warm and dry, no rash Neuro:  Strength and sensation are  intact Psych: euthymic mood, full affect  EKG:  EKG is not ordered today.      Recent Labs: 05/17/2023: BUN 19; Creatinine, Ser 1.08; Hemoglobin 11.0; Platelets 249; Potassium 4.2; Sodium 138 05/18/2023: Magnesium 2.3; TSH 1.600   Lipid Panel     Component Value Date/Time   CHOL 117 01/15/2018 0453   TRIG 64 01/15/2018 0453   HDL 41 01/15/2018 0453   CHOLHDL 2.9 01/15/2018 0453   VLDL 13 01/15/2018 0453   LDLCALC 63 01/15/2018 0453   Dated 04/09/18: cholesterol 134, triglycerides 87, HDL 45, LDL 72. CBC, chemistries, TSH normal. Dated 04/21/19: cholesterol 118, triglycerides 66, HDL 40, LDL 65. CMET, CBC, TSH normal Dated 04/25/20: cholesterol 120, triglycerides 46, HDL 44, LDL 67.  Dated 09/24/20: normal CMET Dated 02/24/22: cholesterol 111, triglycerides 79, HDL 47, LDL 49. CMET and TSH normal Dated 04/15/22: Hgb 11.2.   Wt Readings from Last 3 Encounters:  05/18/23 146 lb 12.8 oz (66.6 kg)  05/17/23 150 lb (68 kg)  05/06/23 148 lb (67.1 kg)     Cardiac Studies Reviewed: TEE 02-16-2018: Study Conclusions   - Left ventricle: The cavity size was normal. There was mild   concentric hypertrophy. Systolic function was normal. The   estimated ejection fraction was in the range of 55% to 60%. Wall   motion was normal; there were no regional wall motion   abnormalities. - Aortic valve: Trileaflet. Mild to moderate eccentric AI, 2 jets,   the larger jet is toward the anterior mitral valve leaflet. - Aorta: Dilated sinus of valsalva to 4.1 cm. - Left atrium: Mildly dilated. No evidence of thrombus in the   atrial cavity or appendage. - Right atrium: No evidence of thrombus in the atrial cavity or   appendage. - Atrial septum: There is a small PFO by saline microbubble   contrast which demonstrates intermittent right to left flow.   Impressions:   - Small PFO with intermittent right to left flow  noted by saline   microbubble contrast. This can be a possible mechanism for his   TIA. Consider referral to Dr. Excell Seltzer with the structural heart   clinic for closure evaluation.  Echo 01/02/23:  IMPRESSIONS     1. Left ventricular ejection fraction, by estimation, is 40 to 45%. The  left ventricle has mildly decreased function. The left ventricle  demonstrates global hypokinesis. The left ventricular internal cavity size  was mildly dilated. Left ventricular  diastolic parameters are consistent with Grade II diastolic dysfunction  (pseudonormalization).   2. Right ventricular systolic function is normal. The right ventricular  size is normal.   3. Left atrial size was severely dilated.   4. No obvious residual ASD from Watchman procedure noted on TTE.   5. Right atrial size was moderately dilated.   6. The mitral valve is normal in structure. Mild mitral valve  regurgitation. No evidence of mitral stenosis.   7. There are several jets of AI overall moderate to severe. . The aortic  valve is tricuspid. 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 No  aortic stenosis is present. Aortic regurgitation PHT measures 376 msec.   8. Aortic dilatation noted. There is borderline dilatation of the aortic  root, measuring 38 mm.   9. The inferior vena cava is normal in size with greater than 50%  respiratory variability, suggesting right atrial pressure of 3 mmHg.   Assessment/Plan:  1. CAD remote PTCA of the LAD in 1995. No recurrent angina. Continue amlodipine and statin.  Continue ASA and statin therapy. Continue amlodipine.  2. Paroxysmal Afib. Noted on loop recorder. He is  asymptomatic but has had prior TIAs.  S/p placement of Watchman device. Off anticoagulation now. Did have significant hematuria resolved.  3. HLD  on Crestor- follow up lab work with Dr Felipa Eth this week.  4. HTN well controlled.  5. Transient confusion. Negative Neurologic evaluation.  6. 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 Moderate to severe by TTE - asymptomatic. LV function mildly impaired on recent Echo. I would like to repeat Echo in 6 months. If LV function worse will switch losartan to Entresto. He is not a good candidate for AVR at this point  I will follow up in 6 months   Signed, Corianna Avallone [place], MD  05/20/2023 12:58 PM    [facility] [address] [place], [place], [place]  [address] Phone: [phone]; Fax: [phone]

## 2023-05-21 ENCOUNTER — Ambulatory Visit: Payer: Medicare Other | Admitting: Cardiology

## 2023-05-21 DIAGNOSIS — I480 Paroxysmal atrial fibrillation: Secondary | ICD-10-CM

## 2023-06-10 ENCOUNTER — Ambulatory Visit: Payer: Medicare Other | Admitting: Cardiology

## 2023-06-10 ENCOUNTER — Inpatient Hospital Stay (HOSPITAL_COMMUNITY): Admission: RE | Admit: 2023-06-10 | Payer: Medicare Other | Source: Ambulatory Visit | Admitting: Internal Medicine

## 2023-06-11 NOTE — Progress Notes (Signed)
Cardiology Office Note Date:  06/23/2023   ID:  Edward [name], DOB Aug 01, 1941, MRN [id number]  PCP:  [name], MD  Cardiologist:  Zerah Hilyer [place] MD  Chief Complaint  Patient presents with   Atrial Fibrillation   History of Present Illness: Edward [name] is a 82 y.o. male who is seen for evaluation of CAD.   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  His evaluation included an echocardiogram, carotid ultrasound, CT and MRI studies of the brain.  All of these were unrevealing.  There was no evidence of cortical infarction on his neuroimaging studies.  He had no arrhythmia documented.  His  TEE demonstrated a small PFO. He was seen by Dr. Excell Seltzer for consideration of PFO closure but after discussion with Neurology this was not felt to be indication. He subsequently had an implantable loop recorder placed by Dr Ladona Ridgel. He had a single  episode of AFib and he was started on Eliquis. 2 days after starting Eliquis he developed a hematoma in his left shoulder. Eliquis was reduced for 2 weeks then increase back to 5 mg bid. Plavix was stoppped.   He has a history of CAD with remote angioplasty of the LAD in 1995.  He really has never had recurrent angina. He does have  a history of vertigo. Last loop recorder check on Dec 29 showed only one episode of Afib lasting minutes.  He was seen in January with transient confusion of unclear etiology. Carotid dopplers showed no significant stenosis. Seen by Dr Everlena Cooper. EEG on 10/15/2020 was normal.  MRI of brain without contrast on 10/30/2020  showed moderate  Cerebral chronic small vessel ischemic changes but no stroke or other acute intracranial abnormalities. We discussed his PFO but given uncertainty regarding diagnosis did not feel that closure of PFO was indicated.   He was then considered for a Watchman device in order not to have to be on anticoagulation. He was seen by Dr Lalla Brothers  and underwent left atrial appendage occlusive device placement with Watchman FLX 27mm in May.   He was seen in May by [name] for some atypical chest pain and palpitations following an acute viral illness. States he woke up with flu like symptoms. Home Covid test was negative. Was so weak he couldn't stand. Went to ED but left after not being seen for 4 hours. Was seen in our office and Ecg was stable.  He did have follow up Echo which showed some reduction in LV function with moderate to severe AI and multiple jets. Watchman device was stable.   Edward [name] presented to the ED on 05/06/23 with 4 days of generalized weakness and malaise.  He took a COVID test which was negative.  He gone to his PCPs office who noted on EKG that he was in atrial fibrillation with RVR and recommended he present to the hospital.  In the ED he spontaneously converted to sinus rhythm and was feeling better.  Evaluated by Dr. Rennis Golden in the ED and he was encouraged to start metoprolol that was filled by his PCP.  His troponin was initially elevated at 34, decreased to 12 prior to discharge.    On 05/17/23 he presented to the ED for elevated heart rate after mowing the lawn for about 1 hour, he noted increasing fatigue. He checked his pulse ox monitor and his heart rate was increased into the 120s.  EKG in the  ED showed atrial fibrillation at a rate of 122 bpm. Chest xray was unremarkable, labs were reassuring aside from hemoglobin of 11. His heart rate normalized and he left AMA prior to further evaluation. He was seen in our office on 9/30 and felt OK. Did not take metoprolol due to concern with bradycardia. 2 week event monitor placed. This showed Afib burden < 1%. NSR. Frequent PACs with multiple brief runs of SVT. Rare NSVT 5 beats.   On follow up today he is doing Ok. He states he is still recovering from Covid. Weak. Not as active as he used to be and did loose weight. Denies any SOB or edema. Hasn't really felt need to  take prn metoprolol. HR may get up to 90s with activity.     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 Lalla Brothers   Spermatocele    left   Stroke [facility])    TIA's   Urgency of urination    Vertigo     Past Surgical History:  Procedure Laterality Date   BALLOON DILATION N/A 07/22/2022   Procedure: Varney Biles BALLOON DILATION;  Surgeon: Jerilee Field, MD;  Location: [facility];  Service: Urology;  Laterality: N/A;  60 MINS   CORONARY ANGIOPLASTY  1995  DR [place]   LAD   CYSTOSCOPY N/A 09/18/2015   Procedure: CYSTOSCOPY;  Surgeon: Jerilee Field, MD;  Location: [facility];  Service: Urology;  Laterality: N/A;   CYSTOSCOPY WITH RETROGRADE PYELOGRAM, URETEROSCOPY AND STENT PLACEMENT Left 03/01/2013   Procedure: CYSTOSCOPY WITH LEFT RETROGRADE PYELOGRAM, LEFT URETEROSCOPY and stent PLACEMENT;  Surgeon: Antony Haste, MD;  Location: [facility];  Service: Urology;  Laterality: Left;   EYE SURGERY Left    cataract surgery   FOOT FUSION Left    SECONDARY TO FX'S   GREEN LIGHT LASER TURP (TRANSURETHRAL RESECTION OF PROSTATE N/A 10/04/2013   Procedure: GREEN LIGHT LASER TURP (TRANSURETHRAL RESECTION OF PROSTATE;  Surgeon: Antony Haste, MD;  Location: [facility];  Service: Urology;  Laterality: N/A;   INGUINAL HERNIA REPAIR Bilateral    LEFT ATRIAL APPENDAGE OCCLUSION N/A 01/09/2022   Procedure: LEFT ATRIAL APPENDAGE OCCLUSION;  Surgeon: Lanier Prude, MD;  Location: [facility]  INVASIVE CV LAB;  Service: Cardiovascular;  Laterality: N/A;   LOOP RECORDER INSERTION N/A 04/20/2018   Procedure: LOOP RECORDER INSERTION;  Surgeon: Marinus Maw, MD;  Location: [facility] INV[facility] CV [facility];  Service: Cardiovascular;  Laterality: N/A;   SHOULDER ARTHROSCOPY Right 08/18/2002   SHOULDER ARTHROSCOPY WITH DEBRIDEMENT AND BICEP TENDON REPAIR Left 04/28/2011   SPERMATOCELECTOMY Left 10/04/2013   Procedure: LEFT SPERMATOCELECTOMY;  Surgeon: Antony Haste, MD;  Location: [facility];  Service: Urology;  Laterality: Left;   TEE WITHOUT CARDIOVERSION N/A 02/16/2018   Procedure: TRANSESOPHAGEAL ECHOCARDIOGRAM (TEE);  Surgeon: Chrystie Nose, MD;  Location: [facility];  Service: Cardiovascular;  Laterality: N/A;   TEE WITHOUT CARDIOVERSION N/A 01/09/2022   Procedure: TRANSESOPHAGEAL ECHOCARDIOGRAM (TEE);  Surgeon: Lanier Prude, MD;  Location: [facility] INVASIVE CV LAB;  Service: Cardiovascular;  Laterality: N/A;   TRANSURETHRAL RESECTION OF PROSTATE N/A 12/11/2014   Procedure: CYSTOSCOPY, CLOT EVACUATION, WITH FULGURATION ;  Surgeon: Bjorn Pippin, MD;  Location: [facility];  Service: Urology;  Laterality: N/A;   TRANSURETHRAL RESECTION OF PROSTATE N/A 09/18/2015   Procedure: TRANSURETHRAL RESECTION OF THE PROSTATE (TURP);  Surgeon: Jerilee Field, MD;  Location: [facility];  Service: Urology;  Laterality: N/A;   UMBILICAL HERNIA REPAIR  06/02/2008   UMBILICAL HERNIA REPAIR N/A 11/12/2021   Procedure: UMBILICAL HERNIA REPAIR;  Surgeon: Abigail Miyamoto, MD;  Location: [facility];  Service: General;  Laterality: N/A;  LMA    Current Outpatient Medications  Medication Sig Dispense Refill   acetaminophen (TYLENOL) 500 MG tablet Take 500 mg by mouth every 6 (six) hours as needed for moderate pain or mild pain.     aspirin EC 81 MG tablet Take 81 mg by mouth daily. Swallow whole.     budesonide-formoterol (SYMBICORT) 160-4.5 MCG/ACT inhaler Inhale 1 puff into the lungs as needed (chest tightness).      Cholecalciferol (VITAMIN D3 PO) Take 1 tablet by mouth every other day.     Coenzyme Q10 (CO Q 10 PO) Take 300 mg by mouth daily at 12 noon.     Cyanocobalamin (VITAMIN B-12 PO) Take 1 tablet by mouth every other day.     MAGNESIUM CITRATE PO Take 250 mg by mouth every other day.     metoprolol tartrate (LOPRESSOR) 25 MG tablet Take 1 tab twice daily as needed only for heart rate greater than 100 beats per minute. 180 tablet 3   Multiple Vitamin (MULTIVITAMIN WITH MINERALS) TABS tablet Take 1 tablet by mouth every other day.     rosuvastatin (CRESTOR) 20 MG tablet TAKE 1 TABLET BY MOUTH ONCE DAILY IN THE EVENING 90 tablet 3   sacubitril-valsartan (ENTRESTO) 24-26 MG Take 1 tablet by mouth 2 (two) times daily. 60 tablet 11   tamsulosin (FLOMAX) 0.4 MG CAPS capsule Take 0.4 mg by mouth at bedtime.     VITAMIN E PO Take 1 tablet by mouth every other day.     No current facility-administered medications for this visit.    Allergies:   Augmentin [amoxicillin-pot clavulanate], Codeine, Levaquin [levofloxacin], and Zocor [simvastatin]   Social History:  The patient  reports that he quit smoking about 59 years ago. His smoking use included cigarettes. He started smoking about 62 years ago. He has a 1.5 pack-year smoking history. He has never used smokeless tobacco. He reports current alcohol use. He reports that he does not use drugs.   Family History:  The patient's family history is not on file.    ROS:  Please see the history of present illness.  All other systems are reviewed and negative.    PHYSICAL EXAM: VS:  BP (!) 152/56 (BP Location: Left Arm, Patient Position: Sitting, Cuff Size: Normal)   Pulse 72   Ht 5\' 7"  (1.702 m)   Wt 149 lb (67.6 kg)   SpO2 95%   BMI 23.34 kg/m  , BMI Body mass index is 23.34 kg/m. GEN: Well nourished, well developed, in no acute distress  HEENT: normal  Neck: no JVD, no masses. No carotid bruits Cardiac: RRR  No gallop. There is a gr 3/6 holo-diastolic  murmur LSB to apex.  Respiratory:  clear to auscultation bilaterally, normal work of breathing GI: soft, nontender, nondistended, + BS MS: no deformity or atrophy. There is a large mature bruise in the left shoulder and upper arm. Ext: no pretibial edema, pedal pulses 2+= bilaterally Skin: warm and dry, no rash Neuro:  Strength and sensation are intact Psych: euthymic mood, full affect  EKG:  EKG is not ordered today.      Recent Labs: 05/17/2023: BUN 19; Creatinine, Ser 1.08; Hemoglobin 11.0; Platelets 249; Potassium 4.2; Sodium 138 05/18/2023: Magnesium 2.3; TSH 1.600   Lipid Panel     Component Value Date/Time   CHOL 117 01/15/2018 0453   TRIG 64 01/15/2018 0453   HDL 41 01/15/2018 0453   CHOLHDL 2.9 01/15/2018 0453   VLDL 13 01/15/2018 0453   LDLCALC 63 01/15/2018 0453   Dated 04/09/18: cholesterol 134, triglycerides 87, HDL 45, LDL 72. CBC, chemistries, TSH normal. Dated 04/21/19: cholesterol 118, triglycerides 66, HDL 40, LDL 65. CMET, CBC, TSH normal Dated 04/25/20: cholesterol 120, triglycerides 46, HDL 44, LDL 67.  Dated 09/24/20: normal CMET Dated 02/24/22: cholesterol 111, triglycerides 79, HDL 47, LDL 49. CMET and TSH normal Dated 04/15/22: Hgb 11.2.   Wt Readings from Last 3 Encounters:  06/23/23 149 lb (67.6 kg)  05/18/23 146 lb 12.8 oz (66.6 kg)  05/17/23 150 lb (68 kg)     Cardiac Studies Reviewed: TEE 02-16-2018: Study Conclusions   - Left ventricle: The cavity size was normal. There was mild   concentric hypertrophy. Systolic function was normal. The   estimated ejection fraction was in the range of 55% to 60%. Wall   motion was normal; there were no regional wall motion   abnormalities. - Aortic valve: Trileaflet. Mild to moderate eccentric AI, 2 jets,   the larger jet is toward the anterior mitral valve leaflet. - Aorta: Dilated sinus of valsalva to 4.1 cm. - Left atrium: Mildly dilated. No evidence of thrombus in the   atrial cavity or appendage. -  Right atrium: No evidence of thrombus in the atrial cavity or   appendage. - Atrial septum: There is a small PFO by saline microbubble   contrast which demonstrates intermittent right to left flow.   Impressions:   - Small PFO with intermittent right to left flow noted by saline   microbubble contrast. This can be a possible mechanism for his   TIA. Consider referral to Dr. Excell Seltzer with the structural heart   clinic for closure evaluation.  Echo 01/02/23:  IMPRESSIONS     1. Left ventricular ejection fraction, by estimation, is 40 to 45%. The  left ventricle has mildly decreased function. The left ventricle  demonstrates global hypokinesis. The left ventricular internal cavity size  was mildly dilated. Left ventricular  diastolic parameters are consistent with Grade II diastolic dysfunction  (pseudonormalization).   2. Right ventricular systolic function is normal. The right ventricular  size is normal.   3. Left atrial size was severely dilated.   4. No obvious residual ASD from Watchman procedure noted on TTE.   5. Right atrial size was moderately dilated.   6. The mitral valve is normal in structure. Mild mitral valve  regurgitation. No evidence of mitral stenosis.   7. There are several jets of AI overall moderate to severe. . The aortic  valve is tricuspid. 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 No  aortic stenosis is present. Aortic regurgitation PHT measures 376 msec.   8. Aortic dilatation noted. There is borderline  dilatation of the aortic  root, measuring 38 mm.   9. The inferior vena cava is normal in size with greater than 50%  respiratory variability, suggesting right atrial pressure of 3 mmHg.   Event monitor 05/18/23: Patch Wear Time:  13 days and 23 hours (2024-10-03T06:14:39-0400 to 2024-10-17T06:14:28-0400)   Patient had a min HR of 32 bpm, max HR of 193 bpm, and avg HR of 71 bpm. Predominant underlying rhythm was Sinus Rhythm. 3 Ventricular  Tachycardia runs occurred, the run with the fastest interval lasting 5 beats with a max rate of 187 bpm, the longest  lasting 6 beats with an avg rate of 147 bpm. 2472 Supraventricular Tachycardia runs occurred, the run with the fastest interval lasting 5 beats with a max rate of 193 bpm, the longest lasting 10.3 secs with an avg rate of 137 bpm. Some episodes of  Supraventricular Tachycardia may be possible Atrial Tachycardia with variable block. Atrial Fibrillation occurred (<1% burden), ranging from 116-175 bpm (avg of 145 bpm), the longest lasting 1 min 49 secs with an avg rate of 145 bpm. 1 episode(s) of AV  Block (2nd) occurred, lasting a total of 4 secs. Isolated SVEs were occasional (4.9%, [id number]), SVE Couplets were occasional (1.9%, 13301), and SVE Triplets were occasional (2.2%, 10195). Isolated VEs were rare (<1.0%, 13940), VE Couplets were rare  (<1.0%, 87), and VE Triplets were rare (<1.0%, 2). Ventricular Bigeminy and Trigeminy were present. Difficulty discerning atrial activity making definitive diagnosis difficult to ascertain.  Assessment/Plan:  1. CAD remote PTCA of the LAD in 1995. No recurrent angina. Continue amlodipine and statin. Continue ASA and statin therapy. Continue amlodipine.  2. Paroxysmal Afib. Noted on loop recorder. He is  asymptomatic but has had prior TIAs.  S/p placement of Watchman device. Off anticoagulation now. He did have Afib with Covid this fall. Better now. Event monitor showed burden < 1% but does have some SVT. Has metoprolol to take PRN for elevated HT.  3. HLD  on Crestor 4. HTN well controlled.  5. Transient confusion. Negative Neurologic evaluation.  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 Moderate to severe by TTE - asymptomatic. LV function mildly impaired on recent Echo. I would like to repeat Echo at 6 months.will go ahead and switch losartan to Entresto 24/26 mg bid.    I will follow up in after Echo in Jan.    Signed, Vaanya Shambaugh [place], MD   06/23/2023 5:10 PM    [facility] [address] [place], [place], [place]  [address] Phone: [phone]; Fax: [phone]

## 2023-06-15 ENCOUNTER — Telehealth: Payer: Self-pay

## 2023-06-15 NOTE — Telephone Encounter (Signed)
Called patient to give results of below they verbalized understanding. And will keep their upcoming appointments.

## 2023-06-15 NOTE — Telephone Encounter (Signed)
-----   Message from [facility] sent at 06/12/2023  5:17 PM EDT ----- Please let Edward [name] know that his heart monitor showed his predominant rhythm was sinus rhythm. He did have some short runs of atrial fibrillation. He also had frequent runs of fast beats and early beats that originate from the top chambers of the heart. Typically we would treat this with medications called beta blockers which can slow the heart rate. Edward [name] noted concern with these medications at his last OV. In this case we can consider referral to EP, he previously saw Dr. Lalla Brothers for [facility] device or if he prefers he has follow up with Dr. [place] on 06/23/23 if he would prefer to discuss with him.

## 2023-06-23 ENCOUNTER — Encounter: Payer: Self-pay | Admitting: Cardiology

## 2023-06-23 ENCOUNTER — Ambulatory Visit: Payer: Medicare Other | Attending: Cardiology | Admitting: Cardiology

## 2023-06-23 ENCOUNTER — Ambulatory Visit: Payer: Medicare Other | Admitting: Cardiology

## 2023-06-23 VITALS — BP 152/56 | HR 72 | Ht 67.0 in | Wt 149.0 lb

## 2023-06-23 DIAGNOSIS — Z95818 Presence of other cardiac implants and grafts: Secondary | ICD-10-CM | POA: Insufficient documentation

## 2023-06-23 DIAGNOSIS — I10 Essential (primary) hypertension: Secondary | ICD-10-CM | POA: Insufficient documentation

## 2023-06-23 DIAGNOSIS — I351 Nonrheumatic aortic (valve) insufficiency: Secondary | ICD-10-CM | POA: Diagnosis not present

## 2023-06-23 DIAGNOSIS — I2510 Atherosclerotic heart disease of native coronary artery without angina pectoris: Secondary | ICD-10-CM | POA: Insufficient documentation

## 2023-06-23 DIAGNOSIS — I480 Paroxysmal atrial fibrillation: Secondary | ICD-10-CM | POA: Insufficient documentation

## 2023-06-23 MED ORDER — ENTRESTO 24-26 MG PO TABS: 1.0000 | ORAL_TABLET | Freq: Two times a day (BID) | ORAL | 11 refills | Status: DC

## 2023-06-23 MED ORDER — ENTRESTO 24-26 MG PO TABS: 1.0000 | ORAL_TABLET | Freq: Two times a day (BID) | ORAL | 0 refills | Status: DC

## 2023-06-23 NOTE — Patient Instructions (Addendum)
Medication Instructions:  Switched Lostartan to entresto 24-26 mg 1 tablet twice daily  *If you need a refill on your cardiac medications before your next appointment, please call your pharmacy*   Lab Work: None    Testing/Procedures: Echo  Schedule in January 2025 Your physician has requested that you have an echocardiogram. Echocardiography is a painless test that uses sound waves to create images of your heart. It provides your doctor with information about the size and shape of your heart and how well your heart's chambers and valves are working. This procedure takes approximately one hour. There are no restrictions for this procedure. Please do NOT wear cologne, perfume, aftershave, or lotions (deodorant is allowed). Please arrive 15 minutes prior to your appointment time.  Please note: We ask at that you not bring children with you during ultrasound (echo/ vascular) testing. Due to room size and safety concerns, children are not allowed in the ultrasound rooms during exams. Our front office staff cannot provide observation of children in our lobby area while testing is being conducted. An adult accompanying a patient to their appointment will only be allowed in the ultrasound room at the discretion of the ultrasound technician under special circumstances. We apologize for any inconvenience.    Follow-Up: At [facility], you and your health needs are our priority.  As part of our continuing mission to provide you with exceptional heart care, we have created designated Provider Care Teams.  These Care Teams include your primary Cardiologist (physician) and Advanced Practice Providers (APPs -  Physician Assistants and Nurse Practitioners) who all work together to provide you with the care you need, when you need it.  We recommend signing up for the patient portal called "MyChart".  Sign up information is provided on this After Visit Summary.  MyChart is used to connect with patients  for Virtual Visits (Telemedicine).  Patients are able to view lab/test results, encounter notes, upcoming appointments, etc.  Non-urgent messages can be sent to your provider as well.   To learn more about what you can do with MyChart, go to [email or web address].    Your next appointment:   4 week(s) after echo  Provider:   Peter [place], MD

## 2023-06-24 ENCOUNTER — Telehealth: Payer: Self-pay | Admitting: Cardiology

## 2023-06-24 NOTE — Telephone Encounter (Signed)
Pt c/o medication issue:  1. Name of Medication:   sacubitril-valsartan (ENTRESTO) 24-26 MG   2. How are you currently taking this medication (dosage and times per day)?    3. Are you having a reaction (difficulty breathing--STAT)?   4. What is your medication issue?   Patient stated this medication is too expensive for him and he wants to get alternate medication.

## 2023-06-24 NOTE — Telephone Encounter (Signed)
Dr. [place]   For the prescription Losartan 100mg  1 tablet daily  Would you like to send 90 tablets x 3 refills?

## 2023-06-24 NOTE — Telephone Encounter (Signed)
Dr. [place] Please advise:   Called and spoke to patient  Patient states:   -Cost of Edward [name] is >$500/month   -is unable to afford it   -pharmacy states there are less expensive alternative   -Took one tablet of sample this morning  Informed patient message sent to Dr. [place]  Patient has no further questions or concerns at this time

## 2023-06-25 ENCOUNTER — Encounter (HOSPITAL_COMMUNITY): Payer: Self-pay | Admitting: Internal Medicine

## 2023-06-25 ENCOUNTER — Ambulatory Visit (HOSPITAL_COMMUNITY)
Admission: RE | Admit: 2023-06-25 | Discharge: 2023-06-25 | Disposition: A | Discharge status: Home / Self Care | Payer: Medicare Other | Source: Ambulatory Visit | Attending: Internal Medicine | Admitting: Internal Medicine

## 2023-06-25 VITALS — BP 128/72 | HR 71 | Ht 67.0 in | Wt 146.8 lb

## 2023-06-25 DIAGNOSIS — I4891 Unspecified atrial fibrillation: Secondary | ICD-10-CM | POA: Diagnosis not present

## 2023-06-25 DIAGNOSIS — I480 Paroxysmal atrial fibrillation: Secondary | ICD-10-CM | POA: Insufficient documentation

## 2023-06-25 MED ORDER — LOSARTAN POTASSIUM 100 MG PO TABS: 100.0000 mg | ORAL_TABLET | Freq: Every day | ORAL | 3 refills | Status: DC

## 2023-06-25 NOTE — Telephone Encounter (Signed)
Called and spoke to patient  Informed patient new Rx for Losartan 100mg  daily sent to preferred pharmacy  Advised patient to keep 2/4 appointment for follow up  Patient verbalized understanding, no questions at this time

## 2023-06-25 NOTE — Progress Notes (Signed)
Primary Care Physician: [name], MD Referring Physician: Dr. Sharion Settler is a 82 y.o. male with a h/o of CAD, CVA that is in the afib clinic for startup of Eliquis early January. He had a Linq inserted last September after cryptogenic stroke and previous aphasia episodes x 2. He had afib that occurred for 2 hours 08/02/18 with HR's 88-167 bpm. Pt was completely unaware. He did have a hematoma that occurred left shoulder( prior surgeries) with plavix stopped, but still in his sytem and staring eliquis. His PCP reduced eliquis to 2.5 mg bid for 2 weeks then he resumed full anticoagulation. He has not had any further bleeding issues.  On follow up 06/25/23, he is currently in NSR. Seen in ED on 9/18 due to being in Afib with RVR at PCP office. He converted to NSR while there and recommended by Dr. Rennis Golden to take new metoprolol prescription sent by PCP. He also went to ED on 9/29 for elevated HR but apparently converted spontaneously while waiting and left AMA. He was seen by Cardiology on 05/18/23 and cardiac monitor was placed to assess burden. Patient concerned about bradycardia from Toprol so was transitioned to Lopressor 25 mg BID PRN. He is s/p Watchman procedure in May 2023; s/p ILR removal on 08/14/21. Cardiac monitor showed <1% Afib burden with longest episode lasting under 2 minutes.   Today, he denies symptoms of palpitations, chest pain, shortness of breath, orthopnea, PND, lower extremity edema, dizziness, presyncope, syncope, or neurologic sequela. The patient is tolerating medications without difficulties and is otherwise without complaint today.   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 Lalla Brothers   Spermatocele    left   Stroke [facility])    TIA's   Urgency of urination    Vertigo    Past Surgical History:  Procedure Laterality Date   BALLOON DILATION N/A 07/22/2022   Procedure: Varney Biles BALLOON DILATION;  Surgeon: Jerilee Field, MD;  Location: [facility];  Service: Urology;  Laterality: N/A;  60 MINS   CORONARY ANGIOPLASTY  1995  DR [place]   LAD   CYSTOSCOPY N/A 09/18/2015   Procedure: CYSTOSCOPY;  Surgeon: Jerilee Field, MD;  Location: [facility];  Service: Urology;  Laterality: N/A;   CYSTOSCOPY WITH RETROGRADE PYELOGRAM, URETEROSCOPY AND STENT PLACEMENT Left 03/01/2013   Procedure: CYSTOSCOPY WITH LEFT RETROGRADE PYELOGRAM, LEFT URETEROSCOPY and stent PLACEMENT;  Surgeon: Antony Haste, MD;  Location: [facility];  Service: Urology;  Laterality: Left;   EYE SURGERY Left    cataract surgery   FOOT FUSION Left    SECONDARY TO FX'S   GREEN LIGHT LASER TURP (TRANSURETHRAL RESECTION OF PROSTATE N/A 10/04/2013   Procedure: GREEN LIGHT LASER TURP (TRANSURETHRAL RESECTION OF PROSTATE;  Surgeon: Antony Haste, MD;  Location: [facility];  Service: Urology;  Laterality: N/A;   INGUINAL HERNIA REPAIR Bilateral    LEFT ATRIAL APPENDAGE OCCLUSION N/A 01/09/2022   Procedure: LEFT ATRIAL APPENDAGE OCCLUSION;  Surgeon: Lanier Prude, MD;  Location: [facility];  Service: Cardiovascular;  Laterality: N/A;   LOOP RECORDER INSERTION N/A 04/20/2018   Procedure: LOOP RECORDER INSERTION;  Surgeon: Marinus Maw, MD;  Location: [facility];  Service: Cardiovascular;  Laterality: N/A;   SHOULDER ARTHROSCOPY Right 08/18/2002   SHOULDER ARTHROSCOPY WITH DEBRIDEMENT AND BICEP TENDON REPAIR Left 04/28/2011   SPERMATOCELECTOMY Left  10/04/2013   Procedure: LEFT SPERMATOCELECTOMY;  Surgeon: Antony Haste, MD;  Location: [facility];  Service: Urology;  Laterality: Left;   TEE WITHOUT CARDIOVERSION N/A 02/16/2018   Procedure: TRANSESOPHAGEAL ECHOCARDIOGRAM (TEE);  Surgeon: Chrystie Nose, MD;  Location[facility];  Service: Cardiovascular;  Laterality: N/A;   TEE WITHOUT CARDIOVERSION N/A 01/09/2022   Procedure: TRANSESOPHAGEAL ECHOCARDIOGRAM (TEE);  Surgeon: Lanier Prude, MD;  Location: [facility] INVASIVE CV LAB;  Service: Cardiovascular;  Laterality: N/A;   TRANSURETHRAL RESECTION OF PROSTATE N/A 12/11/2014   Procedure: CYSTOSCOPY, CLOT EVACUATION, WITH FULGURATION ;  Surgeon: Bjorn Pippin, MD;  Location: [facility];  Service: Urology;  Laterality: N/A;   TRANSURETHRAL RESECTION OF PROSTATE N/A 09/18/2015   Procedure: TRANSURETHRAL RESECTION OF THE PROSTATE (TURP);  Surgeon: Jerilee Field, MD;  Location: [facility];  Service: Urology;  Laterality: N/A;   UMBILICAL HERNIA REPAIR  06/02/2008   UMBILICAL HERNIA REPAIR N/A 11/12/2021   Procedure: UMBILICAL HERNIA REPAIR;  Surgeon: Abigail Miyamoto, MD;  Location: [facility];  Service: General;  Laterality: N/A;  LMA    Current Outpatient Medications  Medication Sig Dispense Refill   acetaminophen (TYLENOL) 500 MG tablet Take 500 mg by mouth every 6 (six) hours as needed for moderate pain or mild pain.     aspirin EC 81 MG tablet Take 81 mg by mouth daily. Swallow whole.     Cholecalciferol (VITAMIN D3 PO) Take 1 tablet by mouth every other day.     Coenzyme Q10 (CO Q 10 PO) Take 300 mg by mouth daily at 12 noon.     Cyanocobalamin (VITAMIN B-12 PO) Take 1 tablet by mouth every other day.     MAGNESIUM CITRATE PO Take 250 mg by mouth every other day.     metoprolol tartrate (LOPRESSOR) 25 MG tablet Take 1 tab twice daily as needed only for heart rate greater than 100 beats per minute. 180 tablet 3   Multiple Vitamin (MULTIVITAMIN WITH MINERALS) TABS tablet Take 1 tablet by  mouth every other day.     rosuvastatin (CRESTOR) 20 MG tablet TAKE 1 TABLET BY MOUTH ONCE DAILY IN THE EVENING 90 tablet 3   tamsulosin (FLOMAX) 0.4 MG CAPS capsule Take 0.4 mg by mouth at bedtime.     VITAMIN E PO Take 1 tablet by mouth every other day.     budesonide-formoterol (SYMBICORT) 160-4.5 MCG/ACT inhaler Inhale 1 puff into the lungs as needed (chest tightness). (Patient not taking: Reported on 06/25/2023)     sacubitril-valsartan (ENTRESTO) 24-26 MG Take 1 tablet by mouth 2 (two) times daily. (Patient not taking: Reported on 06/25/2023) 60 tablet 11   No current facility-administered medications for this encounter.    Allergies  Allergen Reactions   Augmentin [Amoxicillin-Pot Clavulanate] Diarrhea   Codeine Nausea Only   Levaquin [Levofloxacin]     Other reaction(s): severe stomach pain   Zocor [Simvastatin]     Other reaction(s): myalgia  ROS- All systems are reviewed and negative except as per the HPI above  Physical Exam: Vitals:   06/25/23 0938  BP: 128/72  Pulse: 71  SpO2: 98%  Weight: 66.6 kg  Height: 5\' 7"  (1.702 m)    Wt Readings from Last 3 Encounters:  06/25/23 66.6 kg  06/23/23 67.6 kg  05/18/23 66.6 kg    Labs: Lab Results  Component Value Date   NA 138 05/17/2023   K 4.2 05/17/2023   CL 105 05/17/2023   CO2 25 05/17/2023   GLUCOSE 98 05/17/2023   BUN 19 05/17/2023   CREATININE 1.08 05/17/2023   CALCIUM 9.2 05/17/2023   MG 2.3 05/18/2023   Lab Results  Component Value Date   INR 1.01 01/14/2018   Lab Results  Component Value Date   CHOL 117 01/15/2018   HDL 41 01/15/2018   LDLCALC 63 01/15/2018   TRIG 64 01/15/2018   GEN- The patient is well appearing, alert and oriented x 3 today.   Neck - no JVD or carotid bruit noted Lungs- Clear to ausculation bilaterally, normal work of breathing Heart- Regular rate and rhythm, loud diastolic murmur noted; no rubs or gallops, PMI not laterally displaced Extremities- no clubbing, cyanosis,  or edema Skin - no rash or ecchymosis noted   EKG- Vent. rate 71 BPM PR interval 176 ms QRS duration 86 ms QT/QTcB 414/449 ms P-R-T axes 73 48 62 Sinus rhythm with Premature supraventricular complexes Otherwise normal ECG When compared with ECG of 18-May-2023 13:59, PREVIOUS ECG IS PRESENT  Echo 01/02/23: 1. Left ventricular ejection fraction, by estimation, is 40 to 45%. The  left ventricle has mildly decreased function. The left ventricle  demonstrates global hypokinesis. The left ventricular internal cavity size  was mildly dilated. Left ventricular  diastolic parameters are consistent with Grade II diastolic dysfunction  (pseudonormalization).   2. Right ventricular systolic function is normal. The right ventricular  size is normal.   3. Left atrial size was severely dilated.   4. No obvious residual ASD from Watchman procedure noted on TTE.   5. Right atrial size was moderately dilated.   6. The mitral valve is normal in structure. Mild mitral valve  regurgitation. No evidence of mitral stenosis.   7. There are several jets of AI overall moderate to severe. . The aortic  valve is tricuspid. 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 No  aortic stenosis is present. Aortic regurgitation PHT measures 376 msec.   8. Aortic dilatation noted. There is borderline dilatation of the aortic  root, measuring 38 mm.   9. The inferior vena cava is normal in size with greater than 50%  respiratory variability, suggesting right atrial pressure of 3 mmHg.    Epic records reviewed Paceart report reviewed   Assessment and Plan: 1. Paroxysmal afib asymptomatic, low afib burden S/p Watchman procedure May 2023.  He is currently in NSR. We reviewed monitor results regarding overall low Afib burden and reassurance provided. At this time, I advised patient we continue with conservative observation without medication changes. I did recommend rhythm monitoring device for home use.     F/u as scheduled with Dr. [place]. F/u Afib clinic prn.    Justin Mend, PA-C Afib Clinic [facility] [address] [place], [place] [address] [phone]

## 2023-08-26 ENCOUNTER — Ambulatory Visit (HOSPITAL_COMMUNITY): Payer: Medicare Other | Attending: Cardiology

## 2023-08-26 DIAGNOSIS — I480 Paroxysmal atrial fibrillation: Secondary | ICD-10-CM | POA: Insufficient documentation

## 2023-08-26 LAB — ECHOCARDIOGRAM COMPLETE
Area-P 1/2: 2.87 cm2
MV M vel: 4.59 m/s
MV Peak grad: 84.3 mm[Hg]
P 1/2 time: 344 ms
Radius: 0.6 cm
S' Lateral: 3.7 cm

## 2023-09-19 NOTE — Progress Notes (Signed)
 Cardiology Office Note Date:  09/22/2023   ID:  Maxtyn, Nuzum 1940-09-29, MRN [id number]  PCP:  Janey Santos, MD  Cardiologist:  Kyrstal Monterrosa MD  Chief Complaint  Patient presents with   aortic insufficiency   History of Present Illness: MOISHY LADAY is a 83 y.o. male who is seen for evaluation of CAD.   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  His evaluation included an echocardiogram, carotid ultrasound, CT and MRI studies of the brain.  All of these were unrevealing.  There was no evidence of cortical infarction on his neuroimaging studies.  He had no arrhythmia documented.  His  TEE demonstrated a small PFO. He was seen by Dr. Wonda for consideration of PFO closure but after discussion with Neurology this was not felt to be indication. He subsequently had an implantable loop recorder placed by Dr Waddell. He had a single  episode of AFib and he was started on Eliquis . 2 days after starting Eliquis  he developed a hematoma in his left shoulder. Eliquis  was reduced for 2 weeks then increase back to 5 mg bid. Plavix  was stoppped.   He has a history of CAD with remote angioplasty of the LAD in 1995.  He really has never had recurrent angina. He does have  a history of vertigo. Last loop recorder check on Dec 29 showed only one episode of Afib lasting minutes.  He was seen in January with transient confusion of unclear etiology. Carotid dopplers showed no significant stenosis. Seen by Dr Skeet. EEG on 10/15/2020 was normal.  MRI of brain without contrast on 10/30/2020  showed moderate  Cerebral chronic small vessel ischemic changes but no stroke or other acute intracranial abnormalities. We discussed his PFO but given uncertainty regarding diagnosis did not feel that closure of PFO was indicated.   He was then considered for a Watchman device in order not to have to be on anticoagulation. He was seen by Dr Cindie  and underwent left atrial appendage occlusive device placement with Watchman FLX 27mm in May.     On 05/17/23 he presented to the ED for elevated heart rate after mowing the lawn for about 1 hour, he noted increasing fatigue. He checked his pulse ox monitor and his heart rate was increased into the 120s.  Event monitor was done as OP. This showed Afib burden < 1%. NSR. Frequent PACs with multiple brief runs of SVT. Rare NSVT 5 beats.   More recent Echo 08/26/23 showed mod to severe AI but improved EF to 50-55%  He is seen today. He feels very well. No dyspnea or chest pain. No significant palpitations. Wasn't able to afford Entresto . No edema.     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 Cindie  Spermatocele    left   Stroke [facility])    TIA's   Urgency of urination    Vertigo     Past Surgical History:  Procedure Laterality Date   BALLOON DILATION N/A 07/22/2022   Procedure: OPTILUME BALLOON DILATION;  Surgeon: Nieves Cough, MD;  Location: [facility];  Service: Urology;  Laterality: N/A;  60 MINS   CORONARY ANGIOPLASTY  1995  DR Chapin Arduini   LAD   CYSTOSCOPY N/A 09/18/2015   Procedure: CYSTOSCOPY;  Surgeon: Cough Nieves, MD;  Location: [facility];  Service: Urology;  Laterality: N/A;   CYSTOSCOPY WITH RETROGRADE PYELOGRAM, URETEROSCOPY AND STENT PLACEMENT Left 03/01/2013   Procedure: CYSTOSCOPY WITH LEFT RETROGRADE PYELOGRAM, LEFT URETEROSCOPY and stent PLACEMENT;  Surgeon: Cough Gwenyth Nieves,  MD;  Location: [facility];  Service: Urology;  Laterality: Left;   EYE SURGERY Left    cataract surgery   FOOT FUSION Left    SECONDARY TO FX'S   GREEN LIGHT LASER TURP (TRANSURETHRAL RESECTION OF PROSTATE N/A 10/04/2013   Procedure: GREEN LIGHT LASER TURP (TRANSURETHRAL RESECTION OF PROSTATE;  Surgeon: Cough Gwenyth Nieves, MD;  Location: [facility];  Service: Urology;  Laterality: N/A;   INGUINAL HERNIA REPAIR Bilateral    LEFT ATRIAL APPENDAGE OCCLUSION N/A 01/09/2022   Procedure: LEFT ATRIAL APPENDAGE OCCLUSION;  Surgeon: Cindie Ole DASEN, MD;  Location: [facility];  Service: Cardiovascular;  Laterality: N/A;   LOOP RECORDER INSERTION N/A 04/20/2018   Procedure: LOOP RECORDER INSERTION;  Surgeon: Waddell Danelle ORN, MD;  Location: [facility];  Service: Cardiovascular;  Laterality: N/A;   SHOULDER ARTHROSCOPY Right 08/18/2002   SHOULDER ARTHROSCOPY WITH DEBRIDEMENT AND BICEP TENDON REPAIR Left 04/28/2011   SPERMATOCELECTOMY Left 10/04/2013   Procedure: LEFT SPERMATOCELECTOMY;  Surgeon: Cough Gwenyth Nieves, MD;  Location: [facility];  Service: Urology;  Laterality: Left;   TEE WITHOUT CARDIOVERSION N/A 02/16/2018   Procedure: TRANSESOPHAGEAL ECHOCARDIOGRAM (TEE);  Surgeon: Mona Vinie BROCKS, MD;  Location[facility];  Service: Cardiovascular;  Laterality: N/A;   TEE WITHOUT CARDIOVERSION N/A 01/09/2022   Procedure: TRANSESOPHAGEAL ECHOCARDIOGRAM (TEE);  Surgeon: Cindie Ole DASEN, MD;  Location: [facility] INV[facility]IVE CV LAB;  Service: Cardiovascular;  Laterality: N/A;   TRANSURETHRAL RESECTION OF PROSTATE N/A 12/11/2014   Procedure: CYSTOSCOPY, CLOT EVACUATION, WITH FULGURATION ;  Surgeon: Norleen Seltzer, MD;  Location: [facility];  Service: Urology;  Laterality: N/A;   TRANSURETHRAL RESECTION OF PROSTATE N/A 09/18/2015   Procedure: TRANSURETHRAL RESECTION OF THE PROSTATE (TURP);  Surgeon: Cough Nieves, MD;  Location: [facility];  Service: Urology;  Laterality: N/A;    UMBILICAL HERNIA REPAIR  06/02/2008   UMBILICAL HERNIA REPAIR N/A 11/12/2021   Procedure: UMBILICAL HERNIA REPAIR;  Surgeon: Vernetta Berg, MD;  Location: [facility];  Service: General;  Laterality: N/A;  LMA    Current Outpatient Medications  Medication Sig Dispense Refill   aspirin  EC 81 MG tablet Take 81 mg by mouth daily. Swallow whole.     Coenzyme Q10 (CO Q 10 PO) Take 300 mg by mouth daily at 12 noon.     Cyanocobalamin (VITAMIN B-12 PO) Take 1 tablet by mouth every other day.     losartan  (COZAAR ) 100 MG tablet Take 1 tablet (100 mg total) by mouth daily. 90 tablet 3   MAGNESIUM CITRATE PO Take 250 mg by mouth every other day.     metoprolol  tartrate (LOPRESSOR ) 25 MG tablet Take 1 tab twice daily as needed only for heart rate greater than 100 beats  per minute. 180 tablet 3   Multiple Vitamin (MULTIVITAMIN WITH MINERALS) TABS tablet Take 1 tablet by mouth every other day.     rosuvastatin  (CRESTOR ) 20 MG tablet TAKE 1 TABLET BY MOUTH ONCE DAILY IN THE EVENING 90 tablet 3   tamsulosin  (FLOMAX ) 0.4 MG CAPS capsule Take 0.4 mg by mouth at bedtime.     VITAMIN E PO Take 1 tablet by mouth every other day.     acetaminophen  (TYLENOL ) 500 MG tablet Take 500 mg by mouth every 6 (six) hours as needed for moderate pain or mild pain.     budesonide-formoterol (SYMBICORT) 160-4.5 MCG/ACT inhaler Inhale 1 puff into the lungs as needed (chest tightness). (Patient not taking: Reported on 06/25/2023)     No current facility-administered medications for this visit.    Allergies:   Augmentin [amoxicillin-pot clavulanate], Codeine, Levaquin [levofloxacin], and Zocor [simvastatin]   Social History:  The patient  reports that he quit smoking about 60 years ago. His smoking use included cigarettes. He started smoking about 63 years ago. He has a 1.5 pack-year smoking history. He has never used smokeless tobacco. He reports current alcohol use. He reports that he does not use drugs.   Family History:  The  patient's family history is not on file.    ROS:  Please see the history of present illness.  All other systems are reviewed and negative.    PHYSICAL EXAM: VS:  BP (!) 142/60   Pulse 73   Ht 5' 6 (1.676 m)   Wt 150 lb 6.4 oz (68.2 kg)   SpO2 97%   BMI 24.28 kg/m  , BMI Body mass index is 24.28 kg/m. GEN: Well nourished, well developed, in no acute distress  HEENT: normal  Neck: no JVD, no masses. No carotid bruits Cardiac: RRR  No gallop. There is a gr 2-3/6 holo-diastolic murmur LSB to apex.        Respiratory:  clear to auscultation bilaterally, normal work of breathing GI: soft, nontender, nondistended, + BS MS: no deformity or atrophy. There is a large mature bruise in the left shoulder and upper arm. Ext: no pretibial edema, pedal pulses 2+= bilaterally Skin: warm and dry, no rash Neuro:  Strength and sensation are intact Psych: euthymic mood, full affect  EKG:  EKG is not ordered today.      Recent Labs: 05/17/2023: BUN 19; Creatinine, Ser 1.08; Hemoglobin 11.0; Platelets 249; Potassium 4.2; Sodium 138 05/18/2023: Magnesium 2.3; TSH 1.600   Lipid Panel     Component Value Date/Time   CHOL 117 01/15/2018 0453   TRIG 64 01/15/2018 0453   HDL 41 01/15/2018 0453   CHOLHDL 2.9 01/15/2018 0453   VLDL 13 01/15/2018 0453   LDLCALC 63 01/15/2018 0453   Dated 04/09/18: cholesterol 134, triglycerides 87, HDL 45, LDL 72. CBC, chemistries, TSH normal. Dated 04/21/19: cholesterol 118, triglycerides 66, HDL 40, LDL 65. CMET, CBC, TSH normal Dated 04/25/20: cholesterol 120, triglycerides 46, HDL 44, LDL 67.  Dated 09/24/20: normal CMET Dated 02/24/22: cholesterol 111, triglycerides 79, HDL 47, LDL 49. CMET and TSH normal Dated 04/15/22: Hgb 11.2.  Dated 04/02/23: cholesterol 127, triglycerides 62, HDL 52, LDL 63.   Wt Readings from Last 3 Encounters:  09/22/23 150 lb 6.4 oz (68.2 kg)  06/25/23 146 lb 12.8 oz (66.6 kg)  06/23/23 149 lb (67.6 kg)     Cardiac Studies  Reviewed: TEE 02-16-2018: Study Conclusions   - Left ventricle: The cavity size was normal. There was  mild   concentric hypertrophy. Systolic function was normal. The   estimated ejection fraction was in the range of 55% to 60%. Wall   motion was normal; there were no regional wall motion   abnormalities. - Aortic valve: Trileaflet. Mild to moderate eccentric AI, 2 jets,   the larger jet is toward the anterior mitral valve leaflet. - Aorta: Dilated sinus of valsalva to 4.1 cm. - Left atrium: Mildly dilated. No evidence of thrombus in the   atrial cavity or appendage. - Right atrium: No evidence of thrombus in the atrial cavity or   appendage. - Atrial septum: There is a small PFO by saline microbubble   contrast which demonstrates intermittent right to left flow.   Impressions:   - Small PFO with intermittent right to left flow noted by saline   microbubble contrast. This can be a possible mechanism for his   TIA. Consider referral to Dr. Wonda with the structural heart   clinic for closure evaluation.  Echo 01/02/23:  IMPRESSIONS     1. Left ventricular ejection fraction, by estimation, is 40 to 45%. The  left ventricle has mildly decreased function. The left ventricle  demonstrates global hypokinesis. The left ventricular internal cavity size  was mildly dilated. Left ventricular  diastolic parameters are consistent with Grade II diastolic dysfunction  (pseudonormalization).   2. Right ventricular systolic function is normal. The right ventricular  size is normal.   3. Left atrial size was severely dilated.   4. No obvious residual ASD from Watchman procedure noted on TTE.   5. Right atrial size was moderately dilated.   6. The mitral valve is normal in structure. Mild mitral valve  regurgitation. No evidence of mitral stenosis.   7. There are several jets of AI overall moderate to severe. . The aortic  valve is tricuspid. 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  No  aortic stenosis is present. Aortic regurgitation PHT measures 376 msec.   8. Aortic dilatation noted. There is borderline dilatation of the aortic  root, measuring 38 mm.   9. The inferior vena cava is normal in size with greater than 50%  respiratory variability, suggesting right atrial pressure of 3 mmHg.   Event monitor 05/18/23: Patch Wear Time:  13 days and 23 hours (2024-10-03T06:14:39-0400 to 2024-10-17T06:14:28-0400)   Patient had a min HR of 32 bpm, max HR of 193 bpm, and avg HR of 71 bpm. Predominant underlying rhythm was Sinus Rhythm. 3 Ventricular Tachycardia runs occurred, the run with the fastest interval lasting 5 beats with a max rate of 187 bpm, the longest  lasting 6 beats with an avg rate of 147 bpm. 2472 Supraventricular Tachycardia runs occurred, the run with the fastest interval lasting 5 beats with a max rate of 193 bpm, the longest lasting 10.3 secs with an avg rate of 137 bpm. Some episodes of  Supraventricular Tachycardia may be possible Atrial Tachycardia with variable block. Atrial Fibrillation occurred (<1% burden), ranging from 116-175 bpm (avg of 145 bpm), the longest lasting 1 min 49 secs with an avg rate of 145 bpm. 1 episode(s) of AV  Block (2nd) occurred, lasting a total of 4 secs. Isolated SVEs were occasional (4.9%, [id number]), SVE Couplets were occasional (1.9%, 13301), and SVE Triplets were occasional (2.2%, 10195). Isolated VEs were rare (<1.0%, 13940), VE Couplets were rare  (<1.0%, 87), and VE Triplets were rare (<1.0%, 2). Ventricular Bigeminy and Trigeminy were present. Difficulty discerning atrial activity making definitive diagnosis difficult to ascertain.  Echo 08/26/23: IMPRESSIONS     1. Left ventricular ejection fraction, by estimation, is 50 to 55%. Left  ventricular ejection fraction by 3D volume is 54 %. The left ventricle has  low normal function. The left ventricle has no regional wall motion  abnormalities. Left ventricular  diastolic  parameters were normal.   2. Right ventricular systolic function is normal. The right ventricular  size is normal. Tricuspid regurgitation signal is inadequate for assessing  PA pressure.   3. Left atrial size was mildly dilated.   4. The mitral valve is normal in structure. Mild mitral valve  regurgitation.   5. The aortic valve is tricuspid. Aortic valve regurgitation is moderate  to severe. No aortic stenosis is present.   6. The inferior vena cava is normal in size with greater than 50%  respiratory variability, suggesting right atrial pressure of 3 mmHg.   Conclusion(s)/Recommendation(s): AI appears moderate to severe, recommend  cardiac MRI to quantify AI severity.   Assessment/Plan:  1. CAD remote PTCA of the LAD in 1995. No recurrent angina. Continue metoprolol , ASA and statin.  2. Paroxysmal Afib. Noted on loop recorder. Event monitor showed burden < 1% but does have some SVT. Has metoprolol  to take PRN for elevated HT. He is s/p Watchman LAO device so does not need anticoagulation. 3. HLD  on Crestor  4. HTN well controlled.  5. Transient confusion. Negative Neurologic evaluation.  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 Last Echo showed recovery of LV function.  He is asymptomatic. Continue losartan  and metoprolol .  Will follow up in 6 months. If clinically stable plan repeat Echo in one year.    Signed, Kiyana Vazguez, MD  09/22/2023 3:52 PM    [facility] [address] [place], [place], [place]  [address] Phone: [phone]; Fax: [phone]

## 2023-09-22 ENCOUNTER — Encounter: Payer: Self-pay | Admitting: Cardiology

## 2023-09-22 ENCOUNTER — Ambulatory Visit: Payer: Medicare Other | Attending: Cardiology | Admitting: Cardiology

## 2023-09-22 VITALS — BP 142/60 | HR 73 | Ht 66.0 in | Wt 150.4 lb

## 2023-09-22 DIAGNOSIS — I2510 Atherosclerotic heart disease of native coronary artery without angina pectoris: Secondary | ICD-10-CM | POA: Diagnosis present

## 2023-09-22 DIAGNOSIS — I480 Paroxysmal atrial fibrillation: Secondary | ICD-10-CM | POA: Diagnosis not present

## 2023-09-22 DIAGNOSIS — I10 Essential (primary) hypertension: Secondary | ICD-10-CM | POA: Insufficient documentation

## 2023-09-22 DIAGNOSIS — I351 Nonrheumatic aortic (valve) insufficiency: Secondary | ICD-10-CM | POA: Diagnosis not present

## 2023-09-22 NOTE — Patient Instructions (Signed)
 Medication Instructions:  Continue same medications *If you need a refill on your cardiac medications before your next appointment, please call your pharmacy*   Lab Work: None ordered   Testing/Procedures: None ordered   Follow-Up: At [facility], you and your health needs are our priority.  As part of our continuing mission to provide you with exceptional heart care, we have created designated Provider Care Teams.  These Care Teams include your primary Cardiologist (physician) and Advanced Practice Providers (APPs -  Physician Assistants and Nurse Practitioners) who all work together to provide you with the care you need, when you need it.  We recommend signing up for the patient portal called "MyChart".  Sign up information is provided on this After Visit Summary.  MyChart is used to connect with patients for Virtual Visits (Telemedicine).  Patients are able to view lab/test results, encounter notes, upcoming appointments, etc.  Non-urgent messages can be sent to your provider as well.   To learn more about what you can do with MyChart, go to [email or web address].    Your next appointment:  6 months    Call in May to schedule August appointment     Provider:  Dr.Jordan

## 2023-09-23 ENCOUNTER — Other Ambulatory Visit (HOSPITAL_COMMUNITY): Payer: Medicare Other

## 2024-01-28 ENCOUNTER — Telehealth: Payer: Self-pay

## 2024-01-28 NOTE — Telephone Encounter (Signed)
 Called to check in with patient, who had LAAO on 01/09/2022. The patient reports doing well with no issues.  Confirmed upcoming visit with Dr. [place] in August. He understands to call prior to that visit if he needs anything. He was grateful for call and agreed with plan.

## 2024-02-11 ENCOUNTER — Telehealth: Payer: Self-pay | Admitting: Cardiology

## 2024-02-11 MED ORDER — LOSARTAN POTASSIUM 100 MG PO TABS: 100.0000 mg | ORAL_TABLET | Freq: Every day | ORAL | 2 refills | Status: AC

## 2024-02-11 NOTE — Telephone Encounter (Signed)
*  STAT* If patient is at the pharmacy, call can be transferred to refill team.   1. Which medications need to be refilled? (please list name of each medication and dose if known) losartan  (COZAAR ) 100 MG tablet  2. Which pharmacy/location (including street and city if local pharmacy) is medication to be sent to? [facility] [address], [place] - [address]    3. Do they need a 30 day or 90 day supply?   90 day supply

## 2024-02-11 NOTE — Telephone Encounter (Signed)
 Pt's medication was sent to pt's pharmacy as requested. Confirmation received.

## 2024-03-25 NOTE — Progress Notes (Signed)
 Cardiology Office Note Date:  03/29/2024   ID:  Edward [name], DOB 05-Oct-1940, MRN [id number]  PCP:  Janey Santos, MD  Cardiologist:  Turrell Severt [place] MD  Chief Complaint  Patient presents with   Coronary Artery Disease   Atrial Fibrillation   History of Present Illness: Edward [name] is a 83 y.o. male who is seen for evaluation of CAD.   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  His evaluation included an echocardiogram, carotid ultrasound, CT and MRI studies of the brain.  All of these were unrevealing.  There was no evidence of cortical infarction on his neuroimaging studies.  He had no arrhythmia documented.  His  TEE demonstrated a small PFO. He was seen by Dr. Wonda for consideration of PFO closure but after discussion with Neurology this was not felt to be indication. He subsequently had an implantable loop recorder placed by Dr Waddell. He had a single  episode of AFib and he was started on Eliquis . 2 days after starting Eliquis  he developed a hematoma in his left shoulder. Eliquis  was reduced for 2 weeks then increase back to 5 mg bid. Plavix  was stoppped.   He has a history of CAD with remote angioplasty of the LAD in 1995.  He really has never had recurrent angina. He does have  a history of vertigo. Last loop recorder check on Dec 29 showed only one episode of Afib lasting minutes.  He was seen in January with transient confusion of unclear etiology. Carotid dopplers showed no significant stenosis. Seen by Dr Skeet. EEG on 10/15/2020 was normal.  MRI of brain without contrast on 10/30/2020  showed moderate  Cerebral chronic small vessel ischemic changes but no stroke or other acute intracranial abnormalities. We discussed his PFO but given uncertainty regarding diagnosis did not feel that closure of PFO was indicated.   He was then considered for a Watchman device in order not to have to be on  anticoagulation. He was seen by Dr Cindie and underwent left atrial appendage occlusive device placement with Watchman FLX 27mm.    On 05/17/23 he presented to the ED for elevated heart rate after mowing the lawn for about 1 hour, he noted increasing fatigue. He checked his pulse ox monitor and his heart rate was increased into the 120s.  Event monitor was done as OP. This showed Afib burden < 1%. NSR. Frequent PACs with multiple brief runs of SVT. Rare NSVT 5 beats.   More recent Echo 08/26/23 showed mod to severe AI but improved EF to 50-55%  He is seen today. He states he has no enthusiasm to do things. Has insomnia and frequent nocturia. Has had recurrent UTIs. No energy. BP has been running higher 150-155 systolic. No dyspnea or chest pain. No significant palpitations.     Past Medical History:  Diagnosis Date   Arthritis    right hand,  left foot   At risk for sleep apnea    STOP-BANG = 4    SENT TO PCP 02-10-215   BPH (benign prostatic hypertrophy)    Coronary artery disease CARIOLOGIST-- DR BLANCA   ANGIOPLASTY TO LAD   COVID 2022   mild case   Dysrhythmia    A-fib   Elevated PSA    Frequency of urination    History of kidney stones    Hyperlipidemia    Hypertension    Nocturia    Presence of  Watchman left atrial appendage closure device 01/09/2022   Watchman FLX 27mm performed by Dr. Cindie   Spermatocele    left   Stroke [facility])    TIA's   Urgency of urination    Vertigo     Past Surgical History:  Procedure Laterality Date   BALLOON DILATION N/A 07/22/2022   Procedure: ROSEBUD BALLOON DILATION;  Surgeon: Nieves Cough, MD;  Location: [facility];  Service: Urology;  Laterality: N/A;  60 MINS   CORONARY ANGIOPLASTY  1995  DR [name]   LAD   CYSTOSCOPY N/A 09/18/2015   Procedure: CYSTOSCOPY;  Surgeon: Cough Nieves, MD;  Location: [facility];  Service: Urology;  Laterality: N/A;   CYSTOSCOPY WITH RETROGRADE PYELOGRAM, URETEROSCOPY AND STENT PLACEMENT Left 03/01/2013    Procedure: CYSTOSCOPY WITH LEFT RETROGRADE PYELOGRAM, LEFT URETEROSCOPY and stent PLACEMENT;  Surgeon: Cough Gwenyth Nieves, MD;  Location[facility];  Service: Urology;  Laterality: Left;   EYE SURGERY Left    cataract surgery   FOOT FUSION Left    SECONDARY TO FX'S   GREEN LIGHT LASER TURP (TRANSURETHRAL RESECTION OF PROSTATE N/A 10/04/2013   Procedure: GREEN LIGHT LASER TURP (TRANSURETHRAL RESECTION OF PROSTATE;  Surgeon: Cough Gwenyth Nieves, MD;  Location: [facility];  Service: Urology;  Laterality: N/A;   INGUINAL HERNIA REPAIR Bilateral    LEFT ATRIAL APPENDAGE OCCLUSION N/A 01/09/2022   Procedure: LEFT ATRIAL APPENDAGE OCCLUSION;  Surgeon: Cindie Ole DASEN, MD;  Location: [facility];  Service: Cardiovascular;  Laterality: N/A;   LOOP RECORDER INSERTION N/A 04/20/2018   Procedure: LOOP RECORDER INSERTION;  Surgeon: Waddell Danelle ORN, MD;  Location: [facility]VAS[facility] CV LAB;  Service: Cardiovascular;  Laterality: N/A;   SHOULDER ARTHROSCOPY Right 08/18/2002   SHOULDER ARTHROSCOPY WITH DEBRIDEMENT AND BICEP TENDON REPAIR Left 04/28/2011   SPERMATOCELECTOMY Left 10/04/2013   Procedure: LEFT SPERMATOCELECTOMY;  Surgeon: Cough Gwenyth Nieves, MD;  Location: [facility];  Service: Urology;  Laterality: Left;   TEE WITHOUT CARDIOVERSION N/A 02/16/2018   Procedure: TRANSESOPHAGEAL ECHOCARDIOGRAM (TEE);  Surgeon: Mona Vinie BROCKS, MD;  Location: [facility] ENDOSCOPY;  Service: Cardiovascular;  Laterality: N/A;   TEE WITHOUT CARDIOVERSION N/A 01/09/2022   Procedure: TRANSESOPHAGEAL ECHOCARDIOGRAM (TEE);  Surgeon: Cindie Ole DASEN, MD;  [facility] INVASIVE CV LAB;  Service: Cardiovascular;  Laterality: N/A;   TRANSURETHRAL RESECTION OF PROSTATE N/A 12/11/2014   Procedure: CYSTOSCOPY, CLOT EVACUATION, WITH FULGURATION ;  Surgeon: Norleen Seltzer, MD;  Location: [facility];  Service: Urology;  Laterality: N/A;   TRANSURETHRAL RESECTION OF PROSTATE N/A 09/18/2015   Procedure: TRANSURETHRAL  RESECTION OF THE PROSTATE (TURP);  Surgeon: Cough Nieves, MD;  Location: [facility];  Service: Urology;  Laterality: N/A;   UMBILICAL HERNIA REPAIR  06/02/2008   UMBILICAL HERNIA REPAIR N/A 11/12/2021   Procedure: UMBILICAL HERNIA REPAIR;  Surgeon: Vernetta Berg, MD;  Location: [facility];  Service: General;  Laterality: N/A;  LMA    Current Outpatient Medications  Medication Sig Dispense Refill   acetaminophen  (TYLENOL ) 500 MG tablet Take 500 mg by mouth every 6 (six) hours as needed for moderate pain or mild pain.     amLODipine  (NORVASC ) 2.5 MG tablet Take 1 tablet (2.5 mg total) by mouth daily. 180 tablet 3   aspirin  EC 81 MG tablet Take 81 mg by mouth daily. Swallow whole.     budesonide-formoterol (SYMBICORT) 160-4.5 MCG/ACT inhaler Inhale 1 puff into the lungs as needed (chest tightness).     Coenzyme Q10 (CO Q 10 PO) Take 300  mg by mouth daily at 12 noon.     Cyanocobalamin (VITAMIN B-12 PO) Take 1 tablet by mouth every other day.     finasteride (PROSCAR) 5 MG tablet Take 5 mg by mouth daily.     losartan  (COZAAR ) 100 MG tablet Take 1 tablet (100 mg total) by mouth daily. 90 tablet 2   MAGNESIUM CITRATE PO Take 250 mg by mouth every other day.     metoprolol  tartrate (LOPRESSOR ) 25 MG tablet Take 1 tab twice daily as needed only for heart rate greater than 100 beats per minute. 180 tablet 3   Multiple Vitamin (MULTIVITAMIN WITH MINERALS) TABS tablet Take 1 tablet by mouth every other day.     rosuvastatin  (CRESTOR ) 20 MG tablet TAKE 1 TABLET BY MOUTH ONCE DAILY IN THE EVENING 90 tablet 3   tamsulosin  (FLOMAX ) 0.4 MG CAPS capsule Take 0.4 mg by mouth at bedtime.     VITAMIN E PO Take 1 tablet by mouth every other day.     No current facility-administered medications for this visit.    Allergies:   Augmentin [amoxicillin-pot clavulanate], Codeine, Levaquin [levofloxacin], and Zocor [simvastatin]   Social History:  The patient  reports that he quit smoking about 60 years ago. His  smoking use included cigarettes. He started smoking about 63 years ago. He has a 1.5 pack-year smoking history. He has never used smokeless tobacco. He reports current alcohol use. He reports that he does not use drugs.   Family History:  The patient's family history is not on file.    ROS:  Please see the history of present illness.  All other systems are reviewed and negative.    PHYSICAL EXAM: VS:  BP (!) 150/70 (BP Location: Left Arm, Patient Position: Sitting, Cuff Size: Normal)   Pulse 78   Ht 5' 6 (1.676 m)   Wt 150 lb 9.6 oz (68.3 kg)   SpO2 95%   BMI 24.31 kg/m  , BMI Body mass index is 24.31 kg/m. GEN: Well nourished, well developed, in no acute distress  HEENT: normal  Neck: no JVD, no masses. No carotid bruits Cardiac: RRR  No gallop. There is a gr 2-3/6 holo-diastolic murmur LSB to apex.        Respiratory:  clear to auscultation bilaterally, normal work of breathing GI: soft, nontender, nondistended, + BS MS: no deformity or atrophy. There is a large mature bruise in the left shoulder and upper arm. Ext: no pretibial edema, pedal pulses 2+= bilaterally Skin: warm and dry, no rash Neuro:  Strength and sensation are intact Psych: euthymic mood, full affect  EKG:  EKG is not ordered today.      Recent Labs: 05/17/2023: BUN 19; Creatinine, Ser 1.08; Hemoglobin 11.0; Platelets 249; Potassium 4.2; Sodium 138 05/18/2023: Magnesium 2.3; TSH 1.600   Lipid Panel     Component Value Date/Time   CHOL 117 01/15/2018 0453   TRIG 64 01/15/2018 0453   HDL 41 01/15/2018 0453   CHOLHDL 2.9 01/15/2018 0453   VLDL 13 01/15/2018 0453   LDLCALC 63 01/15/2018 0453   Dated 04/09/18: cholesterol 134, triglycerides 87, HDL 45, LDL 72. CBC, chemistries, TSH normal. Dated 04/21/19: cholesterol 118, triglycerides 66, HDL 40, LDL 65. CMET, CBC, TSH normal Dated 04/25/20: cholesterol 120, triglycerides 46, HDL 44, LDL 67.  Dated 09/24/20: normal CMET Dated 02/24/22: cholesterol 111,  triglycerides 79, HDL 47, LDL 49. CMET and TSH normal Dated 04/15/22: Hgb 11.2.  Dated 04/02/23: cholesterol 127, triglycerides 62, HDL  52, LDL 63.   Wt Readings from Last 3 Encounters:  03/29/24 150 lb 9.6 oz (68.3 kg)  09/22/23 150 lb 6.4 oz (68.2 kg)  06/25/23 146 lb 12.8 oz (66.6 kg)     Cardiac Studies Reviewed: TEE 02-16-2018: Study Conclusions   - Left ventricle: The cavity size was normal. There was mild   concentric hypertrophy. Systolic function was normal. The   estimated ejection fraction was in the range of 55% to 60%. Wall   motion was normal; there were no regional wall motion   abnormalities. - Aortic valve: Trileaflet. Mild to moderate eccentric AI, 2 jets,   the larger jet is toward the anterior mitral valve leaflet. - Aorta: Dilated sinus of valsalva to 4.1 cm. - Left atrium: Mildly dilated. No evidence of thrombus in the   atrial cavity or appendage. - Right atrium: No evidence of thrombus in the atrial cavity or   appendage. - Atrial septum: There is a small PFO by saline microbubble   contrast which demonstrates intermittent right to left flow.   Impressions:   - Small PFO with intermittent right to left flow noted by saline   microbubble contrast. This can be a possible mechanism for his   TIA. Consider referral to Dr. Wonda with the structural heart   clinic for closure evaluation.  Echo 01/02/23:  IMPRESSIONS     1. Left ventricular ejection fraction, by estimation, is 40 to 45%. The  left ventricle has mildly decreased function. The left ventricle  demonstrates global hypokinesis. The left ventricular internal cavity size  was mildly dilated. Left ventricular  diastolic parameters are consistent with Grade II diastolic dysfunction  (pseudonormalization).   2. Right ventricular systolic function is normal. The right ventricular  size is normal.   3. Left atrial size was severely dilated.   4. No obvious residual ASD from Watchman procedure noted  on TTE.   5. Right atrial size was moderately dilated.   6. The mitral valve is normal in structure. Mild mitral valve  regurgitation. No evidence of mitral stenosis.   7. There are several jets of AI overall moderate to severe. . The aortic  valve is tricuspid. 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 No  aortic stenosis is present. Aortic regurgitation PHT measures 376 msec.   8. Aortic dilatation noted. There is borderline dilatation of the aortic  root, measuring 38 mm.   9. The inferior vena cava is normal in size with greater than 50%  respiratory variability, suggesting right atrial pressure of 3 mmHg.   Event monitor 05/18/23: Patch Wear Time:  13 days and 23 hours (2024-10-03T06:14:39-0400 to 2024-10-17T06:14:28-0400)   Patient had a min HR of 32 bpm, max HR of 193 bpm, and avg HR of 71 bpm. Predominant underlying rhythm was Sinus Rhythm. 3 Ventricular Tachycardia runs occurred, the run with the fastest interval lasting 5 beats with a max rate of 187 bpm, the longest  lasting 6 beats with an avg rate of 147 bpm. 2472 Supraventricular Tachycardia runs occurred, the run with the fastest interval lasting 5 beats with a max rate of 193 bpm, the longest lasting 10.3 secs with an avg rate of 137 bpm. Some episodes of  Supraventricular Tachycardia may be possible Atrial Tachycardia with variable block. Atrial Fibrillation occurred (<1% burden), ranging from 116-175 bpm (avg of 145 bpm), the longest lasting 1 min 49 secs with an avg rate of 145 bpm. 1 episode(s) of AV  Block (2nd) occurred, lasting a total  of 4 secs. Isolated SVEs were occasional (4.9%, [id number]), SVE Couplets were occasional (1.9%, 13301), and SVE Triplets were occasional (2.2%, 10195). Isolated VEs were rare (<1.0%, 13940), VE Couplets were rare  (<1.0%, 87), and VE Triplets were rare (<1.0%, 2). Ventricular Bigeminy and Trigeminy were present. Difficulty discerning atrial activity making definitive diagnosis difficult to  ascertain.  Echo 08/26/23: IMPRESSIONS     1. Left ventricular ejection fraction, by estimation, is 50 to 55%. Left  ventricular ejection fraction by 3D volume is 54 %. The left ventricle has  low normal function. The left ventricle has no regional wall motion  abnormalities. Left ventricular  diastolic parameters were normal.   2. Right ventricular systolic function is normal. The right ventricular  size is normal. Tricuspid regurgitation signal is inadequate for assessing  PA pressure.   3. Left atrial size was mildly dilated.   4. The mitral valve is normal in structure. Mild mitral valve  regurgitation.   5. The aortic valve is tricuspid. Aortic valve regurgitation is moderate  to severe. No aortic stenosis is present.   6. The inferior vena cava is normal in size with greater than 50%  respiratory variability, suggesting right atrial pressure of 3 mmHg.   Conclusion(s)/Recommendation(s): AI appears moderate to severe, recommend  cardiac MRI to quantify AI severity.   Assessment/Plan:  1. CAD remote PTCA of the LAD in 1995. No recurrent angina. Continue metoprolol , ASA and statin.  2. Paroxysmal Afib. Noted on loop recorder. Event monitor showed burden < 1% but does have some SVT. Has metoprolol  to take PRN for elevated HT. He is s/p Watchman LAO device so does not need anticoagulation. 3. HLD  on Crestor . Had labs drawn with Dr Janey tody.  4. HTN BP is high. On maximal losartan  dose. Will add amlodipine  2.5 mg daily  5. Transient confusion. Negative Neurologic evaluation.  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 Last Echo showed improvement of LV function.  He is asymptomatic. Continue losartan . Add amlodipine   Will follow up in 6 months.    Signed, Edward Murthy [place], MD  03/29/2024 3:31 PM    [facility] [address] [place], [place], [place]  [address] Phone: [phone]; Fax: [phone]

## 2024-03-29 ENCOUNTER — Encounter: Payer: Self-pay | Admitting: Cardiology

## 2024-03-29 ENCOUNTER — Ambulatory Visit: Attending: Cardiology | Admitting: Cardiology

## 2024-03-29 VITALS — BP 150/70 | HR 78 | Ht 66.0 in | Wt 150.6 lb

## 2024-03-29 DIAGNOSIS — I351 Nonrheumatic aortic (valve) insufficiency: Secondary | ICD-10-CM | POA: Insufficient documentation

## 2024-03-29 DIAGNOSIS — I2510 Atherosclerotic heart disease of native coronary artery without angina pectoris: Secondary | ICD-10-CM | POA: Diagnosis present

## 2024-03-29 DIAGNOSIS — I480 Paroxysmal atrial fibrillation: Secondary | ICD-10-CM | POA: Diagnosis not present

## 2024-03-29 DIAGNOSIS — I10 Essential (primary) hypertension: Secondary | ICD-10-CM | POA: Insufficient documentation

## 2024-03-29 DIAGNOSIS — E7800 Pure hypercholesterolemia, unspecified: Secondary | ICD-10-CM | POA: Diagnosis present

## 2024-03-29 MED ORDER — AMLODIPINE BESYLATE 2.5 MG PO TABS: 2.5000 mg | ORAL_TABLET | Freq: Every day | ORAL | 3 refills | Status: DC

## 2024-03-29 NOTE — Patient Instructions (Signed)
 Medication Instructions:  Start Amlodipine  2.5 mg daily  Continue all other medications *If you need a refill on your cardiac medications before your next appointment, please call your pharmacy*  Lab Work: None ordered  Testing/Procedures: None ordered  Follow-Up: At [facility], you and your health needs are our priority.  As part of our continuing mission to provide you with exceptional heart care, our providers are all part of one team.  This team includes your primary Cardiologist (physician) and Advanced Practice Providers or APPs (Physician Assistants and Nurse Practitioners) who all work together to provide you with the care you need, when you need it.  Your next appointment:  6 months  Call in Oct to schedule Feb appointment     Provider:  Dr.Jordan   We recommend signing up for the patient portal called MyChart.  Sign up information is provided on this After Visit Summary.  MyChart is used to connect with patients for Virtual Visits (Telemedicine).  Patients are able to view lab/test results, encounter notes, upcoming appointments, etc.  Non-urgent messages can be sent to your provider as well.   To learn more about what you can do with MyChart, go to [email or web address].

## 2024-04-15 ENCOUNTER — Telehealth: Payer: Self-pay | Admitting: Cardiology

## 2024-04-15 MED ORDER — ROSUVASTATIN CALCIUM 20 MG PO TABS: 20.0000 mg | ORAL_TABLET | Freq: Every evening | ORAL | 3 refills | Status: AC

## 2024-04-15 NOTE — Telephone Encounter (Signed)
*  STAT* If patient is at the pharmacy, call can be transferred to refill team.   1. Which medications need to be refilled? (please list name of each medication and dose if known)   rosuvastatin  (CRESTOR ) 20 MG tablet     4. Which pharmacy/location (including street and city if local pharmacy) is medication to be sent to?  [facility] - [place], [place] - [address]     5. Do they need a 30 day or 90 day supply? 90   Pt completely out

## 2024-04-15 NOTE — Telephone Encounter (Signed)
 Pt's medication was sent to pt's pharmacy as requested. Confirmation received.

## 2024-07-12 ENCOUNTER — Other Ambulatory Visit: Payer: Self-pay | Admitting: Urology

## 2024-07-20 ENCOUNTER — Encounter (HOSPITAL_COMMUNITY): Payer: Self-pay

## 2024-07-26 NOTE — Patient Instructions (Signed)
 SURGICAL WAITING ROOM VISITATION Patients having surgery or a procedure may have no more than 2 support people in the waiting area - these visitors may rotate in the visitor waiting room.   Due to an increase in RSV and influenza rates and associated hospitalizations, children ages 44 and under may not visit patients in [facility] hospitals. If the patient needs to stay at the hospital during part of their recovery, the visitor guidelines for inpatient rooms apply.  PRE-OP VISITATION  Pre-op nurse will coordinate an appropriate time for 1 support person to accompany the patient in pre-op.  This support person may not rotate.  This visitor will be contacted when the time is appropriate for the visitor to come back in the pre-op area.  Please refer to the [facility] website for the visitor guidelines for Inpatients (after your surgery is over and you are in a regular room).  You are not required to quarantine at this time prior to your surgery. However, you must do this: Hand Hygiene often Do NOT share personal items Notify your provider if you are in close contact with someone who has COVID or you develop fever 100.4 or greater, new onset of sneezing, cough, sore throat, shortness of breath or body aches.  If you test positive for Covid or have been in contact with anyone that has tested positive in the last 10 days please notify you surgeon.    Your procedure is scheduled on: 08/05/24   Report to [facility] Main Entrance: [place] entrance where the [employer] is available.   Report to admitting at: 6:45 AM  Call this number if you have any questions or problems the morning of surgery [phone]  FOLLOW ANY ADDITIONAL PRE OP INSTRUCTIONS YOU RECEIVED FROM YOUR SURGEON'S OFFICE!!!  Do not eat food or drink fluids after Midnight the night prior to your surgery/procedure.   Oral Hygiene is also important to reduce your risk of infection.        Remember - BRUSH YOUR TEETH  THE MORNING OF SURGERY WITH YOUR REGULAR TOOTHPASTE  Do NOT smoke after Midnight the night before surgery.  STOP TAKING all Vitamins, Herbs and supplements 1 week before your surgery.   Take ONLY these medicines the morning of surgery with A SIP OF WATER : lexapro,amlodipine .Use inhalers as usual and bring them.Tylenol ,metoprolol  as needed.                   You may not have any metal on your body including hair pins, jewelry, and body piercing  Do not wear lotions, powders, perfumes / cologne, or deodorant  Men may shave face and neck.  Contacts, Hearing Aids, dentures or bridgework may not be worn into surgery. DENTURES WILL BE REMOVED PRIOR TO SURGERY PLEASE DO NOT APPLY Poly grip OR ADHESIVES!!!  You may bring a small overnight bag with you on the day of surgery, only pack items that are not valuable. [facility] IS NOT RESPONSIBLE   FOR VALUABLES THAT ARE LOST OR STOLEN.   Patients discharged on the day of surgery will not be allowed to drive home.  Someone NEEDS to stay with you for the first 24 hours after anesthesia.  Do not bring your home medications to the hospital. The Pharmacy will dispense medications listed on your medication list to you during your admission in the Hospital.  Special Instructions: Bring a copy of your healthcare power of attorney and living will documents the day of surgery, if you wish  to have them scanned into your [facility] Medical Records- EPIC  Please read over the following fact sheets you were given: IF YOU HAVE QUESTIONS ABOUT YOUR PRE-OP INSTRUCTIONS, PLEASE CALL [phone]   [facility] - Preparing for Surgery      Before surgery, you can play an important role.  Because skin is not sterile, your skin needs to be as free of germs as possible.  You can reduce the number of germs on your skin by washing with CHG (chlorahexidine gluconate) soap before surgery.  CHG is an antiseptic cleaner which kills germs and bonds with the skin to continue  killing germs even after washing. Please DO NOT use if you have an allergy to CHG or antibacterial soaps.  If your skin becomes reddened/irritated stop using the CHG and inform your nurse when you arrive at Short Stay. Do not shave (including legs and underarms) for at least 48 hours prior to the first CHG shower.  You may shave your face/neck.  Please follow these instructions carefully:  1.  Shower with CHG Soap the night before surgery ONLY (DO NOT USE THE SOAP THE MORNING OF SURGERY).  2.  If you choose to wash your hair, wash your hair first as usual with your normal  shampoo.  3.  After you shampoo, rinse your hair and body thoroughly to remove the shampoo.                             4.  Use CHG as you would any other liquid soap.  You can apply chg directly to the skin and wash.  Gently with a scrungie or clean washcloth.  5.  Apply the CHG Soap to your body ONLY FROM THE NECK DOWN.   Do not use on face/ open                           Wound or open sores. Avoid contact with eyes, ears mouth and genitals (private parts).                       Wash face,  Genitals (private parts) with your normal soap.             6.  Wash thoroughly, paying special attention to the area where your  surgery  will be performed.  7.  Thoroughly rinse your body with warm water  from the neck down.  8.  DO NOT shower/wash with your normal soap after using and rinsing off the CHG Soap.                9.  Pat yourself dry with a clean towel.            10.  Wear clean pajamas.            11.  Place clean sheets on your bed the night of your first shower and do not  sleep with pets.  Day of Surgery : Do not apply any CHG, lotions/deodorants the morning of surgery.  Please wear clean clothes to the hospital/surgery center.   FAILURE TO FOLLOW THESE INSTRUCTIONS MAY RESULT IN THE CANCELLATION OF YOUR SURGERY  PATIENT SIGNATURE_________________________________  NURSE  SIGNATURE__________________________________  ________________________________________________________________________

## 2024-07-27 ENCOUNTER — Encounter (HOSPITAL_COMMUNITY)
Admission: RE | Admit: 2024-07-27 | Discharge: 2024-07-27 | Disposition: A | Source: Ambulatory Visit | Attending: Urology

## 2024-07-27 ENCOUNTER — Encounter (HOSPITAL_COMMUNITY): Payer: Self-pay

## 2024-07-27 ENCOUNTER — Other Ambulatory Visit: Payer: Self-pay

## 2024-07-27 VITALS — BP 151/72 | HR 74 | Temp 97.8°F | Ht 66.0 in | Wt 148.0 lb

## 2024-07-27 DIAGNOSIS — Z01818 Encounter for other preprocedural examination: Secondary | ICD-10-CM | POA: Insufficient documentation

## 2024-07-27 DIAGNOSIS — Z8673 Personal history of transient ischemic attack (TIA), and cerebral infarction without residual deficits: Secondary | ICD-10-CM | POA: Insufficient documentation

## 2024-07-27 DIAGNOSIS — Z87891 Personal history of nicotine dependence: Secondary | ICD-10-CM | POA: Insufficient documentation

## 2024-07-27 DIAGNOSIS — I480 Paroxysmal atrial fibrillation: Secondary | ICD-10-CM | POA: Insufficient documentation

## 2024-07-27 DIAGNOSIS — Z955 Presence of coronary angioplasty implant and graft: Secondary | ICD-10-CM | POA: Insufficient documentation

## 2024-07-27 DIAGNOSIS — N200 Calculus of kidney: Secondary | ICD-10-CM | POA: Insufficient documentation

## 2024-07-27 DIAGNOSIS — I10 Essential (primary) hypertension: Secondary | ICD-10-CM | POA: Insufficient documentation

## 2024-07-27 DIAGNOSIS — I351 Nonrheumatic aortic (valve) insufficiency: Secondary | ICD-10-CM | POA: Insufficient documentation

## 2024-07-27 DIAGNOSIS — D649 Anemia, unspecified: Secondary | ICD-10-CM | POA: Insufficient documentation

## 2024-07-27 DIAGNOSIS — I2510 Atherosclerotic heart disease of native coronary artery without angina pectoris: Secondary | ICD-10-CM | POA: Insufficient documentation

## 2024-07-27 LAB — CBC
HCT: 38.2 % — ABNORMAL LOW (ref 39.0–52.0)
Hemoglobin: 12.4 g/dL — ABNORMAL LOW (ref 13.0–17.0)
MCH: 31.2 pg (ref 26.0–34.0)
MCHC: 32.5 g/dL (ref 30.0–36.0)
MCV: 96.2 fL (ref 80.0–100.0)
Platelets: 170 K/uL (ref 150–400)
RBC: 3.97 MIL/uL — ABNORMAL LOW (ref 4.22–5.81)
RDW: 13.2 % (ref 11.5–15.5)
WBC: 9.7 K/uL (ref 4.0–10.5)
nRBC: 0 % (ref 0.0–0.2)

## 2024-07-27 LAB — BASIC METABOLIC PANEL WITH GFR
Anion gap: 11 (ref 5–15)
BUN: 21 mg/dL (ref 8–23)
CO2: 23 mmol/L (ref 22–32)
Calcium: 9.8 mg/dL (ref 8.9–10.3)
Chloride: 103 mmol/L (ref 98–111)
Creatinine, Ser: 1.44 mg/dL — ABNORMAL HIGH (ref 0.61–1.24)
GFR, Estimated: 49 mL/min — ABNORMAL LOW (ref >60)
Glucose, Bld: 97 mg/dL (ref 70–99)
Potassium: 4.9 mmol/L (ref 3.5–5.1)
Sodium: 136 mmol/L (ref 135–145)

## 2024-07-27 NOTE — Progress Notes (Addendum)
 For Anesthesia: PCP - Janey Santos, MD  Cardiologist - Jordan, Peter M, MD LOV: 03/29/24  Bowel Prep reminder:  Chest x-ray -  EKG - 07/27/24 Stress Test -  ECHO - 08/26/23 Cardiac Cath -  Pacemaker/ICD device last checked: Pacemaker orders received: Device Rep notified:  Spinal Cord Stimulator:N/A  Sleep Study - N/A CPAP -   Fasting Blood Sugar - N/A Checks Blood Sugar _____ times a day Date and result of last Hgb A1c-  Last dose of GLP1 agonist- N/A GLP1 instructions: Hold 7 days prior to schedule (Hold 24 hours-daily)   Last dose of SGLT-2 inhibitors- N/A SGLT-2 instructions: Hold 72 hours prior to surgery  Blood Thinner Instructions:N/A Last Dose: Time last taken:  Aspirin  Instructions:To hold 5 days before surgery. Last Dose: Time last taken:  Activity level: Can go up a flight of stairs and activities of daily living without stopping and without chest pain and/or shortness of breath   Able to exercise without chest pain and/or shortness of breath  Anesthesia review: Hx: CAD,Afib,HTN,Stroke,Presence of Watchman left atrial appendage closure device   Patient denies shortness of breath, fever, cough and chest pain at PAT appointment   Patient verbalized understanding of instructions that were reviewed over the telephone.

## 2024-07-28 ENCOUNTER — Encounter (HOSPITAL_COMMUNITY): Payer: Self-pay

## 2024-07-28 NOTE — Anesthesia Preprocedure Evaluation (Signed)
 Anesthesia Evaluation    Reviewed: Allergy & Precautions, Patient's Chart, lab work & pertinent test results  Airway        Dental   Pulmonary neg pulmonary ROS, former smoker          Cardiovascular hypertension, + CAD  + dysrhythmias Atrial Fibrillation + Valvular Problems/Murmurs (mod/severe AI) AI   Echo 08/26/23:   IMPRESSIONS      1. Left ventricular ejection fraction, by estimation, is 50 to 55%. Left ventricular ejection fraction by 3D volume is 54 %. The left ventricle has low normal function. The left ventricle has no regional wall motion abnormalities. Left ventricular diastolic parameters were normal.  2. Right ventricular systolic function is normal. The right ventricular size is normal. Tricuspid regurgitation signal is inadequate for assessing PA pressure.  3. Left atrial size was mildly dilated.  4. The mitral valve is normal in structure. Mild mitral valve regurgitation.  5. The aortic valve is tricuspid. 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 No aortic stenosis is present.  6. The inferior vena cava is normal in size with greater than 50% respiratory variability, suggesting right atrial pressure of 3 mmHg.   Conclusion(s)/Recommendation(s): AI appears moderate to severe, recommend cardiac MRI to quantify AI severity.    Neuro/Psych TIACVA  negative psych ROS   GI/Hepatic negative GI ROS, Neg liver ROS,,,  Endo/Other  negative endocrine ROS    Renal/GU Renal disease  negative genitourinary   Musculoskeletal  (+) Arthritis ,    Abdominal   Peds  Hematology negative hematology ROS (+)   Anesthesia Other Findings PMH of former smoking, HTN, CAD s/p PCI to LAD (1995), moderate-severe AI, A.fib s/p watchman's device (12/2021), hx of TIA, chronic anemia.  Reproductive/Obstetrics                              Anesthesia Physical Anesthesia Plan  ASA:  3  Anesthesia Plan: General   Post-op Pain Management: Tylenol  PO (pre-op)*   Induction: Intravenous  PONV Risk Score and Plan: 2 and Ondansetron , Dexamethasone  and Treatment may vary due to age or medical condition  Airway Management Planned: LMA  Additional Equipment:   Intra-op Plan:   Post-operative Plan: Extubation in OR  Informed Consent: I have reviewed the patients History and Physical, chart, labs and discussed the procedure including the risks, benefits and alternatives for the proposed anesthesia with the patient or authorized representative who has indicated his/her understanding and acceptance.     Dental advisory given  Plan Discussed with: CRNA  Anesthesia Plan Comments: (See PAT note from 12/10)         Anesthesia Quick Evaluation

## 2024-07-28 NOTE — Progress Notes (Signed)
 Case: [id number] Date/Time: 08/05/24 0845   Procedure: CYSTOSCOPY/URETEROSCOPY/HOLMIUM LASER/STENT PLACEMENT (Bilateral)   Anesthesia type: General   Diagnosis: Calculus of kidney [N20.0]   Pre-op diagnosis: BILATERAL KINDNEY STONES   Location: [facility]   Surgeons: Nieves Cough, MD       DISCUSSION: Edward [name] is an 83 yo male with PMH of former smoking, HTN, CAD s/p PCI to LAD (1995), moderate-severe AI, A.fib s/p watchman's device (12/2021), hx of TIA, chronic anemia.  Patient follows with Cardiology for above hx. Has remote angioplasty to LAD in 1995. 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 He had serious bleeding on Eliquis  so was referred to have Watchman's device which was implanted in 2023. Last seen in clinic on 03/29/24 by Dr. Jordan. He reported feeling fatigued and having insomnia but no cardiac symptoms. He was advised to continue medical therapy. Amlodipine  was added for high BP. He has known moderate to severe AI on last echo in Jan 2025 with normal LVEF and was advised to f/u in 6 months since he is asymptomatic.   At PAT visit he reports he can do stairs without chest pain or SOB. Anticipate he can proceed.  VS: BP (!) 151/72   Pulse 74   Temp 36.6 C (Oral)   Ht 5' 6 (1.676 m)   Wt 67.1 kg   SpO2 100%   BMI 23.89 kg/m   PROVIDERS: Avva, Ravisankar, MD   LABS: Labs reviewed: Acceptable for surgery. Mild AKI noted (all labs ordered are listed, but only abnormal results are displayed)  Labs Reviewed  BASIC METABOLIC PANEL WITH GFR - Abnormal; Notable for the following components:      Result Value   Creatinine, Ser 1.44 (*)    GFR, Estimated 49 (*)    All other components within normal limits  CBC - Abnormal; Notable for the following components:   RBC 3.97 (*)    Hemoglobin 12.4 (*)    HCT 38.2 (*)    All other components within normal limits     IMAGES:   EKG  07/27/24:  NSR  Echo 08/26/23:  IMPRESSIONS    1. Left ventricular ejection fraction, by estimation, is 50 to 55%. Left ventricular ejection fraction by 3D volume is 54 %. The left ventricle has low normal function. The left ventricle has no regional wall motion abnormalities. Left ventricular diastolic parameters were normal.  2. Right ventricular systolic function is normal. The right ventricular size is normal. Tricuspid regurgitation signal is inadequate for assessing PA pressure.  3. Left atrial size was mildly dilated.  4. The mitral valve is normal in structure. Mild mitral valve regurgitation.  5. The aortic valve is tricuspid. 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 No aortic stenosis is present.  6. The inferior vena cava is normal in size with greater than 50% respiratory variability, suggesting right atrial pressure of 3 mmHg.  Conclusion(s)/Recommendation(s): AI appears moderate to severe, recommend cardiac MRI to quantify AI severity.  Event monitor 06/11/23:    Normal sinus rhythm   Rare PVCs with 3 runs of NSVT- longest 6 beats   Occasional PACs. frequent brief runs of PAT. longest lasting 10.3 seconds.   Paroxysmal Afib with RVR with burden < 1%. longest episode 1 minute 49 seconds     Patch Wear Time:  13 days and 23 hours (2024-10-03T06:14:39-0400 to 2024-10-17T06:14:28-0400)  Cardiac CT 03/10/2022:  IMPRESSION: 1. There is normal pulmonary  vein drainage into the left atrium.   2. There is no peri-device leak and there is suitable compression of the Watchman FLX device.   3. There is delayed endothelialization (incomplete endothelialization). Anticoagulation strategy as per heart team approach.   4. Coronary calcium  score of 528.  Past Medical History:  Diagnosis Date   Arthritis    right hand,  left foot   At risk for sleep apnea    STOP-BANG = 4    SENT TO PCP 02-10-215   BPH (benign prostatic hypertrophy)    Coronary artery  disease CARIOLOGIST-- DR BLANCA   ANGIOPLASTY TO LAD   COVID 2022   mild case   Dysrhythmia    A-fib   Elevated PSA    Frequency of urination    History of kidney stones    Hyperlipidemia    Hypertension    Nocturia    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 Cindie   Spermatocele    left   Stroke [facility])    TIA's   Urgency of urination    Vertigo     Past Surgical History:  Procedure Laterality Date   BALLOON DILATION N/A 07/22/2022   Procedure: ROSEBUD BALLOON DILATION;  Surgeon: Nieves Cough, MD;  Location[facility];  Service: Urology;  Laterality: N/A;  60 MINS   CORONARY ANGIOPLASTY  1995  DR JORDAN   LAD   CYSTOSCOPY N/A 09/18/2015   Procedure: CYSTOSCOPY;  Surgeon: Cough Nieves, MD;  Location: [facility];  Service: Urology;  Laterality: N/A;   CYSTOSCOPY WITH RETROGRADE PYELOGRAM, URETEROSCOPY AND STENT PLACEMENT Left 03/01/2013   Procedure: CYSTOSCOPY WITH LEFT RETROGRADE PYELOGRAM, LEFT URETEROSCOPY and stent PLACEMENT;  Surgeon: Cough Gwenyth Nieves, MD;  Location: [facility];  Service: Urology;  Laterality: Left;   EYE SURGERY Left    cataract surgery   FOOT FUSION Left    SECONDARY TO FX'S   GREEN LIGHT LASER TURP (TRANSURETHRAL RESECTION OF PROSTATE N/A 10/04/2013   Procedure: GREEN LIGHT LASER TURP (TRANSURETHRAL RESECTION OF PROSTATE;  Surgeon: Cough Gwenyth Nieves, MD;  Location[facility];  Service: Urology;  Laterality: N/A;   INGUINAL HERNIA REPAIR Bilateral    LEFT ATRIAL APPENDAGE OCCLUSION N/A 01/09/2022   Procedure: LEFT ATRIAL APPENDAGE OCCLUSION;  Surgeon: Cindie Ole DASEN, MD;  Location: [facility];  Service: Cardiovascular;  Laterality: N/A;   LOOP RECORDER INSERTION N/A 04/20/2018   Procedure: LOOP RECORDER INSERTION;  Surgeon: Waddell Danelle ORN, MD;  Location: [facility];  Service: Cardiovascular;  Laterality: N/A;   SHOULDER ARTHROSCOPY Right  08/18/2002   SHOULDER ARTHROSCOPY WITH DEBRIDEMENT AND BICEP TENDON REPAIR Left 04/28/2011   SPERMATOCELECTOMY Left 10/04/2013   Procedure: LEFT SPERMATOCELECTOMY;  Surgeon: Cough Gwenyth Nieves, MD;  [facility];  Service: Urology;  Laterality: Left;   TEE WITHOUT CARDIOVERSION N/A 02/16/2018   Procedure: TRANSESOPHAGEAL ECHOCARDIOGRAM (TEE);  Surgeon: Mona Vinie BROCKS, MD;  Location: [facility] ENDOSCOPY;  Service: Cardiovascular;  Laterality: N/A;   TEE WITHOUT CARDIOVERSION N/A 01/09/2022   Procedure: TRANSESOPHAGEAL ECHOCARDIOGRAM (TEE);  Surgeon: Cindie Ole DASEN, MD;  Location: [facility] INVASIVE CV LAB;  Service: Cardiovascular;  Laterality: N/A;   TRANSURETHRAL RESECTION OF PROSTATE N/A 12/11/2014   Procedure: CYSTOSCOPY, CLOT EVACUATION, WITH FULGURATION ;  Surgeon: Norleen Seltzer, MD;  Location[facility];  Service: Urology;  Laterality: N/A;   TRANSURETHRAL RESECTION OF PROSTATE N/A 09/18/2015   Procedure: TRANSURETHRAL RESECTION OF THE PROSTATE (  TURP);  Surgeon: [name], MD;  Location[facility];  Service: Urology;  Laterality: N/A;   UMBILICAL HERNIA REPAIR  06/02/2008   UMBILICAL HERNIA REPAIR N/A 11/12/2021   Procedure: UMBILICAL HERNIA REPAIR;  Surgeon: Vernetta Berg, MD;  Location: [facility];  Service: General;  Laterality: N/A;  LMA    MEDICATIONS:  acetaminophen  (TYLENOL ) 500 MG tablet   amLODipine  (NORVASC ) 2.5 MG tablet   aspirin  EC 81 MG tablet   budesonide-formoterol (SYMBICORT) 160-4.5 MCG/ACT inhaler   Coenzyme Q10 (CO Q 10 PO)   escitalopram (LEXAPRO) 5 MG tablet   finasteride (PROSCAR) 5 MG tablet   losartan  (COZAAR ) 100 MG tablet   metoprolol  tartrate (LOPRESSOR ) 25 MG tablet   rosuvastatin  (CRESTOR ) 20 MG tablet   tamsulosin  (FLOMAX ) 0.4 MG CAPS capsule   No current facility-administered medications for this encounter.   Burnard CHRISTELLA Odis DEVONNA [facility] Short Stay/Anesthesiology [facility] Phone [phone] 07/28/2024 2:04 PM

## 2024-08-02 MED ORDER — CEFAZOLIN SODIUM-DEXTROSE 2-4 GM/100ML-% IV SOLN: 2.0000 g | INTRAVENOUS | Status: DC

## 2024-08-05 ENCOUNTER — Ambulatory Visit (HOSPITAL_COMMUNITY)

## 2024-08-05 ENCOUNTER — Encounter (HOSPITAL_COMMUNITY): Payer: Self-pay | Admitting: Medical

## 2024-08-05 ENCOUNTER — Ambulatory Visit (HOSPITAL_BASED_OUTPATIENT_CLINIC_OR_DEPARTMENT_OTHER): Payer: Self-pay | Admitting: Anesthesiology

## 2024-08-05 ENCOUNTER — Ambulatory Visit (HOSPITAL_COMMUNITY)
Admission: RE | Admit: 2024-08-05 | Discharge: 2024-08-05 | Disposition: A | Discharge status: Home / Self Care | Source: Ambulatory Visit | Attending: Urology | Admitting: Urology

## 2024-08-05 ENCOUNTER — Other Ambulatory Visit: Payer: Self-pay

## 2024-08-05 ENCOUNTER — Encounter (HOSPITAL_COMMUNITY): Admission: RE | Disposition: A | Payer: Self-pay | Source: Ambulatory Visit | Attending: Urology

## 2024-08-05 ENCOUNTER — Encounter (HOSPITAL_COMMUNITY): Payer: Self-pay | Admitting: Urology

## 2024-08-05 DIAGNOSIS — I10 Essential (primary) hypertension: Secondary | ICD-10-CM

## 2024-08-05 DIAGNOSIS — R310 Gross hematuria: Secondary | ICD-10-CM | POA: Diagnosis not present

## 2024-08-05 DIAGNOSIS — N202 Calculus of kidney with calculus of ureter: Secondary | ICD-10-CM | POA: Diagnosis not present

## 2024-08-05 DIAGNOSIS — Z87891 Personal history of nicotine dependence: Secondary | ICD-10-CM | POA: Insufficient documentation

## 2024-08-05 DIAGNOSIS — E7800 Pure hypercholesterolemia, unspecified: Secondary | ICD-10-CM

## 2024-08-05 DIAGNOSIS — N201 Calculus of ureter: Secondary | ICD-10-CM

## 2024-08-05 DIAGNOSIS — I2510 Atherosclerotic heart disease of native coronary artery without angina pectoris: Secondary | ICD-10-CM | POA: Diagnosis not present

## 2024-08-05 DIAGNOSIS — N35919 Unspecified urethral stricture, male, unspecified site: Secondary | ICD-10-CM | POA: Insufficient documentation

## 2024-08-05 DIAGNOSIS — I4891 Unspecified atrial fibrillation: Secondary | ICD-10-CM | POA: Insufficient documentation

## 2024-08-05 DIAGNOSIS — N200 Calculus of kidney: Secondary | ICD-10-CM | POA: Diagnosis present

## 2024-08-05 HISTORY — PX: CYSTOSCOPY/URETEROSCOPY/HOLMIUM LASER/STENT PLACEMENT: SHX6546

## 2024-08-05 MED ORDER — PROPOFOL 10 MG/ML IV BOLUS
INTRAVENOUS | Status: DC | PRN
  Administered 2024-08-05: 100 mg via INTRAVENOUS

## 2024-08-05 MED ORDER — DEXAMETHASONE SOD PHOSPHATE PF 10 MG/ML IJ SOLN
INTRAMUSCULAR | Status: DC | PRN
  Administered 2024-08-05: 10 mg via INTRAVENOUS

## 2024-08-05 MED ORDER — SODIUM CHLORIDE 0.9 % IV SOLN: INTRAVENOUS | Status: DC

## 2024-08-05 MED ORDER — FENTANYL CITRATE (PF) 50 MCG/ML IJ SOSY: 25.0000 ug | PREFILLED_SYRINGE | INTRAMUSCULAR | Status: DC | PRN

## 2024-08-05 MED ORDER — SODIUM CHLORIDE 0.9 % IR SOLN
Status: DC | PRN
  Administered 2024-08-05: 3000 mL via INTRAVESICAL

## 2024-08-05 MED ORDER — LIDOCAINE HCL (PF) 2 % IJ SOLN
INTRAMUSCULAR | Status: DC | PRN
  Administered 2024-08-05: 60 mg via INTRADERMAL

## 2024-08-05 MED ORDER — CHLORHEXIDINE GLUCONATE 0.12 % MT SOLN
15.0000 mL | Freq: Once | OROMUCOSAL | Status: AC
  Administered 2024-08-05: 15 mL via OROMUCOSAL

## 2024-08-05 MED ORDER — ONDANSETRON HCL 4 MG/2ML IJ SOLN
INTRAMUSCULAR | Status: AC
  Filled 2024-08-05: qty 2

## 2024-08-05 MED ORDER — FENTANYL CITRATE (PF) 100 MCG/2ML IJ SOLN
INTRAMUSCULAR | Status: AC
  Filled 2024-08-05: qty 2

## 2024-08-05 MED ORDER — OXYCODONE HCL 5 MG PO TABS: 5.0000 mg | ORAL_TABLET | Freq: Once | ORAL | Status: DC | PRN

## 2024-08-05 MED ORDER — PHENYLEPHRINE 80 MCG/ML (10ML) SYRINGE FOR IV PUSH (FOR BLOOD PRESSURE SUPPORT)
PREFILLED_SYRINGE | INTRAVENOUS | Status: DC | PRN
  Administered 2024-08-05: 80 ug via INTRAVENOUS
  Administered 2024-08-05 (×3): 160 ug via INTRAVENOUS

## 2024-08-05 MED ORDER — LACTATED RINGERS IV SOLN: INTRAVENOUS | Status: DC

## 2024-08-05 MED ORDER — CEPHALEXIN 500 MG PO CAPS: 500.0000 mg | ORAL_CAPSULE | Freq: Two times a day (BID) | ORAL | 0 refills | Status: DC

## 2024-08-05 MED ORDER — ACETAMINOPHEN 500 MG PO TABS
1000.0000 mg | ORAL_TABLET | Freq: Once | ORAL | Status: AC
  Administered 2024-08-05: 500 mg via ORAL
  Filled 2024-08-05: qty 2

## 2024-08-05 MED ORDER — IOHEXOL 300 MG/ML  SOLN
INTRAMUSCULAR | Status: DC | PRN
  Administered 2024-08-05: 25 mL via URETHRAL

## 2024-08-05 MED ORDER — AMISULPRIDE (ANTIEMETIC) 5 MG/2ML IV SOLN: 10.0000 mg | Freq: Once | INTRAVENOUS | Status: DC | PRN

## 2024-08-05 MED ORDER — FENTANYL CITRATE (PF) 100 MCG/2ML IJ SOLN
INTRAMUSCULAR | Status: DC | PRN
  Administered 2024-08-05 (×2): 50 ug via INTRAVENOUS

## 2024-08-05 MED ORDER — ONDANSETRON HCL 4 MG/2ML IJ SOLN
INTRAMUSCULAR | Status: DC | PRN
  Administered 2024-08-05: 4 mg via INTRAVENOUS

## 2024-08-05 MED ORDER — LIDOCAINE HCL (PF) 2 % IJ SOLN
INTRAMUSCULAR | Status: AC
  Filled 2024-08-05: qty 5

## 2024-08-05 MED ORDER — SODIUM CHLORIDE 0.9 % IV SOLN
INTRAVENOUS | Status: AC
  Filled 2024-08-05: qty 20

## 2024-08-05 MED ORDER — OXYCODONE HCL 5 MG/5ML PO SOLN: 5.0000 mg | Freq: Once | ORAL | Status: DC | PRN

## 2024-08-05 MED ORDER — ORAL CARE MOUTH RINSE: 15.0000 mL | Freq: Once | OROMUCOSAL | Status: AC

## 2024-08-05 NOTE — Op Note (Signed)
 Preoperative diagnosis: Gross hematuria, bilateral renal stones Postoperative diagnosis: Urethral stricture, right ureteral stone, left renal stone  Procedure: Cystoscopy with urethral dilation, bilateral ureteroscopy laser lithotripsy and ureteral stent placement, Foley catheter placement  Surgeon: Geoffry Bannister  Anesthesia: General  Indication for procedure: 83 year old male with history of gross hematuria with bilateral renal stones.  He had not had flank pain but he did recall some back pain on and off recently and 7 to 10 days ago had some fever nausea vomiting which resolved.  He thought it was may be from moving heavy pot.  Findings: On exam the meatus appeared normal and the glans appeared normal.  On cystoscopy there was a distal penile stricture of about 15 mm which was dilated to 22 French.  Prostate was well resected with some neovascularity around the prostatic urethra and bladder neck.  Ureteral orifices were in their normal orthotopic position with clear E flux.  Left retrograde pyelogram-this outlined a J-hook ureter but no filling defect.  He had a narrow lower pole channel going down to the filling defect or the stone.  Left ureteroscopy revealed no ureteral or collecting system lesions and the stone was located through a narrow infundibulum in the left lower pole and dusted.  Right retrograde pyelogram-this outlined also a J-hook ureter with a filling defect in the proximal ureter consistent with a 4 mm stone and proximal hydroureteronephrosis of a moderate nature.  Right ureteroscopy confirmed the stone in the right proximal ureter.  It was dusted.  Description of procedure: After consent was obtained patient brought to the operating room.  After adequate anesthesia was placed lithotomy position and prepped and draped in the usual sterile fashion.  Timeout was performed to confirm the patient and procedure.  The cystoscope was passed per urethra but there was a distal penile  stricture I could see.  I then passed a sensor wire and coiled that in the bladder under fluoroscopic guidance.  Urethra was dilated to 22 French with [facility] dilators.  Then I passed the 21 French rigid cystoscope without difficulty and the bladder was carefully inspected with a 30 and 70 degree lens.  The left ureteral orifice was then cannulated with 6 French open-ended catheter left retrograde injection of contrast was performed.  I needed an angled Glidewire to navigate the J-hook and get proximal access.  This straightened the ureter.  And then an access sheath went very easily without any resistance.  Used the access sheath to get 2 wires in place and went adjacent to the sensor wire leaving it as the safety.  A dual channel digital ureteroscope was advanced in the collecting system inspected.  No lesions noted.  I then found the narrow infundibulum which the scope passed and the stone was located in the left lower pole and it was dusted with 242 m Moses laser fiber.  I then backed out and backed the access sheath out onto the ureteroscope.  The collecting system renal pelvis and ureter was inspected on the way out and noted to have no injury.  We then turned our attention to the right ureteral orifice and it was cannulated and retrograde injection of contrast was performed.  A sensor wire was then advanced and coiled in the calyx.  I then took a short single channel semirigid scope and was able to get into the proximal ureter without difficulty.  The stone was located and it was dusted at setting of 0.2 and 70.  As I backed out some fragments followed  and these were dusted.  I went back up proximal to stone location and noted there to be no other fragments or any ureteral injury in view distally up toward the kidney.  I then carefully backed down out of the ureter inspected again no significant stone fragment no injury.  The right wire was backloaded on the cystoscope and a 624 cm stent advanced.  The wire  was removed with good coil seen in the kidney and the bladder.  The left wire was then backloaded on the cystoscope and a 624 cm stent advanced up the left side and again a good coil seen in the kidney and the bladder once the wire was removed.  The scope was carefully backed out an 57 French Foley catheter placed.  This to be left to gravity drainage.  The stent strings were taped to the Foley.  He was awakened and taken to the cover room in stable condition.  Complications: None  Blood loss: Minimal  Specimens: None  Drains: 6 x 24 cm bilateral ureteral stents, 18 French Foley catheter.  Strings taped to the Foley.  Disposition: Patient stable to PACU.  I went over the procedure, postop care and follow-up with [place].

## 2024-08-05 NOTE — Interval H&P Note (Signed)
 History and Physical Interval Note:  08/05/2024 8:24 AM  Edward [name]  has presented today for surgery, with the diagnosis of BILATERAL KINDNEY STONES.  The various methods of treatment have been discussed with the patient and family. After consideration of risks, benefits and other options for treatment, the patient has consented to  Procedures: CYSTOSCOPY/URETEROSCOPY/HOLMIUM LASER/STENT PLACEMENT (Bilateral) as a surgical intervention.  The patient's history has been reviewed, patient examined, no change in status, stable for surgery.  I have reviewed the patient's chart and labs.  He has not had further gross hematuria.  No dysuria or fever.  I discussed the diagnostic nature of the procedure and he may need a bladder or ureteral biopsy if any lesions were found.  We discussed he may need a Foley catheter if urethral dilation is required.  He has had a slower stream.  History of stricture.  Also discussed the need for bilateral stenting and staged procedure if retrograde access is difficult.  We discussed the nature risks and benefits to ureteroscopy with laser versus continued surveillance of the stones.  The stones could grow or pass.  Some guidelines do recommend waiting till stones are 10 to 15 mm before intervening.  All questions answered - he would like to proceed as planned today. [name]

## 2024-08-05 NOTE — Anesthesia Postprocedure Evaluation (Signed)
"   Anesthesia Post Note  Patient: Edward [name]  Procedure(s) Performed: CYSTOSCOPY/URETEROSCOPY/HOLMIUM LASER/STENT PLACEMENT/URETHRAL DILATION (Bilateral: Penis)     Patient location during evaluation: PACU Anesthesia Type: General Level of consciousness: awake and alert Pain management: pain level controlled Vital Signs Assessment: post-procedure vital signs reviewed and stable Respiratory status: spontaneous breathing, nonlabored ventilation, respiratory function stable and patient connected to nasal cannula oxygen Cardiovascular status: blood pressure returned to baseline and stable Postop Assessment: no apparent nausea or vomiting Anesthetic complications: no   No notable events documented.  Last Vitals:  Vitals:   08/05/24 1045 08/05/24 1104  BP: (!) 152/60 133/70  Pulse: 74 61  Resp: (!) 26   Temp: (!) 36.1 C   SpO2: 95% 97%    Last Pain:  Vitals:   08/05/24 1104  TempSrc:   PainSc: 0-No pain                 [name] L Jamin Panther      "

## 2024-08-05 NOTE — Anesthesia Procedure Notes (Signed)
 Procedure Name: LMA Insertion Date/Time: 08/05/2024 8:38 AM  Performed by: Carleton Garnette SAUNDERS, CRNAPre-anesthesia Checklist: Patient identified, Emergency Drugs available, Suction available, Patient being monitored and Timeout performed Patient Re-evaluated:Patient Re-evaluated prior to induction Oxygen Delivery Method: Circle system utilized Preoxygenation: Pre-oxygenation with 100% oxygen Induction Type: IV induction LMA: LMA inserted LMA Size: 4.0 Tube type: Oral Number of attempts: 1 Placement Confirmation: positive ETCO2 and breath sounds checked- equal and bilateral Tube secured with: Tape Dental Injury: Teeth and Oropharynx as per pre-operative assessment

## 2024-08-05 NOTE — Transfer of Care (Signed)
 Immediate Anesthesia Transfer of Care Note  Patient: Edward [name]  Procedure(s) Performed: CYSTOSCOPY/URETEROSCOPY/HOLMIUM LASER/STENT PLACEMENT/URETHRAL DILATION (Bilateral: Penis)  Patient Location: PACU  Anesthesia Type:General  Level of Consciousness: sedated  Airway & Oxygen Therapy: Patient Spontanous Breathing and Patient connected to face mask oxygen  Post-op Assessment: Report given to RN and Post -op Vital signs reviewed and stable  Post vital signs: Reviewed and stable  Last Vitals:  Vitals Value Taken Time  BP 112/94 08/05/24 10:15  Temp    Pulse 78 08/05/24 10:17  Resp 25 08/05/24 10:17  SpO2 100 % 08/05/24 10:17  Vitals shown include unfiled device data.  Last Pain:  Vitals:   08/05/24 0722  TempSrc:   PainSc: 0-No pain      Patients Stated Pain Goal: 5 (08/05/24 0711)  Complications: No notable events documented.

## 2024-08-06 ENCOUNTER — Encounter (HOSPITAL_COMMUNITY): Payer: Self-pay | Admitting: Urology

## 2024-09-01 ENCOUNTER — Other Ambulatory Visit (HOSPITAL_COMMUNITY): Payer: Self-pay | Admitting: Registered Nurse

## 2024-09-01 DIAGNOSIS — R630 Anorexia: Secondary | ICD-10-CM

## 2024-09-01 DIAGNOSIS — N390 Urinary tract infection, site not specified: Secondary | ICD-10-CM

## 2024-09-01 DIAGNOSIS — N179 Acute kidney failure, unspecified: Secondary | ICD-10-CM

## 2024-09-02 ENCOUNTER — Ambulatory Visit (HOSPITAL_COMMUNITY)
Admission: RE | Admit: 2024-09-02 | Discharge: 2024-09-02 | Disposition: A | Discharge status: Home / Self Care | Source: Ambulatory Visit | Attending: Registered Nurse | Admitting: Registered Nurse

## 2024-09-02 DIAGNOSIS — N179 Acute kidney failure, unspecified: Secondary | ICD-10-CM | POA: Insufficient documentation

## 2024-09-02 DIAGNOSIS — N390 Urinary tract infection, site not specified: Secondary | ICD-10-CM | POA: Insufficient documentation

## 2024-09-02 DIAGNOSIS — R630 Anorexia: Secondary | ICD-10-CM | POA: Diagnosis present

## 2024-09-12 ENCOUNTER — Inpatient Hospital Stay (HOSPITAL_COMMUNITY)
Admission: EM | Admit: 2024-09-12 | Discharge: 2024-09-17 | DRG: 698 | Disposition: A | Discharge status: Home / Self Care | Attending: Internal Medicine | Admitting: Internal Medicine

## 2024-09-12 ENCOUNTER — Encounter (HOSPITAL_COMMUNITY): Payer: Self-pay | Admitting: Emergency Medicine

## 2024-09-12 ENCOUNTER — Emergency Department (HOSPITAL_COMMUNITY)

## 2024-09-12 ENCOUNTER — Other Ambulatory Visit: Payer: Self-pay

## 2024-09-12 DIAGNOSIS — N132 Hydronephrosis with renal and ureteral calculous obstruction: Secondary | ICD-10-CM | POA: Diagnosis not present

## 2024-09-12 DIAGNOSIS — Z7982 Long term (current) use of aspirin: Secondary | ICD-10-CM

## 2024-09-12 DIAGNOSIS — Z87891 Personal history of nicotine dependence: Secondary | ICD-10-CM

## 2024-09-12 DIAGNOSIS — N3001 Acute cystitis with hematuria: Secondary | ICD-10-CM

## 2024-09-12 DIAGNOSIS — N136 Pyonephrosis: Secondary | ICD-10-CM | POA: Diagnosis present

## 2024-09-12 DIAGNOSIS — N179 Acute kidney failure, unspecified: Secondary | ICD-10-CM | POA: Diagnosis present

## 2024-09-12 DIAGNOSIS — I10 Essential (primary) hypertension: Secondary | ICD-10-CM | POA: Diagnosis not present

## 2024-09-12 DIAGNOSIS — S0083XA Contusion of other part of head, initial encounter: Secondary | ICD-10-CM | POA: Diagnosis present

## 2024-09-12 DIAGNOSIS — E860 Dehydration: Secondary | ICD-10-CM | POA: Diagnosis present

## 2024-09-12 DIAGNOSIS — Z1611 Resistance to penicillins: Secondary | ICD-10-CM | POA: Diagnosis present

## 2024-09-12 DIAGNOSIS — Y92002 Bathroom of unspecified non-institutional (private) residence single-family (private) house as the place of occurrence of the external cause: Secondary | ICD-10-CM | POA: Diagnosis not present

## 2024-09-12 DIAGNOSIS — R55 Syncope and collapse: Secondary | ICD-10-CM | POA: Diagnosis present

## 2024-09-12 DIAGNOSIS — R338 Other retention of urine: Secondary | ICD-10-CM | POA: Diagnosis present

## 2024-09-12 DIAGNOSIS — N401 Enlarged prostate with lower urinary tract symptoms: Secondary | ICD-10-CM | POA: Diagnosis present

## 2024-09-12 DIAGNOSIS — A419 Sepsis, unspecified organism: Secondary | ICD-10-CM | POA: Diagnosis present

## 2024-09-12 DIAGNOSIS — T83511A Infection and inflammatory reaction due to indwelling urethral catheter, initial encounter: Principal | ICD-10-CM | POA: Diagnosis present

## 2024-09-12 DIAGNOSIS — E7800 Pure hypercholesterolemia, unspecified: Secondary | ICD-10-CM | POA: Diagnosis present

## 2024-09-12 DIAGNOSIS — I480 Paroxysmal atrial fibrillation: Secondary | ICD-10-CM | POA: Diagnosis present

## 2024-09-12 DIAGNOSIS — I2510 Atherosclerotic heart disease of native coronary artery without angina pectoris: Secondary | ICD-10-CM | POA: Diagnosis present

## 2024-09-12 DIAGNOSIS — W1811XA Fall from or off toilet without subsequent striking against object, initial encounter: Secondary | ICD-10-CM | POA: Diagnosis present

## 2024-09-12 DIAGNOSIS — A4159 Other Gram-negative sepsis: Secondary | ICD-10-CM | POA: Diagnosis present

## 2024-09-12 DIAGNOSIS — W19XXXA Unspecified fall, initial encounter: Secondary | ICD-10-CM | POA: Diagnosis not present

## 2024-09-12 DIAGNOSIS — Z9861 Coronary angioplasty status: Secondary | ICD-10-CM | POA: Diagnosis not present

## 2024-09-12 DIAGNOSIS — Z95818 Presence of other cardiac implants and grafts: Secondary | ICD-10-CM | POA: Diagnosis not present

## 2024-09-12 DIAGNOSIS — N3941 Urge incontinence: Secondary | ICD-10-CM | POA: Diagnosis present

## 2024-09-12 DIAGNOSIS — Y846 Urinary catheterization as the cause of abnormal reaction of the patient, or of later complication, without mention of misadventure at the time of the procedure: Secondary | ICD-10-CM | POA: Diagnosis present

## 2024-09-12 DIAGNOSIS — D509 Iron deficiency anemia, unspecified: Secondary | ICD-10-CM | POA: Diagnosis present

## 2024-09-12 DIAGNOSIS — Z9079 Acquired absence of other genital organ(s): Secondary | ICD-10-CM | POA: Diagnosis not present

## 2024-09-12 DIAGNOSIS — Z8673 Personal history of transient ischemic attack (TIA), and cerebral infarction without residual deficits: Secondary | ICD-10-CM | POA: Diagnosis not present

## 2024-09-12 DIAGNOSIS — N390 Urinary tract infection, site not specified: Secondary | ICD-10-CM

## 2024-09-12 DIAGNOSIS — S0990XA Unspecified injury of head, initial encounter: Secondary | ICD-10-CM

## 2024-09-12 DIAGNOSIS — I129 Hypertensive chronic kidney disease with stage 1 through stage 4 chronic kidney disease, or unspecified chronic kidney disease: Secondary | ICD-10-CM | POA: Diagnosis present

## 2024-09-12 DIAGNOSIS — N1831 Chronic kidney disease, stage 3a: Secondary | ICD-10-CM | POA: Diagnosis present

## 2024-09-12 DIAGNOSIS — F05 Delirium due to known physiological condition: Secondary | ICD-10-CM | POA: Diagnosis not present

## 2024-09-12 DIAGNOSIS — N35819 Other urethral stricture, male, unspecified site: Secondary | ICD-10-CM | POA: Diagnosis present

## 2024-09-12 DIAGNOSIS — R531 Weakness: Secondary | ICD-10-CM

## 2024-09-12 DIAGNOSIS — I359 Nonrheumatic aortic valve disorder, unspecified: Secondary | ICD-10-CM | POA: Insufficient documentation

## 2024-09-12 DIAGNOSIS — R195 Other fecal abnormalities: Secondary | ICD-10-CM | POA: Insufficient documentation

## 2024-09-12 DIAGNOSIS — E875 Hyperkalemia: Secondary | ICD-10-CM | POA: Diagnosis present

## 2024-09-12 DIAGNOSIS — F39 Unspecified mood [affective] disorder: Secondary | ICD-10-CM | POA: Insufficient documentation

## 2024-09-12 DIAGNOSIS — Z79899 Other long term (current) drug therapy: Secondary | ICD-10-CM

## 2024-09-12 LAB — CBC WITH DIFFERENTIAL/PLATELET
Abs Immature Granulocytes: 0.18 10*3/uL — ABNORMAL HIGH (ref 0.00–0.07)
Basophils Absolute: 0 10*3/uL (ref 0.0–0.1)
Basophils Relative: 0 %
Eosinophils Absolute: 0 10*3/uL (ref 0.0–0.5)
Eosinophils Relative: 0 %
HCT: 33.9 % — ABNORMAL LOW (ref 39.0–52.0)
Hemoglobin: 10.8 g/dL — ABNORMAL LOW (ref 13.0–17.0)
Immature Granulocytes: 1 %
Lymphocytes Relative: 1 %
Lymphs Abs: 0.2 10*3/uL — ABNORMAL LOW (ref 0.7–4.0)
MCH: 29.9 pg (ref 26.0–34.0)
MCHC: 31.9 g/dL (ref 30.0–36.0)
MCV: 93.9 fL (ref 80.0–100.0)
Monocytes Absolute: 0.6 10*3/uL (ref 0.1–1.0)
Monocytes Relative: 3 %
Neutro Abs: 19.4 10*3/uL — ABNORMAL HIGH (ref 1.7–7.7)
Neutrophils Relative %: 95 %
Platelets: 277 10*3/uL (ref 150–400)
RBC: 3.61 MIL/uL — ABNORMAL LOW (ref 4.22–5.81)
RDW: 14.3 % (ref 11.5–15.5)
WBC: 20.4 10*3/uL — ABNORMAL HIGH (ref 4.0–10.5)
nRBC: 0 % (ref 0.0–0.2)

## 2024-09-12 LAB — COMPREHENSIVE METABOLIC PANEL WITH GFR
ALT: 22 U/L (ref 0–44)
AST: 25 U/L (ref 15–41)
Albumin: 3.4 g/dL — ABNORMAL LOW (ref 3.5–5.0)
Alkaline Phosphatase: 65 U/L (ref 38–126)
Anion gap: 12 (ref 5–15)
BUN: 41 mg/dL — ABNORMAL HIGH (ref 8–23)
CO2: 17 mmol/L — ABNORMAL LOW (ref 22–32)
Calcium: 9.2 mg/dL (ref 8.9–10.3)
Chloride: 113 mmol/L — ABNORMAL HIGH (ref 98–111)
Creatinine, Ser: 2.89 mg/dL — ABNORMAL HIGH (ref 0.61–1.24)
GFR, Estimated: 21 mL/min — ABNORMAL LOW
Glucose, Bld: 94 mg/dL (ref 70–99)
Potassium: 5.1 mmol/L (ref 3.5–5.1)
Sodium: 142 mmol/L (ref 135–145)
Total Bilirubin: 0.3 mg/dL (ref 0.0–1.2)
Total Protein: 6.7 g/dL (ref 6.5–8.1)

## 2024-09-12 LAB — URINALYSIS, ROUTINE W REFLEX MICROSCOPIC
Bilirubin Urine: NEGATIVE
Glucose, UA: NEGATIVE mg/dL
Ketones, ur: NEGATIVE mg/dL
Nitrite: POSITIVE — AB
Protein, ur: NEGATIVE mg/dL
Specific Gravity, Urine: 1.012 (ref 1.005–1.030)
WBC, UA: >50 WBC/hpf (ref 0–5)
pH: 5 (ref 5.0–8.0)

## 2024-09-12 LAB — LIPASE, BLOOD: Lipase: 27 U/L (ref 11–51)

## 2024-09-12 MED ORDER — FINASTERIDE 5 MG PO TABS
5.0000 mg | ORAL_TABLET | Freq: Every evening | ORAL | Status: DC
  Administered 2024-09-12 – 2024-09-16 (×5): 5 mg via ORAL
  Filled 2024-09-12 (×5): qty 1

## 2024-09-12 MED ORDER — ACETAMINOPHEN 500 MG PO TABS: 500.0000 mg | ORAL_TABLET | ORAL | Status: DC

## 2024-09-12 MED ORDER — ROSUVASTATIN CALCIUM 10 MG PO TABS
20.0000 mg | ORAL_TABLET | Freq: Every evening | ORAL | Status: DC
  Administered 2024-09-12 – 2024-09-16 (×5): 20 mg via ORAL
  Filled 2024-09-12 (×5): qty 2

## 2024-09-12 MED ORDER — LACTATED RINGERS IV SOLN
INTRAVENOUS | Status: AC
  Administered 2024-09-13: 1000 mL via INTRAVENOUS

## 2024-09-12 MED ORDER — SODIUM CHLORIDE 0.9 % IV SOLN: 2.0000 g | Freq: Two times a day (BID) | INTRAVENOUS | Status: DC

## 2024-09-12 MED ORDER — ASPIRIN 81 MG PO TBEC
81.0000 mg | DELAYED_RELEASE_TABLET | Freq: Every day | ORAL | Status: DC
  Administered 2024-09-13: 81 mg via ORAL
  Filled 2024-09-12: qty 1

## 2024-09-12 MED ORDER — LACTATED RINGERS IV BOLUS
1000.0000 mL | Freq: Once | INTRAVENOUS | Status: AC
  Administered 2024-09-12: 1000 mL via INTRAVENOUS

## 2024-09-12 MED ORDER — TAMSULOSIN HCL 0.4 MG PO CAPS
0.4000 mg | ORAL_CAPSULE | Freq: Every evening | ORAL | Status: DC
  Administered 2024-09-12 – 2024-09-16 (×5): 0.4 mg via ORAL
  Filled 2024-09-12 (×5): qty 1

## 2024-09-12 MED ORDER — ACETAMINOPHEN 325 MG PO TABS: 650.0000 mg | ORAL_TABLET | Freq: Four times a day (QID) | ORAL | Status: DC | PRN

## 2024-09-12 MED ORDER — SODIUM CHLORIDE 0.9 % IV SOLN
2.0000 g | INTRAVENOUS | Status: DC
  Administered 2024-09-12 – 2024-09-13 (×2): 2 g via INTRAVENOUS
  Filled 2024-09-12 (×2): qty 12.5

## 2024-09-12 MED ORDER — SODIUM CHLORIDE 0.9 % IV SOLN
2.0000 g | Freq: Once | INTRAVENOUS | Status: AC
  Administered 2024-09-12: 2 g via INTRAVENOUS
  Filled 2024-09-12: qty 20

## 2024-09-12 MED ORDER — ONDANSETRON HCL 4 MG PO TABS: 4.0000 mg | ORAL_TABLET | Freq: Four times a day (QID) | ORAL | Status: DC | PRN

## 2024-09-12 MED ORDER — ACETAMINOPHEN 650 MG RE SUPP: 650.0000 mg | Freq: Four times a day (QID) | RECTAL | Status: DC | PRN

## 2024-09-12 MED ORDER — ONDANSETRON HCL 4 MG/2ML IJ SOLN: 4.0000 mg | Freq: Four times a day (QID) | INTRAMUSCULAR | Status: DC | PRN

## 2024-09-12 MED ORDER — LOSARTAN POTASSIUM 50 MG PO TABS: 100.0000 mg | ORAL_TABLET | Freq: Every day | ORAL | Status: DC

## 2024-09-12 MED ORDER — AMLODIPINE BESYLATE 5 MG PO TABS
2.5000 mg | ORAL_TABLET | Freq: Every evening | ORAL | Status: DC
  Administered 2024-09-12: 2.5 mg via ORAL
  Filled 2024-09-12: qty 1

## 2024-09-12 NOTE — ED Provider Triage Note (Signed)
 Emergency Medicine Provider Triage Evaluation Note  Edward [name] , a 84 y.o. male  was evaluated in triage.  Pt complains of fall this morning and abdominal pain with decreased urine output.  Patient reports procedure done by urology for kidney stones December 11.  Patient has dizziness at baseline but has had worsening dizziness since this issue.  Patient reports was in the restroom this morning when he stood up got dizzy and fell to the ground without any loss of consciousness.  Patient denies blood thinners.  Patient hit the top of his head and the right side of his face.  Patient also endorses some abdominal pain and decreased urine output.  Review of Systems  Positive: Fall, abdominal pain, decreased Negative: Loss of consciousness, shortness of breath, chest pain  Physical Exam  BP (!) 126/110 (BP Location: Left Arm)   Pulse (!) 115   Temp 98.6 F (37 C) (Oral)   Resp 18   SpO2 99%  Gen:   Awake, no distress   Resp:  Normal effort clear to auscultation MSK:   Moves extremities without difficulty, denies pain to palpation other joints. Other:    Medical Decision Making  Medically screening exam initiated at 12:47 PM.  Appropriate orders placed.  Edward [name] was informed that the remainder of the evaluation will be completed by another provider, this initial triage assessment does not replace that evaluation, and the importance of remaining in the ED until their evaluation is complete.     Myriam Fonda RAMAN, [place] 09/12/24 1249

## 2024-09-12 NOTE — ED Provider Notes (Addendum)
 " [place] EMERGENCY DEPARTMENT AT [facility] Provider Note   CSN: [id number] Arrival date & time: 09/12/24  1038     Patient presents with: Edward [name]   Edward [name] is a 84 y.o. male.   HPI Patient reports that he has had extreme fatigue and weakness since Cystoscopy with urethral dilation, bilateral ureteroscopy laser lithotripsy and ureteral stent placement, Foley catheter placement by Dr. Nieves [id number].  Reports ever since then he has just felt extremely washed out and has really only had enough energy to do a few things in a day and then go to bed really early.  Patient reports even before this however he is suffered from vertiginous dizziness.  He reports that he often does need to hold onto door frames etc. to steady himself.  However combined with the weakness since his procedure, as well as his dizziness, he fell off the toilet this morning and reports that he did strike his head and denies loss of consciousness.  Patient reports he also has low back pain.    Prior to Admission medications  Medication Sig Start Date End Date Taking? Authorizing Provider  amLODipine  (NORVASC ) 2.5 MG tablet Take 2.5 mg by mouth every evening.   Yes [provider]  aspirin  EC 81 MG tablet Take 81 mg by mouth daily. Swallow whole.   Yes [provider]  Coenzyme Q10 (COQ-10) 200 MG CAPS Take 200 mg by mouth in the morning.   Yes [provider]  finasteride  (PROSCAR ) 5 MG tablet Take 5 mg by mouth every evening. 03/09/24  Yes [provider]  losartan  (COZAAR ) 100 MG tablet Take 1 tablet (100 mg total) by mouth daily. Patient taking differently: Take 100 mg by mouth daily in the afternoon. 02/11/24  Yes Jordan, Peter M, MD  metoprolol  tartrate (LOPRESSOR ) 25 MG tablet Take 1 tab twice daily as needed only for heart rate greater than 100 beats per minute. 05/18/23  Yes West, Katlyn D, NP  rosuvastatin  (CRESTOR ) 20 MG tablet Take 1 tablet (20 mg total) by  mouth every evening. 04/15/24  Yes Jordan, Peter M, MD  tamsulosin  (FLOMAX ) 0.4 MG CAPS capsule Take 0.4 mg by mouth every evening. 08/14/20  Yes [provider]  TYLENOL  500 MG tablet Take 500 mg by mouth See admin instructions. Take 500 mg by mouth in the evening and an additional 500 mg at noontime as needed for discomfort   Yes [provider]  amLODipine  (NORVASC ) 2.5 MG tablet Take 1 tablet (2.5 mg total) by mouth daily. Patient not taking: Reported on 09/12/2024 03/29/24 09/12/24  Jordan, Peter M, MD  cephALEXin  (KEFLEX ) 500 MG capsule Take 1 capsule (500 mg total) by mouth 2 (two) times daily. starting 08/06/2024. Patient not taking: Reported on 09/12/2024 08/05/24   Nieves Cough, MD    Allergies: Augmentin [amoxicillin-pot clavulanate], Codeine, Levaquin [levofloxacin], and Zocor [simvastatin]    Review of Systems  Updated Vital Signs BP (!) 139/57 (BP Location: Left Arm)   Pulse 95   Temp 97.6 F (36.4 C) (Oral)   Resp 20   SpO2 95%   Physical Exam Constitutional:      Comments: Patient is awake and alert.  Mental status clear.  No respiratory distress.  HENT:     Head:     Comments: Diffuse contusion and abrasion to the forehead but no open laceration, no deep abrasion and no active bleeding.    Nose: Nose normal.     Mouth/Throat:  Mouth: Mucous membranes are moist.     Pharynx: Oropharynx is clear.  Cardiovascular:     Rate and Rhythm: Normal rate and regular rhythm.  Pulmonary:     Effort: Pulmonary effort is normal.     Breath sounds: Normal breath sounds.  Chest:     Chest wall: No tenderness.  Abdominal:     Comments: Abdomen soft.  Patient does not have guarding.  No distention.  Musculoskeletal:        General: Normal range of motion.     Cervical back: Neck supple.     Comments: Patient does have intact range of motion of both upper and lower extremities.  He can independently pick up flex extend each extremity.  Minor abrasions to  both knees.  Skin:    General: Skin is warm and dry.  Neurological:     General: No focal deficit present.     Mental Status: He is oriented to person, place, and time.     Comments: Patient does seem generally weak and fatigued but does not have any focal motor deficit  Psychiatric:        Mood and Affect: Mood normal.     (all labs ordered are listed, but only abnormal results are displayed) Labs Reviewed  CBC WITH DIFFERENTIAL/PLATELET - Abnormal; Notable for the following components:      Result Value   WBC 20.4 (*)    RBC 3.61 (*)    Hemoglobin 10.8 (*)    HCT 33.9 (*)    Neutro Abs 19.4 (*)    Lymphs Abs 0.2 (*)    Abs Immature Granulocytes 0.18 (*)    All other components within normal limits  COMPREHENSIVE METABOLIC PANEL WITH GFR - Abnormal; Notable for the following components:   Chloride 113 (*)    CO2 17 (*)    BUN 41 (*)    Creatinine, Ser 2.89 (*)    Albumin 3.4 (*)    GFR, Estimated 21 (*)    All other components within normal limits  URINALYSIS, ROUTINE W REFLEX MICROSCOPIC - Abnormal; Notable for the following components:   APPearance CLOUDY (*)    Hgb urine dipstick SMALL (*)    Nitrite POSITIVE (*)    Leukocytes,Ua LARGE (*)    Bacteria, UA MANY (*)    All other components within normal limits  URINE CULTURE  CULTURE, BLOOD (ROUTINE X 2)  CULTURE, BLOOD (ROUTINE X 2)  LIPASE, BLOOD    EKG: EKG Interpretation Date/Time:  Monday September 12 2024 13:07:15 EST Ventricular Rate:  110 PR Interval:  182 QRS Duration:  99 QT Interval:  347 QTC Calculation: 470 R Axis:   62  Text Interpretation: Sinus tachycardia voltage increase otherwise no sig change Confirmed by Armenta Canning [id number]) on 09/12/2024 3:21:00 PM  Radiology: CT Head Wo Contrast Result Date: 09/12/2024 CLINICAL DATA:  Head trauma fell off toilet EXAM: CT HEAD WITHOUT CONTRAST TECHNIQUE: Contiguous axial images were obtained from the base of the skull through the vertex without  intravenous contrast. RADIATION DOSE REDUCTION: This exam was performed according to the departmental dose-optimization program which includes automated exposure control, adjustment of the mA and/or kV according to patient size and/or use of iterative reconstruction technique. COMPARISON:  MRI brain 10/30/2020, CT head 01/15/2018 FINDINGS: Brain: No acute territorial infarction, hemorrhage or intracranial mass. 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 The ventricles are nonenlarged Vascular: No hyperdense vessels.  No unexpected calcification Skull: Normal. Negative for  fracture or focal lesion. Sinuses/Orbits: No acute finding. Other: None IMPRESSION: 1. No CT evidence for acute intracranial abnormality. 2. 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 Electronically Signed   By: Luke Bun M.D.   On: 09/12/2024 15:54     Procedures  CRITICAL CARE Performed by: Ludivina Shines   Total critical care time: 30 minutes  Critical care time was exclusive of separately billable procedures and treating other patients.  Critical care was necessary to treat or prevent imminent or life-threatening deterioration.  Critical care was time spent personally by me on the following activities: development of treatment plan with patient and/or surrogate as well as nursing, discussions with consultants, evaluation of patient's response to treatment, examination of patient, obtaining history from patient or surrogate, ordering and performing treatments and interventions, ordering and review of laboratory studies, ordering and review of radiographic studies, pulse oximetry and re-evaluation of patient's condition.  Medications Ordered in the ED  lactated ringers  infusion (has no administration in time range)  cefTRIAXone  (ROCEPHIN ) 2 g in sodium chloride  0.9 % 100 mL IVPB (2 g Intravenous New Bag/Given 09/12/24 1548)  lactated ringers  bolus 1,000 mL (has no  administration in time range)                                    Medical Decision Making Amount and/or Complexity of Data Reviewed Labs: ordered. Radiology: ordered.  Risk Prescription drug management.    Patient presents as outlined.  He had a fall without loss of consciousness.  He does however describe increasing generalized weakness over the past approximately month since his lithotripsy.  Patient reports that he had an indwelling Foley catheter after the procedure, however it was causing a lot of pain because he reports that the catheter above the balloon was really irritating of the bladder so he did remove it himself.  We have placed a condom catheter but he did not come with the Foley in place.  Differential diagnosis includes metabolic derangement\dehydration\infectious etiology.  White count 20,000 H&H 10 and 33 urinalysis positive nitrite positive leuk esterase greater than 50 WBC 0-5 squamous epithelial cell GFR 21 potassium 5.1  Labs suggest dehydration with increasing GFR and probable UTI.  White count 20,000.  Will empirically start fluids and Rocephin .  Will add blood culture and lactic  CT interpreted radiology no acute findings.  CT chest abdomen pelvis pending.  Anticipate the patient will require admission if no significant traumatic injuries are found.  Patient has signs of urinary tract infection with leukocytosis and AKI with significant general weakness.   Consult:Dr Celinda for admission.  Final diagnoses:  Dehydration  Generalized weakness  Acute cystitis with hematuria  Fall, initial encounter  Minor head injury, initial encounter    ED Discharge Orders     None          Shines Ludivina, MD 09/12/24 1605    Shines Ludivina, MD 09/12/24 1626  "

## 2024-09-12 NOTE — ED Notes (Signed)
 ED TO INPATIENT HANDOFF REPORT  ED Nurse Name and Phone #: [phone] Joaquim RAMAN Name/Age/Gender Edward [name] Cory 84 y.o. male Room/Bed: WA02/WA02  Code Status   Code Status: Full Code  Home/SNF/Other Home Patient oriented to: self, place, day, and time Is this baseline? Yes   Triage Complete: Triage complete  Chief Complaint Sepsis secondary to UTI (HCC) [A41.9, N39.0]  Triage Note Pt arriving via [facility] from home. Pt fell off toilet and hit his head on the right side, bleeding controlled. Pt reports kidney stone surgery on the 20th, and has been having multiple episodes of dizziness and falls since. No thinners, takes baby aspirin  daily.   Allergies Allergies[1]  Level of Care/Admitting Diagnosis ED Disposition     ED Disposition  Admit   Condition  --   Comment  Hospital Area: [facility] [[id number]]  Level of Care: Telemetry [5]  Admit to tele based on following criteria: Monitor for Ischemic changes  May admit patient to [facility] or [facility] if equivalent level of care is available:: No  Diagnosis: Sepsis secondary to UTI [facility]) [300253]  Admitting Physician: CELINDA ALM LOT [[id number]]  Attending Physician: CELINDA ALM LOT [[id number]]  Certification:: I certify this patient will need inpatient services for at least 2 midnights  Expected Medical Readiness: 09/14/2024          B Medical/Surgery History Past Medical History:  Diagnosis Date   Arthritis    right hand,  left foot   At risk for sleep apnea    STOP-BANG = 4    SENT TO PCP [id number]   BPH (benign prostatic hypertrophy)    Coronary artery disease CARIOLOGIST-- DR BLANCA   ANGIOPLASTY TO LAD   COVID 2022   mild case   Dysrhythmia    A-fib   Elevated PSA    Frequency of urination    History of kidney stones    Hyperlipidemia    Hypertension    Nocturia    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 Cindie    Spermatocele    left   Stroke [facility])    TIA's   Urgency of urination    Vertigo    Past Surgical History:  Procedure Laterality Date   BALLOON DILATION N/A 07/22/2022   Procedure: ROSEBUD BALLOON DILATION;  Surgeon: Nieves Cough, MD;  Location: [facility];  Service: Urology;  Laterality: N/A;  60 MINS   CORONARY ANGIOPLASTY  1995  DR JORDAN   LAD   CYSTOSCOPY N/A 09/18/2015   Procedure: CYSTOSCOPY;  Surgeon: Cough Nieves, MD;  Location: [facility];  Service: Urology;  Laterality: N/A;   CYSTOSCOPY WITH RETROGRADE PYELOGRAM, URETEROSCOPY AND STENT PLACEMENT Left 03/01/2013   Procedure: CYSTOSCOPY WITH LEFT RETROGRADE PYELOGRAM, LEFT URETEROSCOPY and stent PLACEMENT;  Surgeon: Cough Gwenyth Nieves, MD;  Location: [facility];  Service: Urology;  Laterality: Left;   CYSTOSCOPY/URETEROSCOPY/HOLMIUM LASER/STENT PLACEMENT Bilateral 08/05/2024   Procedure: CYSTOSCOPY/URETEROSCOPY/HOLMIUM LASER/STENT PLACEMENT/URETHRAL DILATION;  Surgeon: Nieves Cough, MD;  Location: [facility];  Service: Urology;  Laterality: Bilateral;   EYE SURGERY Left    cataract surgery   FOOT FUSION Left    SECONDARY TO FX'S   GREEN LIGHT LASER TURP (TRANSURETHRAL RESECTION OF PROSTATE N/A 10/04/2013   Procedure: GREEN LIGHT LASER TURP (TRANSURETHRAL RESECTION OF PROSTATE;  Surgeon: Cough Gwenyth Nieves, MD;  Location: [facility];  Service: Urology;  Laterality: N/A;   INGUINAL HERNIA REPAIR Bilateral  LEFT ATRIAL APPENDAGE OCCLUSION N/A 01/09/2022   Procedure: LEFT ATRIAL APPENDAGE OCCLUSION;  Surgeon: Cindie Ole DASEN, MD;  Location: [facility];  Service: Cardiovascular;  Laterality: N/A;   LOOP RECORDER INSERTION N/A 04/20/2018   Procedure: LOOP RECORDER INSERTION;  Surgeon: Waddell Danelle ORN, MD;  Location: [facility];  Service: Cardiovascular;  Laterality: N/A;   SHOULDER ARTHROSCOPY Right 08/18/2002   SHOULDER ARTHROSCOPY WITH DEBRIDEMENT AND BICEP TENDON REPAIR Left  04/28/2011   SPERMATOCELECTOMY Left 10/04/2013   Procedure: LEFT SPERMATOCELECTOMY;  Surgeon: Donnice Gwenyth Brooks, MD;  Location: [facility];  Service: Urology;  Laterality: Left;   TEE WITHOUT CARDIOVERSION N/A 02/16/2018   Procedure: TRANSESOPHAGEAL ECHOCARDIOGRAM (TEE);  Surgeon: Mona Vinie BROCKS, MD;  Location: [facility] ENDOSCOPY;  Service: Cardiovascular;  Laterality: N/A;   TEE WITHOUT CARDIOVERSION N/A 01/09/2022   Procedure: TRANSESOPHAGEAL ECHOCARDIOGRAM (TEE);  Surgeon: Cindie Ole DASEN, MD;  Location: [facility] INVASIVE CV LAB;  Service: Cardiovascular;  Laterality: N/A;   TRANSURETHRAL RESECTION OF PROSTATE N/A 12/11/2014   Procedure: CYSTOSCOPY, CLOT EVACUATION, WITH FULGURATION ;  Surgeon: Norleen Seltzer, MD;  Location: [facility];  Service: Urology;  Laterality: N/A;   TRANSURETHRAL RESECTION OF PROSTATE N/A 09/18/2015   Procedure: TRANSURETHRAL RESECTION OF THE PROSTATE (TURP);  Surgeon: [name], MD;  Location: [facility];  Service: Urology;  Laterality: N/A;   UMBILICAL HERNIA REPAIR  06/02/2008   UMBILICAL HERNIA REPAIR N/A 11/12/2021   Procedure: UMBILICAL HERNIA REPAIR;  Surgeon: Vernetta Berg, MD;  Location: [facility];  Service: General;  Laterality: N/A;  LMA     A IV Location/Drains/Wounds Patient Lines/Drains/Airways Status     Active Line/Drains/Airways     Name Placement date Placement time Site Days   Peripheral IV 09/12/24 18 G Anterior;Left;Proximal Forearm 09/12/24  --  Forearm  less than 1   Peripheral IV 09/12/24 18 G Left Antecubital 09/12/24  1616  Antecubital  less than 1   Urethral Catheter EMERSON Brooks MD Latex 16 Fr. 07/22/22  0931  Latex  783   Urethral Catheter Dr Brooks Latex 18 Fr. 08/05/24  0959  Latex  38   Ureteral Drain/Stent Right ureter 6 Fr. 08/05/24  0953  Right ureter  38   Ureteral Drain/Stent Left ureter 6 Fr. 08/05/24  0956  Left ureter  38            Intake/Output Last 24 hours  Intake/Output Summary (Last 24 hours) at 09/12/2024  2000 Last data filed at 09/12/2024 1824 Gross per 24 hour  Intake 1696.61 ml  Output --  Net 1696.61 ml    Labs/Imaging Results for orders placed or performed during the hospital encounter of 09/12/24 (from the past 48 hours)  Urinalysis, Routine w reflex microscopic -Urine, Clean Catch     Status: Abnormal   Collection Time: 09/12/24  1:43 PM  Result Value Ref Range   Color, Urine YELLOW YELLOW   APPearance CLOUDY (A) CLEAR   Specific Gravity, Urine 1.012 1.005 - 1.030   pH 5.0 5.0 - 8.0   Glucose, UA NEGATIVE NEGATIVE mg/dL   Hgb urine dipstick SMALL (A) NEGATIVE   Bilirubin Urine NEGATIVE NEGATIVE   Ketones, ur NEGATIVE NEGATIVE mg/dL   Protein, ur NEGATIVE NEGATIVE mg/dL   Nitrite POSITIVE (A) NEGATIVE   Leukocytes,Ua LARGE (A) NEGATIVE   RBC / HPF 11-20 0 - 5 RBC/hpf   WBC, UA >50 0 - 5 WBC/hpf   Bacteria, UA MANY (A) NONE SEEN   Squamous Epithelial / HPF 0-5  0 - 5 /HPF   WBC Clumps PRESENT    Mucus PRESENT    Hyaline Casts, UA PRESENT     Comment: Performed at [facility], [address], [place], [place] [address]  CBC with Differential     Status: Abnormal   Collection Time: 09/12/24  2:40 PM  Result Value Ref Range   WBC 20.4 (H) 4.0 - 10.5 K/uL   RBC 3.61 (L) 4.22 - 5.81 MIL/uL   Hemoglobin 10.8 (L) 13.0 - 17.0 g/dL   HCT 66.0 (L) 60.9 - 47.9 %   MCV 93.9 80.0 - 100.0 fL   MCH 29.9 26.0 - 34.0 pg   MCHC 31.9 30.0 - 36.0 g/dL   RDW 85.6 88.4 - 84.4 %   Platelets 277 150 - 400 K/uL   nRBC 0.0 0.0 - 0.2 %   Neutrophils Relative % 95 %   Neutro Abs 19.4 (H) 1.7 - 7.7 K/uL   Lymphocytes Relative 1 %   Lymphs Abs 0.2 (L) 0.7 - 4.0 K/uL   Monocytes Relative 3 %   Monocytes Absolute 0.6 0.1 - 1.0 K/uL   Eosinophils Relative 0 %   Eosinophils Absolute 0.0 0.0 - 0.5 K/uL   Basophils Relative 0 %   Basophils Absolute 0.0 0.0 - 0.1 K/uL   Immature Granulocytes 1 %   Abs Immature Granulocytes 0.18 (H) 0.00 - 0.07 K/uL    Comment: Performed at  [facility], [address], [place], [place] [address]  Comprehensive metabolic panel     Status: Abnormal   Collection Time: 09/12/24  2:40 PM  Result Value Ref Range   Sodium 142 135 - 145 mmol/L   Potassium 5.1 3.5 - 5.1 mmol/L   Chloride 113 (H) 98 - 111 mmol/L   CO2 17 (L) 22 - 32 mmol/L   Glucose, Bld 94 70 - 99 mg/dL    Comment: Glucose reference range applies only to samples taken after fasting for at least 8 hours.   BUN 41 (H) 8 - 23 mg/dL   Creatinine, Ser 7.10 (H) 0.61 - 1.24 mg/dL   Calcium  9.2 8.9 - 10.3 mg/dL   Total Protein 6.7 6.5 - 8.1 g/dL   Albumin 3.4 (L) 3.5 - 5.0 g/dL   AST 25 15 - 41 U/L   ALT 22 0 - 44 U/L   Alkaline Phosphatase 65 38 - 126 U/L   Total Bilirubin 0.3 0.0 - 1.2 mg/dL   GFR, Estimated 21 (L) >60 mL/min    Comment: (NOTE) Calculated using the CKD-EPI Creatinine Equation (2021)    Anion gap 12 5 - 15    Comment: Performed at [facility], [address] [place], [place] [address]  Lipase, blood     Status: None   Collection Time: 09/12/24  2:40 PM  Result Value Ref Range   Lipase 27 11 - 51 U/L    Comment: Performed at [facility], [address] [place], [place] [address]   CT CHEST ABDOMEN PELVIS WO CONTRAST Result Date: 09/12/2024 EXAM: CT CHEST, ABDOMEN AND PELVIS WITHOUT CONTRAST 09/12/2024 03:45:11 PM TECHNIQUE: CT of the chest, abdomen and pelvis was performed without the administration of intravenous contrast. Multiplanar reformatted images are provided for review. Automated exposure control, iterative reconstruction, and/or weight based adjustment of the mA/kV was utilized to reduce the radiation dose to as low as reasonably achievable. COMPARISON: CT abdomen and pelvis 09/02/2024, cardiac CT 03/10/2022, chest radiograph 05/06/2023. CLINICAL HISTORY:  Polytrauma, blunt. Patient fell, striking the right side. Dizziness. FINDINGS: CHEST: MEDIASTINUM AND LYMPH NODES: Heart: Normal  heart size. Cage prosthesis in the left atrial appendage. Calcification in the coronary arteries. No pericardial effusions. Vessels: Calcification in the aorta. Normal caliber thoracic aorta. Airways: The central airways are clear. Lymph Nodes: No mediastinal, hilar or axillary lymphadenopathy. Other: The thyroid gland is unremarkable. The esophagus is decompressed. There are no mediastinal fluid collections. LUNGS AND PLEURA: Lungs: Mild dependent atelectasis in the lung bases. No airspace disease or consolidation in the lungs. Pleura: No pleural effusion or pneumothorax. ABDOMEN AND PELVIS: LIVER: Unremarkable. GALLBLADDER AND BILE DUCTS: Unremarkable. No biliary ductal dilatation. SPLEEN: No acute abnormality. PANCREAS: No acute abnormality. ADRENAL GLANDS: No adrenal gland nodules. KIDNEYS, URETERS AND BLADDER: Kidneys: Lower pole left intrarenal stone measuring 4 mm diameter. Mild left hydronephrosis and hydroureter, possibly due to reflux. The right kidney is asymmetrically enlarged with stranding around the right kidney. Moderate right hydronephrosis and hydroureter, similar to prior study. Changes could be due to reflux, pyelonephritis, occult stone, or recently passed stone. Ureters: No ureteral stones are demonstrated. Bladder: Mild cellular formation of the bladder wall may indicate evidence of outlet obstruction. No discrete wall thickening, stone, or filling defect in the bladder. Prostate: The prostate gland is enlarged. The posterior urethra appears mildly dilated. GI AND BOWEL: Stomach: Stomach demonstrates no acute abnormality. Bowel: The small bowel and colon are not abnormally distended. No wall thickening or inflammatory stranding is appreciated. Scattered colonic diverticula without evidence of acute diverticulitis. The appendix is not seen. There is no bowel obstruction. REPRODUCTIVE ORGANS: The prostate gland is enlarged. The posterior urethra appears mildly dilated. PERITONEUM AND  RETROPERITONEUM: Fluid/Air: No free air or free fluid is present. Hernia: Minimal periumbilical hernia containing fat. Vascular: Calcification of the abdominal aorta. No aneurysm. Lymph Nodes: No retroperitoneal lymphadenopathy. VASCULATURE: Calcification of the abdominal aorta. No aneurysm. ABDOMINAL AND PELVIS LYMPH NODES: No pelvic lymphadenopathy. No retroperitoneal lymphadenopathy. BONES AND SOFT TISSUES: Bones: Degenerative changes in the spine and hips. No acute bony abnormalities. Soft Tissues: No focal soft tissue abnormality. IMPRESSION: 1. No evidence of acute traumatic injury. Electronically signed by: Elsie Gravely MD 09/12/2024 04:08 PM EST RP Workstation: [id number]   CT Maxillofacial Wo Contrast Result Date: 09/12/2024 CLINICAL DATA:  Blunt facial trauma off toilet EXAM: CT MAXILLOFACIAL WITHOUT CONTRAST TECHNIQUE: Multidetector CT imaging of the maxillofacial structures was performed. Multiplanar CT image reconstructions were also generated. RADIATION DOSE REDUCTION: This exam was performed according to the departmental dose-optimization program which includes automated exposure control, adjustment of the mA and/or kV according to patient size and/or use of iterative reconstruction technique. COMPARISON:  None recent FINDINGS: Osseous: Fluid within the left mastoid air cells. Mandibular heads are normally position. No mandibular fracture. Pterygoid plates and zygomatic arches are intact. No acute nasal bone fracture. Orbits: Negative. No traumatic or inflammatory finding. Sinuses: Mucous retention cysts and mucosal thickening. No fluid levels. No sinus wall fracture Soft tissues: Negative. Limited intracranial: See separately dictated head CT IMPRESSION: 1. No CT evidence for acute facial bone fracture. 2. Left mastoid effusion Electronically Signed   By: Luke Bun M.D.   On: 09/12/2024 16:05   CT Cervical Spine Wo Contrast Result Date: 09/12/2024 CLINICAL DATA:  Toilet EXAM: CT CERVICAL  SPINE WITHOUT CONTRAST TECHNIQUE: Multidetector CT imaging of the cervical spine was performed without intravenous contrast. Multiplanar CT image reconstructions were also generated. RADIATION DOSE REDUCTION: This exam was performed according to the departmental dose-optimization program which  includes automated exposure control, adjustment of the mA and/or kV according to patient size and/or use of iterative reconstruction technique. COMPARISON:  None Available. FINDINGS: Alignment: Straightening of the cervical spine. No subluxation. Facet is normal Skull base and vertebrae: No acute fracture. No primary bone lesion or focal pathologic process. Soft tissues and spinal canal: No prevertebral fluid or swelling. No visible canal hematoma. Disc levels: Diffuse degenerative changes. Mild to moderate diffuse disc space narrowing C3 through C7. Multilevel facet degenerative change with foraminal narrowing. Advanced C1-C2 degenerative change with prominent partially calcified pannus. Narrowing of the canal at the C1-C2 level due to calcified pannus. Posterior disc osteophyte at multiple levels, results in at least mild canal stenosis at C3-C4, C4-C5, moderate stenosis at C5-C6, and moderate severe canal stenosis at C6-C7. Upper chest: Lung apices are clear Other: None IMPRESSION: 1. Straightening of the cervical spine with diffuse degenerative changes. No acute osseous abnormality. Electronically Signed   By: Luke Bun M.D.   On: 09/12/2024 16:02   CT Head Wo Contrast Result Date: 09/12/2024 CLINICAL DATA:  Head trauma fell off toilet EXAM: CT HEAD WITHOUT CONTRAST TECHNIQUE: Contiguous axial images were obtained from the base of the skull through the vertex without intravenous contrast. RADIATION DOSE REDUCTION: This exam was performed according to the departmental dose-optimization program which includes automated exposure control, adjustment of the mA and/or kV according to patient size and/or use of iterative  reconstruction technique. COMPARISON:  MRI brain 10/30/2020, CT head 01/15/2018 FINDINGS: Brain: No acute territorial infarction, hemorrhage or intracranial mass. 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 The ventricles are nonenlarged Vascular: No hyperdense vessels.  No unexpected calcification Skull: Normal. Negative for fracture or focal lesion. Sinuses/Orbits: No acute finding. Other: None IMPRESSION: 1. No CT evidence for acute intracranial abnormality. 2. 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 Electronically Signed   By: Luke Bun M.D.   On: 09/12/2024 15:54    Pending Labs Unresulted Labs (From admission, onward)     Start     Ordered   09/13/24 0500  CBC  Tomorrow morning,   R        09/12/24 1933   09/13/24 0500  Comprehensive metabolic panel  Tomorrow morning,   R        09/12/24 1933   09/12/24 1525  Culture, blood (routine x 2)  BLOOD CULTURE X 2,   R (with STAT occurrences)      09/12/24 1524   09/12/24 1524  Urine Culture  Once,   URGENT       Question:  Indication  Answer:  Sepsis   09/12/24 1524            Vitals/Pain Today's Vitals   09/12/24 1052 09/12/24 1500 09/12/24 1600 09/12/24 1755  BP: (!) 126/110 (!) 139/57  138/70  Pulse: (!) 115 95  92  Resp: 18 20  20   Temp: 98.6 F (37 C) 97.6 F (36.4 C)  98.5 F (36.9 C)  TempSrc: Oral Oral  Oral  SpO2: 99% 95%  97%  Weight:   67.1 kg     Isolation Precautions No active isolations  Medications Medications  lactated ringers  infusion ( Intravenous New Bag/Given 09/12/24 1618)  ceFEPIme  (MAXIPIME ) 2 g in sodium chloride  0.9 % 100 mL IVPB (0 g Intravenous Stopped 09/12/24 1824)  amLODipine  (NORVASC ) tablet 2.5 mg (has no administration in time range)  aspirin  EC tablet 81 mg (has no administration in time  range)  finasteride  (PROSCAR ) tablet 5 mg (has no administration in time range)  rosuvastatin  (CRESTOR ) tablet 20 mg (has no administration in  time range)  tamsulosin  (FLOMAX ) capsule 0.4 mg (has no administration in time range)  acetaminophen  (TYLENOL ) tablet 650 mg (has no administration in time range)    Or  acetaminophen  (TYLENOL ) suppository 650 mg (has no administration in time range)  ondansetron  (ZOFRAN ) tablet 4 mg (has no administration in time range)    Or  ondansetron  (ZOFRAN ) injection 4 mg (has no administration in time range)  cefTRIAXone  (ROCEPHIN ) 2 g in sodium chloride  0.9 % 100 mL IVPB (0 g Intravenous Stopped 09/12/24 1618)  lactated ringers  bolus 1,000 mL (0 mLs Intravenous Stopped 09/12/24 1748)    Mobility non-ambulatory     Focused Assessments    R Recommendations: See Admitting Provider Note  Report given to:   Additional Notes:        [1]  Allergies Allergen Reactions   Augmentin [Amoxicillin-Pot Clavulanate] Diarrhea   Codeine Nausea Only   Levaquin [Levofloxacin] Other (See Comments)    Severe stomach pain   Zocor [Simvastatin] Other (See Comments)    Muscle pain

## 2024-09-12 NOTE — ED Triage Notes (Signed)
 Pt arriving via [facility] from home. Pt fell off toilet and hit his head on the right side, bleeding controlled. Pt reports kidney stone surgery on the 20th, and has been having multiple episodes of dizziness and falls since. No thinners, takes baby aspirin  daily.

## 2024-09-12 NOTE — ED Provider Notes (Signed)
 Patient was seen by Dr. Murl and admitted to the hospitalist service.  They were awaiting a callback from urology.  Discussed with Dr. Sherrilee that patient has an elevated white count of 20,000 and evidence of a UTI with right sided hydronephrosis.  He will send someone to consult on the patient.   Lenor Hollering, MD 09/12/24 1705

## 2024-09-12 NOTE — H&P (Signed)
 " History and Physical    Patient: Edward [name] FMW:[id number] DOB: [birth year] DOA: 09/12/2024 DOS: the patient was seen and examined on 09/12/2024 PCP: Janey Santos, MD  Patient coming from: Home  Chief Complaint:  Chief Complaint  Patient presents with   Fall   HPI: Edward [name] is a 84 y.o. male with medical history significant of osteoarthritis, sleep apnea, BPH, hyperlipidemia, hypertension, nocturia, history of TIA, history of vertigo, history of spermatocele he CAD, COVID-19, paroxysmal atrial fibrillation, history of Watchman left atrial appendage closure device, elevated PSA, nephrolithiasis who was brought via EMS to the emergency department after falling off the toilet, hitting his head on the right side and sustaining laceration.  Last month, he underwent a cystoscopy with ureteral dilatation, bilateral ureteroscopy laser lithotripsy and ureteral stent placement, Foley catheter. He denied fever, chills, rhinorrhea, sore throat, wheezing or hemoptysis.  Occasional chest discomfort with palpitations for which he takes as needed metoprolo.  He denied diaphoresis, PND, orthopnea or pitting edema of the lower extremities. No abdominal pain, nausea, emesis, diarrhea, constipation, melena or hematochezia.  Mild flank pain, but no dysuria, frequency or hematuria. No polyuria, polydipsia, polyphagia or blurred vision.   Lab work: Urinalysis was cloudy with small hemoglobin, positive nitrites, large leukocyte esterase, 11-20 RBC, greater than 50 WBC, many bacteria positive WBC clumps and mucus.  CBC showed white count 20.4, hemoglobin 10.8 g/dL and platelets 722.  Lipase level is normal.  CMP showed a chloride of 113 and CO2 of 17 mmol/L with a normal anion gap.  The rest of the electrolytes were normal.  Glucose 94, BUN 41 and creatinine 2.89 mg/dL.  His creatinine level was 1.4447 days ago.  Imaging: CT head without contrast no CT evidence of acute intracranial normality.  Atrophy of  chronic small vessel ischemic changes.  CT maxillofacial with left mastoid effusion, but no acute facial bone fracture.  CT cervical spine with no acute osseous abnormality.  There is a straightening of the cervical spine with diffuse degenerative changes.  CT chest/abdomen/pelvis without contrast with no evidence of acute traumatic injury.  Moderate: Follow-up with normal back there is however a lower left intrarenal stone measuring 4 mm in diameter.  There is mild left hydronephrosis and hydroureter, possibly due to reflux.  The right kidney cyst asymmetrically enlarged with stranding around the right kidney.  There is moderate right hydronephrosis and hydroureter, similar to prior study.  Changes could be due to reflux, pyelonephritis, alcohol stone, or recently passed stone.  There are no arterial stones seen.  Please see images and full radiology report for further details.   ED course: Initial vital signs were temperature 98.6 F, pulse 115, respiration 18, BP 126/110 mmHg and O2 sat 96% on room air.  The patient received ceftriaxone  2 g IVPB and 1000 mL of LR bolus.  Review of Systems: As mentioned in the history of present illness. All other systems reviewed and are negative. Past Medical History:  Diagnosis Date   Arthritis    right hand,  left foot   At risk for sleep apnea    STOP-BANG = 4    SENT TO PCP [id number]   BPH (benign prostatic hypertrophy)    Coronary artery disease CARIOLOGIST-- DR BLANCA   ANGIOPLASTY TO LAD   COVID 2022   mild case   Dysrhythmia    A-fib   Elevated PSA    Frequency of urination    History of kidney stones    Hyperlipidemia  Hypertension    Nocturia    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 Cindie   Spermatocele    left   Stroke [facility])    TIA's   Urgency of urination    Vertigo    Past Surgical History:  Procedure Laterality Date   BALLOON DILATION N/A 07/22/2022    Procedure: ROSEBUD BALLOON DILATION;  Surgeon: Nieves Cough, MD;  Location: [facility];  Service: Urology;  Laterality: N/A;  60 MINS   CORONARY ANGIOPLASTY  1995  DR JORDAN   LAD   CYSTOSCOPY N/A 09/18/2015   Procedure: CYSTOSCOPY;  Surgeon: Cough Nieves, MD;  Location: [facility];  Service: Urology;  Laterality: N/A;   CYSTOSCOPY WITH RETROGRADE PYELOGRAM, URETEROSCOPY AND STENT PLACEMENT Left 03/01/2013   Procedure: CYSTOSCOPY WITH LEFT RETROGRADE PYELOGRAM, LEFT URETEROSCOPY and stent PLACEMENT;  Surgeon: Cough Gwenyth Nieves, MD;  Location: [facility];  Service: Urology;  Laterality: Left;   CYSTOSCOPY/URETEROSCOPY/HOLMIUM LASER/STENT PLACEMENT Bilateral 08/05/2024   Procedure: CYSTOSCOPY/URETEROSCOPY/HOLMIUM LASER/STENT PLACEMENT/URETHRAL DILATION;  Surgeon: Nieves Cough, MD;  Location: [facility];  Service: Urology;  Laterality: Bilateral;   EYE SURGERY Left    cataract surgery   FOOT FUSION Left    SECONDARY TO FX'S   GREEN LIGHT LASER TURP (TRANSURETHRAL RESECTION OF PROSTATE N/A 10/04/2013   Procedure: GREEN LIGHT LASER TURP (TRANSURETHRAL RESECTION OF PROSTATE;  Surgeon: Cough Gwenyth Nieves, MD;  Location: [facility];  Service: Urology;  Laterality: N/A;   INGUINAL HERNIA REPAIR Bilateral    LEFT ATRIAL APPENDAGE OCCLUSION N/A 01/09/2022   Procedure: LEFT ATRIAL APPENDAGE OCCLUSION;  Surgeon: Cindie Ole DASEN, MD;  Location: [facility];  Service: Cardiovascular;  Laterality: N/A;   LOOP RECORDER INSERTION N/A 04/20/2018   Procedure: LOOP RECORDER INSERTION;  Surgeon: Waddell Danelle ORN, MD;  Location: [facility];  Service: Cardiovascular;  Laterality: N/A;   SHOULDER ARTHROSCOPY Right 08/18/2002   SHOULDER ARTHROSCOPY WITH DEBRIDEMENT AND BICEP TENDON REPAIR Left 04/28/2011   SPERMATOCELECTOMY Left 10/04/2013   Procedure: LEFT SPERMATOCELECTOMY;  Surgeon: Cough Gwenyth Nieves, MD;  Location: [facility];  Service: Urology;  Laterality: Left;   TEE  WITHOUT CARDIOVERSION N/A 02/16/2018   Procedure: TRANSESOPHAGEAL ECHOCARDIOGRAM (TEE);  Surgeon: Mona Vinie BROCKS, MD;  Location[facility];  Service: Cardiovascular;  Laterality: N/A;   TEE WITHOUT CARDIOVERSION N/A 01/09/2022   Procedure: TRANSESOPHAGEAL ECHOCARDIOGRAM (TEE);  Surgeon: Cindie Ole DASEN, MD;  Location: [facility] INV[facility]IVE CV LAB;  Service: Cardiovascular;  Laterality: N/A;   TRANSURETHRAL RESECTION OF PROSTATE N/A 12/11/2014   Procedure: CYSTOSCOPY, CLOT EVACUATION, WITH FULGURATION ;  Surgeon: Norleen Seltzer, MD;  Location: [facility];  Service: Urology;  Laterality: N/A;   TRANSURETHRAL RESECTION OF PROSTATE N/A 09/18/2015   Procedure: TRANSURETHRAL RESECTION OF THE PROSTATE (TURP);  Surgeon: Cough Nieves, MD;  Location: [facility];  Service: Urology;  Laterality: N/A;   UMBILICAL HERNIA REPAIR  06/02/2008   UMBILICAL HERNIA REPAIR N/A 11/12/2021   Procedure: UMBILICAL HERNIA REPAIR;  Surgeon: Vernetta Berg, MD;  Location: [facility];  Service: General;  Laterality: N/A;  LMA   Social History:  reports that he quit smoking about 61 years ago. His smoking use included cigarettes. He started smoking about 64 years ago. He has a 1.5 pack-year smoking history. He has never been exposed to tobacco smoke. He has never used smokeless tobacco. He reports that he does not currently use alcohol. He reports that he does not use drugs.  Allergies[1]  History reviewed. No pertinent family history.  Prior to Admission medications  Medication Sig Start Date End Date Taking? Authorizing Provider  amLODipine  (NORVASC ) 2.5 MG tablet Take 2.5 mg by mouth every evening.   Yes [provider]  aspirin  EC 81 MG tablet Take 81 mg by mouth daily. Swallow whole.   Yes [provider]  Coenzyme Q10 (COQ-10) 200 MG CAPS Take 200 mg by mouth in the morning.   Yes [provider]  finasteride  (PROSCAR ) 5 MG tablet Take 5 mg by mouth every evening. 03/09/24  Yes [provider]   losartan  (COZAAR ) 100 MG tablet Take 1 tablet (100 mg total) by mouth daily. Patient taking differently: Take 100 mg by mouth daily in the afternoon. 02/11/24  Yes Jordan, Peter M, MD  metoprolol  tartrate (LOPRESSOR ) 25 MG tablet Take 1 tab twice daily as needed only for heart rate greater than 100 beats per minute. 05/18/23  Yes West, Katlyn D, NP  rosuvastatin  (CRESTOR ) 20 MG tablet Take 1 tablet (20 mg total) by mouth every evening. 04/15/24  Yes Jordan, Peter M, MD  tamsulosin  (FLOMAX ) 0.4 MG CAPS capsule Take 0.4 mg by mouth every evening. 08/14/20  Yes [provider]  TYLENOL  500 MG tablet Take 500 mg by mouth See admin instructions. Take 500 mg by mouth in the evening and an additional 500 mg at noontime as needed for discomfort   Yes [provider]  amLODipine  (NORVASC ) 2.5 MG tablet Take 1 tablet (2.5 mg total) by mouth daily. Patient not taking: Reported on 09/12/2024 03/29/24 09/12/24  Jordan, Peter M, MD  cephALEXin  (KEFLEX ) 500 MG capsule Take 1 capsule (500 mg total) by mouth 2 (two) times daily. starting 08/06/2024. Patient not taking: Reported on 09/12/2024 08/05/24   Nieves Cough, MD    Physical Exam: Vitals:   09/12/24 1049 09/12/24 1052 09/12/24 1500  BP:  (!) 126/110 (!) 139/57  Pulse:  (!) 115 95  Resp:  18 20  Temp:  98.6 F (37 C) 97.6 F (36.4 C)  TempSrc:  Oral Oral  SpO2: 96% 99% 95%   Physical Exam Vitals and nursing note reviewed.  Constitutional:      General: He is awake. He is not in acute distress.    Appearance: He is normal weight. He is ill-appearing.  HENT:     Head: Normocephalic.     Nose: No rhinorrhea.     Mouth/Throat:     Mouth: Mucous membranes are dry.  Eyes:     General: No scleral icterus.    Pupils: Pupils are equal, round, and reactive to light.  Neck:     Vascular: No JVD.  Cardiovascular:     Rate and Rhythm: Normal rate and regular rhythm.     Heart sounds: S1 normal and S2 normal.  Pulmonary:      Effort: Pulmonary effort is normal.     Breath sounds: Normal breath sounds. No wheezing, rhonchi or rales.  Abdominal:     General: Bowel sounds are normal. There is no distension.     Palpations: Abdomen is soft.     Tenderness: There is no abdominal tenderness. There is no right CVA tenderness or left CVA tenderness.  Musculoskeletal:     Cervical back: Neck supple.     Right lower leg: No edema.     Left lower leg: No edema.  Skin:    General: Skin is warm and dry.     Comments: Contusion laceration in  his right frontal area.  Neurological:     General: No focal deficit present.     Mental Status: He is alert and oriented to person, place, and time.  Psychiatric:        Mood and Affect: Mood normal.        Behavior: Behavior normal. Behavior is cooperative.    Data Reviewed:  Results are pending, will review when available.  08/26/2023 echocardiogram report. IMPRESSIONS:   1. Left ventricular ejection fraction, by estimation, is 50 to 55%. Left  ventricular ejection fraction by 3D volume is 54 %. The left ventricle has  low normal function. The left ventricle has no regional wall motion  abnormalities. Left ventricular  diastolic parameters were normal.   2. Right ventricular systolic function is normal. The right ventricular  size is normal. Tricuspid regurgitation signal is inadequate for assessing  PA pressure.   3. Left atrial size was mildly dilated.   4. The mitral valve is normal in structure. Mild mitral valve  regurgitation.   5. The aortic valve is tricuspid. Aortic valve regurgitation is moderate  to severe. No aortic stenosis is present.   6. The inferior vena cava is normal in size with greater than 50%  respiratory variability, suggesting right atrial pressure of 3 mmHg.   Conclusion(s)/Recommendation(s): AI appears moderate to severe, recommend  cardiac MRI to quantify AI severity.   EKG: Vent. rate 110 BPM  PR interval 182 ms  QRS duration 99 ms   QT/QTcB 347/470 ms  P-R-T axes 48 62 45  Sinus tachycardia voltage  Assessment and Plan: Principal Problem: Right kidney:   Hydronephrosis with renal and ureteral calculus obstruction Complicated by:   Sepsis secondary to UTI (HCC) Admit to PCU/inpatient. Continue IV fluids. Continue cefepime  2 g every 8 hours.   Follow-up blood culture and sensitivity Follow CBC and CMP in a.m.  Active Problems:   AKI (acute kidney injury) Continue IV fluids. Avoid hypotension. Avoid nephrotoxins. Monitor intake and output. Monitor renal function electrolytes.    Coronary artery disease Continue amlodipine , aspirin , statin and losartan .    Hypertension Continue amlodipine  2.5 mg p.o. daily.    Paroxysmal atrial fibrillation (HCC) CHA?DS?-VASc Score of at least 6. Not on anticoagulation. Rate is controlled.    Iron deficiency anemia Monitor hematocrit hemoglobin.    Hypercholesterolemia Continue rosuvastatin  20 mg p.o. daily.      Advance Care Planning:   Code Status: Full Code   Consults:   Family Communication:   Severity of Illness: The appropriate patient status for this patient is INPATIENT. Inpatient status is judged to be reasonable and necessary in order to provide the required intensity of service to ensure the patient's safety. The patient's presenting symptoms, physical exam findings, and initial radiographic and laboratory data in the context of their chronic comorbidities is felt to place them at high risk for further clinical deterioration. Furthermore, it is not anticipated that the patient will be medically stable for discharge from the hospital within 2 midnights of admission.   * I certify that at the point of admission it is my clinical judgment that the patient will require inpatient hospital care spanning beyond 2 midnights from the point of admission due to high intensity of service, high risk for further deterioration and high frequency of surveillance  required.*  Author: Alm Dorn Castor, MD 09/12/2024 4:27 PM  For on call review [email or web address].   This document was prepared using Dragon voice recognition software and may contain some unintended transcription  errors.     [1]  Allergies Allergen Reactions   Augmentin [Amoxicillin-Pot Clavulanate] Diarrhea   Codeine Nausea Only   Levaquin [Levofloxacin] Other (See Comments)    Severe stomach pain   Zocor [Simvastatin] Other (See Comments)    Muscle pain   "

## 2024-09-13 DIAGNOSIS — N390 Urinary tract infection, site not specified: Secondary | ICD-10-CM | POA: Diagnosis not present

## 2024-09-13 DIAGNOSIS — A419 Sepsis, unspecified organism: Secondary | ICD-10-CM | POA: Diagnosis not present

## 2024-09-13 LAB — CBC
HCT: 30.2 % — ABNORMAL LOW (ref 39.0–52.0)
Hemoglobin: 9.8 g/dL — ABNORMAL LOW (ref 13.0–17.0)
MCH: 30.1 pg (ref 26.0–34.0)
MCHC: 32.5 g/dL (ref 30.0–36.0)
MCV: 92.6 fL (ref 80.0–100.0)
Platelets: 201 10*3/uL (ref 150–400)
RBC: 3.26 MIL/uL — ABNORMAL LOW (ref 4.22–5.81)
RDW: 14.7 % (ref 11.5–15.5)
WBC: 18.3 10*3/uL — ABNORMAL HIGH (ref 4.0–10.5)
nRBC: 0 % (ref 0.0–0.2)

## 2024-09-13 LAB — COMPREHENSIVE METABOLIC PANEL WITH GFR
ALT: 30 U/L (ref 0–44)
AST: 69 U/L — ABNORMAL HIGH (ref 15–41)
Albumin: 3.2 g/dL — ABNORMAL LOW (ref 3.5–5.0)
Alkaline Phosphatase: 61 U/L (ref 38–126)
Anion gap: 11 (ref 5–15)
BUN: 46 mg/dL — ABNORMAL HIGH (ref 8–23)
CO2: 19 mmol/L — ABNORMAL LOW (ref 22–32)
Calcium: 9.4 mg/dL (ref 8.9–10.3)
Chloride: 107 mmol/L (ref 98–111)
Creatinine, Ser: 3.33 mg/dL — ABNORMAL HIGH (ref 0.61–1.24)
GFR, Estimated: 18 mL/min — ABNORMAL LOW
Glucose, Bld: 103 mg/dL — ABNORMAL HIGH (ref 70–99)
Potassium: 5.6 mmol/L — ABNORMAL HIGH (ref 3.5–5.1)
Sodium: 137 mmol/L (ref 135–145)
Total Bilirubin: 0.4 mg/dL (ref 0.0–1.2)
Total Protein: 6.6 g/dL (ref 6.5–8.1)

## 2024-09-13 LAB — BASIC METABOLIC PANEL WITH GFR
Anion gap: 8 (ref 5–15)
BUN: 50 mg/dL — ABNORMAL HIGH (ref 8–23)
CO2: 21 mmol/L — ABNORMAL LOW (ref 22–32)
Calcium: 9.1 mg/dL (ref 8.9–10.3)
Chloride: 107 mmol/L (ref 98–111)
Creatinine, Ser: 2.84 mg/dL — ABNORMAL HIGH (ref 0.61–1.24)
GFR, Estimated: 22 mL/min — ABNORMAL LOW
Glucose, Bld: 139 mg/dL — ABNORMAL HIGH (ref 70–99)
Potassium: 5.3 mmol/L — ABNORMAL HIGH (ref 3.5–5.1)
Sodium: 136 mmol/L (ref 135–145)

## 2024-09-13 LAB — GLUCOSE, CAPILLARY: Glucose-Capillary: 126 mg/dL — ABNORMAL HIGH (ref 70–99)

## 2024-09-13 MED ORDER — FENTANYL CITRATE (PF) 50 MCG/ML IJ SOSY
25.0000 ug | PREFILLED_SYRINGE | Freq: Once | INTRAMUSCULAR | Status: AC
  Administered 2024-09-13: 25 ug via INTRAVENOUS
  Filled 2024-09-13: qty 1

## 2024-09-13 MED ORDER — INSULIN ASPART 100 UNIT/ML IV SOLN
5.0000 [IU] | Freq: Once | INTRAVENOUS | Status: AC
  Administered 2024-09-13: 5 [IU] via INTRAVENOUS
  Filled 2024-09-13: qty 5

## 2024-09-13 MED ORDER — DEXTROSE 50 % IV SOLN
1.0000 | Freq: Once | INTRAVENOUS | Status: AC
  Administered 2024-09-13: 50 mL via INTRAVENOUS
  Filled 2024-09-13: qty 50

## 2024-09-13 MED ORDER — LACTATED RINGERS IV SOLN: INTRAVENOUS | Status: DC

## 2024-09-13 MED ORDER — SCOPOLAMINE 1 MG/3DAYS TD PT72
1.0000 | MEDICATED_PATCH | TRANSDERMAL | Status: DC
  Administered 2024-09-13 – 2024-09-16 (×2): 1 mg via TRANSDERMAL
  Filled 2024-09-13 (×2): qty 1

## 2024-09-13 MED ORDER — SODIUM ZIRCONIUM CYCLOSILICATE 10 G PO PACK
10.0000 g | PACK | Freq: Once | ORAL | Status: AC
  Administered 2024-09-13: 10 g via ORAL
  Filled 2024-09-13: qty 1

## 2024-09-13 MED ORDER — CHLORHEXIDINE GLUCONATE CLOTH 2 % EX PADS
6.0000 | MEDICATED_PAD | Freq: Every day | CUTANEOUS | Status: DC
  Administered 2024-09-13 – 2024-09-17 (×5): 6 via TOPICAL

## 2024-09-13 NOTE — Plan of Care (Signed)
" °  Problem: Education: Goal: Knowledge of General Education information will improve Description: Including pain rating scale, medication(s)/side effects and non-pharmacologic comfort measures Outcome: Progressing   Problem: Clinical Measurements: Goal: Ability to maintain clinical measurements within normal limits will improve Outcome: Progressing Goal: Will remain free from infection Outcome: Progressing Goal: Diagnostic test results will improve Outcome: Progressing   Problem: Activity: Goal: Risk for activity intolerance will decrease Outcome: Progressing   Problem: Nutrition: Goal: Adequate nutrition will be maintained Outcome: Progressing   Problem: Coping: Goal: Level of anxiety will decrease Outcome: Progressing   Problem: Elimination: Goal: Will not experience complications related to urinary retention Outcome: Progressing   Problem: Pain Managment: Goal: General experience of comfort will improve and/or be controlled Outcome: Progressing   Problem: Safety: Goal: Ability to remain free from injury will improve Outcome: Progressing   Problem: Skin Integrity: Goal: Risk for impaired skin integrity will decrease Outcome: Progressing   "

## 2024-09-13 NOTE — Progress Notes (Addendum)
 " PROGRESS NOTE    Edward [name]  FMW:[id number] DOB: 01-27-1941 DOA: 09/12/2024 PCP: Janey Santos, MD  Outpatient Specialists: urology    Brief Narrative:   Edward [name] is a 84 y.o. male with medical history significant of osteoarthritis, sleep apnea, BPH, hyperlipidemia, hypertension, nocturia, history of TIA, history of vertigo, history of spermatocele he CAD, COVID-19, paroxysmal atrial fibrillation, history of Watchman left atrial appendage closure device, elevated PSA, nephrolithiasis who was brought via EMS to the emergency department after falling off the toilet, hitting his head on the right side and sustaining laceration.  Last month, he underwent a cystoscopy with ureteral dilatation, bilateral ureteroscopy laser lithotripsy and ureteral stent placement, Foley catheter. He denied fever, chills, rhinorrhea, sore throat, wheezing or hemoptysis.  Occasional chest discomfort with palpitations for which he takes as needed metoprolo.  He denied diaphoresis, PND, orthopnea or pitting edema of the lower extremities. No abdominal pain, nausea, emesis, diarrhea, constipation, melena or hematochezia.  Mild flank pain, but no dysuria, frequency or hematuria. No polyuria, polydipsia, polyphagia or blurred vision.    Assessment & Plan:   Principal Problem:   Sepsis secondary to UTI [facility]) Active Problems:   Coronary artery disease   Hypercholesterolemia   Hypertension   Paroxysmal atrial fibrillation (HCC)   Iron deficiency anemia   Hydronephrosis with renal and ureteral calculus obstruction   AKI (acute kidney injury)  # AKI Likely 2/2 obstruction. Cr 3.3 was 1.44 last month - insert foley - urology to see - continue fluids  # Obstructive uropathy # BPH # Urethral stricture # Nephrolithiasis Last month had Cystoscopy with urethral dilation, bilateral ureteroscopy laser lithotripsy and ureteral stent placement, Foley catheter placement. Didn't tolerate foley. Here CT shows  left hydro, right hydro. - urology advises foley, ordered - npo at midnight, may need cystoscopy with intervention - cont home flomax , finasteride   # Fall Abrasion right scalp. CT head/neck negative. No other apparent injury.  # Complicated uti Right perinephric stranding though no right flank pain, culture growing klebsiella, has leukocytosis as well - continue cefepime  - f/u sensitivities  # Hyperkalemia 2/2 aki - lokelma , insulin , EKG - foley - repeat bmp - tele  # HTN BPs low normal - hold home antihypertensives  # A-fib Rate controlled, is on prn metop at home. Doesn't appear to be anticoagulated   DVT prophylaxis: SCDS pending possible procedure tomorrow Code Status: full Family Communication: wife telephonically 1/27  Level of care: Telemetry Status is: Inpatient Remains inpatient appropriate because: severity of illness   Consultants:  urology  Procedures: pending  Antimicrobials:  cefepime     Subjective: Reports urinary frequency otherwise feeling fine  Objective: Vitals:   09/12/24 2153 09/13/24 0200 09/13/24 0300 09/13/24 1130  BP: (!) 144/56 (!) 122/50 (!) 128/58 (!) 108/50  Pulse: 87 72 70 68  Resp: 18 20 18 16   Temp: 98.2 F (36.8 C) (!) 97.5 F (36.4 C) (!) 97.2 F (36.2 C) 98.5 F (36.9 C)  TempSrc: Oral Oral Oral Oral  SpO2:      Weight:        Intake/Output Summary (Last 24 hours) at 09/13/2024 1550 Last data filed at 09/13/2024 1429 Gross per 24 hour  Intake 2006.61 ml  Output 151 ml  Net 1855.61 ml   Filed Weights   09/12/24 1600  Weight: 67.1 kg    Examination:  General exam: Appears calm and comfortable  Respiratory system: Clear to auscultation. Respiratory effort normal. Cardiovascular system: S1 & S2 heard, RR  Gastrointestinal system: Abdomen is nondistended, soft and nontender. Mild supraputic Central nervous system: Alert and oriented. No focal neurological deficits. Extremities: Symmetric 5 x 5  power. Skin: No rashes, lesions or ulcers Psychiatry: Judgement and insight appear normal. Mood & affect appropriate.     Data Reviewed: I have personally reviewed following labs and imaging studies  CBC: Recent Labs  Lab 09/12/24 1440 09/13/24 0546  WBC 20.4* 18.3*  NEUTROABS 19.4*  --   HGB 10.8* 9.8*  HCT 33.9* 30.2*  MCV 93.9 92.6  PLT 277 201   Basic Metabolic Panel: Recent Labs  Lab 09/12/24 1440 09/13/24 0546  NA 142 137  K 5.1 5.6*  CL 113* 107  CO2 17* 19*  GLUCOSE 94 103*  BUN 41* 46*  CREATININE 2.89* 3.33*  CALCIUM  9.2 9.4   GFR: Estimated Creatinine Clearance: 15.7 mL/min (A) (by C-G formula based on SCr of 3.33 mg/dL (H)). Liver Function Tests: Recent Labs  Lab 09/12/24 1440 09/13/24 0546  AST 25 69*  ALT 22 30  ALKPHOS 65 61  BILITOT 0.3 0.4  PROT 6.7 6.6  ALBUMIN 3.4* 3.2*   Recent Labs  Lab 09/12/24 1440  LIPASE 27   No results for input(s): AMMONIA in the last 168 hours. Coagulation Profile: No results for input(s): INR, PROTIME in the last 168 hours. Cardiac Enzymes: No results for input(s): CKTOTAL, CKMB, CKMBINDEX, TROPONINI in the last 168 hours. BNP (last 3 results) No results for input(s): PROBNP in the last 8760 hours. HbA1C: No results for input(s): HGBA1C in the last 72 hours. CBG: No results for input(s): GLUCAP in the last 168 hours. Lipid Profile: No results for input(s): CHOL, HDL, LDLCALC, TRIG, CHOLHDL, LDLDIRECT in the last 72 hours. Thyroid Function Tests: No results for input(s): TSH, T4TOTAL, FREET4, T3FREE, THYROIDAB in the last 72 hours. Anemia Panel: No results for input(s): VITAMINB12, FOLATE, FERRITIN, TIBC, IRON, RETICCTPCT in the last 72 hours. Urine analysis:    Component Value Date/Time   COLORURINE YELLOW 09/12/2024 1343   APPEARANCEUR CLOUDY (A) 09/12/2024 1343   LABSPEC 1.012 09/12/2024 1343   PHURINE 5.0 09/12/2024 1343   GLUCOSEU  NEGATIVE 09/12/2024 1343   HGBUR SMALL (A) 09/12/2024 1343   BILIRUBINUR NEGATIVE 09/12/2024 1343   KETONESUR NEGATIVE 09/12/2024 1343   PROTEINUR NEGATIVE 09/12/2024 1343   UROBILINOGEN 1.0 12/10/2014 2305   NITRITE POSITIVE (A) 09/12/2024 1343   LEUKOCYTESUR LARGE (A) 09/12/2024 1343   Sepsis Labs: @LABRCNTIP (procalcitonin:4,lacticidven:4)  ) Recent Results (from the past 240 hours)  Culture, blood (routine x 2)     Status: None (Preliminary result)   Collection Time: 09/12/24  4:10 PM   Specimen: BLOOD  Result Value Ref Range Status   Specimen Description   Final    BLOOD RIGHT ANTECUBITAL Performed at [facility], [address] [place], [place] [address]    Special Requests   Final    BOTTLES DRAWN AEROBIC AND ANAEROBIC Blood Culture adequate volume Performed at [facility], [address], [place], [place] [address]    Culture   Final    NO GROWTH < 24 HOURS Performed at [facility], [address], [place], [place] [address]    Report Status PENDING  Incomplete  Culture, blood (routine x 2)     Status: None (Preliminary result)   Collection Time: 09/12/24  4:10 PM   Specimen: BLOOD  Result Value Ref Range Status   Specimen Description   Final    BLOOD  LEFT ANTECUBITAL Performed at [facility], [address] [place] [address], [place], [place] [address]    Special Requests   Final    BOTTLES DRAWN AEROBIC AND ANAEROBIC Blood Culture adequate volume Performed at [facility], [address], [place], [place] [address]    Culture   Final    NO GROWTH < 24 HOURS Performed at [facility], [address] [place], [place] [address]    Report Status PENDING  Incomplete  Urine Culture     Status: Abnormal (Preliminary result)   Collection Time: 09/12/24  5:05 PM   Specimen: Urine, Clean Catch  Result Value Ref Range Status   Specimen Description   Final    URINE, CLEAN CATCH Performed at  [facility], [address] [place], [place] [address]    Special Requests   Final    NONE Performed at [facility], [address] [place], [place] [address]    Culture (A)  Final    >=100,000 COLONIES/mL KLEBSIELLA PNEUMONIAE SUSCEPTIBILITIES TO FOLLOW Performed at [facility], [address], [place], [place] [address]    Report Status PENDING  Incomplete         Radiology Studies: CT CHEST ABDOMEN PELVIS WO CONTRAST Result Date: 09/12/2024 EXAM: CT CHEST, ABDOMEN AND PELVIS WITHOUT CONTRAST 09/12/2024 03:45:11 PM TECHNIQUE: CT of the chest, abdomen and pelvis was performed without the administration of intravenous contrast. Multiplanar reformatted images are provided for review. Automated exposure control, iterative reconstruction, and/or weight based adjustment of the mA/kV was utilized to reduce the radiation dose to as low as reasonably achievable. COMPARISON: CT abdomen and pelvis 09/02/2024, cardiac CT 03/10/2022, chest radiograph 05/06/2023. CLINICAL HISTORY: Polytrauma, blunt. Patient fell, striking the right side. Dizziness. FINDINGS: CHEST: MEDIASTINUM AND LYMPH NODES: Heart: Normal heart size. Cage prosthesis in the left atrial appendage. Calcification in the coronary arteries. No pericardial effusions. Vessels: Calcification in the aorta. Normal caliber thoracic aorta. Airways: The central airways are clear. Lymph Nodes: No mediastinal, hilar or axillary lymphadenopathy. Other: The thyroid gland is unremarkable. The esophagus is decompressed. There are no mediastinal fluid collections. LUNGS AND PLEURA: Lungs: Mild dependent atelectasis in the lung bases. No airspace disease or consolidation in the lungs. Pleura: No pleural effusion or pneumothorax. ABDOMEN AND PELVIS: LIVER: Unremarkable. GALLBLADDER AND BILE DUCTS: Unremarkable. No biliary ductal dilatation. SPLEEN: No acute abnormality. PANCREAS: No acute abnormality. ADRENAL  GLANDS: No adrenal gland nodules. KIDNEYS, URETERS AND BLADDER: Kidneys: Lower pole left intrarenal stone measuring 4 mm diameter. Mild left hydronephrosis and hydroureter, possibly due to reflux. The right kidney is asymmetrically enlarged with stranding around the right kidney. Moderate right hydronephrosis and hydroureter, similar to prior study. Changes could be due to reflux, pyelonephritis, occult stone, or recently passed stone. Ureters: No ureteral stones are demonstrated. Bladder: Mild cellular formation of the bladder wall may indicate evidence of outlet obstruction. No discrete wall thickening, stone, or filling defect in the bladder. Prostate: The prostate gland is enlarged. The posterior urethra appears mildly dilated. GI AND BOWEL: Stomach: Stomach demonstrates no acute abnormality. Bowel: The small bowel and colon are not abnormally distended. No wall thickening or inflammatory stranding is appreciated. Scattered colonic diverticula without evidence of acute diverticulitis. The appendix is not seen. There is no bowel obstruction. REPRODUCTIVE ORGANS: The prostate gland is enlarged. The posterior urethra appears mildly dilated. PERITONEUM AND RETROPERITONEUM: Fluid/Air: No free air or free fluid is present. Hernia: Minimal periumbilical hernia containing fat.  Vascular: Calcification of the abdominal aorta. No aneurysm. Lymph Nodes: No retroperitoneal lymphadenopathy. VASCULATURE: Calcification of the abdominal aorta. No aneurysm. ABDOMINAL AND PELVIS LYMPH NODES: No pelvic lymphadenopathy. No retroperitoneal lymphadenopathy. BONES AND SOFT TISSUES: Bones: Degenerative changes in the spine and hips. No acute bony abnormalities. Soft Tissues: No focal soft tissue abnormality. IMPRESSION: 1. No evidence of acute traumatic injury. Electronically signed by: Elsie Gravely MD 09/12/2024 04:08 PM EST RP Workstation: [id number]   CT Maxillofacial Wo Contrast Result Date: 09/12/2024 CLINICAL DATA:  Blunt  facial trauma off toilet EXAM: CT MAXILLOFACIAL WITHOUT CONTRAST TECHNIQUE: Multidetector CT imaging of the maxillofacial structures was performed. Multiplanar CT image reconstructions were also generated. RADIATION DOSE REDUCTION: This exam was performed according to the departmental dose-optimization program which includes automated exposure control, adjustment of the mA and/or kV according to patient size and/or use of iterative reconstruction technique. COMPARISON:  None recent FINDINGS: Osseous: Fluid within the left mastoid air cells. Mandibular heads are normally position. No mandibular fracture. Pterygoid plates and zygomatic arches are intact. No acute nasal bone fracture. Orbits: Negative. No traumatic or inflammatory finding. Sinuses: Mucous retention cysts and mucosal thickening. No fluid levels. No sinus wall fracture Soft tissues: Negative. Limited intracranial: See separately dictated head CT IMPRESSION: 1. No CT evidence for acute facial bone fracture. 2. Left mastoid effusion Electronically Signed   By: Luke Bun M.D.   On: 09/12/2024 16:05   CT Cervical Spine Wo Contrast Result Date: 09/12/2024 CLINICAL DATA:  Toilet EXAM: CT CERVICAL SPINE WITHOUT CONTRAST TECHNIQUE: Multidetector CT imaging of the cervical spine was performed without intravenous contrast. Multiplanar CT image reconstructions were also generated. RADIATION DOSE REDUCTION: This exam was performed according to the departmental dose-optimization program which includes automated exposure control, adjustment of the mA and/or kV according to patient size and/or use of iterative reconstruction technique. COMPARISON:  None Available. FINDINGS: Alignment: Straightening of the cervical spine. No subluxation. Facet is normal Skull base and vertebrae: No acute fracture. No primary bone lesion or focal pathologic process. Soft tissues and spinal canal: No prevertebral fluid or swelling. No visible canal hematoma. Disc levels: Diffuse  degenerative changes. Mild to moderate diffuse disc space narrowing C3 through C7. Multilevel facet degenerative change with foraminal narrowing. Advanced C1-C2 degenerative change with prominent partially calcified pannus. Narrowing of the canal at the C1-C2 level due to calcified pannus. Posterior disc osteophyte at multiple levels, results in at least mild canal stenosis at C3-C4, C4-C5, moderate stenosis at C5-C6, and moderate severe canal stenosis at C6-C7. Upper chest: Lung apices are clear Other: None IMPRESSION: 1. Straightening of the cervical spine with diffuse degenerative changes. No acute osseous abnormality. Electronically Signed   By: Luke Bun M.D.   On: 09/12/2024 16:02   CT Head Wo Contrast Result Date: 09/12/2024 CLINICAL DATA:  Head trauma fell off toilet EXAM: CT HEAD WITHOUT CONTRAST TECHNIQUE: Contiguous axial images were obtained from the base of the skull through the vertex without intravenous contrast. RADIATION DOSE REDUCTION: This exam was performed according to the departmental dose-optimization program which includes automated exposure control, adjustment of the mA and/or kV according to patient size and/or use of iterative reconstruction technique. COMPARISON:  MRI brain 10/30/2020, CT head 01/15/2018 FINDINGS: Brain: No acute territorial infarction, hemorrhage or intracranial mass. 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 The ventricles are nonenlarged Vascular: No hyperdense vessels.  No unexpected calcification Skull: Normal. Negative for fracture or focal lesion. Sinuses/Orbits: No acute finding. Other: None IMPRESSION: 1.  No CT evidence for acute intracranial abnormality. 2. 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 Electronically Signed   By: Luke Bun M.D.   On: 09/12/2024 15:54        Scheduled Meds:  amLODipine   2.5 mg Oral QPM   aspirin  EC  81 mg Oral Daily   Chlorhexidine  Gluconate Cloth  6 each  Topical Daily   insulin  aspart  5 Units Intravenous Once   And   dextrose   1 ampule Intravenous Once   finasteride   5 mg Oral QPM   rosuvastatin   20 mg Oral QPM   sodium zirconium cyclosilicate   10 g Oral Once   tamsulosin   0.4 mg Oral QPM   Continuous Infusions:  ceFEPime  (MAXIPIME ) IV Stopped (09/12/24 1824)     LOS: 1 day     Devaughn KATHEE Ban, MD [facility]   If 7PM-7AM, please contact night-coverage [email or web address] Password [email or web address] 09/13/2024, 3:50 PM     "

## 2024-09-13 NOTE — Consult Note (Signed)
 H&P Physician requesting consult: Devaughn Ban  CC: Hydronephrosis HPI: 09/13/2024 84 year old male status post bilateral ureteroscopy and urethral dilation.  Patient presented after experiencing a fall.  Is noted to have leukocytosis of 20 and a creatinine of 2.89.  Creatinine worsened and was 3.3 and then on recheck was 2.84.  CT scan was performed that showed mildly distended bladder and bilateral hydroureteronephrosis, right greater than left.  Patient has been having urinary frequency.  Urine culture positive for Klebsiella.  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  He did have to have a mild dilation of the urethra with sounds during his last procedure.  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 Cindie   Spermatocele    left   Stroke [facility])    TIA's   Urgency of urination    Vertigo    Past Surgical History:  Procedure Laterality Date   BALLOON DILATION N/A 07/22/2022   Procedure: ROSEBUD BALLOON DILATION;  Surgeon: Nieves Cough, MD;  Location: [facility];  Service: Urology;  Laterality: N/A;  60 MINS   CORONARY ANGIOPLASTY  1995  DR JORDAN   LAD   CYSTOSCOPY N/A 09/18/2015   Procedure: CYSTOSCOPY;  Surgeon: Cough Nieves, MD;  Location: [facility];  Service: Urology;  Laterality: N/A;   CYSTOSCOPY WITH RETROGRADE PYELOGRAM, URETEROSCOPY AND STENT PLACEMENT Left 03/01/2013   Procedure: CYSTOSCOPY WITH LEFT RETROGRADE PYELOGRAM, LEFT URETEROSCOPY and stent  PLACEMENT;  Surgeon: Cough Gwenyth Nieves, MD;  Location: [facility];  Service: Urology;  Laterality: Left;   CYSTOSCOPY/URETEROSCOPY/HOLMIUM LASER/STENT PLACEMENT Bilateral 08/05/2024   Procedure: CYSTOSCOPY/URETEROSCOPY/HOLMIUM LASER/STENT PLACEMENT/URETHRAL DILATION;  Surgeon: Nieves Cough, MD;  Location: [facility];  Service: Urology;  Laterality: Bilateral;   EYE SURGERY Left    cataract surgery   FOOT FUSION Left    SECONDARY TO FX'S   GREEN LIGHT LASER TURP (TRANSURETHRAL RESECTION OF PROSTATE N/A 10/04/2013   Procedure: GREEN LIGHT LASER TURP (TRANSURETHRAL RESECTION OF PROSTATE;  Surgeon: Cough Gwenyth Nieves, MD;  Location: [facility];  Service: Urology;  Laterality: N/A;   INGUINAL HERNIA REPAIR Bilateral    LEFT ATRIAL APPENDAGE OCCLUSION N/A 01/09/2022   Procedure: LEFT ATRIAL APPENDAGE OCCLUSION;  Surgeon: Cindie Ole DASEN, MD;  Location: [facility];  Service: Cardiovascular;  Laterality: N/A;   LOOP RECORDER INSERTION N/A 04/20/2018   Procedure: LOOP RECORDER INSERTION;  Surgeon: Waddell Danelle ORN, MD;  Location: [facility];  Service: Cardiovascular;  Laterality: N/A;   SHOULDER ARTHROSCOPY Right 08/18/2002   SHOULDER ARTHROSCOPY WITH DEBRIDEMENT AND BICEP TENDON REPAIR Left 04/28/2011   SPERMATOCELECTOMY Left 10/04/2013   Procedure: LEFT SPERMATOCELECTOMY;  Surgeon: Cough Gwenyth Nieves, MD;  Location: [facility];  Service: Urology;  Laterality: Left;   TEE WITHOUT CARDIOVERSION N/A 02/16/2018   Procedure: TRANSESOPHAGEAL ECHOCARDIOGRAM (TEE);  Surgeon: Mona Vinie BROCKS, MD;  [facility];  Service: Cardiovascular;  Laterality: N/A;   TEE WITHOUT CARDIOVERSION N/A 01/09/2022   Procedure: TRANSESOPHAGEAL ECHOCARDIOGRAM (TEE);  Surgeon: Cindie Ole DASEN, MD;  [facility]: [facility] INVASIVE CV LAB;  Service: Cardiovascular;  Laterality: N/A;   TRANSURETHRAL RESECTION OF PROSTATE N/A 12/11/2014   Procedure: CYSTOSCOPY, CLOT EVACUATION, WITH  FULGURATION ;  Surgeon: Norleen Seltzer, MD;  Location: [facility];  Service: Urology;  Laterality: N/A;   TRANSURETHRAL RESECTION OF PROSTATE N/A 09/18/2015   Procedure: TRANSURETHRAL RESECTION OF THE PROSTATE (TURP);  Surgeon: [name], MD;  Location: [facility];  Service: Urology;  Laterality: N/A;   UMBILICAL HERNIA REPAIR  06/02/2008   UMBILICAL HERNIA REPAIR N/A 11/12/2021   Procedure: UMBILICAL HERNIA REPAIR;  Surgeon: Vernetta Berg, MD;  Location: [facility];  Service: General;  Laterality: N/A;  LMA    Home Medications:  Medications Prior to Admission  Medication Sig Dispense Refill Last Dose/Taking   amLODipine  (NORVASC ) 2.5 MG tablet Take 2.5 mg by mouth every evening.   09/11/2024 Evening   aspirin  EC 81 MG tablet Take 81 mg by mouth daily. Swallow whole.   09/12/2024 at  6:00 AM   Coenzyme Q10 (COQ-10) 200 MG CAPS Take 200 mg by mouth in the morning.   09/12/2024 Morning   finasteride  (PROSCAR ) 5 MG tablet Take 5 mg by mouth every evening.   09/11/2024 Evening   losartan  (COZAAR ) 100 MG tablet Take 1 tablet (100 mg total) by mouth daily. (Patient taking differently: Take 100 mg by mouth daily in the afternoon.) 90 tablet 2 09/11/2024 Noon   metoprolol  tartrate (LOPRESSOR ) 25 MG tablet Take 1 tab twice daily as needed only for heart rate greater than 100 beats per minute. 180 tablet 3 Unknown   rosuvastatin  (CRESTOR ) 20 MG tablet Take 1 tablet (20 mg total) by mouth every evening. 90 tablet 3 09/11/2024 Evening   tamsulosin  (FLOMAX ) 0.4 MG CAPS capsule Take 0.4 mg by mouth every evening.   09/11/2024 Evening   TYLENOL  500 MG tablet Take 500 mg by mouth See admin instructions. Take 500 mg by mouth in the evening and an additional 500 mg at noontime as needed for discomfort   09/11/2024 Evening   amLODipine  (NORVASC ) 2.5 MG tablet Take 1 tablet (2.5 mg total) by mouth daily. (Patient not taking: Reported on 09/12/2024) 180 tablet 3 Not Taking   cephALEXin  (KEFLEX ) 500 MG capsule Take 1 capsule (500  mg total) by mouth 2 (two) times daily. starting 08/06/2024. (Patient not taking: Reported on 09/12/2024) 10 capsule 0 Not Taking   Allergies: Allergies[1]  History reviewed. No pertinent family history. Social History:  reports that he quit smoking about 61 years ago. His smoking use included cigarettes. He started smoking about 64 years ago. He has a 1.5 pack-year smoking history. He has never been exposed to tobacco smoke. He has never used smokeless tobacco. He reports that he does not currently use alcohol. He reports that he does not use drugs.  ROS: A complete review of systems was performed.  All systems are negative except for pertinent findings as noted. ROS   Physical Exam:  Vital signs in last 24 hours: Temp:  [97.2 F (36.2 C)-98.5 F (36.9 C)] 98.5 F (36.9 C) (01/27 1130) Pulse Rate:  [68-92] 68 (01/27 1130) Resp:  [16-20] 16 (01/27 1130) BP: (108-144)/(50-70) 108/50 (01/27 1130) SpO2:  [97 %] 97 % (01/26 1755) General:  Alert and oriented, No acute distress HEENT: Normocephalic, atraumatic  Neck: No JVD or lymphadenopathy Cardiovascular: Regular rate and rhythm Lungs: Regular rate and effort Abdomen: Soft, nontender, nondistended, no abdominal masses Back: No CVA tenderness Extremities: No edema Neurologic: Grossly intact  Laboratory Data:  Results for orders placed or performed during the hospital encounter of 09/12/24 (from the past 24 hours)  CBC     Status: Abnormal   Collection Time: 09/13/24  5:46 AM  Result Value Ref Range   WBC 18.3 (H) 4.0 - 10.5 K/uL   RBC 3.26 (L) 4.22 - 5.81 MIL/uL   Hemoglobin 9.8 (L) 13.0 - 17.0 g/dL   HCT 69.7 (L) 60.9 - 47.9 %   MCV 92.6 80.0 - 100.0 fL   MCH 30.1 26.0 - 34.0 pg   MCHC 32.5 30.0 - 36.0 g/dL   RDW 85.2 88.4 - 84.4 %   Platelets 201 150 - 400 K/uL   nRBC 0.0 0.0 - 0.2 %  Comprehensive metabolic panel     Status: Abnormal   Collection Time: 09/13/24  5:46 AM  Result Value Ref Range   Sodium 137 135 - 145  mmol/L   Potassium 5.6 (H) 3.5 - 5.1 mmol/L   Chloride 107 98 - 111 mmol/L   CO2 19 (L) 22 - 32 mmol/L   Glucose, Bld 103 (H) 70 - 99 mg/dL   BUN 46 (H) 8 - 23 mg/dL   Creatinine, Ser 6.66 (H) 0.61 - 1.24 mg/dL   Calcium  9.4 8.9 - 10.3 mg/dL   Total Protein 6.6 6.5 - 8.1 g/dL   Albumin 3.2 (L) 3.5 - 5.0 g/dL   AST 69 (H) 15 - 41 U/L   ALT 30 0 - 44 U/L   Alkaline Phosphatase 61 38 - 126 U/L   Total Bilirubin 0.4 0.0 - 1.2 mg/dL   GFR, Estimated 18 (L) >60 mL/min   Anion gap 11 5 - 15  Basic metabolic panel with GFR     Status: Abnormal   Collection Time: 09/13/24  3:59 PM  Result Value Ref Range   Sodium 136 135 - 145 mmol/L   Potassium 5.3 (H) 3.5 - 5.1 mmol/L   Chloride 107 98 - 111 mmol/L   CO2 21 (L) 22 - 32 mmol/L   Glucose, Bld 139 (H) 70 - 99 mg/dL   BUN 50 (H) 8 - 23 mg/dL   Creatinine, Ser 7.15 (H) 0.61 - 1.24 mg/dL   Calcium  9.1 8.9 - 10.3 mg/dL   GFR, Estimated 22 (L) >60 mL/min   Anion gap 8 5 - 15  Glucose, capillary     Status: Abnormal   Collection Time: 09/13/24  4:57 PM  Result Value Ref Range   Glucose-Capillary 126 (H) 70 - 99 mg/dL   Recent Results (from the past 240 hours)  Culture, blood (routine x 2)     Status: None (Preliminary result)   Collection Time: 09/12/24  4:10 PM   Specimen: BLOOD  Result Value Ref Range Status   Specimen Description   Final    BLOOD RIGHT ANTECUBITAL Performed at [facility], [address] [place], [place] [address]    Special Requests   Final    BOTTLES DRAWN AEROBIC AND ANAEROBIC Blood Culture adequate volume Performed at [facility], [address], [place], [place] [address]    Culture   Final    NO GROWTH < 24 HOURS Performed at [facility], [address] [place], [place] [address]    Report Status PENDING  Incomplete  Culture, blood (routine x 2)     Status: None (Preliminary result)   Collection Time: 09/12/24  4:10 PM   Specimen: BLOOD  Result Value Ref  Range Status   Specimen Description   Final    BLOOD LEFT ANTECUBITAL Performed at [facility], [address] [place], [place] [address]    Special Requests   Final    BOTTLES DRAWN AEROBIC AND ANAEROBIC Blood Culture adequate volume Performed at [facility], [address], [place], [place] [address]    Culture   Final    NO GROWTH < 24 HOURS Performed at [facility], [address], [place], [place] [address]    Report Status PENDING  Incomplete  Urine Culture     Status: Abnormal (Preliminary result)   Collection Time: 09/12/24  5:05 PM   Specimen: Urine, Clean Catch  Result Value Ref Range Status   Specimen Description   Final    URINE, CLEAN CATCH Performed at [facility], [address] [place], [place] [address]    Special Requests   Final    NONE Performed at [facility], [address], [place], [place] [address]    Culture (A)  Final    >=100,000 COLONIES/mL KLEBSIELLA PNEUMONIAE SUSCEPTIBILITIES TO FOLLOW Performed at [facility], [address], [place], [place] [address]    Report Status PENDING  Incomplete   Creatinine: Recent Labs    09/12/24 1440 09/13/24 0546 09/13/24 1559  CREATININE 2.89* 3.33* 2.84*   Procedure: Simple Foley catheterization Under sterile conditions, I advanced a 16 French silicone catheter into the urethra.  There was a mild amount of resistance at the pendulous urethra consistent with known mild stricture but ultimately catheter was placed easily without issue.  There was return of clear yellow urine.  Impression/Assessment:  Bilateral hydronephrosis Urethral stricture BPH Urinary retention  Plan:  Continue Foley catheter and monitor creatinine.  Consider renal ultrasound and 24 to 48 hours to reassess hydronephrosis.  I will notify Dr. Nieves of his admission.  Sherwood JONETTA Edison, III 09/13/2024, 5:36 PM      [1]   Allergies Allergen Reactions   Augmentin [Amoxicillin-Pot Clavulanate] Diarrhea   Codeine Nausea Only   Levaquin [Levofloxacin] Other (See Comments)    Severe stomach pain   Zocor [Simvastatin] Other (See Comments)    Muscle pain

## 2024-09-14 DIAGNOSIS — N390 Urinary tract infection, site not specified: Secondary | ICD-10-CM | POA: Diagnosis not present

## 2024-09-14 DIAGNOSIS — A419 Sepsis, unspecified organism: Secondary | ICD-10-CM | POA: Diagnosis not present

## 2024-09-14 DIAGNOSIS — N132 Hydronephrosis with renal and ureteral calculous obstruction: Secondary | ICD-10-CM

## 2024-09-14 DIAGNOSIS — I10 Essential (primary) hypertension: Secondary | ICD-10-CM | POA: Diagnosis not present

## 2024-09-14 DIAGNOSIS — N179 Acute kidney failure, unspecified: Secondary | ICD-10-CM

## 2024-09-14 DIAGNOSIS — I480 Paroxysmal atrial fibrillation: Secondary | ICD-10-CM | POA: Diagnosis not present

## 2024-09-14 DIAGNOSIS — W19XXXA Unspecified fall, initial encounter: Secondary | ICD-10-CM | POA: Diagnosis not present

## 2024-09-14 DIAGNOSIS — E7800 Pure hypercholesterolemia, unspecified: Secondary | ICD-10-CM | POA: Diagnosis not present

## 2024-09-14 DIAGNOSIS — R55 Syncope and collapse: Secondary | ICD-10-CM | POA: Diagnosis present

## 2024-09-14 DIAGNOSIS — N3001 Acute cystitis with hematuria: Secondary | ICD-10-CM | POA: Insufficient documentation

## 2024-09-14 LAB — BASIC METABOLIC PANEL WITH GFR
Anion gap: 8 (ref 5–15)
BUN: 47 mg/dL — ABNORMAL HIGH (ref 8–23)
CO2: 23 mmol/L (ref 22–32)
Calcium: 9 mg/dL (ref 8.9–10.3)
Chloride: 111 mmol/L (ref 98–111)
Creatinine, Ser: 2.33 mg/dL — ABNORMAL HIGH (ref 0.61–1.24)
GFR, Estimated: 27 mL/min — ABNORMAL LOW
Glucose, Bld: 97 mg/dL (ref 70–99)
Potassium: 4.6 mmol/L (ref 3.5–5.1)
Sodium: 142 mmol/L (ref 135–145)

## 2024-09-14 LAB — CBC WITH DIFFERENTIAL/PLATELET
Abs Immature Granulocytes: 0.13 10*3/uL — ABNORMAL HIGH (ref 0.00–0.07)
Basophils Absolute: 0.1 10*3/uL (ref 0.0–0.1)
Basophils Relative: 0 %
Eosinophils Absolute: 0.2 10*3/uL (ref 0.0–0.5)
Eosinophils Relative: 2 %
HCT: 26.6 % — ABNORMAL LOW (ref 39.0–52.0)
Hemoglobin: 8.6 g/dL — ABNORMAL LOW (ref 13.0–17.0)
Immature Granulocytes: 1 %
Lymphocytes Relative: 6 %
Lymphs Abs: 0.8 10*3/uL (ref 0.7–4.0)
MCH: 30 pg (ref 26.0–34.0)
MCHC: 32.3 g/dL (ref 30.0–36.0)
MCV: 92.7 fL (ref 80.0–100.0)
Monocytes Absolute: 0.6 10*3/uL (ref 0.1–1.0)
Monocytes Relative: 4 %
Neutro Abs: 12 10*3/uL — ABNORMAL HIGH (ref 1.7–7.7)
Neutrophils Relative %: 87 %
Platelets: 174 10*3/uL (ref 150–400)
RBC: 2.87 MIL/uL — ABNORMAL LOW (ref 4.22–5.81)
RDW: 14.8 % (ref 11.5–15.5)
WBC: 13.7 10*3/uL — ABNORMAL HIGH (ref 4.0–10.5)
nRBC: 0 % (ref 0.0–0.2)

## 2024-09-14 LAB — URINE CULTURE: Culture: >=100000 — AB

## 2024-09-14 MED ORDER — ASPIRIN 81 MG PO TBEC
81.0000 mg | DELAYED_RELEASE_TABLET | Freq: Every day | ORAL | Status: DC
  Administered 2024-09-14 – 2024-09-17 (×4): 81 mg via ORAL
  Filled 2024-09-14 (×4): qty 1

## 2024-09-14 MED ORDER — LACTATED RINGERS IV SOLN: INTRAVENOUS | Status: DC

## 2024-09-14 MED ORDER — CEFAZOLIN SODIUM-DEXTROSE 1-4 GM/50ML-% IV SOLN
1.0000 g | Freq: Two times a day (BID) | INTRAVENOUS | Status: DC
  Administered 2024-09-14 – 2024-09-16 (×4): 1 g via INTRAVENOUS
  Filled 2024-09-14 (×5): qty 50

## 2024-09-14 MED ORDER — AMLODIPINE BESYLATE 5 MG PO TABS
2.5000 mg | ORAL_TABLET | Freq: Every day | ORAL | Status: DC
  Administered 2024-09-14 – 2024-09-17 (×4): 2.5 mg via ORAL
  Filled 2024-09-14 (×4): qty 1

## 2024-09-14 NOTE — Progress Notes (Signed)
 " PROGRESS NOTE    Edward [name]  FMW:[id number] DOB: 1941-04-12 DOA: 09/12/2024 PCP: Janey Santos, MD    Chief Complaint  Patient presents with   Fall    Brief Narrative:  Edward [name] is a 85 y.o. male with medical history significant of osteoarthritis, sleep apnea, BPH, hyperlipidemia, hypertension, nocturia, history of TIA, history of vertigo, history of spermatocele he CAD, COVID-19, paroxysmal atrial fibrillation, history of Watchman left atrial appendage closure device, elevated PSA, nephrolithiasis who was brought via EMS to the emergency department after falling off the toilet, hitting his head on the right side and sustaining laceration.  Last month, he underwent a cystoscopy with ureteral dilatation, bilateral ureteroscopy laser lithotripsy and ureteral stent placement, Foley catheter. He denied fever, chills, rhinorrhea, sore throat, wheezing or hemoptysis.  Occasional chest discomfort with palpitations for which he takes as needed metoprolo.  He denied diaphoresis, PND, orthopnea or pitting edema of the lower extremities. No abdominal pain, nausea, emesis, diarrhea, constipation, melena or hematochezia.  Mild flank pain, but no dysuria, frequency or hematuria. No polyuria, polydipsia, polyphagia or blurred vision.      Assessment & Plan:   Principal Problem:   Sepsis secondary to UTI [facility]) Active Problems:   Coronary artery disease   Hypercholesterolemia   Hypertension   Paroxysmal atrial fibrillation (HCC)   Iron deficiency anemia   Hydronephrosis with renal and ureteral calculus obstruction   AKI (acute kidney injury)   Acute cystitis with hematuria   Fall   Syncope, vasovagal  #1 sepsis secondary to Klebsiella pneumonia UTI -Patient on admission met criteria for sepsis secondary to tachycardia, significant leukocytosis, urinalysis consistent with a UTI. - Leukocytosis on admission with a white count of 20.4 which has trended down currently at 13.7. - Blood  cultures with no growth to date x 2 days. - Urine cultures with > 100,000 colonies of Klebsiella pneumonia. - Urine culture is resistant to ampicillin with intermediate sensitivity to nitrofurantoin , sensitive to cephalosporins, ertapenem, ciprofloxacin , gentamicin, Bactrim, Unasyn, Zosyn, meropenem. - Patient with clinical improvement - Patient on IV cefepime  and will narrow antibiotics down to IV Ancef . - Urology following.  2.  Acute kidney injury/acute renal failure secondary to obstructive uropathy/bilateral hydronephrosis/ -BPH/urinary retention/urethral stricture  -Patient noted on CT abdomen and pelvis distended bladder and bilateral hydroureteronephrosis right greater than left. - Creatinine on admission noted at 2.89, increased to 3.3 and trending back down currently at 2.33. -Urine output of 2.450 L over the past 24 hours. - Patient seen by urology in consultation, Foley catheter placed per urology. - Urology recommending renal ultrasound in 24 to 48 hours to reassess hydronephrosis. - Continue IV fluids -Continue Flomax , Proscar . - Urology following.  3.  Probable vasovagal syncope versus presyncope -Patient does state that he thinks he may have passed out and has had episodes like this before in the past that have been thoroughly worked up. -CT head and CT C-spine negative. - Patient noted to have been on the commode having a bowel movement prior to his symptoms. - Orthostatics were checked which were negative. - Placed on TED hose. - Outpatient follow-up with PCP.  4.  Complicated UTI -Patient with right perinephric stranding noted on CT scan and currently denies any flank pain.  Urine cultures with Klebsiella, leukocytosis trending down. - Was on IV cefepime  has been narrowed to IV Ancef . - Urology following.  5.  Hyperkalemia -Likely secondary to AKI. - Status post Lokelma , insulin , EKG. - Foley catheter in place. -  Improved, potassium of 4.6 this morning.  6.   Hypertension - BP noted to be soft and as such patient's Cozaar  and Norvasc  initially held. - Resume Norvasc  2.5 mg daily.  7.  A-fib -Currently rate controlled. -Patient noted to be on metoprolol  as needed prior to admission. - Patient not on anticoagulation. - Resume home regimen aspirin  81 mg daily.  8.  Hyperlipidemia -Continue statin.  9.  Fall -PT/OT.    DVT prophylaxis: SCDs Code Status: Full Family Communication: Updated patient, wife, sisters at bedside. Disposition: Likely home when clinically improved.  Status is: Inpatient Remains inpatient appropriate because: Severity of illness   Consultants:  Urology: Dr. Carolee 09/13/2024  Procedures:  CT head 09/12/2024 CT C-spine 09/12/2024 CT maxillofacial 09/12/2024 CT chest abdomen and pelvis 09/12/2024  Antimicrobials:  Anti-infectives (From admission, onward)    Start     Dose/Rate Route Frequency Ordered Stop   09/14/24 1600  ceFAZolin  (ANCEF ) IVPB 1 g/50 mL premix        1 g 100 mL/hr over 30 Minutes Intravenous Every 12 hours 09/14/24 1102     09/12/24 1700  ceFEPIme  (MAXIPIME ) 2 g in sodium chloride  0.9 % 100 mL IVPB  Status:  Discontinued        2 g 200 mL/hr over 30 Minutes Intravenous Every 24 hours 09/12/24 1630 09/14/24 1102   09/12/24 1630  ceFEPIme  (MAXIPIME ) 2 g in sodium chloride  0.9 % 100 mL IVPB  Status:  Discontinued        2 g 200 mL/hr over 30 Minutes Intravenous Every 12 hours 09/12/24 1627 09/12/24 1630   09/12/24 1530  cefTRIAXone  (ROCEPHIN ) 2 g in sodium chloride  0.9 % 100 mL IVPB        2 g 200 mL/hr over 30 Minutes Intravenous  Once 09/12/24 1524 09/12/24 1618         Subjective: Patient sitting up in bed.  Did state he is not sure whether he passed out after falling off the commode.  Denies any chest pain or shortness of breath.  No abdominal pain.  No dysuria.  Wondering whether he was going to get a urological procedure today.  Wife, sisters at bedside.  Objective: Vitals:    09/13/24 1130 09/13/24 1932 09/14/24 0448 09/14/24 1154  BP: (!) 108/50 (!) 125/52 (!) 150/62 (!) 140/66  Pulse: 68 79 70 65  Resp: 16 19 (!) 24   Temp: 98.5 F (36.9 C) 98.3 F (36.8 C) 98.7 F (37.1 C) 98.7 F (37.1 C)  TempSrc: Oral Oral Oral Oral  SpO2:    97%  Weight:      Height:  5' 6 (1.676 m)      Intake/Output Summary (Last 24 hours) at 09/14/2024 1754 Last data filed at 09/14/2024 1727 Gross per 24 hour  Intake 6329.95 ml  Output 2950 ml  Net 3379.95 ml   Filed Weights   09/12/24 1600  Weight: 67.1 kg    Examination:  General exam: Appears calm and comfortable  Respiratory system: Clear to auscultation. Respiratory effort normal. Cardiovascular system: S1 & S2 heard, RRR. No JVD, murmurs, rubs, gallops or clicks. No pedal edema. Gastrointestinal system: Abdomen is nondistended, soft and nontender. No organomegaly or masses felt. Normal bowel sounds heard. Central nervous system: Alert and oriented. No focal neurological deficits. Extremities: Symmetric 5 x 5 power. Skin: No rashes, lesions or ulcers Psychiatry: Judgement and insight appear normal. Mood & affect appropriate.     Data Reviewed: I have personally reviewed following labs  and imaging studies  CBC: Recent Labs  Lab 09/12/24 1440 09/13/24 0546 09/14/24 1035  WBC 20.4* 18.3* 13.7*  NEUTROABS 19.4*  --  12.0*  HGB 10.8* 9.8* 8.6*  HCT 33.9* 30.2* 26.6*  MCV 93.9 92.6 92.7  PLT 277 201 174    Basic Metabolic Panel: Recent Labs  Lab 09/12/24 1440 09/13/24 0546 09/13/24 1559 09/14/24 1035  NA 142 137 136 142  K 5.1 5.6* 5.3* 4.6  CL 113* 107 107 111  CO2 17* 19* 21* 23  GLUCOSE 94 103* 139* 97  BUN 41* 46* 50* 47*  CREATININE 2.89* 3.33* 2.84* 2.33*  CALCIUM  9.2 9.4 9.1 9.0    GFR: Estimated Creatinine Clearance: 22.4 mL/min (A) (by C-G formula based on SCr of 2.33 mg/dL (H)).  Liver Function Tests: Recent Labs  Lab 09/12/24 1440 09/13/24 0546  AST 25 69*  ALT 22 30   ALKPHOS 65 61  BILITOT 0.3 0.4  PROT 6.7 6.6  ALBUMIN 3.4* 3.2*    CBG: Recent Labs  Lab 09/13/24 1657  GLUCAP 126*     Recent Results (from the past 240 hours)  Culture, blood (routine x 2)     Status: None (Preliminary result)   Collection Time: 09/12/24  4:10 PM   Specimen: BLOOD  Result Value Ref Range Status   Specimen Description   Final    BLOOD RIGHT ANTECUBITAL Performed at [facility], [address] [place], [place] [address]    Special Requests   Final    BOTTLES DRAWN AEROBIC AND ANAEROBIC Blood Culture adequate volume Performed at [facility], [address], [place], [place] [address]    Culture   Final    NO GROWTH 2 DAYS Performed at [facility], [address] [place], [place] [address]    Report Status PENDING  Incomplete  Culture, blood (routine x 2)     Status: None (Preliminary result)   Collection Time: 09/12/24  4:10 PM   Specimen: BLOOD  Result Value Ref Range Status   Specimen Description   Final    BLOOD LEFT ANTECUBITAL Performed at [facility], [address], [place], [place] [address]    Special Requests   Final    BOTTLES DRAWN AEROBIC AND ANAEROBIC Blood Culture adequate volume Performed at [facility], [address], [place], [place] [address]    Culture   Final    NO GROWTH 2 DAYS Performed at [facility], [address], [place], [place] [address]    Report Status PENDING  Incomplete  Urine Culture     Status: Abnormal   Collection Time: 09/12/24  5:05 PM   Specimen: Urine, Clean Catch  Result Value Ref Range Status   Specimen Description   Final    URINE, CLEAN CATCH Performed at [facility], [address], [place], [place] [address]    Special Requests   Final    NONE Performed at [facility], [address], [place], [place] [address]    Culture >=100,000 COLONIES/mL KLEBSIELLA  PNEUMONIAE (A)  Final   Report Status 09/14/2024 FINAL  Final   Organism ID, Bacteria KLEBSIELLA PNEUMONIAE (A)  Final      Susceptibility   Klebsiella pneumoniae - MIC*    AMPICILLIN >=32 RESISTANT Resistant     CEFAZOLIN  (URINE) Value in next row Sensitive      2 SENSITIVEThis is a modified FDA-approved test that has  been validated and its performance characteristics determined by the reporting laboratory.  This laboratory is certified under the Clinical Laboratory Improvement Amendments CLIA as qualified to perform high complexity clinical laboratory testing.    CEFEPIME  Value in next row Sensitive      2 SENSITIVEThis is a modified FDA-approved test that has been validated and its performance characteristics determined by the reporting laboratory.  This laboratory is certified under the Clinical Laboratory Improvement Amendments CLIA as qualified to perform high complexity clinical laboratory testing.    ERTAPENEM Value in next row Sensitive      2 SENSITIVEThis is a modified FDA-approved test that has been validated and its performance characteristics determined by the reporting laboratory.  This laboratory is certified under the Clinical Laboratory Improvement Amendments CLIA as qualified to perform high complexity clinical laboratory testing.    CEFTRIAXONE  Value in next row Sensitive      2 SENSITIVEThis is a modified FDA-approved test that has been validated and its performance characteristics determined by the reporting laboratory.  This laboratory is certified under the Clinical Laboratory Improvement Amendments CLIA as qualified to perform high complexity clinical laboratory testing.    CIPROFLOXACIN  Value in next row Sensitive      2 SENSITIVEThis is a modified FDA-approved test that has been validated and its performance characteristics determined by the reporting laboratory.  This laboratory is certified under the Clinical Laboratory Improvement Amendments CLIA as qualified to perform  high complexity clinical laboratory testing.    GENTAMICIN Value in next row Sensitive      2 SENSITIVEThis is a modified FDA-approved test that has been validated and its performance characteristics determined by the reporting laboratory.  This laboratory is certified under the Clinical Laboratory Improvement Amendments CLIA as qualified to perform high complexity clinical laboratory testing.    NITROFURANTOIN  Value in next row Intermediate      2 SENSITIVEThis is a modified FDA-approved test that has been validated and its performance characteristics determined by the reporting laboratory.  This laboratory is certified under the Clinical Laboratory Improvement Amendments CLIA as qualified to perform high complexity clinical laboratory testing.    TRIMETH/SULFA Value in next row Sensitive      2 SENSITIVEThis is a modified FDA-approved test that has been validated and its performance characteristics determined by the reporting laboratory.  This laboratory is certified under the Clinical Laboratory Improvement Amendments CLIA as qualified to perform high complexity clinical laboratory testing.    AMPICILLIN/SULBACTAM Value in next row Sensitive      2 SENSITIVEThis is a modified FDA-approved test that has been validated and its performance characteristics determined by the reporting laboratory.  This laboratory is certified under the Clinical Laboratory Improvement Amendments CLIA as qualified to perform high complexity clinical laboratory testing.    PIP/TAZO Value in next row Sensitive      <=4 SENSITIVEThis is a modified FDA-approved test that has been validated and its performance characteristics determined by the reporting laboratory.  This laboratory is certified under the Clinical Laboratory Improvement Amendments CLIA as qualified to perform high complexity clinical laboratory testing.    MEROPENEM Value in next row Sensitive      <=4 SENSITIVEThis is a modified FDA-approved test that has been  validated and its performance characteristics determined by the reporting laboratory.  This laboratory is certified under the Clinical Laboratory Improvement Amendments CLIA as qualified to perform high complexity clinical laboratory testing.    * >=100,000 COLONIES/mL KLEBSIELLA PNEUMONIAE  Radiology Studies: No results found.       Scheduled Meds:  amLODipine   2.5 mg Oral Daily   aspirin  EC  81 mg Oral Daily   Chlorhexidine  Gluconate Cloth  6 each Topical Daily   finasteride   5 mg Oral QPM   rosuvastatin   20 mg Oral QPM   scopolamine   1 patch Transdermal Q72H   tamsulosin   0.4 mg Oral QPM   Continuous Infusions:   ceFAZolin  (ANCEF ) IV 1 g (09/14/24 1630)   lactated ringers  100 mL/hr at 09/14/24 1706     LOS: 2 days    Time spent: 40 minutes    Toribio Hummer, MD [facility]   To contact the attending provider between 7A-7P or the covering provider during after hours 7P-7A, please log into the web site [email or web address] and access using universal [place] password for that web site. If you do not have the password, please call the hospital operator.  09/14/2024, 5:54 PM    "

## 2024-09-14 NOTE — Progress Notes (Signed)
 "    Subjective: First time meeting patient.  He was resting comfortably in bed in good spirits.  No acute events overnight.  Reviewed case and plan and all questions were answered to satisfaction.  Objective: Vital signs in last 24 hours: Temp:  [98.3 F (36.8 C)-98.7 F (37.1 C)] 98.7 F (37.1 C) (01/28 1154) Pulse Rate:  [65-79] 65 (01/28 1154) Resp:  [19-24] 24 (01/28 0448) BP: (125-150)/(52-66) 140/66 (01/28 1154) SpO2:  [97 %] 97 % (01/28 1154)  Assessment/Plan: # Bilateral hydronephrosis # Urethral stricture # BPH # Urinary retention  Trend labs.  Interval improvement in serum creatinine.  No indication for surgical intervention this morning. Renal ultrasound tomorrow the next day in reassessment of hydronephrosis. Interval improvement in leukocytosis 20.4-13.7.  UCx Klebsiella (+).  Continue Ancef  Urology will follow  Intake/Output from previous day: 01/27 0701 - 01/28 0700 In: 5867 [P.O.:1170; I.V.:4595.7; IV Piggyback:101.3] Out: 2450 [Urine:2450]  Intake/Output this shift: Total I/O In: 1273 [P.O.:720; I.V.:553] Out: 750 [Urine:750]  Physical Exam:  General: Alert and oriented, scattered bruising of the face CV: No cyanosis Lungs: equal chest rise Gu: Foley catheter in place with clear yellow urine  Lab Results: Recent Labs    09/12/24 1440 09/13/24 0546 09/14/24 1035  HGB 10.8* 9.8* 8.6*  HCT 33.9* 30.2* 26.6*   BMET Recent Labs    09/13/24 0546 09/13/24 1559 09/14/24 1035  NA 137 136 142  K 5.6* 5.3* 4.6  CL 107 107 111  CO2 19* 21* 23  GLUCOSE 103* 139* 97  BUN 46* 50* 47*  CREATININE 3.33* 2.84* 2.33*  CALCIUM  9.4 9.1 9.0  HGB 9.8*  --  8.6*  WBC 18.3*  --  13.7*     Studies/Results: CT CHEST ABDOMEN PELVIS WO CONTRAST Result Date: 09/12/2024 EXAM: CT CHEST, ABDOMEN AND PELVIS WITHOUT CONTRAST 09/12/2024 03:45:11 PM TECHNIQUE: CT of the chest, abdomen and pelvis was performed without the administration of intravenous contrast.  Multiplanar reformatted images are provided for review. Automated exposure control, iterative reconstruction, and/or weight based adjustment of the mA/kV was utilized to reduce the radiation dose to as low as reasonably achievable. COMPARISON: CT abdomen and pelvis 09/02/2024, cardiac CT 03/10/2022, chest radiograph 05/06/2023. CLINICAL HISTORY: Polytrauma, blunt. Patient fell, striking the right side. Dizziness. FINDINGS: CHEST: MEDIASTINUM AND LYMPH NODES: Heart: Normal heart size. Cage prosthesis in the left atrial appendage. Calcification in the coronary arteries. No pericardial effusions. Vessels: Calcification in the aorta. Normal caliber thoracic aorta. Airways: The central airways are clear. Lymph Nodes: No mediastinal, hilar or axillary lymphadenopathy. Other: The thyroid gland is unremarkable. The esophagus is decompressed. There are no mediastinal fluid collections. LUNGS AND PLEURA: Lungs: Mild dependent atelectasis in the lung bases. No airspace disease or consolidation in the lungs. Pleura: No pleural effusion or pneumothorax. ABDOMEN AND PELVIS: LIVER: Unremarkable. GALLBLADDER AND BILE DUCTS: Unremarkable. No biliary ductal dilatation. SPLEEN: No acute abnormality. PANCREAS: No acute abnormality. ADRENAL GLANDS: No adrenal gland nodules. KIDNEYS, URETERS AND BLADDER: Kidneys: Lower pole left intrarenal stone measuring 4 mm diameter. Mild left hydronephrosis and hydroureter, possibly due to reflux. The right kidney is asymmetrically enlarged with stranding around the right kidney. Moderate right hydronephrosis and hydroureter, similar to prior study. Changes could be due to reflux, pyelonephritis, occult stone, or recently passed stone. Ureters: No ureteral stones are demonstrated. Bladder: Mild cellular formation of the bladder wall may indicate evidence of outlet obstruction. No discrete wall thickening, stone, or filling defect in the bladder. Prostate: The prostate gland is  enlarged. The posterior  urethra appears mildly dilated. GI AND BOWEL: Stomach: Stomach demonstrates no acute abnormality. Bowel: The small bowel and colon are not abnormally distended. No wall thickening or inflammatory stranding is appreciated. Scattered colonic diverticula without evidence of acute diverticulitis. The appendix is not seen. There is no bowel obstruction. REPRODUCTIVE ORGANS: The prostate gland is enlarged. The posterior urethra appears mildly dilated. PERITONEUM AND RETROPERITONEUM: Fluid/Air: No free air or free fluid is present. Hernia: Minimal periumbilical hernia containing fat. Vascular: Calcification of the abdominal aorta. No aneurysm. Lymph Nodes: No retroperitoneal lymphadenopathy. VASCULATURE: Calcification of the abdominal aorta. No aneurysm. ABDOMINAL AND PELVIS LYMPH NODES: No pelvic lymphadenopathy. No retroperitoneal lymphadenopathy. BONES AND SOFT TISSUES: Bones: Degenerative changes in the spine and hips. No acute bony abnormalities. Soft Tissues: No focal soft tissue abnormality. IMPRESSION: 1. No evidence of acute traumatic injury. Electronically signed by: Elsie Gravely MD 09/12/2024 04:08 PM EST RP Workstation: [id number]   CT Maxillofacial Wo Contrast Result Date: 09/12/2024 CLINICAL DATA:  Blunt facial trauma off toilet EXAM: CT MAXILLOFACIAL WITHOUT CONTRAST TECHNIQUE: Multidetector CT imaging of the maxillofacial structures was performed. Multiplanar CT image reconstructions were also generated. RADIATION DOSE REDUCTION: This exam was performed according to the departmental dose-optimization program which includes automated exposure control, adjustment of the mA and/or kV according to patient size and/or use of iterative reconstruction technique. COMPARISON:  None recent FINDINGS: Osseous: Fluid within the left mastoid air cells. Mandibular heads are normally position. No mandibular fracture. Pterygoid plates and zygomatic arches are intact. No acute nasal bone fracture. Orbits: Negative. No  traumatic or inflammatory finding. Sinuses: Mucous retention cysts and mucosal thickening. No fluid levels. No sinus wall fracture Soft tissues: Negative. Limited intracranial: See separately dictated head CT IMPRESSION: 1. No CT evidence for acute facial bone fracture. 2. Left mastoid effusion Electronically Signed   By: Luke Bun M.D.   On: 09/12/2024 16:05   CT Cervical Spine Wo Contrast Result Date: 09/12/2024 CLINICAL DATA:  Toilet EXAM: CT CERVICAL SPINE WITHOUT CONTRAST TECHNIQUE: Multidetector CT imaging of the cervical spine was performed without intravenous contrast. Multiplanar CT image reconstructions were also generated. RADIATION DOSE REDUCTION: This exam was performed according to the departmental dose-optimization program which includes automated exposure control, adjustment of the mA and/or kV according to patient size and/or use of iterative reconstruction technique. COMPARISON:  None Available. FINDINGS: Alignment: Straightening of the cervical spine. No subluxation. Facet is normal Skull base and vertebrae: No acute fracture. No primary bone lesion or focal pathologic process. Soft tissues and spinal canal: No prevertebral fluid or swelling. No visible canal hematoma. Disc levels: Diffuse degenerative changes. Mild to moderate diffuse disc space narrowing C3 through C7. Multilevel facet degenerative change with foraminal narrowing. Advanced C1-C2 degenerative change with prominent partially calcified pannus. Narrowing of the canal at the C1-C2 level due to calcified pannus. Posterior disc osteophyte at multiple levels, results in at least mild canal stenosis at C3-C4, C4-C5, moderate stenosis at C5-C6, and moderate severe canal stenosis at C6-C7. Upper chest: Lung apices are clear Other: None IMPRESSION: 1. Straightening of the cervical spine with diffuse degenerative changes. No acute osseous abnormality. Electronically Signed   By: Luke Bun M.D.   On: 09/12/2024 16:02   CT Head Wo  Contrast Result Date: 09/12/2024 CLINICAL DATA:  Head trauma fell off toilet EXAM: CT HEAD WITHOUT CONTRAST TECHNIQUE: Contiguous axial images were obtained from the base of the skull through the vertex without intravenous contrast. RADIATION DOSE REDUCTION: This exam was performed  according to the departmental dose-optimization program which includes automated exposure control, adjustment of the mA and/or kV according to patient size and/or use of iterative reconstruction technique. COMPARISON:  MRI brain 10/30/2020, CT head 01/15/2018 FINDINGS: Brain: No acute territorial infarction, hemorrhage or intracranial mass. 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 The ventricles are nonenlarged Vascular: No hyperdense vessels.  No unexpected calcification Skull: Normal. Negative for fracture or focal lesion. Sinuses/Orbits: No acute finding. Other: None IMPRESSION: 1. No CT evidence for acute intracranial abnormality. 2. 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 Electronically Signed   By: Luke Bun M.D.   On: 09/12/2024 15:54      LOS: 2 days   Edward Bourdon, NP [facility] Pager: [phone]  09/14/2024, 3:35 PM  "

## 2024-09-14 NOTE — Evaluation (Signed)
 Physical Therapy Evaluation Patient Details Name: Edward [name] MRN: [id number] DOB: 03-Jul-1941 Today's Date: 09/14/2024  History of Present Illness  84 yo male admitted with sepsis, uti, obstructive uropathy, L mastoid effusion, fall. Hx of TIA, dizziness, OA, BPH, TIA, vertigo, CAD, COVID, Afib  Clinical Impression  On eval, pt required Min A for mobility. He ambulated ~150 feet with a RW. Pt presents with general weakness, decreased activity tolerance, and impaired gait/balance. Pt stated he always has some dizziness during session. He also reported feeling fatigued at end of session. Will plan to follow and progress activity as tolerated. Recommend HHPT f/u.         If plan is discharge home, recommend the following: A little help with walking and/or transfers;A little help with bathing/dressing/bathroom;Assistance with cooking/housework;Assist for transportation;Help with stairs or ramp for entrance   Can travel by private vehicle        Equipment Recommendations Rolling walker (2 wheels)  Recommendations for Other Services       Functional Status Assessment Patient has had a recent decline in their functional status and demonstrates the ability to make significant improvements in function in a reasonable and predictable amount of time.     Precautions / Restrictions Precautions Precautions: Fall Restrictions Weight Bearing Restrictions Per Provider Order: No      Mobility  Bed Mobility Overal bed mobility: Needs Assistance Bed Mobility: Supine to Sit, Sit to Supine     Supine to sit: Contact guard, HOB elevated, Used rails Sit to supine: Contact guard assist, HOB elevated, Used rails   General bed mobility comments: Increased time and effort for pt. Cues for safety. CGA for lines    Transfers Overall transfer level: Needs assistance Equipment used: Rolling walker (2 wheels) Transfers: Sit to/from Stand Sit to Stand: Min assist           General transfer  comment: Cues for safety, technique, hand placement. Assist to rise, steady    Ambulation/Gait Ambulation/Gait assistance: Min assist Gait Distance (Feet): 150 Feet Assistive device: Rolling walker (2 wheels)         General Gait Details: Intermittent assist to steady. Cues for safety, posture, RW proximity, proper operation of RW. Tolerated distance well. Mild lightheadedness reported  Stairs            Wheelchair Mobility     Tilt Bed    Modified Rankin (Stroke Patients Only)       Balance Overall balance assessment: Needs assistance, History of Falls         Standing balance support: Bilateral upper extremity supported, During functional activity, Reliant on assistive device for balance Standing balance-Leahy Scale: Poor                               Pertinent Vitals/Pain Pain Assessment Pain Assessment: No/denies pain    Home Living Family/patient expects to be discharged to:: Private residence Living Arrangements: Spouse/significant other Available Help at Discharge: Family Type of Home: House Home Access: Stairs to enter     Alternate Level Stairs-Number of Steps: flight Home Layout: Two level Home Equipment: [place] - single point      Prior Function Prior Level of Function : Independent/Modified Independent                     Extremity/Trunk Assessment   Upper Extremity Assessment Upper Extremity Assessment: Defer to OT evaluation    Lower Extremity Assessment Lower  Extremity Assessment: Generalized weakness    Cervical / Trunk Assessment Cervical / Trunk Assessment: Normal  Communication   Communication Communication: No apparent difficulties    Cognition Arousal: Alert Behavior During Therapy: WFL for tasks assessed/performed   PT - Cognitive impairments: No apparent impairments                       PT - Cognition Comments: cues for safety during session Following commands: Intact        Cueing Cueing Techniques: Verbal cues     General Comments      Exercises     Assessment/Plan    PT Assessment Patient needs continued PT services  PT Problem List Decreased strength;Decreased range of motion;Decreased activity tolerance;Decreased balance;Decreased mobility;Decreased knowledge of use of DME;Decreased safety awareness       PT Treatment Interventions DME instruction;Gait training;Functional mobility training;Therapeutic activities;Therapeutic exercise;Patient/family education;Balance training    PT Goals (Current goals can be found in the Care Plan section)  Acute Rehab PT Goals Patient Stated Goal: continue to feel better PT Goal Formulation: With patient Time For Goal Achievement: 09/28/24 Potential to Achieve Goals: Good    Frequency Min 3X/week     Co-evaluation               AM-PAC PT 6 Clicks Mobility  Outcome Measure Help needed turning from your back to your side while in a flat bed without using bedrails?: A Little Help needed moving from lying on your back to sitting on the side of a flat bed without using bedrails?: A Little Help needed moving to and from a bed to a chair (including a wheelchair)?: A Little Help needed standing up from a chair using your arms (e.g., wheelchair or bedside chair)?: A Little Help needed to walk in hospital room?: A Little Help needed climbing 3-5 steps with a railing? : A Little 6 Click Score: 18    End of Session Equipment Utilized During Treatment: Gait belt Activity Tolerance: Patient tolerated treatment well Patient left: in bed;with call bell/phone within reach;with bed alarm set        Time: 8461-8381 PT Time Calculation (min) (ACUTE ONLY): 40 min   Charges:   PT Evaluation $PT Eval Low Complexity: 1 Low PT Treatments $Gait Training: 23-37 mins PT General Charges $$ ACUTE PT VISIT: 1 Visit            Dannial SQUIBB, PT Acute Rehabilitation  Office: [phone]

## 2024-09-14 NOTE — Plan of Care (Signed)

## 2024-09-14 NOTE — TOC Initial Note (Signed)
 Transition of Care [facility]) - Initial/Assessment Note    Patient Details  Name: Edward [name] MRN: [id number] Date of Birth: 09/21/1940  [facility]) CM/SW Contact:    Doneta Glenys DASEN, RN Phone Number: 09/14/2024,   Clinical Narrative:                 PTA lives in a house with spouse. Use a cane at baseline. Denies HH and SDOH needs. [place] (wife) will transport home at discharge. IP CM following for PT recommendation.  Expected Discharge Plan: Home w Home Health Services Barriers to Discharge: Continued Medical Work up   Patient Goals and CMS Choice Patient states their goals for this hospitalization and ongoing recovery are:: Home with [facility] CMS Medicare.gov Compare Post Acute Care list provided to:: Patient Choice offered to / list presented to : Patient [place] ownership interest in [facility].provided to:: Patient    Expected Discharge Plan and Services In-house Referral: NA Discharge Planning Services: CM Consult   Living arrangements for the past 2 months: Single Family Home                 DME Arranged: N/A DME Agency: NA       HH Arranged: NA HH Agency: NA        Prior Living Arrangements/Services Living arrangements for the past 2 months: Single Family Home Lives with:: Spouse Patient language and need for interpreter reviewed:: Yes Do you feel safe going back to the place where you live?: Yes      Need for Family Participation in Patient Care: Yes (Comment) Care giver support system in place?: Yes (comment) Current home services: DME (cane) Criminal Activity/Legal Involvement Pertinent to Current Situation/Hospitalization: No - Comment as needed  Activities of Daily Living   ADL Screening (condition at time of admission) Independently performs ADLs?: Yes (appropriate for developmental age) Is the patient deaf or have difficulty hearing?: No Does the patient have difficulty seeing, even when wearing glasses/contacts?: No Does  the patient have difficulty concentrating, remembering, or making decisions?: No  Permission Sought/Granted Permission sought to share information with : Case Manager Permission granted to share information with : Yes, Verbal Permission Granted  Share Information with NAME: Kendric, Sindelar, Emergency Contact  [phone]           Emotional Assessment Appearance:: Appears stated age Attitude/Demeanor/Rapport: Engaged Affect (typically observed): Appropriate Orientation: : Oriented to Self, Oriented to Place, Oriented to  Time, Oriented to Situation Alcohol / Substance Use: Not Applicable Psych Involvement: No (comment)  Admission diagnosis:  Dehydration [E86.0] Acute cystitis with hematuria [N30.01] Generalized weakness [R53.1] Sepsis secondary to UTI (HCC) [A41.9, N39.0] Minor head injury, initial encounter [S09.90XA] Fall, initial encounter [W19.XXXA] Patient Active Problem List   Diagnosis Date Noted   Sepsis secondary to UTI (HCC) 09/12/2024   Heme positive stool 09/12/2024   Aortic valve disorder 09/12/2024   History of stroke without residual deficits 09/12/2024   History of TIAs 09/12/2024   Iron deficiency anemia 09/12/2024   Hydronephrosis with renal and ureteral calculus obstruction 09/12/2024   Mood disorder 09/12/2024   AKI (acute kidney injury) 09/12/2024   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   Presence of Watchman left atrial appendage closure device 01/09/2022   PAF (paroxysmal atrial fibrillation) (HCC) 01/09/2022   Diastasis recti 09/16/2021   Paroxysmal atrial fibrillation (HCC) 08/14/2021   TIA (transient ischemic attack) 10/09/2016   Expressive aphasia 10/09/2016   Slurred speech 10/09/2016   Angina pectoris 02/20/2016  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 [place] [address] Phone: [phone] Fax: [phone]     Social Drivers of Health (SDOH) Social History: SDOH Screenings   Food Insecurity: No Food Insecurity (09/12/2024)  Housing: Low Risk (09/12/2024)  Transportation Needs: No Transportation Needs (09/12/2024)  Utilities: Not At Risk (09/12/2024)  Social Connections: Socially Integrated (09/12/2024)  Tobacco Use: Medium Risk (09/12/2024)   SDOH Interventions:     Readmission Risk Interventions    09/14/2024    2:24 PM  Readmission Risk Prevention Plan  Post Dischage Appt Complete  Medication Screening Complete  Transportation Screening Complete

## 2024-09-15 ENCOUNTER — Inpatient Hospital Stay (HOSPITAL_COMMUNITY)

## 2024-09-15 DIAGNOSIS — N179 Acute kidney failure, unspecified: Secondary | ICD-10-CM | POA: Diagnosis not present

## 2024-09-15 DIAGNOSIS — R55 Syncope and collapse: Secondary | ICD-10-CM | POA: Diagnosis not present

## 2024-09-15 DIAGNOSIS — A419 Sepsis, unspecified organism: Secondary | ICD-10-CM | POA: Diagnosis not present

## 2024-09-15 DIAGNOSIS — W19XXXA Unspecified fall, initial encounter: Secondary | ICD-10-CM | POA: Diagnosis not present

## 2024-09-15 DIAGNOSIS — N132 Hydronephrosis with renal and ureteral calculous obstruction: Secondary | ICD-10-CM | POA: Diagnosis not present

## 2024-09-15 DIAGNOSIS — N390 Urinary tract infection, site not specified: Secondary | ICD-10-CM | POA: Diagnosis not present

## 2024-09-15 DIAGNOSIS — E7800 Pure hypercholesterolemia, unspecified: Secondary | ICD-10-CM | POA: Diagnosis not present

## 2024-09-15 DIAGNOSIS — I480 Paroxysmal atrial fibrillation: Secondary | ICD-10-CM | POA: Diagnosis not present

## 2024-09-15 DIAGNOSIS — I10 Essential (primary) hypertension: Secondary | ICD-10-CM | POA: Diagnosis not present

## 2024-09-15 LAB — FERRITIN: Ferritin: 318 ng/mL (ref 24–336)

## 2024-09-15 LAB — CBC WITH DIFFERENTIAL/PLATELET
Abs Immature Granulocytes: 0.07 10*3/uL (ref 0.00–0.07)
Basophils Absolute: 0 10*3/uL (ref 0.0–0.1)
Basophils Relative: 0 %
Eosinophils Absolute: 0.3 10*3/uL (ref 0.0–0.5)
Eosinophils Relative: 3 %
HCT: 24.9 % — ABNORMAL LOW (ref 39.0–52.0)
Hemoglobin: 8.1 g/dL — ABNORMAL LOW (ref 13.0–17.0)
Immature Granulocytes: 1 %
Lymphocytes Relative: 8 %
Lymphs Abs: 0.8 10*3/uL (ref 0.7–4.0)
MCH: 30.2 pg (ref 26.0–34.0)
MCHC: 32.5 g/dL (ref 30.0–36.0)
MCV: 92.9 fL (ref 80.0–100.0)
Monocytes Absolute: 0.5 10*3/uL (ref 0.1–1.0)
Monocytes Relative: 5 %
Neutro Abs: 8.2 10*3/uL — ABNORMAL HIGH (ref 1.7–7.7)
Neutrophils Relative %: 83 %
Platelets: 162 10*3/uL (ref 150–400)
RBC: 2.68 MIL/uL — ABNORMAL LOW (ref 4.22–5.81)
RDW: 14.5 % (ref 11.5–15.5)
WBC: 9.9 10*3/uL (ref 4.0–10.5)
nRBC: 0 % (ref 0.0–0.2)

## 2024-09-15 LAB — IRON AND TIBC
Iron: 36 ug/dL — ABNORMAL LOW (ref 45–182)
Saturation Ratios: 15 % — ABNORMAL LOW (ref 17.9–39.5)
TIBC: 242 ug/dL — ABNORMAL LOW (ref 250–450)
UIBC: 206 ug/dL

## 2024-09-15 LAB — BASIC METABOLIC PANEL WITH GFR
Anion gap: 9 (ref 5–15)
BUN: 38 mg/dL — ABNORMAL HIGH (ref 8–23)
CO2: 21 mmol/L — ABNORMAL LOW (ref 22–32)
Calcium: 9 mg/dL (ref 8.9–10.3)
Chloride: 111 mmol/L (ref 98–111)
Creatinine, Ser: 2.13 mg/dL — ABNORMAL HIGH (ref 0.61–1.24)
GFR, Estimated: 31 mL/min — ABNORMAL LOW
Glucose, Bld: 101 mg/dL — ABNORMAL HIGH (ref 70–99)
Potassium: 4.3 mmol/L (ref 3.5–5.1)
Sodium: 141 mmol/L (ref 135–145)

## 2024-09-15 LAB — AMMONIA: Ammonia: 10 umol/L (ref 9–35)

## 2024-09-15 LAB — VITAMIN B12: Vitamin B-12: 549 pg/mL (ref 180–914)

## 2024-09-15 LAB — FOLATE: Folate: 14 ng/mL

## 2024-09-15 MED ORDER — TRAZODONE HCL 50 MG PO TABS
50.0000 mg | ORAL_TABLET | Freq: Every evening | ORAL | Status: DC | PRN
  Administered 2024-09-15 (×2): 50 mg via ORAL
  Filled 2024-09-15 (×2): qty 1

## 2024-09-15 MED ORDER — LACTATED RINGERS IV SOLN: INTRAVENOUS | Status: AC

## 2024-09-15 MED ORDER — MELATONIN 3 MG PO TABS
3.0000 mg | ORAL_TABLET | Freq: Every evening | ORAL | Status: DC | PRN
  Administered 2024-09-15 (×2): 3 mg via ORAL
  Filled 2024-09-15 (×2): qty 1

## 2024-09-15 NOTE — Plan of Care (Signed)

## 2024-09-15 NOTE — Progress Notes (Signed)
" ° °  °  Subjective: No acute events overnight.  Up in bed, conversational this morning.  Urine still clear and interval progress in renal indices and leukocytosis.  Objective: Vital signs in last 24 hours: Temp:  [98.1 F (36.7 C)-98.6 F (37 C)] 98.1 F (36.7 C) (01/29 0431) Pulse Rate:  [60-80] 60 (01/29 0431) Resp:  [16] 16 (01/29 0431) BP: (145-179)/(52-69) 145/52 (01/29 0431) SpO2:  [95 %-99 %] 95 % (01/29 0431)  Assessment/Plan: # Bilateral hydronephrosis # Urethral stricture # BPH # Urinary retention  Trend labs.  Serum creatinine continues to improve.  3.3--> 2.13 this a.m. Renal ultrasound pending, if hydronephrosis has resolved we will proceed with voiding trial tomorrow morning. Leukocytosis has resolved-9.9 this a.m.  UCx Klebsiella (+).  Continue Ancef  Okay for discharge once medically clear.  Follow-up in clinic  Intake/Output from previous day: 01/28 0701 - 01/29 0700 In: 3117.3 [P.O.:1080; I.V.:1937.3; IV Piggyback:100] Out: 2550 [Urine:2550]  Intake/Output this shift: Total I/O In: 240 [P.O.:240] Out: 450 [Urine:450]  Physical Exam:  General: Alert and oriented, scattered bruising of the face CV: No cyanosis Lungs: equal chest rise Gu: Foley catheter in place with clear yellow urine  Lab Results: Recent Labs    09/13/24 0546 09/14/24 1035 09/15/24 0530  HGB 9.8* 8.6* 8.1*  HCT 30.2* 26.6* 24.9*   BMET Recent Labs    09/14/24 1035 09/15/24 0530  NA 142 141  K 4.6 4.3  CL 111 111  CO2 23 21*  GLUCOSE 97 101*  BUN 47* 38*  CREATININE 2.33* 2.13*  CALCIUM  9.0 9.0  HGB 8.6* 8.1*  WBC 13.7* 9.9     Studies/Results: No results found.     LOS: 3 days   Ole Bourdon, NP [facility] Pager: [phone]  09/15/2024, 1:08 PM  "

## 2024-09-15 NOTE — TOC Progression Note (Signed)
 Transition of Care [facility]) - Progression Note    Patient Details  Name: [name] MRN: [id number] Date of Birth: 14-Feb-1941  [facility]) CM/SW Contact  Sonda Manuella Quill, RN Phone Number: 09/15/2024, 3:42 PM  Clinical Narrative:    [place] w/ pt and spouse [place] Cory at bedside; they agree to recc HHPT/OT; they do not have agency preference; faxed out for offers   Expected Discharge Plan: Home w Home Health Services Barriers to Discharge: Continued Medical Work up               Expected Discharge Plan and Services In-house Referral: NA Discharge Planning Services: CM Consult   Living arrangements for the past 2 months: Single Family Home                 DME Arranged: N/A DME Agency: NA       HH Arranged: NA HH Agency: NA         Social Drivers of Health (SDOH) Interventions SDOH Screenings   Food Insecurity: No Food Insecurity (09/12/2024)  Housing: Low Risk (09/12/2024)  Transportation Needs: No Transportation Needs (09/12/2024)  Utilities: Not At Risk (09/12/2024)  Social Connections: Socially Integrated (09/12/2024)  Tobacco Use: Medium Risk (09/12/2024)    Readmission Risk Interventions    09/14/2024    2:24 PM  Readmission Risk Prevention Plan  Post Dischage Appt Complete  Medication Screening Complete  Transportation Screening Complete

## 2024-09-15 NOTE — Progress Notes (Signed)
 " PROGRESS NOTE    Edward [name]  FMW:[id number] DOB: 1941/01/22 DOA: 09/12/2024 PCP: Janey Santos, MD    Chief Complaint  Patient presents with   Fall    Brief Narrative:  Edward [name] is a 84 y.o. male with medical history significant of osteoarthritis, sleep apnea, BPH, hyperlipidemia, hypertension, nocturia, history of TIA, history of vertigo, history of spermatocele he CAD, COVID-19, paroxysmal atrial fibrillation, history of Watchman left atrial appendage closure device, elevated PSA, nephrolithiasis who was brought via EMS to the emergency department after falling off the toilet, hitting his head on the right side and sustaining laceration.  Last month, he underwent a cystoscopy with ureteral dilatation, bilateral ureteroscopy laser lithotripsy and ureteral stent placement, Foley catheter. He denied fever, chills, rhinorrhea, sore throat, wheezing or hemoptysis.  Occasional chest discomfort with palpitations for which he takes as needed metoprolo.  He denied diaphoresis, PND, orthopnea or pitting edema of the lower extremities. No abdominal pain, nausea, emesis, diarrhea, constipation, melena or hematochezia.  Mild flank pain, but no dysuria, frequency or hematuria. No polyuria, polydipsia, polyphagia or blurred vision.      Assessment & Plan:   Principal Problem:   Sepsis secondary to UTI [facility]) Active Problems:   Coronary artery disease   Hypercholesterolemia   Hypertension   Paroxysmal atrial fibrillation (HCC)   Iron deficiency anemia   Hydronephrosis with renal and ureteral calculus obstruction   AKI (acute kidney injury)   Acute cystitis with hematuria   Fall   Syncope, vasovagal  #1 sepsis secondary to Klebsiella pneumonia UTI -Patient on admission met criteria for sepsis secondary to tachycardia, significant leukocytosis, urinalysis consistent with a UTI. - Leukocytosis on admission with a white count of 20.4 which has trended down currently at 13.7. - Blood  cultures with no growth to date x 2 days. - Urine cultures with > 100,000 colonies of Klebsiella pneumonia. - Urine culture is resistant to ampicillin with intermediate sensitivity to nitrofurantoin , sensitive to cephalosporins, ertapenem, ciprofloxacin , gentamicin, Bactrim, Unasyn, Zosyn, meropenem. - Patient with clinical improvement - Patient was on IV cefepime  and has been narrowed down to IV Ancef  and if continued improvement will transition to oral antibiotics tomorrow to complete a course of antibiotic treatment. - Urology following.  2.  Acute kidney injury/acute renal failure secondary to obstructive uropathy/bilateral hydronephrosis/ -BPH/urinary retention/urethral stricture  -Patient noted on CT abdomen and pelvis distended bladder and bilateral hydroureteronephrosis right greater than left. - Creatinine on admission noted at 2.89, increased to 3.3 and trending back down currently at 2.13. -Urine output of 2.550 L over the past 24 hours. - Patient seen by urology in consultation, Foley catheter placed per urology. - Urology recommending renal ultrasound in 24 to 48 hours to reassess hydronephrosis which per patient was done this morning and currently pending. - Continue IV fluids -Continue Flomax , Proscar . - Urology following.  3.  Probable vasovagal syncope versus presyncope -Patient does state that he thinks he may have passed out and has had episodes like this before in the past that have been thoroughly worked up. -CT head and CT C-spine negative. - Patient noted to have been on the commode having a bowel movement prior to his symptoms. - Orthostatics were checked which were negative. - Continue TED hose.   - Outpatient follow-up with PCP.   4.  Complicated UTI -Patient with right perinephric stranding noted on CT scan and currently denies any flank pain.  Urine cultures with Klebsiella, leukocytosis trending down. - Was on IV  cefepime  has been narrowed to IV  Ancef . -Continue IV Ancef  transition to oral antibiotics tomorrow if continued clinical improvement. - Urology following.  5.  Hyperkalemia -Likely secondary to AKI. - Status post Lokelma , insulin , EKG. - Foley catheter in place. - Potassium of 4.3 today.   6.  Hypertension - BP noted to be soft on admission and as such patient's Cozaar  and Norvasc  initially held. - Continue Norvasc  2.5 mg daily. - Continue to hold Cozaar .   7.  A-fib -Currently rate controlled. -Patient noted to be on metoprolol  as needed prior to admission. - Patient not on anticoagulation. - Aspirin  81 mg daily.   8.  Hyperlipidemia - Statin.  9.  Fall -PT/OT.    DVT prophylaxis: SCDs Code Status: Full Family Communication: Updated patient, wife, at bedside. Disposition: Likely home when clinically improved, improvement in renal function and cleared by urology..  Status is: Inpatient Remains inpatient appropriate because: Severity of illness   Consultants:  Urology: Dr. Carolee 09/13/2024  Procedures:  CT head 09/12/2024 CT C-spine 09/12/2024 CT maxillofacial 09/12/2024 CT chest abdomen and pelvis 09/12/2024  Antimicrobials:  Anti-infectives (From admission, onward)    Start     Dose/Rate Route Frequency Ordered Stop   09/14/24 1600  ceFAZolin  (ANCEF ) IVPB 1 g/50 mL premix        1 g 100 mL/hr over 30 Minutes Intravenous Every 12 hours 09/14/24 1102     09/12/24 1700  ceFEPIme  (MAXIPIME ) 2 g in sodium chloride  0.9 % 100 mL IVPB  Status:  Discontinued        2 g 200 mL/hr over 30 Minutes Intravenous Every 24 hours 09/12/24 1630 09/14/24 1102   09/12/24 1630  ceFEPIme  (MAXIPIME ) 2 g in sodium chloride  0.9 % 100 mL IVPB  Status:  Discontinued        2 g 200 mL/hr over 30 Minutes Intravenous Every 12 hours 09/12/24 1627 09/12/24 1630   09/12/24 1530  cefTRIAXone  (ROCEPHIN ) 2 g in sodium chloride  0.9 % 100 mL IVPB        2 g 200 mL/hr over 30 Minutes Intravenous  Once 09/12/24 1524 09/12/24 1618          Subjective: Patient sitting up in chair.  Overall feels better.  Patient denies any chest pain or shortness of breath.  Denies any abdominal pain.  Wife at bedside.  Patient states renal ultrasound was done this morning.  Objective: Vitals:   09/14/24 0448 09/14/24 1154 09/14/24 1957 09/15/24 0431  BP: (!) 150/62 (!) 140/66 (!) 179/69 (!) 145/52  Pulse: 70 65 80 60  Resp: (!) 24   16  Temp: 98.7 F (37.1 C) 98.7 F (37.1 C) 98.6 F (37 C) 98.1 F (36.7 C)  TempSrc: Oral Oral Oral Oral  SpO2:  97% 99% 95%  Weight:      Height:        Intake/Output Summary (Last 24 hours) at 09/15/2024 1301 Last data filed at 09/15/2024 0900 Gross per 24 hour  Intake 3117.28 ml  Output 2250 ml  Net 867.28 ml   Filed Weights   09/12/24 1600  Weight: 67.1 kg    Examination:  General exam: NAD Respiratory system: CTAB.  No wheeze, no crackles, no rhonchi.  Fair air movement.  Speaking in full sentences.  Cardiovascular system: RRR no murmurs rubs or gallops.  No JVD.  No lower extremity edema.  Gastrointestinal system: Abdomen is soft, nontender, nondistended, positive bowel sounds with no rebound.  No guarding. Central nervous system:  Alert and oriented. No focal neurological deficits. Extremities: Symmetric 5 x 5 power. Skin: No rashes, lesions or ulcers Psychiatry: Judgement and insight appear normal. Mood & affect appropriate.     Data Reviewed: I have personally reviewed following labs and imaging studies  CBC: Recent Labs  Lab 09/12/24 1440 09/13/24 0546 09/14/24 1035 09/15/24 0530  WBC 20.4* 18.3* 13.7* 9.9  NEUTROABS 19.4*  --  12.0* 8.2*  HGB 10.8* 9.8* 8.6* 8.1*  HCT 33.9* 30.2* 26.6* 24.9*  MCV 93.9 92.6 92.7 92.9  PLT 277 201 174 162    Basic Metabolic Panel: Recent Labs  Lab 09/12/24 1440 09/13/24 0546 09/13/24 1559 09/14/24 1035 09/15/24 0530  NA 142 137 136 142 141  K 5.1 5.6* 5.3* 4.6 4.3  CL 113* 107 107 111 111  CO2 17* 19* 21* 23 21*   GLUCOSE 94 103* 139* 97 101*  BUN 41* 46* 50* 47* 38*  CREATININE 2.89* 3.33* 2.84* 2.33* 2.13*  CALCIUM  9.2 9.4 9.1 9.0 9.0    GFR: Estimated Creatinine Clearance: 24.5 mL/min (A) (by C-G formula based on SCr of 2.13 mg/dL (H)).  Liver Function Tests: Recent Labs  Lab 09/12/24 1440 09/13/24 0546  AST 25 69*  ALT 22 30  ALKPHOS 65 61  BILITOT 0.3 0.4  PROT 6.7 6.6  ALBUMIN 3.4* 3.2*    CBG: Recent Labs  Lab 09/13/24 1657  GLUCAP 126*     Recent Results (from the past 240 hours)  Culture, blood (routine x 2)     Status: None (Preliminary result)   Collection Time: 09/12/24  4:10 PM   Specimen: BLOOD  Result Value Ref Range Status   Specimen Description   Final    BLOOD RIGHT ANTECUBITAL Performed at [facility], [address], [place], [place] [address]    Special Requests   Final    BOTTLES DRAWN AEROBIC AND ANAEROBIC Blood Culture adequate volume Performed at [facility], [address] [place], [place] [address]    Culture   Final    NO GROWTH 3 DAYS Performed at [facility], [address] [place], [place] [address]    Report Status PENDING  Incomplete  Culture, blood (routine x 2)     Status: None (Preliminary result)   Collection Time: 09/12/24  4:10 PM   Specimen: BLOOD  Result Value Ref Range Status   Specimen Description   Final    BLOOD LEFT ANTECUBITAL Performed at [name], [address] [place], [place] [address]    Special Requests   Final    BOTTLES DRAWN AEROBIC AND ANAEROBIC Blood Culture adequate volume Performed at [facility], [address], [place], [place] [address]    Culture   Final    NO GROWTH 3 DAYS Performed at [facility], [address], [place], [place] [address]    Report Status PENDING  Incomplete  Urine Culture     Status: Abnormal   Collection Time: 09/12/24  5:05 PM   Specimen: Urine, Clean Catch  Result Value  Ref Range Status   Specimen Description   Final    URINE, CLEAN CATCH Performed at [facility], [address] [place], [place] [address]    Special Requests   Final    NONE Performed at [facility], [address], [place], [place] [address]    Culture >=100,000 COLONIES/mL KLEBSIELLA PNEUMONIAE (A)  Final   Report  Status 09/14/2024 FINAL  Final   Organism ID, Bacteria KLEBSIELLA PNEUMONIAE (A)  Final      Susceptibility   Klebsiella pneumoniae - MIC*    AMPICILLIN >=32 RESISTANT Resistant     CEFAZOLIN  (URINE) Value in next row Sensitive      2 SENSITIVEThis is a modified FDA-approved test that has been validated and its performance characteristics determined by the reporting laboratory.  This laboratory is certified under the Clinical Laboratory Improvement Amendments CLIA as qualified to perform high complexity clinical laboratory testing.    CEFEPIME  Value in next row Sensitive      2 SENSITIVEThis is a modified FDA-approved test that has been validated and its performance characteristics determined by the reporting laboratory.  This laboratory is certified under the Clinical Laboratory Improvement Amendments CLIA as qualified to perform high complexity clinical laboratory testing.    ERTAPENEM Value in next row Sensitive      2 SENSITIVEThis is a modified FDA-approved test that has been validated and its performance characteristics determined by the reporting laboratory.  This laboratory is certified under the Clinical Laboratory Improvement Amendments CLIA as qualified to perform high complexity clinical laboratory testing.    CEFTRIAXONE  Value in next row Sensitive      2 SENSITIVEThis is a modified FDA-approved test that has been validated and its performance characteristics determined by the reporting laboratory.  This laboratory is certified under the Clinical Laboratory Improvement Amendments CLIA as qualified to perform high complexity  clinical laboratory testing.    CIPROFLOXACIN  Value in next row Sensitive      2 SENSITIVEThis is a modified FDA-approved test that has been validated and its performance characteristics determined by the reporting laboratory.  This laboratory is certified under the Clinical Laboratory Improvement Amendments CLIA as qualified to perform high complexity clinical laboratory testing.    GENTAMICIN Value in next row Sensitive      2 SENSITIVEThis is a modified FDA-approved test that has been validated and its performance characteristics determined by the reporting laboratory.  This laboratory is certified under the Clinical Laboratory Improvement Amendments CLIA as qualified to perform high complexity clinical laboratory testing.    NITROFURANTOIN  Value in next row Intermediate      2 SENSITIVEThis is a modified FDA-approved test that has been validated and its performance characteristics determined by the reporting laboratory.  This laboratory is certified under the Clinical Laboratory Improvement Amendments CLIA as qualified to perform high complexity clinical laboratory testing.    TRIMETH/SULFA Value in next row Sensitive      2 SENSITIVEThis is a modified FDA-approved test that has been validated and its performance characteristics determined by the reporting laboratory.  This laboratory is certified under the Clinical Laboratory Improvement Amendments CLIA as qualified to perform high complexity clinical laboratory testing.    AMPICILLIN/SULBACTAM Value in next row Sensitive      2 SENSITIVEThis is a modified FDA-approved test that has been validated and its performance characteristics determined by the reporting laboratory.  This laboratory is certified under the Clinical Laboratory Improvement Amendments CLIA as qualified to perform high complexity clinical laboratory testing.    PIP/TAZO Value in next row Sensitive      <=4 SENSITIVEThis is a modified FDA-approved test that has been validated and  its performance characteristics determined by the reporting laboratory.  This laboratory is certified under the Clinical Laboratory Improvement Amendments CLIA as qualified to perform high complexity clinical laboratory testing.    MEROPENEM Value in next row Sensitive      <=  4 SENSITIVEThis is a modified FDA-approved test that has been validated and its performance characteristics determined by the reporting laboratory.  This laboratory is certified under the Clinical Laboratory Improvement Amendments CLIA as qualified to perform high complexity clinical laboratory testing.    * >=100,000 COLONIES/mL KLEBSIELLA PNEUMONIAE         Radiology Studies: No results found.       Scheduled Meds:  amLODipine   2.5 mg Oral Daily   aspirin  EC  81 mg Oral Daily   Chlorhexidine  Gluconate Cloth  6 each Topical Daily   finasteride   5 mg Oral QPM   rosuvastatin   20 mg Oral QPM   scopolamine   1 patch Transdermal Q72H   tamsulosin   0.4 mg Oral QPM   Continuous Infusions:   ceFAZolin  (ANCEF ) IV Stopped (09/15/24 0411)   lactated ringers  100 mL/hr at 09/15/24 0830     LOS: 3 days    Time spent: 40 minutes    Toribio Hummer, MD [facility]   To contact the attending provider between 7A-7P or the covering provider during after hours 7P-7A, please log into the web site [email or web address] and access using universal [place] password for that web site. If you do not have the password, please call the hospital operator.  09/15/2024, 1:01 PM    "

## 2024-09-15 NOTE — Progress Notes (Signed)
 Physical Therapy Treatment Patient Details Name: Edward [name] MRN: [id number] DOB: Dec 02, 1940 Today's Date: 09/15/2024   History of Present Illness 84 yo male admitted with sepsis, uti, obstructive uropathy, L mastoid effusion, fall. Hx of TIA, dizziness, OA, BPH, TIA, vertigo, CAD, COVID, Afib    PT Comments  PT seated in recliner with family present upon PT arrival, agreeable to therapy session. Amb to bathroom with RW at [facility], cues for slowed pace and attention to foley. Able to complete all bathroom tasks at [facility]. Increased gait distance to 253ft at close SPV/CGA with RW, intermittent cues for slowed speed and proximity to device. Pt intermittently impulsive throughout session requiring safety cues.  Education on continued use of RW following discharge and benefits of progressing with mobility. Discharge recommendations remain the same.    If plan is discharge home, recommend the following: A little help with walking and/or transfers;A little help with bathing/dressing/bathroom;Assistance with cooking/housework;Assist for transportation;Help with stairs or ramp for entrance   Can travel by private vehicle        Equipment Recommendations  Rolling walker (2 wheels)    Recommendations for Other Services       Precautions / Restrictions Precautions Precautions: Fall Recall of Precautions/Restrictions: Intact Restrictions Weight Bearing Restrictions Per Provider Order: No     Mobility  Bed Mobility               General bed mobility comments: pt seated in recliner upon PT arrival, returned to recliner at end of session    Transfers Overall transfer level: Needs assistance Equipment used: Rolling walker (2 wheels) Transfers: Sit to/from Stand Sit to Stand: Contact guard assist           General transfer comment: completes x3 during session from various surfaces with RW, VC to wait for PT to place DME completely in front of pt and additional safety cues for awareness  to foley    Ambulation/Gait Ambulation/Gait assistance: Contact guard assist, Supervision Gait Distance (Feet): 225 Feet Assistive device: Rolling walker (2 wheels)   Gait velocity: decreased     General Gait Details: close SPV/CGA for increased gait distance today, VC for improved proximity to RW and upright posture. He continues to be impulsive at times and need VC for slowed pace and safety   Stairs             Wheelchair Mobility     Tilt Bed    Modified Rankin (Stroke Patients Only)       Balance Overall balance assessment: Needs assistance, History of Falls Sitting-balance support: Feet supported, No upper extremity supported Sitting balance-Leahy Scale: Good     Standing balance support: Bilateral upper extremity supported, During functional activity, Reliant on assistive device for balance Standing balance-Leahy Scale: Fair Standing balance comment: reliant of RW with dynamic standing tasks, fair static standing balance and able to pick up objects off the floor at [facility]                            Communication Communication Communication: No apparent difficulties  Cognition Arousal: Alert Behavior During Therapy: WFL for tasks assessed/performed   PT - Cognitive impairments: Safety/Judgement                       PT - Cognition Comments: pt impulsive at times during session, hyperverbose and at times talking over PT when attempting to provide safety instructions and education Following commands:  Intact      Cueing Cueing Techniques: Verbal cues  Exercises      General Comments        Pertinent Vitals/Pain Pain Assessment Pain Assessment: No/denies pain    Home Living                          Prior Function            PT Goals (current goals can now be found in the care plan section) Acute Rehab PT Goals Patient Stated Goal: continue to feel better PT Goal Formulation: With patient Time For Goal  Achievement: 09/28/24 Potential to Achieve Goals: Good Progress towards PT goals: Progressing toward goals    Frequency    Min 3X/week      PT Plan      Co-evaluation              AM-PAC PT 6 Clicks Mobility   Outcome Measure  Help needed turning from your back to your side while in a flat bed without using bedrails?: A Little Help needed moving from lying on your back to sitting on the side of a flat bed without using bedrails?: A Little Help needed moving to and from a bed to a chair (including a wheelchair)?: A Little Help needed standing up from a chair using your arms (e.g., wheelchair or bedside chair)?: A Little Help needed to walk in hospital room?: A Little Help needed climbing 3-5 steps with a railing? : A Little 6 Click Score: 18    End of Session Equipment Utilized During Treatment: Gait belt Activity Tolerance: Patient tolerated treatment well Patient left: in chair;with call bell/phone within reach;with chair alarm set;with family/visitor present Nurse Communication: Mobility status;Other (comment) (impulsivity) PT Visit Diagnosis: Muscle weakness (generalized) (M62.81);Difficulty in walking, not elsewhere classified (R26.2);Unsteadiness on feet (R26.81)     Time: 8881-8858 PT Time Calculation (min) (ACUTE ONLY): 23 min  Charges:    $Gait Training: 8-22 mins $Therapeutic Activity: 8-22 mins                       Isaiah DEL. Cairo Lingenfelter, PT, DPT   [employer] 09/15/2024, 12:24 PM

## 2024-09-15 NOTE — Progress Notes (Signed)
 Continued right hydronephrosis on renal US . I made NPO after MN to consider cysto right ureteral stent tomorrow 1/30 depending on UOP and Cr recovery.

## 2024-09-15 NOTE — Evaluation (Signed)
 Occupational Therapy Evaluation Patient Details Name: Edward [name] MRN: [id number] DOB: [birth year] Today's Date: 09/15/2024   History of Present Illness   84 yo male admitted with sepsis, uti, obstructive uropathy, L mastoid effusion, fall. Hx of TIA, dizziness, OA, BPH, TIA, vertigo, CAD, COVID, Afib     Clinical Impressions Pt admitted with the above concerns. Pt currently with functional limitations due to the deficits listed below (see OT Problem List). Prior to admit, pt was living at home with his spouse and completing all daily tasks independently without use of AD. Pt will benefit from acute skilled OT to increase their safety and independence with ADL and functional mobility for ADL to facilitate discharge. Recommend follow up Home health OT to focus on endurance and activity tolerance.       If plan is discharge home, recommend the following:   A little help with walking and/or transfers;A little help with bathing/dressing/bathroom;Assist for transportation;Assistance with cooking/housework     Functional Status Assessment   Patient has had a recent decline in their functional status and demonstrates the ability to make significant improvements in function in a reasonable and predictable amount of time.     Equipment Recommendations   Tub/shower seat      Precautions/Restrictions   Precautions Precautions: Fall Recall of Precautions/Restrictions: Intact Restrictions Weight Bearing Restrictions Per Provider Order: No     Mobility Bed Mobility Overal bed mobility: Needs Assistance Bed Mobility: Supine to Sit, Sit to Supine     Supine to sit: Supervision, HOB elevated, Used rails Sit to supine: Supervision        Transfers Overall transfer level: Needs assistance Equipment used: None Transfers: Sit to/from Stand Sit to Stand: Contact guard assist           General transfer comment: VC for safety and direction. Pt able to side step while  standing at EOB.      Balance Overall balance assessment: Needs assistance, History of Falls Sitting-balance support: Feet supported, No upper extremity supported Sitting balance-Leahy Scale: Normal     Standing balance support: Bilateral upper extremity supported, During functional activity, Reliant on assistive device for balance Standing balance-Leahy Scale: Fair Standing balance comment: static standing      ADL either performed or assessed with clinical judgement   ADL       Grooming: Set up;Sitting   Upper Body Bathing: Set up;Sitting   Lower Body Bathing: Contact guard assist;Sitting/lateral leans;Sit to/from stand   Upper Body Dressing : Set up;Sitting   Lower Body Dressing: Contact guard assist;Sit to/from stand;Sitting/lateral leans   Toilet Transfer: Contact guard assist;Rolling walker (2 wheels);BSC/3in1   Toileting- Clothing Manipulation and Hygiene: Contact guard assist;Sit to/from stand               Vision Baseline Vision/History: 0 No visual deficits Ability to See in Adequate Light: 0 Adequate Patient Visual Report: No change from baseline Vision Assessment?: No apparent visual deficits     Perception Perception: Not tested       Praxis Praxis: Not tested       Pertinent Vitals/Pain Pain Assessment Pain Assessment: No/denies pain     Extremity/Trunk Assessment Upper Extremity Assessment Upper Extremity Assessment: Right hand dominant;LUE deficits/detail LUE Deficits / Details: history of RTC issues and surgery with limited ROM and strength at baseline. A/ROM shoulder flexion to ~90 degrees, abduction WFL. MMT: shoulder flexion 3-/5, abduction: 3+/5, IR/er: 3+/5. intact gross grasp.   Lower Extremity Assessment Lower Extremity Assessment: Defer to PT evaluation  Cervical / Trunk Assessment Cervical / Trunk Assessment: Normal   Communication Communication Communication: No apparent difficulties   Cognition Arousal:  Alert Behavior During Therapy: Impulsive Cognition: Cognition impaired       Memory impairment (select all impairments): Working memory Attention impairment (select first level of impairment): Alternating attention Executive functioning impairment (select all impairments): Problem solving    Following commands: Intact       Cueing  General Comments   Cueing Techniques: Verbal cues  VSS on RA           Home Living Family/patient expects to be discharged to:: Private residence Living Arrangements: Spouse/significant other Available Help at Discharge: Family Type of Home: House Home Access: Stairs to enter     Home Layout: Two level;Able to live on main level with bedroom/bathroom Alternate Level Stairs-Number of Steps: flight Alternate Level Stairs-Rails: Right Bathroom Shower/Tub: Tub/shower unit;Walk-in shower (walk-in downstairs, tub/shower upstairs)   Bathroom Toilet: Standard     Home Equipment: Cane - single point          Prior Functioning/Environment Prior Level of Function : Independent/Modified Independent           OT Problem List: Decreased strength;Impaired balance (sitting and/or standing);Decreased activity tolerance;Decreased knowledge of use of DME or AE;Decreased safety awareness   OT Treatment/Interventions: Self-care/ADL training;Therapeutic exercise;Therapeutic activities;DME and/or AE instruction;Balance training;Patient/family education;Energy conservation      OT Goals(Current goals can be found in the care plan section)   Acute Rehab OT Goals Patient Stated Goal: none stated OT Goal Formulation: With patient Time For Goal Achievement: 09/29/24 Potential to Achieve Goals: Good   OT Frequency:  Min 2X/week       AM-PAC OT 6 Clicks Daily Activity     Outcome Measure Help from another person eating meals?: None Help from another person taking care of personal grooming?: None Help from another person toileting, which  includes using toliet, bedpan, or urinal?: A Little Help from another person bathing (including washing, rinsing, drying)?: A Little Help from another person to put on and taking off regular upper body clothing?: None Help from another person to put on and taking off regular lower body clothing?: A Little 6 Click Score: 21   End of Session Nurse Communication: Mobility status  Activity Tolerance: Patient tolerated treatment well Patient left: in bed;with call bell/phone within reach;with bed alarm set;with family/visitor present  OT Visit Diagnosis: Muscle weakness (generalized) (M62.81)                Time: 8464-8440 OT Time Calculation (min): 24 min Charges:  OT General Charges $OT Visit: 1 Visit OT Evaluation $OT Eval Low Complexity: 1 Low OT Treatments $Self Care/Home Management : 8-22 mins  Leita Howell, OTR/L,CBIS  Supplemental OT - [facility] and [facility] Secure Chat Preferred    [place] Lebeau, Leita BIRCH 09/15/2024, 5:11 PM

## 2024-09-16 LAB — BASIC METABOLIC PANEL WITH GFR
Anion gap: 9 (ref 5–15)
BUN: 31 mg/dL — ABNORMAL HIGH (ref 8–23)
CO2: 22 mmol/L (ref 22–32)
Calcium: 9.1 mg/dL (ref 8.9–10.3)
Chloride: 113 mmol/L — ABNORMAL HIGH (ref 98–111)
Creatinine, Ser: 1.83 mg/dL — ABNORMAL HIGH (ref 0.61–1.24)
GFR, Estimated: 37 mL/min — ABNORMAL LOW
Glucose, Bld: 86 mg/dL (ref 70–99)
Potassium: 4.2 mmol/L (ref 3.5–5.1)
Sodium: 144 mmol/L (ref 135–145)

## 2024-09-16 LAB — CBC
HCT: 27 % — ABNORMAL LOW (ref 39.0–52.0)
Hemoglobin: 8.7 g/dL — ABNORMAL LOW (ref 13.0–17.0)
MCH: 30 pg (ref 26.0–34.0)
MCHC: 32.2 g/dL (ref 30.0–36.0)
MCV: 93.1 fL (ref 80.0–100.0)
Platelets: 157 10*3/uL (ref 150–400)
RBC: 2.9 MIL/uL — ABNORMAL LOW (ref 4.22–5.81)
RDW: 14.5 % (ref 11.5–15.5)
WBC: 7.6 10*3/uL (ref 4.0–10.5)
nRBC: 0 % (ref 0.0–0.2)

## 2024-09-16 MED ORDER — LORATADINE 10 MG PO TABS
10.0000 mg | ORAL_TABLET | Freq: Every day | ORAL | Status: DC
  Administered 2024-09-17: 10 mg via ORAL
  Filled 2024-09-16: qty 1

## 2024-09-16 MED ORDER — QUETIAPINE FUMARATE 25 MG PO TABS: 25.0000 mg | ORAL_TABLET | Freq: Every evening | ORAL | Status: DC | PRN

## 2024-09-16 MED ORDER — FLUTICASONE PROPIONATE 50 MCG/ACT NA SUSP
2.0000 | Freq: Every day | NASAL | Status: DC
  Administered 2024-09-16 – 2024-09-17 (×2): 2 via NASAL
  Filled 2024-09-16: qty 16

## 2024-09-16 MED ORDER — QUETIAPINE FUMARATE 25 MG PO TABS: 25.0000 mg | ORAL_TABLET | Freq: Every day | ORAL | Status: DC

## 2024-09-16 MED ORDER — CEFADROXIL 500 MG PO CAPS
500.0000 mg | ORAL_CAPSULE | Freq: Two times a day (BID) | ORAL | Status: DC
  Administered 2024-09-16 – 2024-09-17 (×3): 500 mg via ORAL
  Filled 2024-09-16 (×3): qty 1

## 2024-09-16 MED ORDER — CEFADROXIL 500 MG PO CAPS
1000.0000 mg | ORAL_CAPSULE | Freq: Two times a day (BID) | ORAL | Status: DC
  Filled 2024-09-16: qty 2

## 2024-09-16 NOTE — Progress Notes (Signed)
 Patient's foley removed, post void residual is 0. He has urinated .

## 2024-09-16 NOTE — Plan of Care (Signed)

## 2024-09-16 NOTE — Progress Notes (Signed)
 Overnight, night RN reported that pt had confusion, hallucinations, and agitation after administration of ancef . Pt may be exhibiting nephrotoxic from IV abx. Will report to primary MD.

## 2024-09-16 NOTE — TOC Progression Note (Addendum)
 Transition of Care [facility]) - Progression Note    Patient Details  Name: Edward [name] MRN: [id number] Date of Birth: 11/26/1940  Transition of Care [facility]) CM/SW Contact  Sonda Manuella Quill, RN Phone Number: 09/16/2024, 10:04 AM  Clinical Narrative:    Pt's spouse Tery Hoeger given list of accepting agencies for HHPT/OT Home Medical Care  Service Provider Request Status  [facility])  Accepted  [facility] ([facility])  Accepted  [facility])  Accepted  [facility]  Accepted  [facility])  Accepted  [facility])  Accepted      [facility])  Accepted   Awaiting choice.  -1207- spoke w/ pt's spouse in room; she has selected [facility]; agency notified via hub.  Expected Discharge Plan: Home w Home Health Services Barriers to Discharge: Continued Medical Work up               Expected Discharge Plan and Services In-house Referral: NA Discharge Planning Services: CM Consult   Living arrangements for the past 2 months: Single Family Home                 DME Arranged: N/A DME Agency: NA       HH Arranged: NA HH Agency: NA         Social Drivers of Health (SDOH) Interventions SDOH Screenings   Food Insecurity: No Food Insecurity (09/12/2024)  Housing: Low Risk (09/12/2024)  Transportation Needs: No Transportation Needs (09/12/2024)  Utilities: Not At Risk (09/12/2024)  Social Connections: Socially Integrated (09/12/2024)  Tobacco Use: Medium Risk (09/12/2024)    Readmission Risk Interventions    09/14/2024    2:24 PM  Readmission Risk Prevention Plan  Post Dischage Appt Complete  Medication Screening Complete  Transportation Screening Complete

## 2024-09-16 NOTE — Progress Notes (Addendum)
 S: Patient feeling well this morning.  He would like to eat and get the Foley out.  O: Vitals:   09/15/24 2019 09/16/24 0639  BP: (!) 162/63 (!) 164/69  Pulse: 68 68  Resp: 20 20  Temp: 97.8 F (36.6 C) 98.2 F (36.8 C)  SpO2: 94% 92%    Intake/Output Summary (Last 24 hours) at 09/16/2024 0941 Last data filed at 09/16/2024 0554 Gross per 24 hour  Intake 60 ml  Output 1500 ml  Net -1440 ml   He is in no acute distress, alert and oriented.  Here with his wife. Abdomen soft nontender Urine clear in Foley  Lab Results  Component Value Date   CREATININE 1.83 (H) 09/16/2024   CREATININE 2.13 (H) 09/15/2024   CREATININE 2.33 (H) 09/14/2024   CBC    Component Value Date/Time   WBC 7.6 09/16/2024 0714   RBC 2.90 (L) 09/16/2024 0714   HGB 8.7 (L) 09/16/2024 0714   HGB 11.2 (L) 02/28/2022 1245   HCT 27.0 (L) 09/16/2024 0714   HCT 34.1 (L) 02/28/2022 1245   PLT 157 09/16/2024 0714   PLT 176 02/28/2022 1245   MCV 93.1 09/16/2024 0714   MCV 93 02/28/2022 1245   MCH 30.0 09/16/2024 0714   MCHC 32.2 09/16/2024 0714   RDW 14.5 09/16/2024 0714   RDW 12.4 02/28/2022 1245   LYMPHSABS 0.8 09/15/2024 0530   LYMPHSABS 1.2 02/28/2022 1245   MONOABS 0.5 09/15/2024 0530   EOSABS 0.3 09/15/2024 0530   EOSABS 0.3 02/28/2022 1245   BASOSABS 0.0 09/15/2024 0530   BASOSABS 0.1 02/28/2022 1245   Renal US  -residual right hydronephrosis but measuring the system I do believe it is improved compared to the CT scan.  A/P: Status post bilateral ureteroscopy in December with residual right hydronephrosis and UTI.  Creatinine down to 1.8 today.  1.5 baseline.  I went over the ultrasound findings with the patient and his wife and discussed the nature risk benefits and alternatives to cystoscopy with right ureteral stent.  They like to go without the stent.  He has follow-up February 10 so we will recheck an ultrasound then.  -No procedure today, he can eat -Discontinue Foley catheter today for  voiding trial -Transition to p.o. antibiotics -Home in a.m. if labs stable and medically stable. -Appreciate excellent hospitalist care

## 2024-09-16 NOTE — Plan of Care (Signed)

## 2024-09-16 NOTE — Progress Notes (Signed)
 " PROGRESS NOTE    Edward [name]  FMW:[id number] DOB: [birth year] DOA: 09/12/2024 PCP: Janey Santos, MD    Chief Complaint  Patient presents with   Fall    Brief Narrative:  Edward [name] is a 84 y.o. male with medical history significant of osteoarthritis, sleep apnea, BPH, hyperlipidemia, hypertension, nocturia, history of TIA, history of vertigo, history of spermatocele he CAD, COVID-19, paroxysmal atrial fibrillation, history of Watchman left atrial appendage closure device, elevated PSA, nephrolithiasis who was brought via EMS to the emergency department after falling off the toilet, hitting his head on the right side and sustaining laceration.  Last month, he underwent a cystoscopy with ureteral dilatation, bilateral ureteroscopy laser lithotripsy and ureteral stent placement, Foley catheter. He denied fever, chills, rhinorrhea, sore throat, wheezing or hemoptysis.  Occasional chest discomfort with palpitations for which he takes as needed metoprolo.  He denied diaphoresis, PND, orthopnea or pitting edema of the lower extremities. No abdominal pain, nausea, emesis, diarrhea, constipation, melena or hematochezia.  Mild flank pain, but no dysuria, frequency or hematuria. No polyuria, polydipsia, polyphagia or blurred vision.      Assessment & Plan:   Principal Problem:   Sepsis secondary to UTI [facility]) Active Problems:   Coronary artery disease   Hypercholesterolemia   Hypertension   Paroxysmal atrial fibrillation (HCC)   Iron deficiency anemia   Hydronephrosis with renal and ureteral calculus obstruction   AKI (acute kidney injury)   Acute cystitis with hematuria   Fall   Syncope, vasovagal  #1 sepsis secondary to Klebsiella pneumonia UTI -Patient on admission met criteria for sepsis secondary to tachycardia, significant leukocytosis, urinalysis consistent with a UTI. - Leukocytosis on admission with a white count of 20.4 which has trended down currently at 13.7. - Blood  cultures with no growth to date x 4 days. - Urine cultures with > 100,000 colonies of Klebsiella pneumonia. - Urine culture is resistant to ampicillin with intermediate sensitivity to nitrofurantoin , sensitive to cephalosporins, ertapenem, ciprofloxacin , gentamicin, Bactrim, Unasyn, Zosyn, meropenem. - Patient with clinical improvement - Patient was on IV cefepime  and has been narrowed down to IV Ancef . - Will transition from IV Ancef  to Duricef to complete 7 to 10-day course of antibiotic treatment.  - Urology following.   2.  Acute kidney injury/acute renal failure secondary to obstructive uropathy/bilateral hydronephrosis/ -BPH/urinary retention/urethral stricture  -Patient noted on CT abdomen and pelvis distended bladder and bilateral hydroureteronephrosis right greater than left. -Baseline creatinine approximately 1.5. - Creatinine on admission noted at 2.89, increased to 3.3 and trending back down currently at 1.83. -Urine output of 1.950 L over the past 24 hours. - Patient seen by urology in consultation, Foley catheter placed per urology. - Urology recommended repeat renal ultrasound which was done on 09/15/2024 which showed moderate right hydronephrosis, similar to prior examinations, no calyceal stone or mass identified, 5 mm shadowing stone in the inferior pole of the left kidney.  No left hydronephrosis or mass.   - Patient being followed by urology and recommending discontinuation of Foley with voiding trial today, transitioning to oral antibiotics and if labs stable and patient medically stable could potentially be discharged home in the next 24 hours with outpatient follow-up which has been set for 09/27/2024 and repeat ultrasound to be done at that time.   - Saline lock IV fluids  - Transition from IV to oral antibiotics.   - Continue Flomax , Proscar .   - Per urology.   3.  Probable vasovagal syncope versus presyncope -  Patient does state that he thinks he may have passed out and  has had episodes like this before in the past that have been thoroughly worked up. -CT head and CT C-spine negative. - Patient noted to have been on the commode having a bowel movement prior to his symptoms. - Orthostatics were checked which were negative. - Continue TED hose.   - Outpatient follow-up with PCP.   4.  Complicated UTI -Patient with right perinephric stranding noted on CT scan and currently denies any flank pain.  Urine cultures with Klebsiella, leukocytosis trending down. - Was on IV cefepime  has been narrowed to IV Ancef . - Transition from IV Ancef  to Duricef to complete a 7 to 10-day course of antibiotic treatment.   - Urology following.   5.  Hyperkalemia -Likely secondary to AKI. - Status post Lokelma , insulin , EKG. - Foley catheter discontinued today per urology for voiding trial.   - Potassium of 4.2 today.  6.  Hypertension - BP noted to be soft on admission and as such patient's Cozaar  and Norvasc  initially held. - Continue Norvasc  2.5 mg daily. - Continue to hold Cozaar  as patient had presented with AKI.   7.  A-fib -Currently rate controlled. -Patient noted to be on metoprolol  as needed prior to admission. - Patient not on anticoagulation. - Continue aspirin  81 mg daily.   8.  Hyperlipidemia - Continue statin.  9.  Fall -PT/OT.  10.  Hallucinations -Patient noted overnight with hallucinations and agitation which patient endorses. -??  Sundowning. - Place on Seroquel  25 mg nightly as needed.    DVT prophylaxis: SCDs Code Status: Full Family Communication: Updated patient, wife, at bedside. Disposition: Likely home when clinically improved, improvement in renal function and cleared by urology hopefully in the next 24 to 48 hours..  Status is: Inpatient Remains inpatient appropriate because: Severity of illness   Consultants:  Urology: Dr. Carolee 09/13/2024  Procedures:  CT head 09/12/2024 CT C-spine 09/12/2024 CT maxillofacial 09/12/2024 CT  chest abdomen and pelvis 09/12/2024  Antimicrobials:  Anti-infectives (From admission, onward)    Start     Dose/Rate Route Frequency Ordered Stop   09/16/24 1000  cefadroxil  (DURICEF) capsule 1,000 mg  Status:  Discontinued        1,000 mg Oral 2 times daily 09/16/24 0847 09/16/24 0904   09/16/24 1000  cefadroxil  (DURICEF) capsule 500 mg        500 mg Oral 2 times daily 09/16/24 0904 09/22/24 0959   09/14/24 1600  ceFAZolin  (ANCEF ) IVPB 1 g/50 mL premix  Status:  Discontinued        1 g 100 mL/hr over 30 Minutes Intravenous Every 12 hours 09/14/24 1102 09/16/24 0847   09/12/24 1700  ceFEPIme  (MAXIPIME ) 2 g in sodium chloride  0.9 % 100 mL IVPB  Status:  Discontinued        2 g 200 mL/hr over 30 Minutes Intravenous Every 24 hours 09/12/24 1630 09/14/24 1102   09/12/24 1630  ceFEPIme  (MAXIPIME ) 2 g in sodium chloride  0.9 % 100 mL IVPB  Status:  Discontinued        2 g 200 mL/hr over 30 Minutes Intravenous Every 12 hours 09/12/24 1627 09/12/24 1630   09/12/24 1530  cefTRIAXone  (ROCEPHIN ) 2 g in sodium chloride  0.9 % 100 mL IVPB        2 g 200 mL/hr over 30 Minutes Intravenous  Once 09/12/24 1524 09/12/24 1618         Subjective: Patient sitting up in bed  eating pancakes.  Denies any chest pain or shortness of breath.  Some complaints of a postnasal drip as he feels a little bit congested.  Denies any chest pain no abdominal pain.  States saw urologist and no procedure planned today and Foley catheter has been removed for voiding trial.  Wife at bedside.  Patient alert and oriented to self place and time.  Patient does endorse hallucinations last night which has since resolved.   Objective: Vitals:   09/15/24 0431 09/15/24 1340 09/15/24 2019 09/16/24 0639  BP: (!) 145/52 (!) 141/57 (!) 162/63 (!) 164/69  Pulse: 60 (!) 58 68 68  Resp: 16 20 20 20   Temp: 98.1 F (36.7 C) 98.4 F (36.9 C) 97.8 F (36.6 C) 98.2 F (36.8 C)  TempSrc: Oral Oral Oral Oral  SpO2: 95%  94% 92%  Weight:       Height:        Intake/Output Summary (Last 24 hours) at 09/16/2024 1048 Last data filed at 09/16/2024 9177 Gross per 24 hour  Intake 60 ml  Output 1950 ml  Net -1890 ml   Filed Weights   09/12/24 1600  Weight: 67.1 kg    Examination:  General exam: NAD. Respiratory system: Lungs clear to auscultation bilaterally.  No wheezes, no crackles, no rhonchi.  Speaking in full sentences. Cardiovascular system: Regular rate rhythm no murmurs rubs or gallops.  No JVD.  No pitting lower extremity edema. Gastrointestinal system: Abdomen soft, nontender, nondistended, positive bowel sounds.  No rebound.  No guarding.  Central nervous system: Alert and oriented. No focal neurological deficits. Extremities: Symmetric 5 x 5 power. Skin: No rashes, lesions or ulcers Psychiatry: Judgement and insight appear normal. Mood & affect appropriate.     Data Reviewed: I have personally reviewed following labs and imaging studies  CBC: Recent Labs  Lab 09/12/24 1440 09/13/24 0546 09/14/24 1035 09/15/24 0530 09/16/24 0714  WBC 20.4* 18.3* 13.7* 9.9 7.6  NEUTROABS 19.4*  --  12.0* 8.2*  --   HGB 10.8* 9.8* 8.6* 8.1* 8.7*  HCT 33.9* 30.2* 26.6* 24.9* 27.0*  MCV 93.9 92.6 92.7 92.9 93.1  PLT 277 201 174 162 157    Basic Metabolic Panel: Recent Labs  Lab 09/13/24 0546 09/13/24 1559 09/14/24 1035 09/15/24 0530 09/16/24 0714  NA 137 136 142 141 144  K 5.6* 5.3* 4.6 4.3 4.2  CL 107 107 111 111 113*  CO2 19* 21* 23 21* 22  GLUCOSE 103* 139* 97 101* 86  BUN 46* 50* 47* 38* 31*  CREATININE 3.33* 2.84* 2.33* 2.13* 1.83*  CALCIUM  9.4 9.1 9.0 9.0 9.1    GFR: Estimated Creatinine Clearance: 28.6 mL/min (A) (by C-G formula based on SCr of 1.83 mg/dL (H)).  Liver Function Tests: Recent Labs  Lab 09/12/24 1440 09/13/24 0546  AST 25 69*  ALT 22 30  ALKPHOS 65 61  BILITOT 0.3 0.4  PROT 6.7 6.6  ALBUMIN 3.4* 3.2*    CBG: Recent Labs  Lab 09/13/24 1657  GLUCAP 126*     Recent  Results (from the past 240 hours)  Culture, blood (routine x 2)     Status: None (Preliminary result)   Collection Time: 09/12/24  4:10 PM   Specimen: BLOOD  Result Value Ref Range Status   Specimen Description   Final    BLOOD RIGHT ANTECUBITAL Performed at [facility], [address] [place], [place] [address]    Special Requests   Final    BOTTLES DRAWN  AEROBIC AND ANAEROBIC Blood Culture adequate volume Performed at [facility], [address], [place], [place] [address]    Culture   Final    NO GROWTH 4 DAYS Performed at [facility], [address] [place], [place] [address]    Report Status PENDING  Incomplete  Culture, blood (routine x 2)     Status: None (Preliminary result)   Collection Time: 09/12/24  4:10 PM   Specimen: BLOOD  Result Value Ref Range Status   Specimen Description   Final    BLOOD LEFT ANTECUBITAL Performed at [facility], [address], [place], [place] [address]    Special Requests   Final    BOTTLES DRAWN AEROBIC AND ANAEROBIC Blood Culture adequate volume Performed at [facility], [address] [place], [place] [address]    Culture   Final    NO GROWTH 4 DAYS Performed at [facility], [address] [place], [place] [address]    Report Status PENDING  Incomplete  Urine Culture     Status: Abnormal   Collection Time: 09/12/24  5:05 PM   Specimen: Urine, Clean Catch  Result Value Ref Range Status   Specimen Description   Final    URINE, CLEAN CATCH Performed at [facility], [address] [place], [place] [address]    Special Requests   Final    NONE Performed at [facility], [address], [place], [place] [address]    Culture >=100,000 COLONIES/mL KLEBSIELLA PNEUMONIAE (A)  Final   Report Status 09/14/2024 FINAL  Final   Organism ID, Bacteria KLEBSIELLA PNEUMONIAE (A)  Final      Susceptibility    Klebsiella pneumoniae - MIC*    AMPICILLIN >=32 RESISTANT Resistant     CEFAZOLIN  (URINE) Value in next row Sensitive      2 SENSITIVEThis is a modified FDA-approved test that has been validated and its performance characteristics determined by the reporting laboratory.  This laboratory is certified under the Clinical Laboratory Improvement Amendments CLIA as qualified to perform high complexity clinical laboratory testing.    CEFEPIME  Value in next row Sensitive      2 SENSITIVEThis is a modified FDA-approved test that has been validated and its performance characteristics determined by the reporting laboratory.  This laboratory is certified under the Clinical Laboratory Improvement Amendments CLIA as qualified to perform high complexity clinical laboratory testing.    ERTAPENEM Value in next row Sensitive      2 SENSITIVEThis is a modified FDA-approved test that has been validated and its performance characteristics determined by the reporting laboratory.  This laboratory is certified under the Clinical Laboratory Improvement Amendments CLIA as qualified to perform high complexity clinical laboratory testing.    CEFTRIAXONE  Value in next row Sensitive      2 SENSITIVEThis is a modified FDA-approved test that has been validated and its performance characteristics determined by the reporting laboratory.  This laboratory is certified under the Clinical Laboratory Improvement Amendments CLIA as qualified to perform high complexity clinical laboratory testing.    CIPROFLOXACIN  Value in next row Sensitive      2 SENSITIVEThis is a modified FDA-approved test that has been validated and its performance characteristics determined by the reporting laboratory.  This laboratory is certified under the Clinical Laboratory Improvement Amendments CLIA as qualified to perform high complexity clinical laboratory testing.    GENTAMICIN Value in next row Sensitive  2 SENSITIVEThis is a modified FDA-approved test that  has been validated and its performance characteristics determined by the reporting laboratory.  This laboratory is certified under the Clinical Laboratory Improvement Amendments CLIA as qualified to perform high complexity clinical laboratory testing.    NITROFURANTOIN  Value in next row Intermediate      2 SENSITIVEThis is a modified FDA-approved test that has been validated and its performance characteristics determined by the reporting laboratory.  This laboratory is certified under the Clinical Laboratory Improvement Amendments CLIA as qualified to perform high complexity clinical laboratory testing.    TRIMETH/SULFA Value in next row Sensitive      2 SENSITIVEThis is a modified FDA-approved test that has been validated and its performance characteristics determined by the reporting laboratory.  This laboratory is certified under the Clinical Laboratory Improvement Amendments CLIA as qualified to perform high complexity clinical laboratory testing.    AMPICILLIN/SULBACTAM Value in next row Sensitive      2 SENSITIVEThis is a modified FDA-approved test that has been validated and its performance characteristics determined by the reporting laboratory.  This laboratory is certified under the Clinical Laboratory Improvement Amendments CLIA as qualified to perform high complexity clinical laboratory testing.    PIP/TAZO Value in next row Sensitive      <=4 SENSITIVEThis is a modified FDA-approved test that has been validated and its performance characteristics determined by the reporting laboratory.  This laboratory is certified under the Clinical Laboratory Improvement Amendments CLIA as qualified to perform high complexity clinical laboratory testing.    MEROPENEM Value in next row Sensitive      <=4 SENSITIVEThis is a modified FDA-approved test that has been validated and its performance characteristics determined by the reporting laboratory.  This laboratory is certified under the Clinical Laboratory  Improvement Amendments CLIA as qualified to perform high complexity clinical laboratory testing.    * >=100,000 COLONIES/mL KLEBSIELLA PNEUMONIAE         Radiology Studies: US  RENAL Result Date: 09/15/2024 EXAM: US  BILATERAL KIDNEY 09/15/2024 05:24:00 PM TECHNIQUE: Real-time ultrasonography of the retroperitoneum, specifically the kidneys and urinary bladder, was performed. COMPARISON: US  RENAL 11/05/2023, CT abdomen and pelvis without contrast 09/02/2024, and CT chest abdomen and pelvis without contrast 09/12/2024. CLINICAL HISTORY: Hydronephrosis, right. FINDINGS: RIGHT KIDNEY: Right kidney measures 11.2 x 5.0 x 6.4 cm. Normal cortical echogenicity. There is moderate hydronephrosis, which was seen on both recent prior CT scans. No calyceal stones or mass identified. LEFT KIDNEY: Left kidney measures 10.0 x 4.6 x 4.3 cm. Normal cortical echogenicity. A 5 mm shadowing stone in the inferior pole was again noted. No hydronephrosis or mass is seen. BLADDER: The bladder is catheterized, contracted, and unable to be evaluated. IMPRESSION: 1. Moderate right hydronephrosis, similar to prior examinations. No calyceal stone or mass identified. 2. 5 mm shadowing stone in the inferior pole of the left kidney. No left hydronephrosis or mass. Electronically signed by: Francis Quam MD 09/15/2024 10:23 PM EST RP Workstation: [id number]         Scheduled Meds:  amLODipine   2.5 mg Oral Daily   aspirin  EC  81 mg Oral Daily   cefadroxil   500 mg Oral BID   Chlorhexidine  Gluconate Cloth  6 each Topical Daily   finasteride   5 mg Oral QPM   QUEtiapine   25 mg Oral QHS   rosuvastatin   20 mg Oral QPM   scopolamine   1 patch Transdermal Q72H   tamsulosin   0.4 mg Oral QPM   Continuous Infusions:  LOS: 4 days    Time spent: 40 minutes    Toribio Hummer, MD [facility]   To contact the attending provider between 7A-7P or the covering provider during after hours 7P-7A, please log into the web  site [email or web address] and access using universal [place] password for that web site. If you do not have the password, please call the hospital operator.  09/16/2024, 10:48 AM    "

## 2024-09-17 ENCOUNTER — Other Ambulatory Visit (HOSPITAL_COMMUNITY): Payer: Self-pay

## 2024-09-17 LAB — BASIC METABOLIC PANEL WITH GFR
Anion gap: 7 (ref 5–15)
BUN: 25 mg/dL — ABNORMAL HIGH (ref 8–23)
CO2: 24 mmol/L (ref 22–32)
Calcium: 9 mg/dL (ref 8.9–10.3)
Chloride: 111 mmol/L (ref 98–111)
Creatinine, Ser: 1.69 mg/dL — ABNORMAL HIGH (ref 0.61–1.24)
GFR, Estimated: 40 mL/min — ABNORMAL LOW
Glucose, Bld: 89 mg/dL (ref 70–99)
Potassium: 4.3 mmol/L (ref 3.5–5.1)
Sodium: 143 mmol/L (ref 135–145)

## 2024-09-17 LAB — CBC
HCT: 27.1 % — ABNORMAL LOW (ref 39.0–52.0)
Hemoglobin: 8.4 g/dL — ABNORMAL LOW (ref 13.0–17.0)
MCH: 29.4 pg (ref 26.0–34.0)
MCHC: 31 g/dL (ref 30.0–36.0)
MCV: 94.8 fL (ref 80.0–100.0)
Platelets: 157 10*3/uL (ref 150–400)
RBC: 2.86 MIL/uL — ABNORMAL LOW (ref 4.22–5.81)
RDW: 14.1 % (ref 11.5–15.5)
WBC: 7.6 10*3/uL (ref 4.0–10.5)
nRBC: 0 % (ref 0.0–0.2)

## 2024-09-17 LAB — CULTURE, BLOOD (ROUTINE X 2)
Culture: NO GROWTH
Culture: NO GROWTH
Special Requests: ADEQUATE
Special Requests: ADEQUATE

## 2024-09-17 MED ORDER — CEFADROXIL 500 MG PO CAPS
500.0000 mg | ORAL_CAPSULE | Freq: Two times a day (BID) | ORAL | 0 refills | Status: AC
  Filled 2024-09-17: qty 10, 5d supply, fill #0

## 2024-09-17 NOTE — Plan of Care (Signed)
 ?  Problem: Clinical Measurements: ?Goal: Ability to maintain clinical measurements within normal limits will improve ?Outcome: Progressing ?Goal: Will remain free from infection ?Outcome: Progressing ?Goal: Diagnostic test results will improve ?Outcome: Progressing ?  ?

## 2024-09-17 NOTE — TOC Progression Note (Signed)
 Transition of Care [facility]) - Progression Note    Patient Details  Name: Edward [name] MRN: [id number] Date of Birth: 12/16/1940  Transition of Care [facility] CM/SW Contact  Sonda Manuella Quill, RN Phone Number: 09/17/2024, 1:08 PM  Clinical Narrative:    Unable to provide transportation via [employer]; spoke w/ Darleene at agency; he said agency not transporting from [place]; spoke w/ pt's wife Edward [name] [phone]); pt's wife will provide transport; and will arrive app 2pm; Allegra, RN and requested to Ivey Schuller [phone]) to give d/c instructions.   Expected Discharge Plan: Home w Home Health Services Barriers to Discharge: Continued Medical Work up               Expected Discharge Plan and Services In-house Referral: NA Discharge Planning Services: CM Consult   Living arrangements for the past 2 months: Single Family Home Expected Discharge Date: 09/17/24               DME Arranged: N/A DME Agency: NA       HH Arranged: PT, OT HH Agency: [facility] Date HH Agency Contacted: 09/16/24 Time HH Agency Contacted: 1212 Representative spoke with at [facility] Agency: [facility]   Social Drivers of Health (SDOH) Interventions SDOH Screenings   Food Insecurity: No Food Insecurity (09/12/2024)  Housing: Low Risk (09/12/2024)  Transportation Needs: No Transportation Needs (09/12/2024)  Utilities: Not At Risk (09/12/2024)  Social Connections: Socially Integrated (09/12/2024)  Tobacco Use: Medium Risk (09/12/2024)    Readmission Risk Interventions    09/14/2024    2:24 PM  Readmission Risk Prevention Plan  Post Dischage Appt Complete  Medication Screening Complete  Transportation Screening Complete

## 2024-09-17 NOTE — Assessment & Plan Note (Signed)
 No further events Orthostatic vital signs negative Patient will continue PT and OT at home   Acute metabolic encephalopathy with hallucinations, Likely sundowning, at the time of discharge has resolved.

## 2024-09-17 NOTE — Assessment & Plan Note (Signed)
No chest pain, no acute coronary syndrome.  

## 2024-09-17 NOTE — Assessment & Plan Note (Addendum)
 Continue blood pressure control with amlodipine , at the time of discharge resume losartan  and metoprolol .

## 2024-09-17 NOTE — Assessment & Plan Note (Signed)
 Cell count has been stable.  Follow up as outpatient.

## 2024-09-17 NOTE — Discharge Summary (Addendum)
 " Physician Discharge Summary   Patient: Edward [name] MRN: [id number] DOB: [birth year]  Admit date:     09/12/2024  Discharge date: 09/17/24  Discharge Physician: Elidia Sieving Antavious Spanos   PCP: Avva, Ravisankar, MD   Recommendations at discharge:   Patient will continue Cefadroxil  for 5 more days. Follow up with outpatient renal US  with urology on February 9 Follow up renal function and electrolytes as outpatient in 7 days Follow up with Dr Janey in 7 to 10 days Follow up with Urology as scheduled   I spoke with patient's wife over the phone, we talked in detail about patient's condition, plan of care and prognosis and all questions were addressed.  Discharge Diagnoses: Principal Problem:   Sepsis secondary to UTI [facility]) Active Problems:   Coronary artery disease   Hypercholesterolemia   Hypertension   Paroxysmal atrial fibrillation (HCC)   Iron deficiency anemia   Hydronephrosis with renal and ureteral calculus obstruction   AKI (acute kidney injury)   Acute cystitis with hematuria   Fall   Syncope, vasovagal  Resolved Problems:   * No resolved hospital problems. [facility] Course: Mr. Bohr was admitted to the hospital with the working diagnosis of complicated urinary tract infection with hydronephrosis and ureteral calculus obstructive uropathy.   84 yo male with the past medical history of osteoarthritis, benign prostate hypertrophy, hyperlipidemia, hypertension, atrial fibrillation and history of TIA who presented with after a mechanical fall.  Patient fell from the toilet, hitting his head on the right side and sustaining a laceration.  EMS was called and he was transported to the ED.  Recent lithotripsy and ureteral stent placement, foley catheter in place.  On his initial physical examination his blood pressure was 126/110, 139/57, HR 115, RR 20 and 02 saturation 96% on room air Lungs with no wheezing or rhonchi, heart with S1 and S2 present and regular, tachycardic,  abdomen with no distention and no lower extremity edema. Positive contusion and laceration in his right frontal area.   Na 142, K 5.1 Cl 113 bicarbonate 17 glucose 94 bun 41 cr 2,89  AST 25 ALT 22  Wbc 20,0 hgb 10.8 plt 277  Urine analysis SG 1,012, protein negative, positive nitrites, large leukocytes and small hgb, rbc 11-20 wbc >50  Blood cultures with no growth  Urine culture positive for Klebsiella >100,000 CFU, sensitive to cephalosporins and resistant to ampicillin   CT head with no evidence of intracranial abnormality Atrophy and chronic small vessel ischemic changes of the white matter.  CT cervical spine with straightening of the cervical spine with diffuse degenerative changes. No acute osseous abnormality.  CT maxillofacial with no evidence of acute facial bone fracture.  Left mastoid effusion  CT chest abdomen and pelvis with no evidence of acute traumatic injury   EKG 110 bpm, normal axis, normal intervals, qtc 470, sinus rhythm with small q wave lead II, III, aVF, no significant ST segment or T wave changes, positive LVH.   Patient was placed on IV antibiotic therapy  Renal US  with moderate right hydronephrosis, similar to prior examinations. No calyceal stone or mass identified.  5 mm shading stone in the inferior pole of the left kidney, no left hydronephrosis of mass.   Urology consulted, removed foley with good toleration.  01/31 plan for follow up as outpatient.    Assessment and Plan: * Sepsis secondary to UTI (HCC) Sepsis present on admission, now resolved.  Urine infection related to foley catheter present on admission. Right  hydronephrosis, BPH, urethral stricture.   Urine culture positive for Klebsiella pneumonia sensitive to cephalosporins Patient was placed initially on broad spectrum antibiotic with cefepime  then transitioned to cefazolin .  Urology was consulted foley catheter was removed with good toleration  Plan for follow up with Urology on Feb 9 for  renal US  in the office.  Continue with finesteride and tamsulosin    Initially planned for cystoscopy and right ureteral stent placement, but after discussion with patient and his wife they opted for more conservative approach.   Coronary artery disease No chest pain, no acute coronary syndrome   Chronic kidney disease, stage 3a (HCC) AKI, hyperkalemia (baseline serum cr at 1,4)  Renal function with peaked serum cr at 3.3    At the time of his discharge his renal function continue to improve with serum cr at 1,69 with K at 4,3 and serum bicarbonate at 25 and Na 143   Plan to follow up renal function and electrolytes as outpatient  Avoid nephrotoxic medications   Hypertension Continue blood pressure control with amlodipine , at the time of discharge resume losartan  and metoprolol .   Paroxysmal atrial fibrillation (HCC) Continue aspirin   Patient not on direct oral anticoagulation  Continue rate control with metoprolol .   Hypercholesterolemia Continue with statin therapy   Iron deficiency anemia Cell count has been stable  Follow up as outpatient   Syncope, vasovagal No further events Orthostatic vital signs negative Patient will continue PT and OT at home   Acute metabolic encephalopathy with hallucinations, Likely sundowning, at the time of discharge has resolved.          Consultants: Urology  Procedures performed: none   Disposition: Home Diet recommendation:  Cardiac diet DISCHARGE MEDICATION: Allergies as of 09/17/2024       Reactions   Augmentin [amoxicillin-pot Clavulanate] Diarrhea   Codeine Nausea Only   Levaquin [levofloxacin] Other (See Comments)   Severe stomach pain   Zocor [simvastatin] Other (See Comments)   Muscle pain        Medication List     STOP taking these medications    cephALEXin  500 MG capsule Commonly known as: KEFLEX        TAKE these medications    amLODipine  2.5 MG tablet Commonly known as: NORVASC  Take 2.5 mg by  mouth every evening. What changed: Another medication with the same name was removed. Continue taking this medication, and follow the directions you see here.   aspirin  EC 81 MG tablet Take 81 mg by mouth daily. Swallow whole.   cefadroxil  500 MG capsule Commonly known as: DURICEF Take 1 capsule (500 mg total) by mouth 2 (two) times daily for 5 days.   CoQ-10 200 MG Caps Take 200 mg by mouth in the morning.   finasteride  5 MG tablet Commonly known as: PROSCAR  Take 5 mg by mouth every evening.   losartan  100 MG tablet Commonly known as: Cozaar  Take 1 tablet (100 mg total) by mouth daily. What changed: when to take this   metoprolol  tartrate 25 MG tablet Commonly known as: LOPRESSOR  Take 1 tab twice daily as needed only for heart rate greater than 100 beats per minute.   rosuvastatin  20 MG tablet Commonly known as: CRESTOR  Take 1 tablet (20 mg total) by mouth every evening.   tamsulosin  0.4 MG Caps capsule Commonly known as: FLOMAX  Take 0.4 mg by mouth every evening.   TYLENOL  500 MG tablet Generic drug: acetaminophen  Take 500 mg by mouth See admin instructions. Take 500 mg by mouth  in the evening and an additional 500 mg at noontime as needed for discomfort        Contact information for follow-up providers     Nieves Cough, MD Follow up on 09/27/2024.   Specialty: Urology Why: at 10:15 AM for renal US  and visit with PA [facility] information: [address] [place] [address] [phone]              Contact information for after-discharge care     Home Medical Care     [facility]) .   Service: Home Health Services Contact information: [address] [place]  [address] [phone]                    Discharge Exam: Fredricka Weights   09/12/24 1600  Weight: 67.1 kg   BP (!) 146/52 (BP Location: Right Arm)   Pulse 60   Temp 98.6 F (37 C)   Resp 18   Ht 5'  6 (1.676 m)   Wt 67.1 kg   SpO2 94%   BMI 23.89 kg/m   Patient is feeling well, no chest pain and no dyspnea, no nausea or vomiting.  Able to void urine with no problems, no further syncope   Neurology awake and alert ENT with mild pallor with no icterus Cardiovascular with S1 and S2 present and regular, positive diastolic murmur at the apex No JVD Respiratory with no rales or wheezing, no rhonchi Abdomen with no distention  No lower extremity edema   Condition at discharge: stable  The results of significant diagnostics from this hospitalization (including imaging, microbiology, ancillary and laboratory) are listed below for reference.   Imaging Studies: US  RENAL Result Date: 09/15/2024 EXAM: US  BILATERAL KIDNEY 09/15/2024 05:24:00 PM TECHNIQUE: Real-time ultrasonography of the retroperitoneum, specifically the kidneys and urinary bladder, was performed. COMPARISON: US  RENAL 11/05/2023, CT abdomen and pelvis without contrast 09/02/2024, and CT chest abdomen and pelvis without contrast 09/12/2024. CLINICAL HISTORY: Hydronephrosis, right. FINDINGS: RIGHT KIDNEY: Right kidney measures 11.2 x 5.0 x 6.4 cm. Normal cortical echogenicity. There is moderate hydronephrosis, which was seen on both recent prior CT scans. No calyceal stones or mass identified. LEFT KIDNEY: Left kidney measures 10.0 x 4.6 x 4.3 cm. Normal cortical echogenicity. A 5 mm shadowing stone in the inferior pole was again noted. No hydronephrosis or mass is seen. BLADDER: The bladder is catheterized, contracted, and unable to be evaluated. IMPRESSION: 1. Moderate right hydronephrosis, similar to prior examinations. No calyceal stone or mass identified. 2. 5 mm shadowing stone in the inferior pole of the left kidney. No left hydronephrosis or mass. Electronically signed by: Francis Quam MD 09/15/2024 10:23 PM EST RP Workstation: [id number]   CT CHEST ABDOMEN PELVIS WO CONTRAST Result Date: 09/12/2024 EXAM: CT CHEST, ABDOMEN  AND PELVIS WITHOUT CONTRAST 09/12/2024 03:45:11 PM TECHNIQUE: CT of the chest, abdomen and pelvis was performed without the administration of intravenous contrast. Multiplanar reformatted images are provided for review. Automated exposure control, iterative reconstruction, and/or weight based adjustment of the mA/kV was utilized to reduce the radiation dose to as low as reasonably achievable. COMPARISON: CT abdomen and pelvis 09/02/2024, cardiac CT 03/10/2022, chest radiograph 05/06/2023. CLINICAL HISTORY: Polytrauma, blunt. Patient fell, striking the right side. Dizziness. FINDINGS: CHEST: MEDIASTINUM AND LYMPH NODES: Heart: Normal heart size. Cage prosthesis in the left atrial appendage. Calcification in the coronary arteries. No pericardial effusions. Vessels: Calcification in the aorta. Normal caliber thoracic  aorta. Airways: The central airways are clear. Lymph Nodes: No mediastinal, hilar or axillary lymphadenopathy. Other: The thyroid gland is unremarkable. The esophagus is decompressed. There are no mediastinal fluid collections. LUNGS AND PLEURA: Lungs: Mild dependent atelectasis in the lung bases. No airspace disease or consolidation in the lungs. Pleura: No pleural effusion or pneumothorax. ABDOMEN AND PELVIS: LIVER: Unremarkable. GALLBLADDER AND BILE DUCTS: Unremarkable. No biliary ductal dilatation. SPLEEN: No acute abnormality. PANCREAS: No acute abnormality. ADRENAL GLANDS: No adrenal gland nodules. KIDNEYS, URETERS AND BLADDER: Kidneys: Lower pole left intrarenal stone measuring 4 mm diameter. Mild left hydronephrosis and hydroureter, possibly due to reflux. The right kidney is asymmetrically enlarged with stranding around the right kidney. Moderate right hydronephrosis and hydroureter, similar to prior study. Changes could be due to reflux, pyelonephritis, occult stone, or recently passed stone. Ureters: No ureteral stones are demonstrated. Bladder: Mild cellular formation of the bladder wall may  indicate evidence of outlet obstruction. No discrete wall thickening, stone, or filling defect in the bladder. Prostate: The prostate gland is enlarged. The posterior urethra appears mildly dilated. GI AND BOWEL: Stomach: Stomach demonstrates no acute abnormality. Bowel: The small bowel and colon are not abnormally distended. No wall thickening or inflammatory stranding is appreciated. Scattered colonic diverticula without evidence of acute diverticulitis. The appendix is not seen. There is no bowel obstruction. REPRODUCTIVE ORGANS: The prostate gland is enlarged. The posterior urethra appears mildly dilated. PERITONEUM AND RETROPERITONEUM: Fluid/Air: No free air or free fluid is present. Hernia: Minimal periumbilical hernia containing fat. Vascular: Calcification of the abdominal aorta. No aneurysm. Lymph Nodes: No retroperitoneal lymphadenopathy. VASCULATURE: Calcification of the abdominal aorta. No aneurysm. ABDOMINAL AND PELVIS LYMPH NODES: No pelvic lymphadenopathy. No retroperitoneal lymphadenopathy. BONES AND SOFT TISSUES: Bones: Degenerative changes in the spine and hips. No acute bony abnormalities. Soft Tissues: No focal soft tissue abnormality. IMPRESSION: 1. No evidence of acute traumatic injury. Electronically signed by: Elsie Gravely MD 09/12/2024 04:08 PM EST RP Workstation: [id number]   CT Maxillofacial Wo Contrast Result Date: 09/12/2024 CLINICAL DATA:  Blunt facial trauma off toilet EXAM: CT MAXILLOFACIAL WITHOUT CONTRAST TECHNIQUE: Multidetector CT imaging of the maxillofacial structures was performed. Multiplanar CT image reconstructions were also generated. RADIATION DOSE REDUCTION: This exam was performed according to the departmental dose-optimization program which includes automated exposure control, adjustment of the mA and/or kV according to patient size and/or use of iterative reconstruction technique. COMPARISON:  None recent FINDINGS: Osseous: Fluid within the left mastoid air  cells. Mandibular heads are normally position. No mandibular fracture. Pterygoid plates and zygomatic arches are intact. No acute nasal bone fracture. Orbits: Negative. No traumatic or inflammatory finding. Sinuses: Mucous retention cysts and mucosal thickening. No fluid levels. No sinus wall fracture Soft tissues: Negative. Limited intracranial: See separately dictated head CT IMPRESSION: 1. No CT evidence for acute facial bone fracture. 2. Left mastoid effusion Electronically Signed   By: Luke Bun M.D.   On: 09/12/2024 16:05   CT Cervical Spine Wo Contrast Result Date: 09/12/2024 CLINICAL DATA:  Toilet EXAM: CT CERVICAL SPINE WITHOUT CONTRAST TECHNIQUE: Multidetector CT imaging of the cervical spine was performed without intravenous contrast. Multiplanar CT image reconstructions were also generated. RADIATION DOSE REDUCTION: This exam was performed according to the departmental dose-optimization program which includes automated exposure control, adjustment of the mA and/or kV according to patient size and/or use of iterative reconstruction technique. COMPARISON:  None Available. FINDINGS: Alignment: Straightening of the cervical spine. No subluxation. Facet is normal Skull base and vertebrae: No acute fracture.  No primary bone lesion or focal pathologic process. Soft tissues and spinal canal: No prevertebral fluid or swelling. No visible canal hematoma. Disc levels: Diffuse degenerative changes. Mild to moderate diffuse disc space narrowing C3 through C7. Multilevel facet degenerative change with foraminal narrowing. Advanced C1-C2 degenerative change with prominent partially calcified pannus. Narrowing of the canal at the C1-C2 level due to calcified pannus. Posterior disc osteophyte at multiple levels, results in at least mild canal stenosis at C3-C4, C4-C5, moderate stenosis at C5-C6, and moderate severe canal stenosis at C6-C7. Upper chest: Lung apices are clear Other: None IMPRESSION: 1. Straightening  of the cervical spine with diffuse degenerative changes. No acute osseous abnormality. Electronically Signed   By: Luke Bun M.D.   On: 09/12/2024 16:02   CT Head Wo Contrast Result Date: 09/12/2024 CLINICAL DATA:  Head trauma fell off toilet EXAM: CT HEAD WITHOUT CONTRAST TECHNIQUE: Contiguous axial images were obtained from the base of the skull through the vertex without intravenous contrast. RADIATION DOSE REDUCTION: This exam was performed according to the departmental dose-optimization program which includes automated exposure control, adjustment of the mA and/or kV according to patient size and/or use of iterative reconstruction technique. COMPARISON:  MRI brain 10/30/2020, CT head 01/15/2018 FINDINGS: Brain: No acute territorial infarction, hemorrhage or intracranial mass. 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 The ventricles are nonenlarged Vascular: No hyperdense vessels.  No unexpected calcification Skull: Normal. Negative for fracture or focal lesion. Sinuses/Orbits: No acute finding. Other: None IMPRESSION: 1. No CT evidence for acute intracranial abnormality. 2. 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 Electronically Signed   By: Luke Bun M.D.   On: 09/12/2024 15:54   CT RENAL STONE STUDY Result Date: 09/02/2024 CLINICAL DATA:  Acute kidney injury.  Pain after procedure. EXAM: CT ABDOMEN AND PELVIS WITHOUT CONTRAST TECHNIQUE: Multidetector CT imaging of the abdomen and pelvis was performed following the standard protocol without IV contrast. RADIATION DOSE REDUCTION: This exam was performed according to the departmental dose-optimization program which includes automated exposure control, adjustment of the mA and/or kV according to patient size and/or use of iterative reconstruction technique. COMPARISON:  01/22/2024 FINDINGS: Lower chest: Punctate calcified granuloma at the left lung base. Otherwise, the visualized lung bases  are clear. Hepatobiliary: Normal appearance of the liver and gallbladder. Pancreas: Unremarkable. No pancreatic ductal dilatation or surrounding inflammatory changes. Spleen: Normal in size without focal abnormality. Adrenals/Urinary Tract: Normal adrenal glands. 7 mm stone in left kidney lower pole without hydronephrosis. Moderate to severe right hydronephrosis is new since the prior examination. Dilatation of the proximal right ureter. There is a transition point in the proximal right ureter best seen on image 72, sequence 4. No definite stone at the transition point. Right ureter is decompressed distal to this transition. Perinephric stranding appears chronic. No stones in the right kidney. Normal appearance of the urinary bladder. Stomach/Bowel: No bowel dilatation or obstruction. Colonic diverticula. No acute colonic or bowel inflammation. Normal appearance of the stomach. Vascular/Lymphatic: Aortic atherosclerosis. No enlarged abdominal or pelvic lymph nodes. Reproductive: Enlarged prostate measuring 5.3 cm in transverse dimension. Surgical clips in the scrotum. No acute bone abnormality. Degenerative facet disease in lumbar spine. Other: Mild stranding in the right lower quadrant on image 56/2. No significant free fluid in the abdomen or pelvis. Mild stranding around the proximal right ureter. Negative for free air. Small periumbilical ventral hernias containing fat. Musculoskeletal: No acute bone abnormality. Degenerative facet disease in lumbar spine.  IMPRESSION: 1. Moderate to severe right hydronephrosis with a transition point in the proximal right ureter. No definite stone at the transition point. 2. Nonobstructive left renal calculus. 3. Mild stranding in the right lower quadrant is nonspecific. 4. Aortic Atherosclerosis (ICD10-I70.0). 5. Prostate enlargement. These results will be called to the ordering clinician or representative by the Radiologist Assistant, and communication documented in the PACS  or [employer]. Electronically Signed   By: Juliene Balder M.D.   On: 09/02/2024 09:20    Microbiology: Results for orders placed or performed during the hospital encounter of 09/12/24  Culture, blood (routine x 2)     Status: None   Collection Time: 09/12/24  4:10 PM   Specimen: BLOOD  Result Value Ref Range Status   Specimen Description   Final    BLOOD RIGHT ANTECUBITAL Performed at [facility], [address] [place], [place] [address]    Special Requests   Final    BOTTLES DRAWN AEROBIC AND ANAEROBIC Blood Culture adequate volume Performed at [facility], [address], [place], [place] [address]    Culture   Final    NO GROWTH 5 DAYS Performed at [facility], [address] [place], [place] [address]    Report Status 09/17/2024 FINAL  Final  Culture, blood (routine x 2)     Status: None   Collection Time: 09/12/24  4:10 PM   Specimen: BLOOD  Result Value Ref Range Status   Specimen Description   Final    BLOOD LEFT ANTECUBITAL Performed at [facility], [address], [place], [place] [address]    Special Requests   Final    BOTTLES DRAWN AEROBIC AND ANAEROBIC Blood Culture adequate volume Performed at [facility], [address] [place], [place] [address]    Culture   Final    NO GROWTH 5 DAYS Performed at [facility], [address], [place], [place] [address]    Report Status 09/17/2024 FINAL  Final  Urine Culture     Status: Abnormal   Collection Time: 09/12/24  5:05 PM   Specimen: Urine, Clean Catch  Result Value Ref Range Status   Specimen Description   Final    URINE, CLEAN CATCH Performed at [facility], [address] [place], [place] [address]    Special Requests   Final    NONE Performed at [facility], [address], [place], [place] [address]    Culture >=100,000 COLONIES/mL KLEBSIELLA PNEUMONIAE (A)  Final    Report Status 09/14/2024 FINAL  Final   Organism ID, Bacteria KLEBSIELLA PNEUMONIAE (A)  Final      Susceptibility   Klebsiella pneumoniae - MIC*    AMPICILLIN >=32 RESISTANT Resistant     CEFAZOLIN  (URINE) Value in next row Sensitive      2 SENSITIVEThis is a modified FDA-approved test that has been validated and its performance characteristics determined by the reporting laboratory.  This laboratory is certified under the Clinical Laboratory Improvement Amendments CLIA as qualified to perform high complexity clinical laboratory testing.    CEFEPIME  Value in next row Sensitive      2 SENSITIVEThis is a modified FDA-approved test that has been validated and its performance characteristics determined by the reporting laboratory.  This laboratory is certified under the Clinical Laboratory Improvement Amendments CLIA as qualified to perform high complexity clinical laboratory testing.    ERTAPENEM Value in next row Sensitive  2 SENSITIVEThis is a modified FDA-approved test that has been validated and its performance characteristics determined by the reporting laboratory.  This laboratory is certified under the Clinical Laboratory Improvement Amendments CLIA as qualified to perform high complexity clinical laboratory testing.    CEFTRIAXONE  Value in next row Sensitive      2 SENSITIVEThis is a modified FDA-approved test that has been validated and its performance characteristics determined by the reporting laboratory.  This laboratory is certified under the Clinical Laboratory Improvement Amendments CLIA as qualified to perform high complexity clinical laboratory testing.    CIPROFLOXACIN  Value in next row Sensitive      2 SENSITIVEThis is a modified FDA-approved test that has been validated and its performance characteristics determined by the reporting laboratory.  This laboratory is certified under the Clinical Laboratory Improvement Amendments CLIA as qualified to perform high complexity  clinical laboratory testing.    GENTAMICIN Value in next row Sensitive      2 SENSITIVEThis is a modified FDA-approved test that has been validated and its performance characteristics determined by the reporting laboratory.  This laboratory is certified under the Clinical Laboratory Improvement Amendments CLIA as qualified to perform high complexity clinical laboratory testing.    NITROFURANTOIN  Value in next row Intermediate      2 SENSITIVEThis is a modified FDA-approved test that has been validated and its performance characteristics determined by the reporting laboratory.  This laboratory is certified under the Clinical Laboratory Improvement Amendments CLIA as qualified to perform high complexity clinical laboratory testing.    TRIMETH/SULFA Value in next row Sensitive      2 SENSITIVEThis is a modified FDA-approved test that has been validated and its performance characteristics determined by the reporting laboratory.  This laboratory is certified under the Clinical Laboratory Improvement Amendments CLIA as qualified to perform high complexity clinical laboratory testing.    AMPICILLIN/SULBACTAM Value in next row Sensitive      2 SENSITIVEThis is a modified FDA-approved test that has been validated and its performance characteristics determined by the reporting laboratory.  This laboratory is certified under the Clinical Laboratory Improvement Amendments CLIA as qualified to perform high complexity clinical laboratory testing.    PIP/TAZO Value in next row Sensitive      <=4 SENSITIVEThis is a modified FDA-approved test that has been validated and its performance characteristics determined by the reporting laboratory.  This laboratory is certified under the Clinical Laboratory Improvement Amendments CLIA as qualified to perform high complexity clinical laboratory testing.    MEROPENEM Value in next row Sensitive      <=4 SENSITIVEThis is a modified FDA-approved test that has been validated and its  performance characteristics determined by the reporting laboratory.  This laboratory is certified under the Clinical Laboratory Improvement Amendments CLIA as qualified to perform high complexity clinical laboratory testing.    * >=100,000 COLONIES/mL KLEBSIELLA PNEUMONIAE    Labs: CBC: Recent Labs  Lab 09/12/24 1440 09/13/24 0546 09/14/24 1035 09/15/24 0530 09/16/24 0714 09/17/24 0706  WBC 20.4* 18.3* 13.7* 9.9 7.6 7.6  NEUTROABS 19.4*  --  12.0* 8.2*  --   --   HGB 10.8* 9.8* 8.6* 8.1* 8.7* 8.4*  HCT 33.9* 30.2* 26.6* 24.9* 27.0* 27.1*  MCV 93.9 92.6 92.7 92.9 93.1 94.8  PLT 277 201 174 162 157 157   Basic Metabolic Panel: Recent Labs  Lab 09/13/24 1559 09/14/24 1035 09/15/24 0530 09/16/24 0714 09/17/24 0706  NA 136 142 141 144 143  K 5.3* 4.6 4.3 4.2 4.3  CL 107 111 111 113* 111  CO2 21* 23 21* 22 24  GLUCOSE 139* 97 101* 86 89  BUN 50* 47* 38* 31* 25*  CREATININE 2.84* 2.33* 2.13* 1.83* 1.69*  CALCIUM  9.1 9.0 9.0 9.1 9.0   Liver Function Tests: Recent Labs  Lab 09/12/24 1440 09/13/24 0546  AST 25 69*  ALT 22 30  ALKPHOS 65 61  BILITOT 0.3 0.4  PROT 6.7 6.6  ALBUMIN 3.4* 3.2*   CBG: Recent Labs  Lab 09/13/24 1657  GLUCAP 126*    Discharge time spent: greater than 30 minutes.  Signed: Elidia Toribio Furnace, MD [facility] 09/17/2024 "

## 2024-09-17 NOTE — Hospital Course (Addendum)
 Mr. Guzzo was admitted to the hospital with the working diagnosis of complicated urinary tract infection with hydronephrosis and ureteral calculus obstructive uropathy.   84 yo male with the past medical history of osteoarthritis, benign prostate hypertrophy, hyperlipidemia, hypertension, atrial fibrillation and history of TIA who presented with after a mechanical fall.  Patient fell from the toilet, hitting his head on the right side and sustaining a laceration.  EMS was called and he was transported to the ED.  Recent lithotripsy and ureteral stent placement, foley catheter in place.  On his initial physical examination his blood pressure was 126/110, 139/57, HR 115, RR 20 and 02 saturation 96% on room air Lungs with no wheezing or rhonchi, heart with S1 and S2 present and regular, tachycardic, abdomen with no distention and no lower extremity edema. Positive contusion and laceration in his right frontal area.   Na 142, K 5.1 Cl 113 bicarbonate 17 glucose 94 bun 41 cr 2,89  AST 25 ALT 22  Wbc 20,0 hgb 10.8 plt 277  Urine analysis SG 1,012, protein negative, positive nitrites, large leukocytes and small hgb, rbc 11-20 wbc >50  Blood cultures with no growth  Urine culture positive for Klebsiella >100,000 CFU, sensitive to cephalosporins and resistant to ampicillin   CT head with no evidence of intracranial abnormality Atrophy and chronic small vessel ischemic changes of the white matter.  CT cervical spine with straightening of the cervical spine with diffuse degenerative changes. No acute osseous abnormality.  CT maxillofacial with no evidence of acute facial bone fracture.  Left mastoid effusion  CT chest abdomen and pelvis with no evidence of acute traumatic injury   EKG 110 bpm, normal axis, normal intervals, qtc 470, sinus rhythm with small q wave lead II, III, aVF, no significant ST segment or T wave changes, positive LVH.   Patient was placed on IV antibiotic therapy  Renal US  with  moderate right hydronephrosis, similar to prior examinations. No calyceal stone or mass identified.  5 mm shading stone in the inferior pole of the left kidney, no left hydronephrosis of mass.   Urology consulted, removed foley with good toleration.  01/31 plan for follow up as outpatient.

## 2024-09-17 NOTE — Progress Notes (Signed)
 Discharge meds in a secure bag delivered to inpatient pharmacy by this RN.

## 2024-09-17 NOTE — Assessment & Plan Note (Signed)
 Continue aspirin   Patient not on direct oral anticoagulation  Continue rate control with metoprolol .

## 2024-09-17 NOTE — Assessment & Plan Note (Addendum)
 AKI, hyperkalemia (baseline serum cr at 1,4)  Renal function with peaked serum cr at 3.3    At the time of his discharge his renal function continue to improve with serum cr at 1,69 with K at 4,3 and serum bicarbonate at 25 and Na 143   Plan to follow up renal function and electrolytes as outpatient  Avoid nephrotoxic medications

## 2024-09-17 NOTE — Progress Notes (Signed)
 Discharge meds returned from inpatient pharmacy and delivered to patient in room. AVS reviewed with patient who verbalized an understanding. No other questions at this time. Additional scopolamine  info added to the AVS to include time and date of removal (09/19/24 at 10 pm). Patient dressed for discharge to home.

## 2024-09-17 NOTE — TOC Transition Note (Signed)
 Transition of Care [facility]) - Discharge Note   Patient Details  Name: Edward [name] MRN: [id number] Date of Birth: 12-Oct-1940  Transition [facility] Care [facility] CM/SW Contact:  Sonda Manuella Quill, RN Phone Number: 09/17/2024, 1:19 PM   Clinical Narrative:    D/C orders received; wife will provide transportation; [facility] at [employer] given notification of  d/c; no IP CM needs.   Final next level of care: Home w Home Health Services Barriers to Discharge: No Barriers Identified   Patient Goals and CMS Choice Patient states their goals for this hospitalization and ongoing recovery are:: Home with [facility] CMS Medicare.gov Compare Post Acute Care list provided to:: Patient Choice offered to / list presented to : Patient [place] ownership interest in [facility].provided to:: Patient    Discharge Placement                  Name of family member notified: [name] (spouse) [phone] Patient and family notified of of transfer: 09/17/24  Discharge Plan and Services Additional resources added to the After Visit Summary for   In-house Referral: NA Discharge Planning Services: CM Consult            DME Arranged: N/A DME Agency: NA       HH Arranged: NA HH Agency: NA Date HH Agency Contacted: 09/16/24 Time HH Agency Contacted: 1212 Representative spoke with at [facility] Agency: [place]  Social Drivers of Health (SDOH) Interventions SDOH Screenings   Food Insecurity: No Food Insecurity (09/12/2024)  Housing: Low Risk (09/12/2024)  Transportation Needs: No Transportation Needs (09/12/2024)  Utilities: Not At Risk (09/12/2024)  Social Connections: Socially Integrated (09/12/2024)  Tobacco Use: Medium Risk (09/12/2024)     Readmission Risk Interventions    09/14/2024    2:24 PM  Readmission Risk Prevention Plan  Post Dischage Appt Complete  Medication Screening Complete  Transportation Screening Complete

## 2024-09-17 NOTE — Assessment & Plan Note (Addendum)
 Sepsis present on admission, now resolved.  Urine infection related to foley catheter present on admission. Right hydronephrosis, BPH, urethral stricture.   Urine culture positive for Klebsiella pneumonia sensitive to cephalosporins Patient was placed initially on broad spectrum antibiotic with cefepime  then transitioned to cefazolin .  Urology was consulted foley catheter was removed with good toleration  Plan for follow up with Urology on Feb 9 for renal US  in the office.  Continue with finesteride and tamsulosin    Initially planned for cystoscopy and right ureteral stent placement, but after discussion with patient and his wife they opted for more conservative approach.

## 2024-09-17 NOTE — Progress Notes (Signed)
 S: Pt c/o some urge incontinence, mainly due to trying to get out of bed to bathroom with walker, etc. Good stream. No dysuria. No flank or bladder pain. Feels like he's emptying.   O: Vitals:   09/16/24 2005 09/17/24 0436  BP: (!) 154/56 (!) 146/52  Pulse: 79 60  Resp: 18 18  Temp: 98.9 F (37.2 C) 98.6 F (37 C)  SpO2: 99% 94%   Looks well. Standing in bathroom. Helped to bed to eat breakfast.  CBC    Component Value Date/Time   WBC 7.6 09/17/2024 0706   RBC 2.86 (L) 09/17/2024 0706   HGB 8.4 (L) 09/17/2024 0706   HGB 11.2 (L) 02/28/2022 1245   HCT 27.1 (L) 09/17/2024 0706   HCT 34.1 (L) 02/28/2022 1245   PLT 157 09/17/2024 0706   PLT 176 02/28/2022 1245   MCV 94.8 09/17/2024 0706   MCV 93 02/28/2022 1245   MCH 29.4 09/17/2024 0706   MCHC 31.0 09/17/2024 0706   RDW 14.1 09/17/2024 0706   RDW 12.4 02/28/2022 1245   LYMPHSABS 0.8 09/15/2024 0530   LYMPHSABS 1.2 02/28/2022 1245   MONOABS 0.5 09/15/2024 0530   EOSABS 0.3 09/15/2024 0530   EOSABS 0.3 02/28/2022 1245   BASOSABS 0.0 09/15/2024 0530   BASOSABS 0.1 02/28/2022 1245   Lab Results  Component Value Date   CREATININE 1.69 (H) 09/17/2024   CREATININE 1.83 (H) 09/16/2024   CREATININE 2.13 (H) 09/15/2024   A/P; S/p bilateral URS in DEC 2025 - persistent right hydro could be related to reflux, bph/j hooked, or edema/stricture at prior stone site. Cr continues to improve.   -continue to monitor -OK to d/c from [facility] pt of view on po abx -pt has f/u Feb 9 for renal US  in office -appreciate excellent hospitalist care

## 2024-09-17 NOTE — Assessment & Plan Note (Signed)
 Continue with statin therapy

## 2024-09-22 NOTE — Progress Notes (Unsigned)
 "   Cardiology Office Note Date:  09/22/2024   ID:  Edward [name] 1941-04-11, MRN [id number]  PCP:  Janey Santos, MD  Cardiologist:  Tramain Gershman MD  No chief complaint on file.  History of Present Illness: Edward [name] is a 84 y.o. male who is seen for evaluation of CAD.   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  His evaluation included an echocardiogram, carotid ultrasound, CT and MRI studies of the brain.  All of these were unrevealing.  There was no evidence of cortical infarction on his neuroimaging studies.  He had no arrhythmia documented.  His  TEE demonstrated a small PFO. He was seen by Dr. Wonda for consideration of PFO closure but after discussion with Neurology this was not felt to be indication. He subsequently had an implantable loop recorder placed by Dr Edward [name]. He had a single  episode of AFib and he was started on Eliquis . 2 days after starting Eliquis  he developed a hematoma in his left shoulder. Eliquis  was reduced for 2 weeks then increase back to 5 mg bid. Plavix  was stoppped.   He has a history of CAD with remote angioplasty of the LAD in 1995.  He really has never had recurrent angina. He does have  a history of vertigo. Last loop recorder check on Dec 29 showed only one episode of Afib lasting minutes.  He was seen in January with transient confusion of unclear etiology. Carotid dopplers showed no significant stenosis. Seen by Dr Edward [name]. EEG on 10/15/2020 was normal.  MRI of brain without contrast on 10/30/2020  showed moderate  Cerebral chronic small vessel ischemic changes but no stroke or other acute intracranial abnormalities. We discussed his PFO but given uncertainty regarding diagnosis did not feel that closure of PFO was indicated.   He was then considered for a Watchman device in order not to have to be on anticoagulation. He was seen by Dr Edward [name] and underwent left atrial appendage  occlusive device placement with Watchman FLX 27mm.    On 05/17/23 he presented to the ED for elevated heart rate after mowing the lawn for about 1 hour, he noted increasing fatigue. He checked his pulse ox monitor and his heart rate was increased into the 120s.  Event monitor was done as OP. This showed Afib burden < 1%. NSR. Frequent PACs with multiple brief runs of SVT. Rare NSVT 5 beats.   More recent Echo 08/26/23 showed mod to severe AI but improved EF to 50-55%  He was recently admitted 1/26-1/31 with complicated UTI and sepsis with ureteral stone and hydronephrosis.   He is seen today. He states he has no enthusiasm to do things. Has insomnia and frequent nocturia. Has had recurrent UTIs. No energy. BP has been running higher 150-155 systolic. No dyspnea or chest pain. No significant palpitations.     Past Medical History:  Diagnosis Date   Arthritis    right hand,  left foot   At risk for sleep apnea    STOP-BANG = 4    SENT TO PCP 02-10-215   BPH (benign prostatic hypertrophy)    Coronary artery disease CARIOLOGIST-- DR BLANCA   ANGIOPLASTY TO LAD   COVID 2022   mild case   Dysrhythmia    A-fib   Elevated PSA    Frequency of urination    History of kidney stones    Hyperlipidemia    Hypertension  Nocturia    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 Cindie   Spermatocele    left   Stroke [facility])    TIA's   Urgency of urination    Vertigo     Past Surgical History:  Procedure Laterality Date   BALLOON DILATION N/A 07/22/2022   Procedure: ROSEBUD BALLOON DILATION;  Surgeon: Edward Cough, MD;  Location: [facility];  Service: Urology;  Laterality: N/A;  60 MINS   CORONARY ANGIOPLASTY  1995  DR Aairah Negrette   LAD   CYSTOSCOPY N/A 09/18/2015   Procedure: CYSTOSCOPY;  Surgeon: [name] Nieves, MD;  Location: [facility];  Service: Urology;  Laterality: N/A;   CYSTOSCOPY WITH RETROGRADE PYELOGRAM, URETEROSCOPY AND  STENT PLACEMENT Left 03/01/2013   Procedure: CYSTOSCOPY WITH LEFT RETROGRADE PYELOGRAM, LEFT URETEROSCOPY and stent PLACEMENT;  Surgeon: [name] Gwenyth Nieves, MD;  Location: [facility];  Service: Urology;  Laterality: Left;   CYSTOSCOPY/URETEROSCOPY/HOLMIUM LASER/STENT PLACEMENT Bilateral 08/05/2024   Procedure: CYSTOSCOPY/URETEROSCOPY/HOLMIUM LASER/STENT PLACEMENT/URETHRAL DILATION;  Surgeon: Edward Cough, MD;  Location: [facility];  Service: Urology;  Laterality: Bilateral;   EYE SURGERY Left    cataract surgery   FOOT FUSION Left    SECONDARY TO FX'S   GREEN LIGHT LASER TURP (TRANSURETHRAL RESECTION OF PROSTATE N/A 10/04/2013   Procedure: GREEN LIGHT LASER TURP (TRANSURETHRAL RESECTION OF PROSTATE;  Surgeon: [name] Gwenyth Nieves, MD;  Location: [facility];  Service: Urology;  Laterality: N/A;   INGUINAL HERNIA REPAIR Bilateral    LEFT ATRIAL APPENDAGE OCCLUSION N/A 01/09/2022   Procedure: LEFT ATRIAL APPENDAGE OCCLUSION;  Surgeon: Edward [name] Ole DASEN, MD;  Location: [facility];  Service: Cardiovascular;  Laterality: N/A;   LOOP RECORDER INSERTION N/A 04/20/2018   Procedure: LOOP RECORDER INSERTION;  Surgeon: Edward [name] Danelle ORN, MD;  Location: [facility];  Service: Cardiovascular;  Laterality: N/A;   SHOULDER ARTHROSCOPY Right 08/18/2002   SHOULDER ARTHROSCOPY WITH DEBRIDEMENT AND BICEP TENDON REPAIR Left 04/28/2011   SPERMATOCELECTOMY Left 10/04/2013   Procedure: LEFT SPERMATOCELECTOMY;  Surgeon: [name] Gwenyth Nieves, MD;  Location: [facility];  Service: Urology;  Laterality: Left;   TEE WITHOUT CARDIOVERSION N/A 02/16/2018   Procedure: TRANSESOPHAGEAL ECHOCARDIOGRAM (TEE);  Surgeon: Edward Vinie BROCKS, MD;  Location: [name] ENDOSCOPY;  Service: Cardiovascular;  Laterality: N/A;   TEE WITHOUT CARDIOVERSION N/A 01/09/2022   Procedure: TRANSESOPHAGEAL ECHOCARDIOGRAM (TEE);  Surgeon: Edward [name] Ole DASEN, MD;  Location[facility] INVASIVE CV LAB;  Service: Cardiovascular;   Laterality: N/A;   TRANSURETHRAL RESECTION OF PROSTATE N/A 12/11/2014   Procedure: CYSTOSCOPY, CLOT EVACUATION, WITH FULGURATION ;  Surgeon: Norleen Seltzer, MD;  Location: [facility];  Service: Urology;  Laterality: N/A;   TRANSURETHRAL RESECTION OF PROSTATE N/A 09/18/2015   Procedure: TRANSURETHRAL RESECTION OF THE PROSTATE (TURP);  Surgeon: [name] Nieves, MD;  Location: [facility];  Service: Urology;  Laterality: N/A;   UMBILICAL HERNIA REPAIR  06/02/2008   UMBILICAL HERNIA REPAIR N/A 11/12/2021   Procedure: UMBILICAL HERNIA REPAIR;  Surgeon: Vernetta Berg, MD;  Location: [facility];  Service: General;  Laterality: N/A;  LMA    Current Outpatient Medications  Medication Sig Dispense Refill   amLODipine  (NORVASC ) 2.5 MG tablet Take 2.5 mg by mouth every evening.     aspirin  EC 81 MG tablet Take 81 mg by mouth daily. Swallow whole.     cefadroxil  (DURICEF) 500 MG capsule Take 1 capsule (500 mg total) by mouth 2 (two) times daily for 5 days. 10  capsule 0   Coenzyme Q10 (COQ-10) 200 MG CAPS Take 200 mg by mouth in the morning.     finasteride  (PROSCAR ) 5 MG tablet Take 5 mg by mouth every evening.     losartan  (COZAAR ) 100 MG tablet Take 1 tablet (100 mg total) by mouth daily. (Patient taking differently: Take 100 mg by mouth daily in the afternoon.) 90 tablet 2   metoprolol  tartrate (LOPRESSOR ) 25 MG tablet Take 1 tab twice daily as needed only for heart rate greater than 100 beats per minute. 180 tablet 3   rosuvastatin  (CRESTOR ) 20 MG tablet Take 1 tablet (20 mg total) by mouth every evening. 90 tablet 3   tamsulosin  (FLOMAX ) 0.4 MG CAPS capsule Take 0.4 mg by mouth every evening.     TYLENOL  500 MG tablet Take 500 mg by mouth See admin instructions. Take 500 mg by mouth in the evening and an additional 500 mg at noontime as needed for discomfort     No current facility-administered medications for this visit.    Allergies:   Augmentin [amoxicillin-pot clavulanate], Codeine, Levaquin [levofloxacin],  and Zocor [simvastatin]   Social History:  The patient  reports that he quit smoking about 61 years ago. His smoking use included cigarettes. He started smoking about 64 years ago. He has a 1.5 pack-year smoking history. He has never been exposed to tobacco smoke. He has never used smokeless tobacco. He reports that he does not currently use alcohol. He reports that he does not use drugs.   Family History:  The patient's family history is not on file.    ROS:  Please see the history of present illness.  All other systems are reviewed and negative.    PHYSICAL EXAM: VS:  There were no vitals taken for this visit. , BMI There is no height or weight on file to calculate BMI. GEN: Well nourished, well developed, in no acute distress  HEENT: normal  Neck: no JVD, no masses. No carotid bruits Cardiac: RRR  No gallop. There is a gr 2-3/6 holo-diastolic murmur LSB to apex.        Respiratory:  clear to auscultation bilaterally, normal work of breathing GI: soft, nontender, nondistended, + BS MS: no deformity or atrophy. There is a large mature bruise in the left shoulder and upper arm. Ext: no pretibial edema, pedal pulses 2+= bilaterally Skin: warm and dry, no rash Neuro:  Strength and sensation are intact Psych: euthymic mood, full affect  EKG:  EKG is not ordered today.      Recent Labs: 09/13/2024: ALT 30 09/17/2024: BUN 25; Creatinine, Ser 1.69; Hemoglobin 8.4; Platelets 157; Potassium 4.3; Sodium 143   Lipid Panel     Component Value Date/Time   CHOL 117 01/15/2018 0453   TRIG 64 01/15/2018 0453   HDL 41 01/15/2018 0453   CHOLHDL 2.9 01/15/2018 0453   VLDL 13 01/15/2018 0453   LDLCALC 63 01/15/2018 0453   Dated 04/09/18: cholesterol 134, triglycerides 87, HDL 45, LDL 72. CBC, chemistries, TSH normal. Dated 04/21/19: cholesterol 118, triglycerides 66, HDL 40, LDL 65. CMET, CBC, TSH normal Dated 04/25/20: cholesterol 120, triglycerides 46, HDL 44, LDL 67.  Dated 09/24/20: normal  CMET Dated 02/24/22: cholesterol 111, triglycerides 79, HDL 47, LDL 49. CMET and TSH normal Dated 04/15/22: Hgb 11.2.  Dated 04/02/23: cholesterol 127, triglycerides 62, HDL 52, LDL 63.   Wt Readings from Last 3 Encounters:  09/12/24 148 lb (67.1 kg)  08/05/24 148 lb (67.1 kg)  07/27/24 148  lb (67.1 kg)     Cardiac Studies Reviewed: TEE 02-16-2018: Study Conclusions   - Left ventricle: The cavity size was normal. There was mild   concentric hypertrophy. Systolic function was normal. The   estimated ejection fraction was in the range of 55% to 60%. Wall   motion was normal; there were no regional wall motion   abnormalities. - Aortic valve: Trileaflet. Mild to moderate eccentric AI, 2 jets,   the larger jet is toward the anterior mitral valve leaflet. - Aorta: Dilated sinus of valsalva to 4.1 cm. - Left atrium: Mildly dilated. No evidence of thrombus in the   atrial cavity or appendage. - Right atrium: No evidence of thrombus in the atrial cavity or   appendage. - Atrial septum: There is a small PFO by saline microbubble   contrast which demonstrates intermittent right to left flow.   Impressions:   - Small PFO with intermittent right to left flow noted by saline   microbubble contrast. This can be a possible mechanism for his   TIA. Consider referral to Dr. Wonda with the structural heart   clinic for closure evaluation.  Echo 01/02/23:  IMPRESSIONS     1. Left ventricular ejection fraction, by estimation, is 40 to 45%. The  left ventricle has mildly decreased function. The left ventricle  demonstrates global hypokinesis. The left ventricular internal cavity size  was mildly dilated. Left ventricular  diastolic parameters are consistent with Grade II diastolic dysfunction  (pseudonormalization).   2. Right ventricular systolic function is normal. The right ventricular  size is normal.   3. Left atrial size was severely dilated.   4. No obvious residual ASD from Watchman  procedure noted on TTE.   5. Right atrial size was moderately dilated.   6. The mitral valve is normal in structure. Mild mitral valve  regurgitation. No evidence of mitral stenosis.   7. There are several jets of AI overall moderate to severe. . The aortic  valve is tricuspid. 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 No  aortic stenosis is present. Aortic regurgitation PHT measures 376 msec.   8. Aortic dilatation noted. There is borderline dilatation of the aortic  root, measuring 38 mm.   9. The inferior vena cava is normal in size with greater than 50%  respiratory variability, suggesting right atrial pressure of 3 mmHg.   Event monitor 05/18/23: Patch Wear Time:  13 days and 23 hours (2024-10-03T06:14:39-0400 to 2024-10-17T06:14:28-0400)   Patient had a min HR of 32 bpm, max HR of 193 bpm, and avg HR of 71 bpm. Predominant underlying rhythm was Sinus Rhythm. 3 Ventricular Tachycardia runs occurred, the run with the fastest interval lasting 5 beats with a max rate of 187 bpm, the longest  lasting 6 beats with an avg rate of 147 bpm. 2472 Supraventricular Tachycardia runs occurred, the run with the fastest interval lasting 5 beats with a max rate of 193 bpm, the longest lasting 10.3 secs with an avg rate of 137 bpm. Some episodes of  Supraventricular Tachycardia may be possible Atrial Tachycardia with variable block. Atrial Fibrillation occurred (<1% burden), ranging from 116-175 bpm (avg of 145 bpm), the longest lasting 1 min 49 secs with an avg rate of 145 bpm. 1 episode(s) of AV  Block (2nd) occurred, lasting a total of 4 secs. Isolated SVEs were occasional (4.9%, [id number]), SVE Couplets were occasional (1.9%, 13301), and SVE Triplets were occasional (2.2%, 10195). Isolated VEs were rare (<1.0%, 13940), VE Couplets were rare  (<  1.0%, 87), and VE Triplets were rare (<1.0%, 2). Ventricular Bigeminy and Trigeminy were present. Difficulty discerning atrial activity making definitive  diagnosis difficult to ascertain.  Echo 08/26/23: IMPRESSIONS     1. Left ventricular ejection fraction, by estimation, is 50 to 55%. Left  ventricular ejection fraction by 3D volume is 54 %. The left ventricle has  low normal function. The left ventricle has no regional wall motion  abnormalities. Left ventricular  diastolic parameters were normal.   2. Right ventricular systolic function is normal. The right ventricular  size is normal. Tricuspid regurgitation signal is inadequate for assessing  PA pressure.   3. Left atrial size was mildly dilated.   4. The mitral valve is normal in structure. Mild mitral valve  regurgitation.   5. The aortic valve is tricuspid. Aortic valve regurgitation is moderate  to severe. No aortic stenosis is present.   6. The inferior vena cava is normal in size with greater than 50%  respiratory variability, suggesting right atrial pressure of 3 mmHg.   Conclusion(s)/Recommendation(s): AI appears moderate to severe, recommend  cardiac MRI to quantify AI severity.   Assessment/Plan:  1. CAD remote PTCA of the LAD in 1995. No recurrent angina. Continue metoprolol , ASA and statin.  2. Paroxysmal Afib. Noted on loop recorder. Event monitor showed burden < 1% but does have some SVT. Has metoprolol  to take PRN for elevated HT. He is s/p Watchman LAO device so does not need anticoagulation. 3. HLD  on Crestor . Had labs drawn with Dr Janey tody.  4. HTN BP is high. On maximal losartan  dose. Will add amlodipine  2.5 mg daily  5. Transient confusion. Negative Neurologic evaluation.  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 Last Echo showed improvement of LV function.  He is asymptomatic. Continue losartan . Add amlodipine   Will follow up in 6 months.    Signed, Flois Mctague, MD  09/22/2024 4:27 PM    [facility] [address] [place], [place], [place]  [address] Phone: [phone]; Fax: [phone]  "

## 2024-09-26 ENCOUNTER — Ambulatory Visit: Admitting: Cardiology
